# Patient Record
Sex: Male | Born: 1955 | Race: White | Hispanic: No | Marital: Single | Smoking: Current every day smoker
Health system: Southern US, Community
[De-identification: ages and names within clinical notes are randomized; demographics above are authoritative.]

## PROBLEM LIST (undated history)

## (undated) DIAGNOSIS — J449 Chronic obstructive pulmonary disease, unspecified: Secondary | ICD-10-CM

## (undated) DIAGNOSIS — I509 Heart failure, unspecified: Secondary | ICD-10-CM

## (undated) DIAGNOSIS — N189 Chronic kidney disease, unspecified: Secondary | ICD-10-CM

## (undated) DIAGNOSIS — F0390 Unspecified dementia without behavioral disturbance: Secondary | ICD-10-CM

---

## 2015-09-08 ENCOUNTER — Encounter: Payer: Self-pay | Admitting: Emergency Medicine

## 2015-09-08 ENCOUNTER — Emergency Department
Admission: EM | Admit: 2015-09-08 | Discharge: 2015-09-08 | Disposition: A | Discharge status: Home / Self Care | Payer: Self-pay | Attending: Emergency Medicine | Admitting: Emergency Medicine

## 2015-09-08 DIAGNOSIS — R451 Restlessness and agitation: Secondary | ICD-10-CM | POA: Insufficient documentation

## 2015-09-08 DIAGNOSIS — R4183 Borderline intellectual functioning: Secondary | ICD-10-CM

## 2015-09-08 DIAGNOSIS — F4325 Adjustment disorder with mixed disturbance of emotions and conduct: Secondary | ICD-10-CM

## 2015-09-08 DIAGNOSIS — F17210 Nicotine dependence, cigarettes, uncomplicated: Secondary | ICD-10-CM | POA: Insufficient documentation

## 2015-09-08 LAB — CBC
HCT: 37.8 % — ABNORMAL LOW (ref 40.0–52.0)
Hemoglobin: 13.1 g/dL (ref 13.0–18.0)
MCH: 30.9 pg (ref 26.0–34.0)
MCHC: 34.7 g/dL (ref 32.0–36.0)
MCV: 89.1 fL (ref 80.0–100.0)
PLATELETS: 251 10*3/uL (ref 150–440)
RBC: 4.24 MIL/uL — AB (ref 4.40–5.90)
RDW: 14.2 % (ref 11.5–14.5)
WBC: 7.3 10*3/uL (ref 3.8–10.6)

## 2015-09-08 LAB — ETHANOL

## 2015-09-08 LAB — COMPREHENSIVE METABOLIC PANEL
ALT: 11 U/L — AB (ref 17–63)
ANION GAP: 7 (ref 5–15)
AST: 24 U/L (ref 15–41)
Albumin: 4.3 g/dL (ref 3.5–5.0)
Alkaline Phosphatase: 96 U/L (ref 38–126)
BUN: 16 mg/dL (ref 6–20)
CHLORIDE: 99 mmol/L — AB (ref 101–111)
CO2: 29 mmol/L (ref 22–32)
CREATININE: 1.01 mg/dL (ref 0.61–1.24)
Calcium: 9.3 mg/dL (ref 8.9–10.3)
Glucose, Bld: 95 mg/dL (ref 65–99)
Potassium: 3.7 mmol/L (ref 3.5–5.1)
SODIUM: 135 mmol/L (ref 135–145)
Total Bilirubin: 0.7 mg/dL (ref 0.3–1.2)
Total Protein: 7.5 g/dL (ref 6.5–8.1)

## 2015-09-08 LAB — URINE DRUG SCREEN, QUALITATIVE (ARMC ONLY)
Amphetamines, Ur Screen: NOT DETECTED
BARBITURATES, UR SCREEN: NOT DETECTED
Benzodiazepine, Ur Scrn: NOT DETECTED
CANNABINOID 50 NG, UR ~~LOC~~: NOT DETECTED
Cocaine Metabolite,Ur ~~LOC~~: NOT DETECTED
MDMA (ECSTASY) UR SCREEN: NOT DETECTED
METHADONE SCREEN, URINE: NOT DETECTED
Opiate, Ur Screen: NOT DETECTED
Phencyclidine (PCP) Ur S: NOT DETECTED
TRICYCLIC, UR SCREEN: NOT DETECTED

## 2015-09-08 LAB — ACETAMINOPHEN LEVEL

## 2015-09-08 LAB — SALICYLATE LEVEL

## 2015-09-08 NOTE — ED Notes (Signed)
BEHAVIORAL HEALTH ROUNDING Patient sleeping: No. Patient alert and oriented: yes Behavior appropriate: Yes.  ; If no, describe:  Nutrition and fluids offered: yes Toileting and hygiene offered: Yes  Sitter present: q15 minute observations and security  monitoring Law enforcement present: Yes  ODS  
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
## 2015-09-08 NOTE — ED Provider Notes (Signed)
-----------------------------------------   4:03 PM on 09/08/2015 -----------------------------------------  Seen and evaluated by dr. Toni Amendlapacs, of psychiatry, who feels that this time the patient is not a danger to self or others and does not meet criteria for inpatient psychiatric commitment. Patient himself up for to go home. He contracts for safety, has no thoughts of hurting self or others. Given that the psychiatry service feels the patient is safe for discharge and the patient himself commits to not harming himself or others we will discharge him. The family has been contacted and they agree wholeheartedly with this disposition.  Jeanmarie PlantJames A Dail Lerew, MD 09/08/15 [phone]

## 2015-09-08 NOTE — ED Notes (Signed)
Pt given lunch tray.

## 2015-09-08 NOTE — Discharge Instructions (Signed)
If you have thoughts of hurting yourself or anyone else, return to the emergency department.

## 2015-09-08 NOTE — ED Notes (Signed)
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
## 2016-09-05 ENCOUNTER — Emergency Department
Admission: EM | Admit: 2016-09-05 | Discharge: 2016-09-05 | Disposition: A | Discharge status: Home / Self Care | Payer: Self-pay | Attending: Emergency Medicine | Admitting: Emergency Medicine

## 2016-09-05 ENCOUNTER — Encounter: Payer: Self-pay | Admitting: *Deleted

## 2016-09-05 DIAGNOSIS — F4325 Adjustment disorder with mixed disturbance of emotions and conduct: Secondary | ICD-10-CM

## 2016-09-05 DIAGNOSIS — F17210 Nicotine dependence, cigarettes, uncomplicated: Secondary | ICD-10-CM | POA: Insufficient documentation

## 2016-09-05 DIAGNOSIS — R451 Restlessness and agitation: Secondary | ICD-10-CM | POA: Insufficient documentation

## 2016-09-05 DIAGNOSIS — R4183 Borderline intellectual functioning: Secondary | ICD-10-CM

## 2016-09-05 LAB — COMPREHENSIVE METABOLIC PANEL
ALT: 14 U/L — AB (ref 17–63)
AST: 22 U/L (ref 15–41)
Albumin: 4.2 g/dL (ref 3.5–5.0)
Alkaline Phosphatase: 104 U/L (ref 38–126)
Anion gap: 9 (ref 5–15)
BUN: 11 mg/dL (ref 6–20)
CO2: 28 mmol/L (ref 22–32)
Calcium: 9.6 mg/dL (ref 8.9–10.3)
Chloride: 99 mmol/L — ABNORMAL LOW (ref 101–111)
Creatinine, Ser: 0.88 mg/dL (ref 0.61–1.24)
GFR calc Af Amer: >60 mL/min (ref >60)
Glucose, Bld: 114 mg/dL — ABNORMAL HIGH (ref 65–99)
POTASSIUM: 4.5 mmol/L (ref 3.5–5.1)
Sodium: 136 mmol/L (ref 135–145)
TOTAL PROTEIN: 7.7 g/dL (ref 6.5–8.1)
Total Bilirubin: 0.9 mg/dL (ref 0.3–1.2)

## 2016-09-05 LAB — CBC
HCT: 43.4 % (ref 40.0–52.0)
HEMOGLOBIN: 15.1 g/dL (ref 13.0–18.0)
MCH: 31.1 pg (ref 26.0–34.0)
MCHC: 34.8 g/dL (ref 32.0–36.0)
MCV: 89.3 fL (ref 80.0–100.0)
PLATELETS: 311 10*3/uL (ref 150–440)
RBC: 4.87 MIL/uL (ref 4.40–5.90)
RDW: 13.7 % (ref 11.5–14.5)
WBC: 10.6 10*3/uL (ref 3.8–10.6)

## 2016-09-05 LAB — SALICYLATE LEVEL

## 2016-09-05 LAB — ACETAMINOPHEN LEVEL

## 2016-09-05 LAB — ETHANOL: Alcohol, Ethyl (B): <5 mg/dL (ref <5)

## 2016-09-05 NOTE — ED Notes (Signed)

## 2016-09-05 NOTE — ED Notes (Signed)
Dr .Stafford in room with patient 

## 2016-09-05 NOTE — ED Notes (Signed)
Dr.Clapaac in talking with patient

## 2016-09-05 NOTE — ED Notes (Signed)
Lunch was given to patient 
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
## 2016-09-05 NOTE — ED Notes (Signed)
Intake in room talking with patient 
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
## 2016-09-05 NOTE — ED Notes (Signed)
BEHAVIORAL HEALTH ROUNDING Patient sleeping: No. Patient alert and oriented: yes Behavior appropriate: Yes.  ; If no, describe:  Nutrition and fluids offered: yes Toileting and hygiene offered: Yes  Sitter present: q15 minute observations and security  monitoring Law enforcement present: Yes  ODS  

## 2016-10-27 ENCOUNTER — Emergency Department
Admission: EM | Admit: 2016-10-27 | Discharge: 2016-10-28 | Disposition: A | Discharge status: Home / Self Care | Payer: Self-pay | Attending: Emergency Medicine | Admitting: Emergency Medicine

## 2016-10-27 ENCOUNTER — Encounter: Payer: Self-pay | Admitting: Emergency Medicine

## 2016-10-27 DIAGNOSIS — Y999 Unspecified external cause status: Secondary | ICD-10-CM | POA: Insufficient documentation

## 2016-10-27 DIAGNOSIS — F4325 Adjustment disorder with mixed disturbance of emotions and conduct: Secondary | ICD-10-CM | POA: Insufficient documentation

## 2016-10-27 DIAGNOSIS — S2231XA Fracture of one rib, right side, initial encounter for closed fracture: Secondary | ICD-10-CM | POA: Insufficient documentation

## 2016-10-27 DIAGNOSIS — F17210 Nicotine dependence, cigarettes, uncomplicated: Secondary | ICD-10-CM | POA: Insufficient documentation

## 2016-10-27 DIAGNOSIS — R4183 Borderline intellectual functioning: Secondary | ICD-10-CM | POA: Insufficient documentation

## 2016-10-27 DIAGNOSIS — Y939 Activity, unspecified: Secondary | ICD-10-CM | POA: Insufficient documentation

## 2016-10-27 DIAGNOSIS — Y929 Unspecified place or not applicable: Secondary | ICD-10-CM | POA: Insufficient documentation

## 2016-10-27 DIAGNOSIS — Z046 Encounter for general psychiatric examination, requested by authority: Secondary | ICD-10-CM | POA: Insufficient documentation

## 2016-10-27 LAB — URINE DRUG SCREEN, QUALITATIVE (ARMC ONLY)
Amphetamines, Ur Screen: NOT DETECTED
BARBITURATES, UR SCREEN: NOT DETECTED
BENZODIAZEPINE, UR SCRN: NOT DETECTED
CANNABINOID 50 NG, UR ~~LOC~~: NOT DETECTED
Cocaine Metabolite,Ur ~~LOC~~: NOT DETECTED
MDMA (Ecstasy)Ur Screen: NOT DETECTED
Methadone Scn, Ur: NOT DETECTED
Opiate, Ur Screen: NOT DETECTED
PHENCYCLIDINE (PCP) UR S: NOT DETECTED
TRICYCLIC, UR SCREEN: NOT DETECTED

## 2016-10-27 LAB — CBC
HEMATOCRIT: 40.3 % (ref 40.0–52.0)
HEMOGLOBIN: 14 g/dL (ref 13.0–18.0)
MCH: 31.6 pg (ref 26.0–34.0)
MCHC: 34.8 g/dL (ref 32.0–36.0)
MCV: 90.8 fL (ref 80.0–100.0)
Platelets: 251 10*3/uL (ref 150–440)
RBC: 4.44 MIL/uL (ref 4.40–5.90)
RDW: 13.4 % (ref 11.5–14.5)
WBC: 10 10*3/uL (ref 3.8–10.6)

## 2016-10-27 LAB — COMPREHENSIVE METABOLIC PANEL
ALK PHOS: 104 U/L (ref 38–126)
ALT: 36 U/L (ref 17–63)
AST: 55 U/L — AB (ref 15–41)
Albumin: 4.4 g/dL (ref 3.5–5.0)
Anion gap: 8 (ref 5–15)
BILIRUBIN TOTAL: 0.8 mg/dL (ref 0.3–1.2)
BUN: 13 mg/dL (ref 6–20)
CALCIUM: 9.4 mg/dL (ref 8.9–10.3)
CO2: 27 mmol/L (ref 22–32)
CREATININE: 0.98 mg/dL (ref 0.61–1.24)
Chloride: 95 mmol/L — ABNORMAL LOW (ref 101–111)
GFR calc Af Amer: >60 mL/min (ref >60)
Glucose, Bld: 86 mg/dL (ref 65–99)
POTASSIUM: 4 mmol/L (ref 3.5–5.1)
Sodium: 130 mmol/L — ABNORMAL LOW (ref 135–145)
TOTAL PROTEIN: 7.7 g/dL (ref 6.5–8.1)

## 2016-10-27 LAB — URINALYSIS, ROUTINE W REFLEX MICROSCOPIC
BILIRUBIN URINE: NEGATIVE
Glucose, UA: NEGATIVE mg/dL
HGB URINE DIPSTICK: NEGATIVE
KETONES UR: NEGATIVE mg/dL
Leukocytes, UA: NEGATIVE
NITRITE: NEGATIVE
Protein, ur: NEGATIVE mg/dL
Specific Gravity, Urine: 1.008 (ref 1.005–1.030)
pH: 5 (ref 5.0–8.0)

## 2016-10-27 LAB — ETHANOL

## 2016-10-27 NOTE — ED Notes (Addendum)
Dressed out by this RN 1 bag of belongings
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
## 2016-10-28 ENCOUNTER — Emergency Department: Payer: Self-pay

## 2016-10-28 MED ORDER — IBUPROFEN 800 MG PO TABS: 800.0000 mg | ORAL_TABLET | Freq: Once | ORAL | Status: DC

## 2016-10-28 MED ORDER — KETOROLAC TROMETHAMINE 30 MG/ML IJ SOLN
30.0000 mg | Freq: Once | INTRAMUSCULAR | Status: AC
  Administered 2016-10-28: 30 mg via INTRAMUSCULAR
  Filled 2016-10-28: qty 1

## 2016-10-28 MED ORDER — IBUPROFEN 800 MG PO TABS
ORAL_TABLET | ORAL | Status: AC
  Filled 2016-10-28: qty 1

## 2016-10-28 MED ORDER — LORAZEPAM 2 MG/ML IJ SOLN
2.0000 mg | Freq: Once | INTRAMUSCULAR | Status: DC
  Filled 2016-10-28: qty 1

## 2016-10-28 NOTE — ED Notes (Signed)
RN made 2nd attempt to contact pt's brother. Pt's brother will pick him up.
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
## 2016-10-28 NOTE — ED Notes (Signed)
Pt sleeping in bed, ativan IM order held

## 2016-10-28 NOTE — ED Notes (Signed)
Pt given sprite. Pt asked this RN when he could "leave this place". RN informed pt that the telepsych MD would report to our MD.

## 2016-10-28 NOTE — ED Notes (Signed)
Pt agitiated, unable to orient to plan to see Dr Renaldo Fiddlerlapecs and IVC order, declining any sort of comfort measures, sitting upright in bed refuses to have HOB lowered, Dr Dolores FrameSung notified, orders recieved

## 2016-10-28 NOTE — Discharge Instructions (Signed)
Please seek medical attention for any high fevers, chest pain, shortness of breath, change in behavior, persistent vomiting, bloody stool or any other new or concerning symptoms.  

## 2016-10-28 NOTE — ED Notes (Signed)
This RN attempted to contact Pt's bother without success. Pt placed in lobby where he will attempt to contact his brother to pick him up.

## 2016-10-28 NOTE — ED Notes (Signed)
Unable to move pt to BHU only rooms 6,7 and 8 are closed due to smell
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
## 2016-10-28 NOTE — ED Notes (Signed)
Pt verbalized understanding of discharge instructions. NAD at this time. 

## 2016-10-28 NOTE — ED Provider Notes (Signed)
Patient was seen by specialist on call. They do feel he is safe for discharge home. On my exam patient is calm. He denies any SI or HI. Will have the nurse give patient an incentive spirometer.   Phineas SemenGoodman, Doretta Remmert, MD 10/28/16 [phone]

## 2016-10-28 NOTE — Clinical Social Work Note (Signed)
CSW consulted for "possible placement." Patient is independent in all ADL's currently and does not meet criteria for placement in nursing home and he does not have insurance to pay for placement in assisted living or group home. Patient is awaiting an MD psychiatric evaluation and assessment.  York SpanielMonica Lavon Vaughan MSW,LCSW [phone]

## 2016-10-28 NOTE — ED Notes (Addendum)
Pt in hallway staring at staff, this RN asked pt if he needed anything and patient turned his head away from RN.  Pt asked when he was going to see psychiatrist. RN explained that he would be seeing the psych MD shortly but I could not give exact time. Pt stated "I've been here all damn night."

## 2016-12-18 ENCOUNTER — Encounter: Payer: Self-pay | Admitting: Emergency Medicine

## 2016-12-18 ENCOUNTER — Emergency Department
Admission: EM | Admit: 2016-12-18 | Discharge: 2016-12-21 | Disposition: A | Discharge status: Home / Self Care | Payer: Self-pay | Attending: Emergency Medicine | Admitting: Emergency Medicine

## 2016-12-18 DIAGNOSIS — F4325 Adjustment disorder with mixed disturbance of emotions and conduct: Secondary | ICD-10-CM | POA: Diagnosis present

## 2016-12-18 DIAGNOSIS — R4183 Borderline intellectual functioning: Secondary | ICD-10-CM

## 2016-12-18 DIAGNOSIS — R4689 Other symptoms and signs involving appearance and behavior: Secondary | ICD-10-CM

## 2016-12-18 DIAGNOSIS — F17210 Nicotine dependence, cigarettes, uncomplicated: Secondary | ICD-10-CM | POA: Insufficient documentation

## 2016-12-18 DIAGNOSIS — F919 Conduct disorder, unspecified: Secondary | ICD-10-CM | POA: Insufficient documentation

## 2016-12-18 LAB — COMPREHENSIVE METABOLIC PANEL
ALK PHOS: 106 U/L (ref 38–126)
ALT: 21 U/L (ref 17–63)
AST: 31 U/L (ref 15–41)
Albumin: 4.2 g/dL (ref 3.5–5.0)
Anion gap: 8 (ref 5–15)
BUN: 31 mg/dL — ABNORMAL HIGH (ref 6–20)
CALCIUM: 9.2 mg/dL (ref 8.9–10.3)
CO2: 25 mmol/L (ref 22–32)
Chloride: 105 mmol/L (ref 101–111)
Creatinine, Ser: 1.47 mg/dL — ABNORMAL HIGH (ref 0.61–1.24)
GFR, EST AFRICAN AMERICAN: 58 mL/min — AB (ref >60)
GFR, EST NON AFRICAN AMERICAN: 50 mL/min — AB (ref >60)
Glucose, Bld: 77 mg/dL (ref 65–99)
Potassium: 4.1 mmol/L (ref 3.5–5.1)
Sodium: 138 mmol/L (ref 135–145)
TOTAL PROTEIN: 7.2 g/dL (ref 6.5–8.1)
Total Bilirubin: 0.6 mg/dL (ref 0.3–1.2)

## 2016-12-18 LAB — ACETAMINOPHEN LEVEL: Acetaminophen (Tylenol), Serum: <10 ug/mL — ABNORMAL LOW (ref 10–30)

## 2016-12-18 LAB — CBC
HCT: 37.8 % — ABNORMAL LOW (ref 40.0–52.0)
Hemoglobin: 13.2 g/dL (ref 13.0–18.0)
MCH: 31.9 pg (ref 26.0–34.0)
MCHC: 34.9 g/dL (ref 32.0–36.0)
MCV: 91.4 fL (ref 80.0–100.0)
PLATELETS: 245 10*3/uL (ref 150–440)
RBC: 4.13 MIL/uL — ABNORMAL LOW (ref 4.40–5.90)
RDW: 14.2 % (ref 11.5–14.5)
WBC: 9.3 10*3/uL (ref 3.8–10.6)

## 2016-12-18 LAB — SALICYLATE LEVEL

## 2016-12-18 LAB — ETHANOL

## 2016-12-18 MED ORDER — QUETIAPINE FUMARATE 25 MG PO TABS
50.0000 mg | ORAL_TABLET | Freq: Four times a day (QID) | ORAL | Status: DC | PRN
  Administered 2016-12-18 – 2016-12-20 (×2): 50 mg via ORAL
  Filled 2016-12-18 (×2): qty 2
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
## 2016-12-18 NOTE — ED Notes (Signed)

## 2016-12-18 NOTE — ED Notes (Signed)
Received a call from someone stating they are with APS/dss she is verbalizing that the pt does not have anywhere to go and that he is a danger to go back into society  - since she is not his legal guardian I could not give her any information/update  I asked her to call social work - Morrie Sheldonshley to give her a heads up about his placement upon discharge    IVC remains in place per psychiatrist consult note today
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
## 2016-12-18 NOTE — ED Notes (Signed)
Pt observed lying in bed - watching TV   Pt visualized with NAD  No verbalized needs or concerns at this time  Continue to monitor 

## 2016-12-18 NOTE — BH Assessment (Signed)
Tele Assessment Note   Paul Vaughan is an 61 y.o. male presenting involuntarily for assessment. Pt reports he was transported to ED by law enforcement. Pt states he does not know why law enforcement brought him in. Pt states "I had a lighter in my hand and they thought it was a gun". Pt presents as irritable and with profane language. Pt denies SI and h/o suicide attempt. Pt states he is homeless as of today. Pt reports his brother is the reason why he is homeless. Pt states " and he (brother) said I  Can't live with him and his family. He jealous of me and I don't know why. I don't even thing he's my real fucking brother".  Pt denies HI and thoughts of harming others. Pt denies hallucinations and states "No. You think I'm crazy? The doctor said I wasn't crazy".   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 No pertinent past medical history.  History reviewed. No pertinent surgical history.  Family History: No family history on file.  Social History:  reports that he has been smoking Cigarettes.  He has been smoking about 1.00 pack per day. He has never used smokeless tobacco. He reports that he does not drink alcohol or use drugs.  Additional Social History:  Alcohol / Drug Use Pain Medications: Pt denies abuse. Prescriptions: Pt denies abuse. Over the Counter: Pt denies abuse. History of alcohol / drug use?: No history of alcohol / drug abuse  CIWA: CIWA-Ar BP: 116/68 Pulse Rate: 77 COWS:    PATIENT STRENGTHS: (choose at least two) Average or above average intelligence General fund of knowledge  Allergies:  Allergies  Allergen Reactions  . Penicillins Rash    .Has patient had a PCN reaction causing immediate rash, facial/tongue/throat swelling, SOB or lightheadedness with hypotension: Unknown Has patient had a PCN reaction causing severe rash involving mucus membranes or skin necrosis: Unknown Has patient  had a PCN reaction that required hospitalization: Unknown Has patient had a PCN reaction occurring within the last 10 years: Unknown If all of the above answers are "NO", then may proceed with Cephalosporin use.   . Sulfa Antibiotics Rash    Home Medications:  (Not in a hospital admission)  OB/GYN Status:  No LMP for male patient.  General Assessment Data Location of Assessment: [facility] ED TTS Assessment: In system Is this a Tele or Face-to-Face Assessment?: Face-to-Face Is this an Initial Assessment or a Re-assessment for this encounter?: Initial Assessment Marital status: Single Living Arrangements: Other (Comment) (Pt reports he is homeless as of today) Can pt return to current living arrangement?: Yes Admission Status: Involuntary Is patient capable of signing voluntary admission?: No Referral Source: Other Mudlogger(law enforcement) Insurance type: Horticulturist, [employer]     Crisis Care Plan Living Arrangements: Other (Comment) (Pt reports he is homeless as of today) Name of Psychiatrist: None Name of Therapist: None  Education Status Is patient currently in school?: No Name of school: Not Reported  Risk to self with the past 6 months Suicidal Ideation: No Has patient been a risk to self within the past 6 months prior to admission? : No Suicidal Intent: No Has patient had any suicidal intent within the past 6 months prior to admission? : No Is patient at risk for suicide?: No Suicidal Plan?: No Has patient had any suicidal plan within the past 6 months prior to admission? : No Access to Means: No What has been your use of  drugs/alcohol within the last 12 months?: Pt denies drug/alcohol use Previous Attempts/Gestures: No Other Self Harm Risks: None Reported Triggers for Past Attempts: None known Intentional Self Injurious Behavior: None Family Suicide History: No Recent stressful life event(s): Other (Comment), Conflict (Comment) (conflict w/ brother/ homeless) Persecutory  voices/beliefs?: No Depression: No Substance abuse history and/or treatment for substance abuse?: No (Pt denies) Suicide prevention information given to non-admitted patients: Not applicable  Risk to Others within the past 6 months Homicidal Ideation: No Does patient have any lifetime risk of violence toward others beyond the six months prior to admission? : No Thoughts of Harm to Others: No Current Homicidal Intent: No Current Homicidal Plan: No Access to Homicidal Means: No History of harm to others?: No Assessment of Violence: None Noted Does patient have access to weapons?: No Criminal Charges Pending?: No Does patient have a court date: No Is patient on probation?: No  Psychosis Hallucinations: None noted Delusions: None noted  Mental Status Report Appearance/Hygiene: In scrubs Eye Contact: Fair Motor Activity: Unremarkable Speech: Logical/coherent Level of Consciousness: Irritable, Alert Mood: Irritable Affect: Irritable Anxiety Level: Minimal Thought Processes: Relevant Judgement: Partial Orientation: Person, Place, Time, Situation Obsessive Compulsive Thoughts/Behaviors: Minimal  Cognitive Functioning Concentration: Good Memory: Recent Intact, Remote Intact IQ: Average Insight: Poor Impulse Control: Poor Appetite:  (Not Reported) Sleep: No Change Total Hours of Sleep:  (Not Reported) Vegetative Symptoms: None  ADLScreening [facility] Assessment Services) Patient's cognitive ability adequate to safely complete daily activities?: Yes Patient able to express need for assistance with ADLs?: Yes Independently performs ADLs?: Yes (appropriate for developmental age)  Prior Inpatient Therapy Prior Inpatient Therapy: No  Prior Outpatient Therapy Prior Outpatient Therapy: No Does patient have an ACCT team?: No Does patient have Intensive In-House Services?  : No Does patient have [facility] services? : No Does patient have P4CC services?: No  ADL Screening  (condition at time of admission) Patient's cognitive ability adequate to safely complete daily activities?: Yes Is the patient deaf or have difficulty hearing?: No Does the patient have difficulty seeing, even when wearing glasses/contacts?: No Does the patient have difficulty concentrating, remembering, or making decisions?: Yes Patient able to express need for assistance with ADLs?: Yes Does the patient have difficulty dressing or bathing?: No Independently performs ADLs?: Yes (appropriate for developmental age) Does the patient have difficulty walking or climbing stairs?: No Weakness of Legs: None Weakness of Arms/Hands: None  Home Assistive Devices/Equipment Home Assistive Devices/Equipment: None  Therapy Consults (therapy consults require a physician order) PT Evaluation Needed: No OT Evalulation Needed: No SLP Evaluation Needed: No Abuse/Neglect Assessment (Assessment to be complete while patient is alone) Physical Abuse: Denies Verbal Abuse: Denies Sexual Abuse: Denies Exploitation of patient/patient's resources: Denies Self-Neglect: Denies Values / Beliefs Cultural Requests During Hospitalization: None Spiritual Requests During Hospitalization: None Consults Spiritual Care Consult Needed: No Social Work Consult Needed: No Merchant navy officer (For Healthcare) Does Patient Have a Medical Advance Directive?: No Would patient like information on creating a medical advance directive?: No - Patient declined    Additional Information 1:1 In Past 12 Months?: No CIRT Risk: No Elopement Risk: No Does patient have medical clearance?: No     Disposition:  Disposition Initial Assessment Completed for this Encounter: Yes Disposition of Patient: Other dispositions Other disposition(s): Other (Comment) (Pending Psychiatric Recommendation)  Mansi Tokar J [place] 12/18/2016 6:39 PM

## 2016-12-18 NOTE — ED Notes (Signed)
BEHAVIORAL HEALTH ROUNDING Patient sleeping: No. Patient alert and oriented: yes Behavior appropriate: Yes.  ; If no, describe:  Nutrition and fluids offered: yes Toileting and hygiene offered: Yes  Sitter present: q15 minute observations and security  monitoring Law enforcement present: Yes  ODS  

## 2016-12-18 NOTE — ED Notes (Signed)
Pt given warm blanket, supper tray and extra juice.

## 2016-12-19 NOTE — ED Notes (Signed)
BEHAVIORAL HEALTH ROUNDING Patient sleeping: Yes.   Patient alert and oriented: eyes closed  Appears to be asleep Behavior appropriate: Yes.  ; If no, describe:  Nutrition and fluids offered: Yes  Toileting and hygiene offered: sleeping Sitter present: q 15 minute observations and security monitoring Law enforcement present: yes  ODS 

## 2016-12-19 NOTE — ED Notes (Signed)
ED  Is the patient under IVC or is there intent for IVC: Yes.   Is the patient medically cleared: Yes.   Is there vacancy in the ED BHU: Yes.   Is the population mix appropriate for patient: Yes.   Is the patient awaiting placement in inpatient or outpatient setting:  Has the patient had a psychiatric consult:  pending Survey of unit performed for contraband, proper placement and condition of furniture, tampering with fixtures in bathroom, shower, and each patient room: Yes.  ; Findings:  APPEARANCE/BEHAVIOR Calm and cooperative NEURO ASSESSMENT Orientation: oriented x4  Denies pain Hallucinations: No.None noted (Hallucinations) Speech: Normal Gait: normal RESPIRATORY ASSESSMENT Even  Unlabored respirations  CARDIOVASCULAR ASSESSMENT Pulses equal   regular rate  Skin warm and dry   GASTROINTESTINAL ASSESSMENT no GI complaint EXTREMITIES Full ROM  PLAN OF CARE Provide calm/safe environment. Vital signs assessed twice daily. ED BHU Assessment once each 12-hour shift. Collaborate with TTS when available  or as condition indicates. Assure the ED provider has rounded once each shift. Provide and encourage hygiene. Provide redirection as needed. Assess for escalating behavior; address immediately and inform ED provider.  Assess family dynamic and appropriateness for visitation as needed: Yes.  ; If necessary, describe findings:  Educate the patient/family about BHU procedures/visitation: Yes.  ; If necessary, describe findings:   

## 2016-12-19 NOTE — Consult Note (Signed)
[place] [facility] Consult   Reason for Consult:  Consult for 61 year old man with a history of developmental disability brought in by police for agitated behavior Referring Physician:  Cinda Quest Patient Identification: Hulon Ferron MRN:  [id number] Principal Diagnosis: Adjustment disorder with mixed disturbance of emotions and conduct Diagnosis:   Patient Active Problem List   Diagnosis Date Noted  . 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 Adjustment disorder with mixed disturbance of emotions and conduct [F43.25] 09/08/2015    Total Time spent with patient: 1 hour  Subjective:   Cejay Cambre is a 60 y.o. male patient admitted with "I need a damn. Job".  Follow-up note for this 61 year old man with a history of developmental disability. Since yesterday we have been contacted by adult protective services who report that they are involved in trying to seek guardianship. It's still not clear to me why he no longer has his previous residence. I have spoken with social work and TTS and we are still trying to track down information from his brother. Patient has despite his irritability not been any trouble since yesterday. He has been cooperative in the emergency room and not aggressive. On reassessment today he still talks about how he wants me to give him a job but he is less intrusive about it. Affect blunted and dysphoric. Not obviously psychotic. Eating well. Seems to be physically stable.  HPI:  Patient seen chart reviewed. Patient known from previous encounters. 61 year old man brought to the emergency room under IVC by police. Picked up for agitated behavior in public. Reported to of been making threatening gestures at cars and walking out into traffic. On interview the patient tells me that the object he was holding in his hand is a lighter used for lighting charcoal. Denies that he was trying to hurt anyone. Admits that he was walking around in the streets  but denies that he was trying to get hurt. Patient is rather emotionally agitated today even more than I have seen him in the past. Hard to get much of a straight story out of him. He tells me that he no longer has any place to live because the place he has been staying has been condemned. He tells me he has not been sleeping well for days. Denies that he's been on any medicine. Denies drug or alcohol abuse. Patient repeats over and over again and then he needs to be given a job. 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 Aims a little more disorganized than I remember him being in the past. He is covered with green paint which she has used to paint his fingernails. Looks a little bit rougher then I remember him being before.  Social history: Patient does have a legal guardian who is his brother. Last time we interacted with this patient it was confirmed that he lived independently but that his brother took care of his finances for him. Patient is now stating that he no longer has a place to live.  Medical history: He is underweight but he says that he has not been losing or gaining weight. Denies any medical issues. No known medical problems from the past.  Substance abuse history: Denies alcohol or drug abuse does not have a past history of any substance abuse  Past Psychiatric History: Patient has been seen in the past in the emergency room and his primary diagnosis had been developmental disability. He has  a known tendency to walk around in public acting strangely which has gotten him in trouble with the police before. We have not admitted him to the psychiatric hospital in the past for thought that he met commitment criteria. Had not been previously diagnosed with an Axis I psychiatric disorder. Currently seems to be a little more disorganized and bizarre than usual.  Risk to Self: Suicidal Ideation: No Suicidal Intent: No Is patient at risk  for suicide?: No Suicidal Plan?: No Access to Means: No What has been your use of drugs/alcohol within the last 12 months?: Pt denies drug/alcohol use Other Self Harm Risks: None Reported Triggers for Past Attempts: None known Intentional Self Injurious Behavior: None Risk to Others: Homicidal Ideation: No Thoughts of Harm to Others: No Current Homicidal Intent: No Current Homicidal Plan: No Access to Homicidal Means: No History of harm to others?: No Assessment of Violence: None Noted Does patient have access to weapons?: No Criminal Charges Pending?: No Does patient have a court date: No Prior Inpatient Therapy: Prior Inpatient Therapy: No Prior Outpatient Therapy: Prior Outpatient Therapy: No Does patient have an ACCT team?: No Does patient have Intensive In-House Services?  : No Does patient have [facility] services? : No Does patient have P4CC services?: No  Past Medical History: History reviewed. No pertinent past medical history. History reviewed. No pertinent surgical history. Family History: No family history on file. Family Psychiatric  History: Unknown Social History:  History  Alcohol Use No     History  Drug Use No    Social History   Social History  . Marital status: Single    Spouse name: N/A  . Number of children: N/A  . Years of education: N/A   Social History Main Topics  . Smoking status: Current Every Day Smoker    Packs/day: 1.00    Types: Cigarettes  . Smokeless tobacco: Never Used  . Alcohol use No  . Drug use: No  . Sexual activity: Not Asked   Other Topics Concern  . None   Social History Narrative  . None   Additional Social History:    Allergies:   Allergies  Allergen Reactions  . Penicillins Rash    .Has patient had a PCN reaction causing immediate rash, facial/tongue/throat swelling, SOB or lightheadedness with hypotension: Unknown Has patient had a PCN reaction causing severe rash involving mucus membranes or skin necrosis:  Unknown Has patient had a PCN reaction that required hospitalization: Unknown Has patient had a PCN reaction occurring within the last 10 years: Unknown If all of the above answers are "NO", then may proceed with Cephalosporin use.   . Sulfa Antibiotics Rash    Labs:  Results for orders placed or performed during the hospital encounter of 12/18/16 (from the past 48 hour(s))  Comprehensive metabolic panel     Status: Abnormal   Collection Time: 12/18/16  4:40 PM  Result Value Ref Range   Sodium 138 135 - 145 mmol/L   Potassium 4.1 3.5 - 5.1 mmol/L   Chloride 105 101 - 111 mmol/L   CO2 25 22 - 32 mmol/L   Glucose, Bld 77 65 - 99 mg/dL   BUN 31 (H) 6 - 20 mg/dL   Creatinine, Ser 1.47 (H) 0.61 - 1.24 mg/dL   Calcium 9.2 8.9 - 10.3 mg/dL   Total Protein 7.2 6.5 - 8.1 g/dL   Albumin 4.2 3.5 - 5.0 g/dL   AST 31 15 - 41 U/L   ALT 21 17 -  63 U/L   Alkaline Phosphatase 106 38 - 126 U/L   Total Bilirubin 0.6 0.3 - 1.2 mg/dL   GFR calc non Af Amer 50 (L) >60 mL/min   GFR calc Af Amer 58 (L) >60 mL/min    Comment: (NOTE) The eGFR has been calculated using the CKD EPI equation. This calculation has not been validated in all clinical situations. eGFR's persistently <60 mL/min signify possible Chronic Kidney Disease.    Anion gap 8 5 - 15  Ethanol     Status: None   Collection Time: 12/18/16  4:40 PM  Result Value Ref Range   Alcohol, Ethyl (B) <5 <5 mg/dL    Comment:        LOWEST DETECTABLE LIMIT FOR SERUM ALCOHOL IS 5 mg/dL FOR MEDICAL PURPOSES ONLY   Salicylate level     Status: None   Collection Time: 12/18/16  4:40 PM  Result Value Ref Range   Salicylate Lvl <3.5 2.8 - 30.0 mg/dL  Acetaminophen level     Status: Abnormal   Collection Time: 12/18/16  4:40 PM  Result Value Ref Range   Acetaminophen (Tylenol), Serum <10 (L) 10 - 30 ug/mL    Comment:        THERAPEUTIC CONCENTRATIONS VARY SIGNIFICANTLY. A RANGE OF 10-30 ug/mL MAY BE AN EFFECTIVE CONCENTRATION FOR MANY  PATIENTS. HOWEVER, SOME ARE BEST TREATED AT CONCENTRATIONS OUTSIDE THIS RANGE. ACETAMINOPHEN CONCENTRATIONS >150 ug/mL AT 4 HOURS AFTER INGESTION AND >50 ug/mL AT 12 HOURS AFTER INGESTION ARE OFTEN ASSOCIATED WITH TOXIC REACTIONS.   cbc     Status: Abnormal   Collection Time: 12/18/16  4:40 PM  Result Value Ref Range   WBC 9.3 3.8 - 10.6 K/uL   RBC 4.13 (L) 4.40 - 5.90 MIL/uL   Hemoglobin 13.2 13.0 - 18.0 g/dL   HCT 37.8 (L) 40.0 - 52.0 %   MCV 91.4 80.0 - 100.0 fL   MCH 31.9 26.0 - 34.0 pg   MCHC 34.9 32.0 - 36.0 g/dL   RDW 14.2 11.5 - 14.5 %   Platelets 245 150 - 440 K/uL    Current Facility-Administered Medications  Medication Dose Route Frequency Provider Last Rate Last Dose  . QUEtiapine (SEROQUEL) tablet 50 mg  50 mg Oral Q6H PRN Martie Fulgham, Madie Reno, MD   50 mg at 12/18/16 2052   No current outpatient prescriptions on file.    Musculoskeletal: Strength & Muscle Tone: within normal limits Gait & Station: normal Patient leans: N/A  Psychiatric Specialty Exam: Physical Exam  Nursing note and vitals reviewed. Constitutional: He appears well-developed and well-nourished.  HENT:  Head: Normocephalic and atraumatic.  Eyes: Pupils are equal, round, and reactive to light. Conjunctivae are normal.  Neck: Normal range of motion.  Cardiovascular: Regular rhythm and normal heart sounds.   Respiratory: Effort normal. No respiratory distress.  GI: Soft.  Musculoskeletal: Normal range of motion.  Neurological: He is alert.  Skin: Skin is warm and dry.  Psychiatric: His affect is not angry and not labile. His speech is tangential. He is not agitated. Cognition and memory are impaired. He expresses impulsivity and inappropriate judgment. He expresses no homicidal and no suicidal ideation. He exhibits abnormal recent memory.    Review of Systems  Constitutional: Negative.   HENT: Negative.   Eyes: Negative.   Respiratory: Negative.   Cardiovascular: Negative.    Gastrointestinal: Negative.   Musculoskeletal: Negative.   Skin: Negative.   Neurological: Negative.   Psychiatric/Behavioral: Negative for depression, hallucinations, memory loss,  substance abuse and suicidal ideas. The patient has insomnia. The patient is not nervous/anxious.     Blood pressure 109/62, pulse (!) 103, temperature (!) 97.5 F (36.4 C), temperature source Oral, resp. rate 18, height 5' 9"  (1.753 m), weight 59 kg (130 lb), SpO2 97 %.Body mass index is 19.2 kg/m.  General Appearance: Disheveled  Eye Contact:  Fair  Speech:  Slow  Volume:  Normal  Mood:  Euthymic  Affect:  Constricted  Thought Process:  Disorganized  Orientation:  Full (Time, Place, and Person)  Thought Content:  Illogical, Rumination and Tangential  Suicidal Thoughts:  No  Homicidal Thoughts:  No  Memory:  Immediate;   Fair Recent;   Fair Remote;   Fair  Judgement:  Impaired  Insight:  Shallow  Psychomotor Activity:  Normal  Concentration:  Concentration: Poor  Recall:  Poor  Fund of Knowledge:  Poor  Language:  Poor  Akathisia:  No  Handed:  Right  AIMS (if indicated):     Assets:  Financial Resources/Insurance Social Support  ADL's:  Impaired  Cognition:  Impaired,  Mild  Sleep:        Treatment Plan Summary: Medication management and Plan Patient has calm down. He is eating normally sleeping normally. Vitals are stable. Patient mainly needs placement at this point. Unclear what our options will be. Social work is working with Adult Scientist, forensic to try to sort out what the situation is for him. No specific indication for any new medicine. Continue to follow-up as needed. It had been stated to me that Adult Protective Services said that we would not be able to hold him if he did not want to stay here. Right now he is not demanding to go anywhere. Given his inability to care for himself I don't think it would be safer reasonable to let him go out onto the street where he would  inevitably be back in trouble with the law in a short time.. We will work on trying to get appropriate care arranged for him.  Disposition: Supportive therapy provided about ongoing stressors.  Alethia Berthold, MD 12/19/2016 2:34 PM

## 2016-12-19 NOTE — [place] FL2 (Signed)
  [place] MEDICAID FL2 LEVEL OF CARE SCREENING TOOL     IDENTIFICATION  Patient Name: Paul Vaughan Birthdate: 08/24/1955 Sex: male Admission Date (Current Location): 12/18/2016  [place]   Facility and Address:  [facility], [address], [place], [place] [address]      Provider Number: [id number]  Attending Physician Name and Address:  No att. providers found  Relative Name and Phone Number:       Current Level of Care: Hospital Recommended Level of Care: Family Care Home, Other (Comment) (Group Home) Prior Approval Number:    Date Approved/Denied:   PASRR Number:    Discharge Plan: Other (Comment) (Family Care Home; Group Home)    Current Diagnoses: Patient Active Problem List   Diagnosis Date Noted  . 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    Orientation RESPIRATION BLADDER Height & Weight     Self, Place  Normal Continent Weight: 130 lb (59 kg) Height:  5\' 9"  (175.3 cm)  BEHAVIORAL SYMPTOMS/MOOD NEUROLOGICAL BOWEL NUTRITION STATUS      Continent Diet  AMBULATORY STATUS COMMUNICATION OF NEEDS Skin   Independent Verbally Normal                       Personal Care Assistance Level of Assistance  Bathing, Feeding, Dressing Bathing Assistance: Independent Feeding assistance: Independent Dressing Assistance: Independent     Functional Limitations Info  Sight, Speech, Hearing Sight Info: Adequate Hearing Info: Adequate Speech Info: Adequate    SPECIAL CARE FACTORS FREQUENCY                       Contractures Contractures Info: Not present    Additional Factors Info  Allergies, Psychotropic   Allergies Info: Penicillins, Sulfa Antibiotics Psychotropic Info: QUEtiapine (SEROQUEL) tablet 50 mg         Current Medications (12/19/2016):  This is the current hospital active medication  list Current Facility-Administered Medications  Medication Dose Route Frequency Provider Last Rate Last Dose  . QUEtiapine (SEROQUEL) tablet 50 mg  50 mg Oral Q6H PRN Clapacs, Jackquline DenmarkJohn T, MD   50 mg at 12/18/16 2052   No current outpatient prescriptions on file.     Discharge Medications: Please see discharge summary for a list of discharge medications.  Relevant Imaging Results:  Relevant Lab Results:   Additional Information SS #[id number]  Lew DawesAshley N Bralynn Donado, LCSW

## 2016-12-19 NOTE — Progress Notes (Addendum)
FL-2 sent to group homes with Patient's permission and agreement. PASRR submitted and under manual review. CSW continues to follow.   Enos FlingAshley Yotam Rhine, MSW, LCSW [facility] Clinical Social Worker [phone]

## 2016-12-19 NOTE — ED Notes (Signed)
Food tray left in room for the patient, he is currently sleeping.

## 2016-12-19 NOTE — ED Notes (Signed)
BEHAVIORAL HEALTH ROUNDING Patient sleeping: No. Patient alert and oriented: yes Behavior appropriate: Yes.  ; If no, describe:  Nutrition and fluids offered: yes Toileting and hygiene offered: Yes  Sitter present: q15 minute observations and security  monitoring Law enforcement present: Yes  ODS  

## 2016-12-19 NOTE — ED Notes (Signed)

## 2016-12-19 NOTE — ED Notes (Signed)
Patient observed lying in bed with eyes closed  Even, unlabored respirations observed   NAD pt appears to be sleeping  I will continue to monitor along with every 15 minute visual observations and ongoing security monitoring    
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
## 2016-12-19 NOTE — ED Notes (Signed)
PT IVC/ PENDING PLACEMENT  

## 2016-12-19 NOTE — ED Notes (Addendum)
BEHAVIORAL HEALTH ROUNDING Patient sleeping: Yes.   Patient alert and oriented: eyes closed  Appears to be asleep Behavior appropriate: Yes.  ; If no, describe:  Nutrition and fluids offered: Yes  Toileting and hygiene offered: sleeping Sitter present: q 15 minute observations and security monitoring Law enforcement present: yes  ODS 

## 2016-12-19 NOTE — ED Provider Notes (Signed)
-----------------------------------------   3:51 AM on 12/19/2016 -----------------------------------------   Blood pressure 131/70, pulse 65, temperature 98.2 F (36.8 C), temperature source Oral, resp. rate 16, height 5\' 9"  (1.753 m), weight 59 kg (130 lb), SpO2 99 %.  The patient had no acute events since last update.  Calm and cooperative at this time.  Disposition is pending Psychiatry/Behavioral Medicine team recommendations.     Willy Eddyobinson, Fredrica Capano, MD 12/19/16 [phone]

## 2016-12-19 NOTE — ED Notes (Signed)
Pt breakfast tray placed next to bed. Pt sleeping

## 2016-12-20 LAB — URINE DRUG SCREEN, QUALITATIVE (ARMC ONLY)
Amphetamines, Ur Screen: NOT DETECTED
BARBITURATES, UR SCREEN: NOT DETECTED
Benzodiazepine, Ur Scrn: NOT DETECTED
CANNABINOID 50 NG, UR ~~LOC~~: NOT DETECTED
COCAINE METABOLITE, UR ~~LOC~~: NOT DETECTED
MDMA (Ecstasy)Ur Screen: NOT DETECTED
METHADONE SCREEN, URINE: NOT DETECTED
OPIATE, UR SCREEN: NOT DETECTED
Phencyclidine (PCP) Ur S: NOT DETECTED
Tricyclic, Ur Screen: NOT DETECTED

## 2016-12-20 NOTE — ED Notes (Signed)
Patient in room resting, in no distress. Meal tray given to patient

## 2016-12-20 NOTE — Progress Notes (Signed)
CSW has submitted clinicals to [place] [facility] PASRR. Hand MUST to send screening representative to meet with patient. CSW received t/c from [facility] who reports that they would like to meet with Patient to determine appropriateness for their facility. [facility] owner hopes to be by at 5pm this afternoon but will call should this time change. CSW has notified Charity fundraiserN.    Enos FlingAshley Kaynan Klonowski, MSW, LCSW [facility] Clinical Social Worker [phone]

## 2016-12-20 NOTE — ED Notes (Signed)
Patient was given Ginger ale

## 2016-12-20 NOTE — Progress Notes (Signed)
CSW received return phone call from Janee MornBill Coleman w/ [facility]. Per Annette StableBill, he and Yahooene Majors will not be able to come and meet Patient on today at 5pm but will be here to assess Patient on tomorrow. CSW provided update to [place] Melvin, with [facility]. CSW continues to follow.    Enos FlingAshley Elvie Palomo, MSW, LCSW [facility] Clinical Social Worker [phone]

## 2016-12-20 NOTE — ED Notes (Signed)
Patient is resting comfortably in bed, eyes open in no distress.

## 2016-12-20 NOTE — ED Notes (Signed)
Patient given grape juice.

## 2016-12-20 NOTE — ED Provider Notes (Signed)
-----------------------------------------   8:11 AM on 12/20/2016 -----------------------------------------   BP (!) 115/56 (BP Location: Left Arm)   Pulse 63   Temp 97.9 F (36.6 C) (Oral)   Resp 18   Ht 5\' 9"  (1.753 m)   Wt 59 kg (130 lb)   SpO2 96%   BMI 19.20 kg/m   No acute events since last update.    Disposition is pending per Psychiatry/Behavioral Medicine team recommendations.     Phineas SemenGoodman, Kaimana Lurz, MD 12/20/16 [phone]
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
## 2016-12-20 NOTE — ED Notes (Signed)
Pt given lunch tray. In no distress

## 2016-12-20 NOTE — ED Notes (Signed)
Patient is resting comfortably. 

## 2016-12-20 NOTE — ED Notes (Signed)
Patient is resting comfortably in no distress

## 2016-12-20 NOTE — ED Notes (Signed)
Patient is resting comfortably watching television in no distress
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
## 2016-12-21 MED ORDER — QUETIAPINE FUMARATE 50 MG PO TABS: 50.0000 mg | ORAL_TABLET | Freq: Four times a day (QID) | ORAL | 1 refills | Status: DC | PRN

## 2016-12-21 MED ORDER — TUBERCULIN PPD 5 UNIT/0.1ML ID SOLN
5.0000 [IU] | Freq: Once | INTRADERMAL | Status: DC
  Administered 2016-12-21: 5 [IU] via INTRADERMAL
  Filled 2016-12-21: qty 0.1

## 2016-12-21 NOTE — ED Provider Notes (Signed)
-----------------------------------------   4:34 PM on 12/21/2016 -----------------------------------------   Blood pressure (!) 147/66, pulse (!) 58, temperature 97.9 F (36.6 C), temperature source Oral, resp. rate 18, height 5\' 9"  (1.753 m), weight 59 kg (130 lb), SpO2 100 %.  The patient had no acute events since last update.  Calm and cooperative at this time. Patient to be dispositioned to group home.   Myrna BlazerSchaevitz, Mohsen Odenthal Matthew, MD 12/21/16 [phone]

## 2016-12-21 NOTE — Progress Notes (Signed)
LCSW spoke to Paul Vaughan and she has requested  Written prescriptions, FL2 with documentation [facility] and awaiting [facility] representative to interview with patient.  Paul Vaughan will pick up patient by [employer]pm   Paul Lippert LCSW [phone]

## 2016-12-21 NOTE — ED Notes (Signed)
Pt asking to see the doctor. 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 Calm and cooperative. Maintained on 15 minute checks and observation by security camera for safety.

## 2016-12-21 NOTE — Progress Notes (Signed)
LCSW was contacted PASSR representative and will come to assess patient in the afternoon between 1-4pm.    Versie Soave LCSW

## 2016-12-21 NOTE — ED Notes (Signed)
Pt compliant with TB skin test and VS. Cooperative and calm with staff. Maintained on 15 minute checks and observation by security camera for safety.

## 2016-12-21 NOTE — ED Notes (Signed)
Pt discharged to group home. Discharge paperwork, prescription and FL2 given to group home representative.  Pt accepting of disposition. All belongings returned to patient.

## 2016-12-21 NOTE — Progress Notes (Signed)
LCSW met with Passr assessment specialist and provided her with detailed contact information. Called Pilar Grammes [phone] and Passr rep Germain Osgood LCSW  will go directly to patients new group home to complete assessment  LCSW notified DSS Alvino Chapel [phone]- that patient was accepted and supported Pilar Grammes and provided phone contact information and lengthy voice message information.  [employer] LCSW [phone]

## 2016-12-21 NOTE — Progress Notes (Signed)
LCSW called Birder RobsonJean Majors and her nephew and group home staff will come out to see if patient is suitable for their group home. LCSW will meet and support this meeting scheduled for 10am. [facility] notified.  Elanie Hammitt LCSW

## 2016-12-21 NOTE — ED Provider Notes (Signed)
-----------------------------------------   6:42 AM on 12/21/2016 -----------------------------------------   Blood pressure 112/75, pulse 62, temperature 97.9 F (36.6 C), temperature source Oral, resp. rate 18, height 5\' 9"  (1.753 m), weight 59 kg (130 lb), SpO2 100 %.  The patient had no acute events since last update.  Resting at this time.  Disposition is pending Psychiatry/Behavioral Medicine team recommendations.     Irean HongSung, Geraldin Habermehl J, MD 12/21/16 [phone]

## 2016-12-21 NOTE — ED Notes (Signed)
Pt dressing for discharge to group home.  Clothing searched before being given to patient.  Discharge paperwork, prescriptions and FL2 to be given to group home representative. Pt accepting of disposition. Maintained on 15 minute checks and observation by security camera for safety.

## 2017-04-09 ENCOUNTER — Emergency Department
Admission: EM | Admit: 2017-04-09 | Discharge: 2017-04-10 | Disposition: A | Discharge status: Home / Self Care | Payer: Medicare Other | Attending: Emergency Medicine | Admitting: Emergency Medicine

## 2017-04-09 ENCOUNTER — Encounter: Payer: Self-pay | Admitting: Emergency Medicine

## 2017-04-09 DIAGNOSIS — R4183 Borderline intellectual functioning: Secondary | ICD-10-CM | POA: Diagnosis not present

## 2017-04-09 DIAGNOSIS — Z111 Encounter for screening for respiratory tuberculosis: Secondary | ICD-10-CM | POA: Diagnosis not present

## 2017-04-09 DIAGNOSIS — R4689 Other symptoms and signs involving appearance and behavior: Secondary | ICD-10-CM

## 2017-04-09 DIAGNOSIS — F4325 Adjustment disorder with mixed disturbance of emotions and conduct: Secondary | ICD-10-CM | POA: Diagnosis present

## 2017-04-09 DIAGNOSIS — F17210 Nicotine dependence, cigarettes, uncomplicated: Secondary | ICD-10-CM | POA: Insufficient documentation

## 2017-04-09 DIAGNOSIS — Z79899 Other long term (current) drug therapy: Secondary | ICD-10-CM | POA: Diagnosis not present

## 2017-04-09 DIAGNOSIS — Z046 Encounter for general psychiatric examination, requested by authority: Secondary | ICD-10-CM | POA: Diagnosis present

## 2017-04-09 LAB — COMPREHENSIVE METABOLIC PANEL
ALT: 15 U/L — ABNORMAL LOW (ref 17–63)
AST: 23 U/L (ref 15–41)
Albumin: 4.1 g/dL (ref 3.5–5.0)
Alkaline Phosphatase: 104 U/L (ref 38–126)
Anion gap: 8 (ref 5–15)
BILIRUBIN TOTAL: 0.5 mg/dL (ref 0.3–1.2)
BUN: 18 mg/dL (ref 6–20)
CO2: 24 mmol/L (ref 22–32)
Calcium: 9.4 mg/dL (ref 8.9–10.3)
Chloride: 104 mmol/L (ref 101–111)
Creatinine, Ser: 0.84 mg/dL (ref 0.61–1.24)
Glucose, Bld: 122 mg/dL — ABNORMAL HIGH (ref 65–99)
POTASSIUM: 3.7 mmol/L (ref 3.5–5.1)
Sodium: 136 mmol/L (ref 135–145)
TOTAL PROTEIN: 7.4 g/dL (ref 6.5–8.1)

## 2017-04-09 LAB — URINE DRUG SCREEN, QUALITATIVE (ARMC ONLY)
AMPHETAMINES, UR SCREEN: NOT DETECTED
BENZODIAZEPINE, UR SCRN: NOT DETECTED
Barbiturates, Ur Screen: NOT DETECTED
CANNABINOID 50 NG, UR ~~LOC~~: NOT DETECTED
Cocaine Metabolite,Ur ~~LOC~~: NOT DETECTED
MDMA (Ecstasy)Ur Screen: NOT DETECTED
Methadone Scn, Ur: NOT DETECTED
Opiate, Ur Screen: NOT DETECTED
PHENCYCLIDINE (PCP) UR S: NOT DETECTED
Tricyclic, Ur Screen: NOT DETECTED

## 2017-04-09 LAB — SALICYLATE LEVEL: Salicylate Lvl: <7 mg/dL (ref 2.8–30.0)

## 2017-04-09 LAB — CBC
HCT: 40 % (ref 40.0–52.0)
Hemoglobin: 13.6 g/dL (ref 13.0–18.0)
MCH: 31.7 pg (ref 26.0–34.0)
MCHC: 33.9 g/dL (ref 32.0–36.0)
MCV: 93.7 fL (ref 80.0–100.0)
PLATELETS: 299 10*3/uL (ref 150–440)
RBC: 4.27 MIL/uL — ABNORMAL LOW (ref 4.40–5.90)
RDW: 13.4 % (ref 11.5–14.5)
WBC: 10.1 10*3/uL (ref 3.8–10.6)

## 2017-04-09 LAB — ETHANOL

## 2017-04-09 LAB — ACETAMINOPHEN LEVEL: Acetaminophen (Tylenol), Serum: <10 ug/mL — ABNORMAL LOW (ref 10–30)

## 2017-04-09 MED ORDER — QUETIAPINE FUMARATE 25 MG PO TABS
50.0000 mg | ORAL_TABLET | Freq: Three times a day (TID) | ORAL | Status: DC
  Administered 2017-04-09: 50 mg via ORAL
  Filled 2017-04-09 (×3): qty 2
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
## 2017-04-09 NOTE — ED Notes (Signed)
Pt given lunch tray.

## 2017-04-09 NOTE — ED Notes (Signed)
VOL/ Consult completed/ Social work placement
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
## 2017-04-09 NOTE — ED Notes (Signed)
Pt ambulated to bathroom unassisted to obtain urine sample. Pt appears to be very agitated and is trying to get hospital band off of his arm.

## 2017-04-09 NOTE — ED Provider Notes (Signed)
[facility] Emergency Department Provider Note   ____________________________________________    I have reviewed the triage vital signs and the nursing notes.   HISTORY  Chief Complaint Homicidal     HPI Paul Vaughan is a 61 y.o. male who presents for psychiatric evaluation.  Reportedly the patient was opening letters written by his caregiver and drawing satanic images on them and when confronted apparently he made threatening remarks to his caregiver.  He is not offering a tremendous amount of information to me.  He denies SI.  Denies physical complaints.  Apparently this happened today.   History reviewed. No pertinent past medical history.  Patient Active Problem List   Diagnosis Date Noted  . 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 No pertinent surgical history.  Prior to Admission medications   Medication Sig Start Date End Date Taking? Authorizing Provider  QUEtiapine (SEROQUEL) 50 MG tablet Take 1 tablet (50 mg total) by mouth every 6 (six) hours as needed (Agitation). 12/21/16   Clapacs, Jackquline DenmarkJohn T, MD     Allergies Penicillins and Sulfa antibiotics  No family history on file.  Social History Social History   Tobacco Use  . Smoking status: Current Every Day Smoker    Packs/day: 1.00    Types: Cigarettes  . Smokeless tobacco: Never Used  Substance Use Topics  . Alcohol use: No  . Drug use: No    Review of Systems  Constitutional: Denies dizziness Eyes: No visual changes.  ENT: Denies neck pain Cardiovascular: Denies chest pain. Respiratory: Denies shortness of breath. Gastrointestinal: No abdominal pain Genitourinary: Negative for dysuria. Musculoskeletal: Negative for back pain. Skin: No lacerations Neurological: Negative for headaches    ____________________________________________   PHYSICAL  EXAM:  VITAL SIGNS: ED Triage Vitals  Enc Vitals Group     BP 04/09/17 1141 (!) 106/58     Pulse Rate 04/09/17 1141 81     Resp 04/09/17 1141 16     Temp 04/09/17 1141 98.2 F (36.8 C)     Temp Source 04/09/17 1141 Oral     SpO2 04/09/17 1141 98 %     Weight 04/09/17 1142 51.3 kg (113 lb)     Height 04/09/17 1142 1.854 m (6\' 1" )     Head Circumference --      Peak Flow --      Pain Score --      Pain Loc --      Pain Edu? --      Excl. in GC? --     Constitutional: Alert and oriented. No acute distress.  Eyes: Conjunctivae are normal.   Nose: No congestion/rhinnorhea. Mouth/Throat: Mucous membranes are moist.    Cardiovascular: Normal rate, regular rhythm. Grossly normal heart sounds.  Good peripheral circulation. Respiratory: Normal respiratory effort.  No retractions. Lungs CTAB. Gastrointestinal: Soft and nontender. No distention.  No CVA tenderness. Genitourinary: deferred Musculoskeletal: No lower extremity tenderness nor edema.  Warm and well perfused Neurologic:  Normal speech and language. No gross focal neurologic deficits are appreciated.  Skin:  Skin is warm, dry and intact. No rash noted.   ____________________________________________   LABS (all labs ordered are listed, but only abnormal results are displayed)  Labs Reviewed  COMPREHENSIVE METABOLIC PANEL - Abnormal; Notable for the following components:      Result Value   Glucose, Bld 122 (*)    ALT 15 (*)  All other components within normal limits  ACETAMINOPHEN LEVEL - Abnormal; Notable for the following components:   Acetaminophen (Tylenol), Serum <10 (*)    All other components within normal limits  CBC - Abnormal; Notable for the following components:   RBC 4.27 (*)    All other components within normal limits  ETHANOL  SALICYLATE LEVEL  URINE DRUG SCREEN, QUALITATIVE ([facility] ONLY)    ____________________________________________  EKG  None ____________________________________________  RADIOLOGY  None ____________________________________________   PROCEDURES  Procedure(s) performed: No  Procedures   Critical Care performedNo ____________________________________________   INITIAL IMPRESSION / ASSESSMENT AND PLAN / ED COURSE  Pertinent labs & imaging results that were available during my care of the patient were reviewed by me and considered in my medical decision making (see chart for details).  Patient well-appearing in no acute distress.  Vital signs are unremarkable.  Given threatening statements I will complete IVC, consult psychiatry and TTS for further evaluation and disposition    ____________________________________________   FINAL CLINICAL IMPRESSION(S) / ED DIAGNOSES  Final diagnoses:  Aggressive behavior of adult        Note:  This document was prepared using Dragon voice recognition software and may include unintentional dictation errors.    Jene EveryKinner, Arissa Fagin, MD 04/09/17 1550

## 2017-04-09 NOTE — ED Notes (Signed)
Patient is alert and oriented. States he did not do anything that the worker at the group home does not like him and she is not telling the truth. Per report patient opened worker at group home birthday cards and drew a picture of Satan on them. The staff does not feel safe for other residents of group home or staff because of patient doing this. Patient denies all of this. Patient is irritable and defensive on assessment. Patient provided support. Q 15 minute checks in progress and patient remains safe on unit. Monitoring of patient continues.

## 2017-04-09 NOTE — ED Notes (Signed)
Spoke with Paul Vaughan (Child psychotherapistsocial worker) about discharge for patient. Dr. Toni Amendlapacs updated on patient behavior in group home and that [facility] at the group home did not feel safe with his return and was not wanting to receive him back. Dr. Toni Amendlapacs also informed that someone in social work will be working on his discharge as to where he will be going.

## 2017-04-09 NOTE — Discharge Instructions (Signed)
Please return to the emergency department for any thoughts of hurting herself or anyone else, or any other symptoms concerning to you.

## 2017-04-09 NOTE — Consult Note (Signed)
[facility] Consult   Reason for Consult: Consult for 61 year old man developmental disability and irritability brought in under IVC alleging he was threatening group home Referring Physician:  Corky Downs Patient Identification: Paul Vaughan MRN:  [id number] Principal Diagnosis: Adjustment disorder with mixed disturbance of emotions and conduct Diagnosis:   Patient Active Problem List   Diagnosis Date Noted  . 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 Adjustment disorder with mixed disturbance of emotions and conduct [F43.25] 09/08/2015    Total Time spent with patient: 1 hour  Subjective:   Paul Vaughan is a 61 y.o. male patient admitted with "I did not do anything, that woman just does not like me".  HPI: Patient interviewed chart reviewed.  61 year old man with a history of intellectual disability who was sent here from threatening behavior.  It alleges that he had drawn pictures of the devil on some holiday cards that were laying around and that he had written threatening messages on the mirror.  Patient denies having done any of those things.  He denies that he was the one who wrote on the mirror.  Patient denies any suicidal or homicidal thoughts.  He says he has been compliant with all of his medicine.  While he has been grumpy and angry and at times made some threatening statements here he has not carried through on any of them.  He denies any recent new stresses.  He says his health has been okay.  Social history: Patient has a history of intellectual disability.  For many years he lived independently with the help of his brother but in recent years that situation became impossible and he is now living in a group home.  He says he likes it okay although I am sure that he ends up butting heads with people at times.  Medical history: Patient is actually in pretty good health as far as we can tell does not have significant ongoing medical  problems.  Substance abuse history: Denies any alcohol or drug abuse there is no past history of any substance abuse problems.  Past Psychiatric History: Patient has a history of developmental disability.  We have seen him several times previously here in the emergency room under somewhat similar circumstances.  He has a tendency to act out be impulsive being intrusive and to bother other people but we do not have any evidence that he is ever actually hurt anyone.  No history of suicide attempts or violence.  No history that we can identify of psychosis.  Risk to Self: Is patient at risk for suicide?: No Risk to Others:   Prior Inpatient Therapy:   Prior Outpatient Therapy:    Past Medical History: History reviewed. No pertinent past medical history. History reviewed. No pertinent surgical history. Family History: No family history on file. Family Psychiatric  History: No known family history Social History:  Social History   Substance and Sexual Activity  Alcohol Use No     Social History   Substance and Sexual Activity  Drug Use No    Social History   Socioeconomic History  . Marital status: Single    Spouse name: None  . Number of children: None  . Years of education: None  . Highest education level: None  Social Needs  . Financial resource strain: None  . Food insecurity - worry: None  . Food insecurity - inability: None  . Transportation needs - medical: None  . Transportation needs - non-medical: None  Occupational History  .  None  Tobacco Use  . Smoking status: Current Every Day Smoker    Packs/day: 1.00    Types: Cigarettes  . Smokeless tobacco: Never Used  Substance and Sexual Activity  . Alcohol use: No  . Drug use: No  . Sexual activity: None  Other Topics Concern  . None  Social History Narrative  . None   Additional Social History:    Allergies:   Allergies  Allergen Reactions  . Penicillins Rash    .Has patient had a PCN reaction causing  immediate rash, facial/tongue/throat swelling, SOB or lightheadedness with hypotension: Unknown Has patient had a PCN reaction causing severe rash involving mucus membranes or skin necrosis: Unknown Has patient had a PCN reaction that required hospitalization: Unknown Has patient had a PCN reaction occurring within the last 10 years: Unknown If all of the above answers are "NO", then may proceed with Cephalosporin use.   . Sulfa Antibiotics Rash    Labs:  Results for orders placed or performed during the hospital encounter of 04/09/17 (from the past 48 hour(s))  Comprehensive metabolic panel     Status: Abnormal   Collection Time: 04/09/17 11:44 AM  Result Value Ref Range   Sodium 136 135 - 145 mmol/L   Potassium 3.7 3.5 - 5.1 mmol/L   Chloride 104 101 - 111 mmol/L   CO2 24 22 - 32 mmol/L   Glucose, Bld 122 (H) 65 - 99 mg/dL   BUN 18 6 - 20 mg/dL   Creatinine, Ser 0.84 0.61 - 1.24 mg/dL   Calcium 9.4 8.9 - 10.3 mg/dL   Total Protein 7.4 6.5 - 8.1 g/dL   Albumin 4.1 3.5 - 5.0 g/dL   AST 23 15 - 41 U/L   ALT 15 (L) 17 - 63 U/L   Alkaline Phosphatase 104 38 - 126 U/L   Total Bilirubin 0.5 0.3 - 1.2 mg/dL   GFR calc non Af Amer >60 >60 mL/min   GFR calc Af Amer >60 >60 mL/min    Comment: (NOTE) The eGFR has been calculated using the CKD EPI equation. This calculation has not been validated in all clinical situations. eGFR's persistently <60 mL/min signify possible Chronic Kidney Disease.    Anion gap 8 5 - 15  Ethanol     Status: None   Collection Time: 04/09/17 11:44 AM  Result Value Ref Range   Alcohol, Ethyl (B) <10 <10 mg/dL    Comment:        LOWEST DETECTABLE LIMIT FOR SERUM ALCOHOL IS 10 mg/dL FOR MEDICAL PURPOSES ONLY   Salicylate level     Status: None   Collection Time: 04/09/17 11:44 AM  Result Value Ref Range   Salicylate Lvl <3.7 2.8 - 30.0 mg/dL  Acetaminophen level     Status: Abnormal   Collection Time: 04/09/17 11:44 AM  Result Value Ref Range    Acetaminophen (Tylenol), Serum <10 (L) 10 - 30 ug/mL    Comment:        THERAPEUTIC CONCENTRATIONS VARY SIGNIFICANTLY. A RANGE OF 10-30 ug/mL MAY BE AN EFFECTIVE CONCENTRATION FOR MANY PATIENTS. HOWEVER, SOME ARE BEST TREATED AT CONCENTRATIONS OUTSIDE THIS RANGE. ACETAMINOPHEN CONCENTRATIONS >150 ug/mL AT 4 HOURS AFTER INGESTION AND >50 ug/mL AT 12 HOURS AFTER INGESTION ARE OFTEN ASSOCIATED WITH TOXIC REACTIONS.   cbc     Status: Abnormal   Collection Time: 04/09/17 11:44 AM  Result Value Ref Range   WBC 10.1 3.8 - 10.6 K/uL   RBC 4.27 (L) 4.40 - 5.90  MIL/uL   Hemoglobin 13.6 13.0 - 18.0 g/dL   HCT 40.0 40.0 - 52.0 %   MCV 93.7 80.0 - 100.0 fL   MCH 31.7 26.0 - 34.0 pg   MCHC 33.9 32.0 - 36.0 g/dL   RDW 13.4 11.5 - 14.5 %   Platelets 299 150 - 440 K/uL  Urine Drug Screen, Qualitative     Status: None   Collection Time: 04/09/17 11:44 AM  Result Value Ref Range   Tricyclic, Ur Screen NONE DETECTED NONE DETECTED   Amphetamines, Ur Screen NONE DETECTED NONE DETECTED   MDMA (Ecstasy)Ur Screen NONE DETECTED NONE DETECTED   Cocaine Metabolite,Ur Paul Vaughan NONE DETECTED NONE DETECTED   Opiate, Ur Screen NONE DETECTED NONE DETECTED   Phencyclidine (PCP) Ur S NONE DETECTED NONE DETECTED   Cannabinoid 50 Ng, Ur Paul Vaughan NONE DETECTED NONE DETECTED   Barbiturates, Ur Screen NONE DETECTED NONE DETECTED   Benzodiazepine, Ur Scrn NONE DETECTED NONE DETECTED   Methadone Scn, Ur NONE DETECTED NONE DETECTED    Comment: (NOTE) 903  Tricyclics, urine               Cutoff 1000 ng/mL 200  Amphetamines, urine             Cutoff 1000 ng/mL 300  MDMA (Ecstasy), urine           Cutoff 500 ng/mL 400  Cocaine Metabolite, urine       Cutoff 300 ng/mL 500  Opiate, urine                   Cutoff 300 ng/mL 600  Phencyclidine (PCP), urine      Cutoff 25 ng/mL 700  Cannabinoid, urine              Cutoff 50 ng/mL 800  Barbiturates, urine             Cutoff 200 ng/mL 900  Benzodiazepine, urine           Cutoff 200  ng/mL 1000 Methadone, urine                Cutoff 300 ng/mL 1100 1200 The urine drug screen provides only a preliminary, unconfirmed 1300 analytical test result and should not be used for non-medical 1400 purposes. Clinical consideration and professional judgment should 1500 be applied to any positive drug screen result due to possible 1600 interfering substances. A more specific alternate chemical method 1700 must be used in order to obtain a confirmed analytical result.  1800 Gas chromato graphy / mass spectrometry (GC/MS) is the preferred 1900 confirmatory method.     No current facility-administered medications for this encounter.    Current Outpatient Medications  Medication Sig Dispense Refill  . QUEtiapine (SEROQUEL) 50 MG tablet Take 1 tablet (50 mg total) by mouth every 6 (six) hours as needed (Agitation). 30 tablet 1    Musculoskeletal: Strength & Muscle Tone: within normal limits Gait & Station: normal Patient leans: N/A  Psychiatric Specialty Exam: Physical Exam  Nursing note and vitals reviewed. Constitutional: He appears well-developed and well-nourished.  HENT:  Head: Normocephalic and atraumatic.  Eyes: Conjunctivae are normal. Pupils are equal, round, and reactive to light.  Neck: Normal range of motion.  Cardiovascular: Regular rhythm and normal heart sounds.  Respiratory: Effort normal and breath sounds normal. No respiratory distress.  GI: Soft.  Musculoskeletal: Normal range of motion.  Neurological: He is alert.  Skin: Skin is warm and dry.  Psychiatric: His speech is normal and  behavior is normal. His affect is angry and blunt. Thought content is paranoid. He expresses impulsivity. He expresses no homicidal and no suicidal ideation. He exhibits abnormal recent memory.    Review of Systems  Constitutional: Negative.   HENT: Negative.   Eyes: Negative.   Respiratory: Negative.   Cardiovascular: Negative.   Gastrointestinal: Negative.    Musculoskeletal: Negative.   Skin: Negative.   Neurological: Negative.   Psychiatric/Behavioral: Negative for depression, hallucinations, memory loss, substance abuse and suicidal ideas. The patient is not nervous/anxious and does not have insomnia.     Blood pressure (!) 106/58, pulse 81, temperature 98.2 F (36.8 C), temperature source Oral, resp. rate 16, height 6' 1"  (1.854 m), weight 51.3 kg (113 lb), SpO2 98 %.Body mass index is 14.91 kg/m.  General Appearance: Casual  Eye Contact:  Fair  Speech:  Normal Rate  Volume:  Normal  Mood:  Irritable  Affect:  Congruent  Thought Process:  Disorganized  Orientation:  Full (Time, Place, and Person)  Thought Content:  Rumination and Tangential  Suicidal Thoughts:  No  Homicidal Thoughts:  No  Memory:  Immediate;   Good Recent;   Fair Remote;   Fair  Judgement:  Impaired  Insight:  Shallow  Psychomotor Activity:  Normal  Concentration:  Concentration: Fair  Recall:  Paul Vaughan:  Fair  Language:  Fair  Akathisia:  No  Handed:  Right  AIMS (if indicated):     Assets:  Desire for Improvement Housing Physical Health  ADL's:  Intact  Cognition:  Impaired,  Mild  Sleep:        Treatment Plan Summary: Medication management and Plan This is a 61 year old man with chronic irritability and odd behavior but no defined history of psychosis.  Although he frequently talks irritability there is no evidence that he is actually been violent with anyone.  He acknowledges that to me.  Based on my previous interaction with him he seems to be at his baseline mental state.  I do not think there is any benefit from admitting him to the psychiatric hospital.  He does not meet commitment criteria at this point.  I think he can be discharged back to his group home with follow-up in the community.  Disposition: Patient does not meet criteria for psychiatric inpatient admission. Supportive therapy provided about ongoing stressors.  Alethia Berthold, MD 04/09/2017 4:02 PM

## 2017-04-09 NOTE — ED Notes (Signed)
Pt is alert and oriented this evening. Pt mood is irritable but he cooperative with staff. Denies SI/HI and AVH. However, pt did refuse his Seroquel. Writer discussed tx plan and explained the benefits of taking medication as prescribed. 15 minute checks are ongoing for safety.

## 2017-04-10 MED ORDER — HYDROXYZINE PAMOATE 50 MG PO CAPS: 50.0000 mg | ORAL_CAPSULE | Freq: Three times a day (TID) | ORAL | 0 refills | Status: DC | PRN

## 2017-04-10 MED ORDER — TUBERCULIN PPD 5 UNIT/0.1ML ID SOLN
5.0000 [IU] | Freq: Once | INTRADERMAL | Status: DC
  Administered 2017-04-10: 5 [IU] via INTRADERMAL
  Filled 2017-04-10: qty 0.1

## 2017-04-10 MED ORDER — QUETIAPINE FUMARATE 50 MG PO TABS: 50.0000 mg | ORAL_TABLET | Freq: Three times a day (TID) | ORAL | 0 refills | Status: DC

## 2017-04-10 NOTE — ED Notes (Signed)
Pt is agitated this morning, banging on walls and slamming doors. Writer discussed options with patient and offered PO medication per MD, but pt declined. However, he did say he would be quiet. Pt states that he would be [facility] [address] rather than stay here. States that his brother is his payee but would not allow him to live with him. Pt also upset with group home for evicting him for "no reason." Pt returned to bed and shut off light and is resting quietly at this time. Will continue to monitor.

## 2017-04-10 NOTE — Consult Note (Signed)
[facility] Psychiatry Consult   Reason for Consult: Follow-up consult 61 year old man with a history of developmental disability and behavior problems who has been in the emergency room overnight Referring Physician: Owens Shark Patient Identification: Dijon Cosens MRN:  [id number] Principal Diagnosis: Adjustment disorder with mixed disturbance of emotions and conduct Diagnosis:   Patient Active Problem List   Diagnosis Date Noted  . 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 Adjustment disorder with mixed disturbance of emotions and conduct [F43.25] 09/08/2015    Total Time spent with patient: 30 minutes  Subjective:   Melven Stockard is a 61 y.o. male patient admitted with "I just want to get out of here".  HPI: Patient interviewed chart reviewed.  Case reviewed with nursing and emergency room staff.  61 year old man with a history of developmental disability and behavior problems.  Seen yesterday and was judged not to need inpatient treatment but was refused readmission initially by his group home.  Social work has subsequently contacted the group home and I am told that they are willing to readmit him temporarily with the understanding that he will be transferred within the next couple days.  Patient has no new complaints today.  Not reporting suicidal or homicidal thoughts.  Not reporting hallucinations.  No new physical complaints.  Substance abuse history none.  Social history patient has a legal guardian in the community now.  Used to be his brother but apparently the brother has surrendered the guardianship to the community.  Past Psychiatric History: Patient has a history of developmental disability and some odd behaviors but no clear-cut psychosis no history of actual dangerous violence or suicidal behavior.  Risk to Self: Is patient at risk for suicide?: No Risk to Others:   Prior Inpatient Therapy:   Prior Outpatient Therapy:    Past Medical History:  History reviewed. No pertinent past medical history. History reviewed. No pertinent surgical history. Family History: No family history on file. Family Psychiatric  History: None identified Social History:  Social History   Substance and Sexual Activity  Alcohol Use No     Social History   Substance and Sexual Activity  Drug Use No    Social History   Socioeconomic History  . Marital status: Single    Spouse name: None  . Number of children: None  . Years of education: None  . Highest education level: None  Social Needs  . Financial resource strain: None  . Food insecurity - worry: None  . Food insecurity - inability: None  . Transportation needs - medical: None  . Transportation needs - non-medical: None  Occupational History  . None  Tobacco Use  . Smoking status: Current Every Day Smoker    Packs/day: 1.00    Types: Cigarettes  . Smokeless tobacco: Never Used  Substance and Sexual Activity  . Alcohol use: No  . Drug use: No  . Sexual activity: None  Other Topics Concern  . None  Social History Narrative  . None   Additional Social History:    Allergies:   Allergies  Allergen Reactions  . Penicillins Rash    .Has patient had a PCN reaction causing immediate rash, facial/tongue/throat swelling, SOB or lightheadedness with hypotension: Unknown Has patient had a PCN reaction causing severe rash involving mucus membranes or skin necrosis: Unknown Has patient had a PCN reaction that required hospitalization: Unknown Has patient had a PCN reaction occurring within the last 10 years: Unknown If all of the above answers are "NO", then may proceed  with Cephalosporin use.   . Sulfa Antibiotics Rash    Labs:  Results for orders placed or performed during the hospital encounter of 04/09/17 (from the past 48 hour(s))  Comprehensive metabolic panel     Status: Abnormal   Collection Time: 04/09/17 11:44 AM  Result Value Ref Range   Sodium 136 135 - 145 mmol/L    Potassium 3.7 3.5 - 5.1 mmol/L   Chloride 104 101 - 111 mmol/L   CO2 24 22 - 32 mmol/L   Glucose, Bld 122 (H) 65 - 99 mg/dL   BUN 18 6 - 20 mg/dL   Creatinine, Ser 0.84 0.61 - 1.24 mg/dL   Calcium 9.4 8.9 - 10.3 mg/dL   Total Protein 7.4 6.5 - 8.1 g/dL   Albumin 4.1 3.5 - 5.0 g/dL   AST 23 15 - 41 U/L   ALT 15 (L) 17 - 63 U/L   Alkaline Phosphatase 104 38 - 126 U/L   Total Bilirubin 0.5 0.3 - 1.2 mg/dL   GFR calc non Af Amer >60 >60 mL/min   GFR calc Af Amer >60 >60 mL/min    Comment: (NOTE) The eGFR has been calculated using the CKD EPI equation. This calculation has not been validated in all clinical situations. eGFR's persistently <60 mL/min signify possible Chronic Kidney Disease.    Anion gap 8 5 - 15  Ethanol     Status: None   Collection Time: 04/09/17 11:44 AM  Result Value Ref Range   Alcohol, Ethyl (B) <10 <10 mg/dL    Comment:        LOWEST DETECTABLE LIMIT FOR SERUM ALCOHOL IS 10 mg/dL FOR MEDICAL PURPOSES ONLY   Salicylate level     Status: None   Collection Time: 04/09/17 11:44 AM  Result Value Ref Range   Salicylate Lvl <2.5 2.8 - 30.0 mg/dL  Acetaminophen level     Status: Abnormal   Collection Time: 04/09/17 11:44 AM  Result Value Ref Range   Acetaminophen (Tylenol), Serum <10 (L) 10 - 30 ug/mL    Comment:        THERAPEUTIC CONCENTRATIONS VARY SIGNIFICANTLY. A RANGE OF 10-30 ug/mL MAY BE AN EFFECTIVE CONCENTRATION FOR MANY PATIENTS. HOWEVER, SOME ARE BEST TREATED AT CONCENTRATIONS OUTSIDE THIS RANGE. ACETAMINOPHEN CONCENTRATIONS >150 ug/mL AT 4 HOURS AFTER INGESTION AND >50 ug/mL AT 12 HOURS AFTER INGESTION ARE OFTEN ASSOCIATED WITH TOXIC REACTIONS.   cbc     Status: Abnormal   Collection Time: 04/09/17 11:44 AM  Result Value Ref Range   WBC 10.1 3.8 - 10.6 K/uL   RBC 4.27 (L) 4.40 - 5.90 MIL/uL   Hemoglobin 13.6 13.0 - 18.0 g/dL   HCT 40.0 40.0 - 52.0 %   MCV 93.7 80.0 - 100.0 fL   MCH 31.7 26.0 - 34.0 pg   MCHC 33.9 32.0 - 36.0 g/dL    RDW 13.4 11.5 - 14.5 %   Platelets 299 150 - 440 K/uL  Urine Drug Screen, Qualitative     Status: None   Collection Time: 04/09/17 11:44 AM  Result Value Ref Range   Tricyclic, Ur Screen NONE DETECTED NONE DETECTED   Amphetamines, Ur Screen NONE DETECTED NONE DETECTED   MDMA (Ecstasy)Ur Screen NONE DETECTED NONE DETECTED   Cocaine Metabolite,Ur [place] NONE DETECTED NONE DETECTED   Opiate, Ur Screen NONE DETECTED NONE DETECTED   Phencyclidine (PCP) Ur S NONE DETECTED NONE DETECTED   Cannabinoid 50 Ng, Ur [place] NONE DETECTED NONE DETECTED   Barbiturates, Ur Screen  NONE DETECTED NONE DETECTED   Benzodiazepine, Ur Scrn NONE DETECTED NONE DETECTED   Methadone Scn, Ur NONE DETECTED NONE DETECTED    Comment: (NOTE) 638  Tricyclics, urine               Cutoff 1000 ng/mL 200  Amphetamines, urine             Cutoff 1000 ng/mL 300  MDMA (Ecstasy), urine           Cutoff 500 ng/mL 400  Cocaine Metabolite, urine       Cutoff 300 ng/mL 500  Opiate, urine                   Cutoff 300 ng/mL 600  Phencyclidine (PCP), urine      Cutoff 25 ng/mL 700  Cannabinoid, urine              Cutoff 50 ng/mL 800  Barbiturates, urine             Cutoff 200 ng/mL 900  Benzodiazepine, urine           Cutoff 200 ng/mL 1000 Methadone, urine                Cutoff 300 ng/mL 1100 1200 The urine drug screen provides only a preliminary, unconfirmed 1300 analytical test result and should not be used for non-medical 1400 purposes. Clinical consideration and professional judgment should 1500 be applied to any positive drug screen result due to possible 1600 interfering substances. A more specific alternate chemical method 1700 must be used in order to obtain a confirmed analytical result.  1800 Gas chromato graphy / mass spectrometry (GC/MS) is the preferred 1900 confirmatory method.     Current Facility-Administered Medications  Medication Dose Route Frequency Provider Last Rate Last Dose  . QUEtiapine (SEROQUEL) tablet  50 mg  50 mg Oral TID Micajah Dennin, Madie Reno, MD   50 mg at 04/09/17 1653  . tuberculin injection 5 Units  5 Units Intradermal Once Jahon Bart, Madie Reno, MD       Current Outpatient Medications  Medication Sig Dispense Refill  . hydrOXYzine (VISTARIL) 50 MG capsule Take 1 capsule (50 mg total) by mouth 3 (three) times daily as needed for anxiety. 60 capsule 0  . QUEtiapine (SEROQUEL) 50 MG tablet Take 1 tablet (50 mg total) by mouth 3 (three) times daily. 90 tablet 0    Musculoskeletal: Strength & Muscle Tone: within normal limits Gait & Station: normal Patient leans: N/A  Psychiatric Specialty Exam: Physical Exam  Nursing note and vitals reviewed. Constitutional: He appears well-developed and well-nourished.  HENT:  Head: Normocephalic and atraumatic.  Eyes: Conjunctivae are normal. Pupils are equal, round, and reactive to light.  Neck: Normal range of motion.  Cardiovascular: Regular rhythm and normal heart sounds.  Respiratory: Effort normal. No respiratory distress.  GI: Soft.  Musculoskeletal: Normal range of motion.  Neurological: He is alert.  Skin: Skin is warm and dry.  Psychiatric: He has a normal mood and affect. His behavior is normal. Judgment and thought content normal.    Review of Systems  Constitutional: Negative.   HENT: Negative.   Eyes: Negative.   Respiratory: Negative.   Cardiovascular: Negative.   Gastrointestinal: Negative.   Musculoskeletal: Negative.   Skin: Negative.   Neurological: Negative.   Psychiatric/Behavioral: Negative for depression, hallucinations, memory loss, substance abuse and suicidal ideas. The patient is not nervous/anxious and does not have insomnia.     Blood pressure (!) 156/75, pulse  72, temperature (!) 97.4 F (36.3 C), temperature source Oral, resp. rate 18, height _0  (1.854 m), weight 51.3 kg (113 lb), SpO2 100 %.Body mass index is 14.91 kg/m.  General Appearance: Fairly Groomed  Eye Contact:  Fair  Speech:  Slow  Volume:   Decreased  Mood:  Irritable  Affect:  Congruent  Thought Process:  Goal Directed  Orientation:  Full (Time, Place, and Person)  Thought Content:  Rumination and Tangential  Suicidal Thoughts:  No  Homicidal Thoughts:  No  Memory:  Immediate;   Fair Recent;   Fair Remote;   Fair  Judgement:  Impaired  Insight:  Shallow  Psychomotor Activity:  Decreased  Concentration:  Concentration: Poor  Recall:  [employer] of Knowledge:  Fair  Language:  Fair  Akathisia:  No  Handed:  Right  AIMS (if indicated):     Assets:  Communication Skills Desire for Improvement Physical Health Resilience Social Support  ADL's:  Intact  Cognition:  Impaired,  Mild  Sleep:        Treatment Plan Summary: Medication management and Plan 61 year old man with a history of what appears most likely to be developmental disability.  In my working with the patient he shows a pattern that I think is most suggestive of borderline to mild intellectual disability and possibly some autistic spectrum disorder component.  His behavior problems are mostly childlike.  Does not have an actual history of being violent to others.  Loses his temper and raises his voice but is ultimately redirectable.  Patient fortunately has now been accepted I am told at another group home for the end of this week and his current one will take him back for 2 days.  They requested that we give him medicine for as needed usage.  I have put in an order and printed a prescription for hydroxyzine that can be used 50 mg every 6 hours as needed for anxiety or agitation.  Also I have put in an order for a PPD test which I am told can then be read at the new facility on Friday.  Patient will be taken off of IVC and we are hoping that he will be able to leave the hospital today.  Disposition: No evidence of imminent risk to self or others at present.   Patient does not meet criteria for psychiatric inpatient admission. Supportive therapy provided about  ongoing stressors.  Alethia Berthold, MD 04/10/2017 1:53 PM

## 2017-04-10 NOTE — [place] FL2 (Signed)
  [place] MEDICAID FL2 LEVEL OF CARE SCREENING TOOL     IDENTIFICATION  Patient Name: Paul Vaughan Birthdate: [birth year] Sex: male Admission Date (Current Location): 04/09/2017  [place]   Facility and Address:  [facility], [address], [place], [place] [address]      Provider Number: [id number]  Attending Physician Name and Address:  No att. providers found  Relative Name and Phone Number:  Legal Wyatt MageGuardian Kay Flack (W)[phone] [facility] Social [facility]    Current Level of Care: Hospital Recommended Level of Care: [facility] Prior Approval Number:    Date Approved/Denied:   PASRR Number: [id number]  Discharge Plan: Domiciliary (Rest home)    Current Diagnoses: Patient Active Problem List   Diagnosis Date Noted  . 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    Orientation RESPIRATION BLADDER Height & Weight     Self, Time, Situation, Place  Normal Continent Weight: 113 lb (51.3 kg) Height:  6\' 1"  (185.4 cm)  BEHAVIORAL SYMPTOMS/MOOD NEUROLOGICAL BOWEL NUTRITION STATUS      Continent Diet  AMBULATORY STATUS COMMUNICATION OF NEEDS Skin   Independent Verbally Normal                       Personal Care Assistance Level of Assistance  Feeding, Dressing, Bathing Bathing Assistance: Independent Feeding assistance: Independent Dressing Assistance: Independent     Functional Limitations Info  Sight, Hearing, Speech Sight Info: Adequate Hearing Info: Adequate Speech Info: Adequate    SPECIAL CARE FACTORS FREQUENCY                       Contractures Contractures Info: Not present    Additional Factors Info  Code Status, Allergies, Psychotropic Code Status Info: Full Allergies Info: Penicillins, Sulfa Antibiotics Psychotropic Info: See Med List          Current Medications (04/10/2017):  This is the current hospital active medication list Current Facility-Administered Medications  Medication Dose Route Frequency Provider Last Rate Last Dose  . QUEtiapine (SEROQUEL) tablet 50 mg  50 mg Oral TID Clapacs, Jackquline DenmarkJohn T, MD   50 mg at 04/09/17 1653   Current Outpatient Medications  Medication Sig Dispense Refill  . QUEtiapine (SEROQUEL) 50 MG tablet Take 1 tablet (50 mg total) by mouth every 6 (six) hours as needed (Agitation). 30 tablet 1     Discharge Medications: Please see discharge summary for a list of discharge medications.  Relevant Imaging Results:  Relevant Lab Results:   Additional Information SSN: [id number]  Dominic PeaJeneya G Twyla Dais, LCSW

## 2017-04-10 NOTE — ED Notes (Signed)
PPD placed in left forearm at 1446 and was tolerated well. Verbal education done at time of administration and pt verbalized understanding.

## 2017-04-10 NOTE — ED Notes (Signed)
Patient given breakfast meal.

## 2017-04-10 NOTE — ED Notes (Signed)
Patient alert and verbal. Patient AVS/Prescriptions/Follow-up care reviewed with him and written copy of AVS; along with prescriptions given to patient. Patient verbalize understanding. All patient belongings returned to him. Patient vitals at discharge 98.1-138/66-66-18-100% room air. Darlys GalesKailee Jennings with [facility] call for transportation back to group home. Patient escorted to lobby by this Clinical research associatewriter.

## 2017-04-10 NOTE — ED Notes (Signed)
Received called from Lazaro ArmsKay Flack patient guardian with DSS. Informed by her that patient may return to previous group home where he will stay until he is moved to new group home on Friday this week. However present group home is requesting a prn medication for anxiety and agitation and he will also need a PPD to be given prior to discharge. The group home he is going to on Friday will take patient to have PPD read. This Clinical research associatewriter will update Dr. Toni Amendlapacs of request and wait for response. Patient updated on status of discharge as well and does agree with request.

## 2017-04-10 NOTE — ED Notes (Signed)
Patient given lunch meal. 

## 2017-04-10 NOTE — ED Notes (Signed)
Patient alert and verbal. Patient continues to express concerns for being discharged. [place] with social work called this am to come and update patient on discharge status. Patient aware that social work will talk with him and is working on discharge plans for him. Patient irritable because of wanting to leave; however he is cooperative with staff. Q 15 minute checks in progress and patient remains safe on unit. Monitoring continues.

## 2017-04-10 NOTE — Clinical Social Work Note (Addendum)
[facility] stating pt states he is his own guardian and brother is payee and pt wants to be released to a shelter.   CSW received a call from Lazaro ArmsKay Flack ([phone]) at [facility] stating pt is a ward of the state as of 03/19/17 and she is his Art therapistGuardianship Social Worker. CSW asked that Guardianship appointment paperwork be faxed to CSW. CSW received letter of appointment and will give to registration to be added to Epic. CSW added Radiation protection practitionerGuardianship banner and contact information for Ms. Flack. Please contact Ms. Flack with any updates as to pt's care and discharge planning. CSW staffed case with Social Work ChiropodistAssistant Director [employer] Groupackary Brooks. CSW to work with DSS and other community supports to find appropriate placement for pt.   12:52pm - CSW received a call from Ms. Flack stating asking for update on pt. CSW confirmed pt is medically and psychiatrically cleared for discharge. Ms. Janalyn ShyFlack stated Ms. Francena HanlyStella of [facility] agreed to take pt back until Friday, when he is placed at his new Group Home. CSW updated [facility] RN Britta MccreedyBarbara and provided contact number to speak with Ms. Flack directly about PRN med need and PPD need. Ms. Janalyn ShyFlack asked to complete FL-2 and fax to new group home.   2:55pm - CSW confirmed fax number for Doristine MangoClement is [phone] and faxed signed FL-2. [facility] RN Britta MccreedyBarbara awaiting PPD meds and will call Guardianship Supervisor Myriam ForehandKailee Morrow-Jennings [phone]) when pt is ready for discharge, as Ms. Milinda PointerKailee will arrange transport. CSW signing off as no further Social Work needs identified.   Corlis HoveJeneya Liandra Mendia, Theresia MajorsLCSWA, [facility] Clinical Social Worker-ED [phone]

## 2018-04-11 ENCOUNTER — Emergency Department: Payer: Medicare Other

## 2018-04-11 ENCOUNTER — Emergency Department
Admission: EM | Admit: 2018-04-11 | Discharge: 2018-04-11 | Disposition: A | Discharge status: Home / Self Care | Payer: Medicare Other | Attending: Emergency Medicine | Admitting: Emergency Medicine

## 2018-04-11 DIAGNOSIS — G40209 Localization-related (focal) (partial) symptomatic epilepsy and epileptic syndromes with complex partial seizures, not intractable, without status epilepticus: Secondary | ICD-10-CM | POA: Diagnosis not present

## 2018-04-11 DIAGNOSIS — R4182 Altered mental status, unspecified: Secondary | ICD-10-CM | POA: Diagnosis present

## 2018-04-11 LAB — COMPREHENSIVE METABOLIC PANEL
ALT: 23 U/L (ref 0–44)
AST: 29 U/L (ref 15–41)
Albumin: 3.7 g/dL (ref 3.5–5.0)
Alkaline Phosphatase: 70 U/L (ref 38–126)
Anion gap: 12 (ref 5–15)
BUN: 17 mg/dL (ref 8–23)
CO2: 28 mmol/L (ref 22–32)
Calcium: 9 mg/dL (ref 8.9–10.3)
Chloride: 99 mmol/L (ref 98–111)
Creatinine, Ser: 0.92 mg/dL (ref 0.61–1.24)
GFR calc Af Amer: 59 mL/min — ABNORMAL LOW (ref >60)
GFR calc non Af Amer: 51 mL/min — ABNORMAL LOW (ref >60)
Glucose, Bld: 110 mg/dL — ABNORMAL HIGH (ref 70–99)
Potassium: 3.8 mmol/L (ref 3.5–5.1)
Sodium: 139 mmol/L (ref 135–145)
Total Bilirubin: 0.5 mg/dL (ref 0.3–1.2)
Total Protein: 7.6 g/dL (ref 6.5–8.1)

## 2018-04-11 LAB — LACTIC ACID, PLASMA: Lactic Acid, Venous: 1.9 mmol/L (ref 0.5–1.9)

## 2018-04-11 LAB — CBC WITH DIFFERENTIAL/PLATELET
Abs Immature Granulocytes: 0.49 10*3/uL — ABNORMAL HIGH (ref 0.00–0.07)
Basophils Absolute: 0.1 10*3/uL (ref 0.0–0.1)
Basophils Relative: 1 %
EOS PCT: 0 %
Eosinophils Absolute: 0 10*3/uL (ref 0.0–0.5)
HCT: 37.3 % — ABNORMAL LOW (ref 39.0–52.0)
Hemoglobin: 12.4 g/dL — ABNORMAL LOW (ref 13.0–17.0)
Immature Granulocytes: 2 %
Lymphocytes Relative: 5 %
Lymphs Abs: 1.3 10*3/uL (ref 0.7–4.0)
MCH: 31.5 pg (ref 26.0–34.0)
MCHC: 33.2 g/dL (ref 30.0–36.0)
MCV: 94.7 fL (ref 80.0–100.0)
Monocytes Absolute: 0.8 10*3/uL (ref 0.1–1.0)
Monocytes Relative: 3 %
Neutro Abs: 23.2 10*3/uL — ABNORMAL HIGH (ref 1.7–7.7)
Neutrophils Relative %: 89 %
Platelets: 596 10*3/uL — ABNORMAL HIGH (ref 150–400)
RBC: 3.94 MIL/uL — ABNORMAL LOW (ref 4.22–5.81)
RDW: 13.1 % (ref 11.5–15.5)
WBC: 25.9 10*3/uL — ABNORMAL HIGH (ref 4.0–10.5)
nRBC: 0 % (ref 0.0–0.2)

## 2018-04-11 LAB — URINE DRUG SCREEN, QUALITATIVE (ARMC ONLY)
Amphetamines, Ur Screen: NOT DETECTED
Barbiturates, Ur Screen: NOT DETECTED
Benzodiazepine, Ur Scrn: NOT DETECTED
Cannabinoid 50 Ng, Ur ~~LOC~~: NOT DETECTED
Cocaine Metabolite,Ur ~~LOC~~: NOT DETECTED
MDMA (Ecstasy)Ur Screen: NOT DETECTED
Methadone Scn, Ur: NOT DETECTED
Opiate, Ur Screen: NOT DETECTED
Phencyclidine (PCP) Ur S: NOT DETECTED
TRICYCLIC, UR SCREEN: POSITIVE — AB

## 2018-04-11 LAB — BLOOD GAS, ARTERIAL
Acid-Base Excess: 5.2 mmol/L — ABNORMAL HIGH (ref 0.0–2.0)
Bicarbonate: 31.5 mmol/L — ABNORMAL HIGH (ref 20.0–28.0)
FIO2: 0.21
O2 Saturation: 26.1 %
PH ART: 7.39 (ref 7.350–7.450)
Patient temperature: 37
pCO2 arterial: 52 mmHg — ABNORMAL HIGH (ref 32.0–48.0)
pO2, Arterial: <31 mmHg — CL (ref 83.0–108.0)

## 2018-04-11 LAB — SALICYLATE LEVEL

## 2018-04-11 LAB — URINALYSIS, COMPLETE (UACMP) WITH MICROSCOPIC
BACTERIA UA: NONE SEEN
Bilirubin Urine: NEGATIVE
Glucose, UA: NEGATIVE mg/dL
Hgb urine dipstick: NEGATIVE
Ketones, ur: NEGATIVE mg/dL
Leukocytes, UA: NEGATIVE
Nitrite: NEGATIVE
PROTEIN: NEGATIVE mg/dL
Specific Gravity, Urine: 1.013 (ref 1.005–1.030)
Squamous Epithelial / HPF: NONE SEEN (ref 0–5)
pH: 6 (ref 5.0–8.0)

## 2018-04-11 LAB — ETHANOL: Alcohol, Ethyl (B): <10 mg/dL (ref <10)

## 2018-04-11 LAB — ACETAMINOPHEN LEVEL: Acetaminophen (Tylenol), Serum: <10 ug/mL — ABNORMAL LOW (ref 10–30)

## 2018-04-11 LAB — CK: Total CK: 185 U/L (ref 49–397)

## 2018-04-11 LAB — TROPONIN I: Troponin I: <0.03 ng/mL (ref <0.03)

## 2018-04-11 MED ORDER — LORAZEPAM 2 MG/ML IJ SOLN
1.0000 mg | Freq: Once | INTRAMUSCULAR | Status: AC
  Administered 2018-04-11: 1 mg via INTRAVENOUS

## 2018-04-11 MED ORDER — LORAZEPAM 2 MG/ML IJ SOLN
1.0000 mg | Freq: Once | INTRAMUSCULAR | Status: AC
  Administered 2018-04-11: 1 mg via INTRAVENOUS
  Filled 2018-04-11: qty 1

## 2018-04-11 NOTE — ED Notes (Signed)
This nurse talked to [place] DSS Alexa Dorris CarnesShields [phone] and stated that pt is part of Group [facility] [address] [place], [place]. DSS told this nurse to call group home to inform them that patient would be coming back to facility and that pt needs to follow-up with Neurology. DSS stated that if they would not be willing to take pt back then to call her back.

## 2018-04-11 NOTE — ED Triage Notes (Signed)
Pt arrived via [facility] with c/o Altered mental status. EMS states pt was found in middle of road unable to talk and not responding to any questions by EMS or police. Upon arrival, EMS stated that pt not able to answer any questions or respond to anything. Pt respond to painful stimuli. MD at bedside.

## 2018-04-11 NOTE — ED Notes (Signed)
[facility] Francena HanlyStella here to pick up patient in order to take patient back to group home. Esignature not working and not able to be signed. Group home Francena HanlyStella instructed on discharge instructions and follow-up with patient and verbalized understanding.

## 2018-04-11 NOTE — Discharge Instructions (Signed)
Please return for any further problems at all.  Please follow-up with neurology either Dr. Sherryll BurgerShah or Dr. Malvin JohnsPotter.  Give them a call Monday morning let them know you are in the emergency room with a first seizure.

## 2018-04-11 NOTE — ED Provider Notes (Signed)
[facility] Emergency Department Provider Note   ____________________________________________   First MD Initiated Contact with Patient 04/11/18 1911     (approximate)  I have reviewed the triage vital signs and the nursing notes.   HISTORY  Chief Complaint Altered Mental Status  Chief complaint is altered mental status history is limited by altered mental status  HPI [place] Susa SimmondsYy Doe is a [age] y.o. male came to the emergency room by EMS.  Police were called because he walked out in front of a car.  EMS arrived blood pressure CBG pulse rate were within normal limits x-ray temperature here was 97.  Patient follows some commands although she does not he is intermittently moaning and shaking but again does follow some commands he does not have cogwheel rigidity he is not stiff I cannot get any other history   No past medical history on file.  There are no active problems to display for this patient.     Prior to Admission medications   Not on File    Allergies Patient has no allergy information on record.  No family history on file.  Social History Social History   Tobacco Use  . Smoking status: Not on file  Substance Use Topics  . Alcohol use: Not on file  . Drug use: Not on file    Review of Systems Unable to obtain due to altered mental status ____________________________________________   PHYSICAL EXAM:  VITAL SIGNS: ED Triage Vitals [04/11/18 1911]  Enc Vitals Group     BP (!) 159/101     Pulse Rate (!) 110     Resp 18     Temp (!) 97.4 F (36.3 C)     Temp Source Axillary     SpO2 100 %     Weight 150 lb (68 kg)     Height 5\' 9"  (1.753 m)     Head Circumference      Peak Flow      Pain Score      Pain Loc      Pain Edu?      Excl. in GC?     Constitutional: Moaning shaking occasionally will follow commands Eyes: Conjunctivae are normal.  Pupils are generally up into the right pupils are round reactive. Head:  Atraumatic. Nose: No congestion/rhinnorhea.  Anterior part of his head is shaved the area over the vertex is a crewcut Mouth/Throat: Mucous membranes are moist.  Oropharynx non-erythematous. Neck: No stridor.   Cardiovascular: Normal rate, regular rhythm. Grossly normal heart sounds.  Good peripheral circulation. Respiratory: Normal respiratory effort.  No retractions. Lungs CTAB although is difficult to hear between the moans. Gastrointestinal: Soft and nontender. No distention. No abdominal bruits. No CVA tenderness. Musculoskeletal: No lower extremity tenderness nor edema.  No  Neurologic: See HPI Skin:  Skin is warm, dry and intact. No rash noted.   ____________________________________________   LABS (all labs ordered are listed, but only abnormal results are displayed)  Labs Reviewed  ACETAMINOPHEN LEVEL - Abnormal; Notable for the following components:      Result Value   Acetaminophen (Tylenol), Serum <10 (*)    All other components within normal limits  COMPREHENSIVE METABOLIC PANEL - Abnormal; Notable for the following components:   Glucose, Bld 110 (*)    GFR calc non Af Amer 51 (*)    GFR calc Af Amer 59 (*)    All other components within normal limits  CBC WITH DIFFERENTIAL/PLATELET - Abnormal; Notable for the  following components:   WBC 25.9 (*)    RBC 3.94 (*)    Hemoglobin 12.4 (*)    HCT 37.3 (*)    Platelets 596 (*)    Neutro Abs 23.2 (*)    Abs Immature Granulocytes 0.49 (*)    All other components within normal limits  URINALYSIS, COMPLETE (UACMP) WITH MICROSCOPIC - Abnormal; Notable for the following components:   Color, Urine YELLOW (*)    APPearance CLEAR (*)    All other components within normal limits  URINE DRUG SCREEN, QUALITATIVE ([facility] ONLY) - Abnormal; Notable for the following components:   Tricyclic, Ur Screen POSITIVE (*)    All other components within normal limits  BLOOD GAS, ARTERIAL - Abnormal; Notable for the following components:   pCO2  arterial 52 (*)    pO2, Arterial <31.0 (*)    Bicarbonate 31.5 (*)    Acid-Base Excess 5.2 (*)    All other components within normal limits  ETHANOL  SALICYLATE LEVEL  TROPONIN I  LACTIC ACID, PLASMA  CK  LACTIC ACID, PLASMA   ____________________________________________  EKG EKG read interpreted by me shows sinus tachycardia rate of 102 normal axis no acute ST-T changes  ____________________________________________  RADIOLOGY  ED MD interpretation: Chest x-ray read by radiology reviewed by me no acute disease CT of the same  Official radiology report(s): Ct Head Wo Contrast  Result Date: 04/11/2018 CLINICAL DATA:  Altered mental status. EXAM: CT HEAD WITHOUT CONTRAST TECHNIQUE: Contiguous axial images were obtained from the base of the skull through the vertex without intravenous contrast. COMPARISON:  None. FINDINGS: Brain: Ventricles, cisterns and other CSF spaces are within normal. There is no mass, mass effect, shift of midline structures or acute hemorrhage. No evidence of acute infarction. Vascular: No hyperdense vessel or unexpected calcification. Skull: Normal. Negative for fracture or focal lesion. Sinuses/Orbits: No acute finding. Other: None. IMPRESSION: No acute findings. Electronically Signed   By: Elberta Fortis M.D.   On: 04/11/2018 20:17   Dg Chest Portable 1 View  Result Date: 04/11/2018 CLINICAL DATA:  Altered mental status. EXAM: PORTABLE CHEST 1 VIEW COMPARISON:  None. FINDINGS: Lungs are adequately inflated as patient is slightly rotated to the right. There is no focal airspace consolidation or effusion. Cardiomediastinal silhouette is within normal. Remainder of the exam is unremarkable. IMPRESSION: No active disease. Electronically Signed   By: Elberta Fortis M.D.   On: 04/11/2018 19:34    ____________________________________________   PROCEDURES  Procedure(s) performed:   Procedures  Critical Care performed:    ____________________________________________   INITIAL IMPRESSION / ASSESSMENT AND PLAN / ED COURSE  ----------------------------------------- 8:35 PM on 04/11/2018 -----------------------------------------  After 2 of Ativan and some time passes patient is now completely awake alert denies headache neck stiffness is aware that yesterday was Halloween can tell me his name and address I am thinking that possibly he had partial complex seizure except for his white blood count which could be explained by the shaking and seizure-like activity his labs are essentially normal his UDS is positive for tricyclics will talk to neurology and see what they think.    Discussed with Dr. Loretha Brasil neurology.  He feels the patient is back to baseline has no history of doing this before has nothing on CT to serve as a focus of the seizure we should be allowed him go home follow-up with outpatient neurology.  ____________________________________________   FINAL CLINICAL IMPRESSION(S) / ED DIAGNOSES  Final diagnoses:  Partial symptomatic epilepsy with complex partial  seizures, not intractable, without status epilepticus [facility])     ED Discharge Orders    None       Note:  This document was prepared using Dragon voice recognition software and may include unintentional dictation errors.    Arnaldo Natal, MD 04/11/18 2046

## 2018-04-11 NOTE — ED Notes (Signed)
DSS Jon GillsAlexis Stated that she was able to contact group home [facility] and stated that she would contact the group home administrator Francena HanlyStella in order to come and get the patient. DSS Jon GillsAlexis told this nurse that the patient should be picked up and that if they do not come in the next 30 minutes to call back in order to get a timeframe of when the patient should be picked up.

## 2018-04-11 NOTE — ED Notes (Signed)
Attempted to call [facility] x2 with no response. Called Gus PumaAlexis Shields DSS again and she stated that she would try to get the administrators telephone number and call this nurse back.

## 2018-04-11 NOTE — ED Notes (Signed)
 DSS called and stated that they had no information regarding patient. DSS stated that they would call Adult Protective Services to try and figure out where patient group home is and try to see if they can figure out where the patient is supposed to go. DSS stated that they would call back.

## 2018-08-20 ENCOUNTER — Other Ambulatory Visit: Payer: Self-pay

## 2018-08-20 ENCOUNTER — Emergency Department
Admission: EM | Admit: 2018-08-20 | Discharge: 2018-08-20 | Disposition: A | Discharge status: Home / Self Care | Payer: Medicare Other | Attending: Emergency Medicine | Admitting: Emergency Medicine

## 2018-08-20 DIAGNOSIS — F17210 Nicotine dependence, cigarettes, uncomplicated: Secondary | ICD-10-CM | POA: Insufficient documentation

## 2018-08-20 DIAGNOSIS — E860 Dehydration: Secondary | ICD-10-CM

## 2018-08-20 DIAGNOSIS — R531 Weakness: Secondary | ICD-10-CM | POA: Diagnosis not present

## 2018-08-20 LAB — CBC WITH DIFFERENTIAL/PLATELET
Abs Immature Granulocytes: 0.06 10*3/uL (ref 0.00–0.07)
Basophils Absolute: 0 10*3/uL (ref 0.0–0.1)
Basophils Relative: 0 %
Eosinophils Absolute: 0 10*3/uL (ref 0.0–0.5)
Eosinophils Relative: 0 %
HCT: 46.5 % (ref 39.0–52.0)
Hemoglobin: 15.4 g/dL (ref 13.0–17.0)
Immature Granulocytes: 0 %
Lymphocytes Relative: 13 %
Lymphs Abs: 1.8 10*3/uL (ref 0.7–4.0)
MCH: 30.6 pg (ref 26.0–34.0)
MCHC: 33.1 g/dL (ref 30.0–36.0)
MCV: 92.3 fL (ref 80.0–100.0)
Monocytes Absolute: 1 10*3/uL (ref 0.1–1.0)
Monocytes Relative: 7 %
Neutro Abs: 11.1 10*3/uL — ABNORMAL HIGH (ref 1.7–7.7)
Neutrophils Relative %: 80 %
Platelets: 261 10*3/uL (ref 150–400)
RBC: 5.04 MIL/uL (ref 4.22–5.81)
RDW: 12.2 % (ref 11.5–15.5)
WBC: 14 10*3/uL — ABNORMAL HIGH (ref 4.0–10.5)
nRBC: 0 % (ref 0.0–0.2)

## 2018-08-20 LAB — COMPREHENSIVE METABOLIC PANEL
ALT: 19 U/L (ref 0–44)
AST: 47 U/L — ABNORMAL HIGH (ref 15–41)
Albumin: 5.2 g/dL — ABNORMAL HIGH (ref 3.5–5.0)
Alkaline Phosphatase: 105 U/L (ref 38–126)
Anion gap: 14 (ref 5–15)
BUN: 52 mg/dL — ABNORMAL HIGH (ref 8–23)
CO2: 24 mmol/L (ref 22–32)
Calcium: 10 mg/dL (ref 8.9–10.3)
Chloride: 103 mmol/L (ref 98–111)
Creatinine, Ser: 1.62 mg/dL — ABNORMAL HIGH (ref 0.61–1.24)
GFR calc Af Amer: 52 mL/min — ABNORMAL LOW (ref >60)
GFR calc non Af Amer: 45 mL/min — ABNORMAL LOW (ref >60)
Glucose, Bld: 113 mg/dL — ABNORMAL HIGH (ref 70–99)
Potassium: 4.6 mmol/L (ref 3.5–5.1)
Sodium: 141 mmol/L (ref 135–145)
Total Bilirubin: 1 mg/dL (ref 0.3–1.2)
Total Protein: 9.1 g/dL — ABNORMAL HIGH (ref 6.5–8.1)

## 2018-08-20 LAB — BASIC METABOLIC PANEL
Anion gap: 11 (ref 5–15)
BUN: 51 mg/dL — ABNORMAL HIGH (ref 8–23)
CO2: 24 mmol/L (ref 22–32)
Calcium: 9.7 mg/dL (ref 8.9–10.3)
Chloride: 104 mmol/L (ref 98–111)
Creatinine, Ser: 1.46 mg/dL — ABNORMAL HIGH (ref 0.61–1.24)
GFR calc Af Amer: 58 mL/min — ABNORMAL LOW (ref >60)
GFR calc non Af Amer: 50 mL/min — ABNORMAL LOW (ref >60)
Glucose, Bld: 129 mg/dL — ABNORMAL HIGH (ref 70–99)
Potassium: 4.5 mmol/L (ref 3.5–5.1)
Sodium: 139 mmol/L (ref 135–145)

## 2018-08-20 LAB — GLUCOSE, CAPILLARY: Glucose-Capillary: 84 mg/dL (ref 70–99)

## 2018-08-20 LAB — CK
Total CK: 1536 U/L — ABNORMAL HIGH (ref 49–397)
Total CK: 1491 U/L — ABNORMAL HIGH (ref 49–397)

## 2018-08-20 LAB — TROPONIN I: Troponin I: <0.03 ng/mL (ref <0.03)

## 2018-08-20 MED ORDER — SODIUM CHLORIDE 0.9 % IV SOLN
1000.0000 mL | Freq: Once | INTRAVENOUS | Status: AC
  Administered 2018-08-20: 09:00:00 1000 mL via INTRAVENOUS

## 2018-08-20 MED ORDER — SODIUM CHLORIDE 0.9 % IV SOLN
Freq: Once | INTRAVENOUS | Status: AC
  Administered 2018-08-20: 11:00:00 via INTRAVENOUS

## 2018-08-20 MED ORDER — QUETIAPINE FUMARATE 50 MG PO TABS: 50.0000 mg | ORAL_TABLET | Freq: Three times a day (TID) | ORAL | 2 refills | Status: DC

## 2018-08-20 NOTE — ED Notes (Signed)
Pt requesting to leave ED. RN explain the need for the Normal Saline via IV and the need to wait for ride. PT calm and agrees to stay. Pt provided with coke to drink. NAD at this time.

## 2018-08-20 NOTE — ED Notes (Signed)
Report given to Shannon RN

## 2018-08-20 NOTE — ED Notes (Signed)
Pts legal guardian arrived to ED to drive pt to group home. Discharge papers reviewed and understanding of Prescriptions verbalized. Pt in NAD at time of discharge and able to ambulate without difficulty.

## 2018-08-20 NOTE — ED Notes (Signed)
Called Ms Hickman about coming to pick the pt up at 11:30- stated she would call his group home to get him and requested to speak to Dr Mayford Knife regarding the pts seroquil prescription

## 2018-08-20 NOTE — ED Notes (Signed)
Pt agreed to have fluids connected - reassured pt that if he wanted IV removed that I would remove it but to please not pull it out due to risk of bleeding - pt agrees to this at this time

## 2018-08-20 NOTE — ED Provider Notes (Signed)
[facility] Emergency Department Provider Note       Time seen: ----------------------------------------- 8:17 AM on 08/20/2018 -----------------------------------------  Level V caveat: History/ROS limited by confusion I have reviewed the triage vital signs and the nursing notes.  HISTORY   Chief Complaint Weakness    HPI Paul Vaughan is a 63 y.o. male with a history of borderline intellectual disability, adjustment disorder who presents to the ED for altered mental status.  Patient reportedly is just not feeling right, comes from a halfway house after being out all night and return back acting differently.  He lives in a group home.  He states he has not had much to eat or drink in the last day.  He denies any complaints at this time but does seem somewhat confused according to his group home.  No past medical history on file.  Patient Active Problem List   Diagnosis Date Noted  . 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  Allergies Penicillins and Sulfa antibiotics  Social History Social History   Tobacco Use  . Smoking status: Current Every Day Smoker    Packs/day: 1.00    Types: Cigarettes  . Smokeless tobacco: Never Used  Substance Use Topics  . Alcohol use: No  . Drug use: No   Review of Systems Unknown at this time  All systems negative/normal/unremarkable except as stated in the HPI  ____________________________________________   PHYSICAL EXAM:  VITAL SIGNS: ED Triage Vitals  Enc Vitals Group     BP      Pulse      Resp      Temp      Temp src      SpO2      Weight      Height      Head Circumference      Peak Flow      Pain Score      Pain Loc      Pain Edu?      Excl. in GC?    Constitutional: Alert, no distress Eyes: Conjunctivae are normal. Normal extraocular  movements. Cardiovascular: Normal rate, regular rhythm. No murmurs, rubs, or gallops. Respiratory: Normal respiratory effort without tachypnea nor retractions. Breath sounds are clear and equal bilaterally. No wheezes/rales/rhonchi. Gastrointestinal: Soft and nontender. Normal bowel sounds Musculoskeletal: Nontender with normal range of motion in extremities. No lower extremity tenderness nor edema. Neurologic:  Normal speech and language. No gross focal neurologic deficits are appreciated.  Tremor is noted.  Generalized weakness, nothing focal Skin:  Skin is warm, dry and intact. No rash noted. Psychiatric: Mood and affect are normal.  ____________________________________________  EKG: Interpreted by me.  Sinus rhythm rate of 78 bpm, normal PR interval, normal QT, normal axis nonspecific ST segment changes  ____________________________________________  ED COURSE:  As part of my medical decision making, I reviewed the following data within the electronic MEDICAL RECORD NUMBER History obtained from family if available, nursing notes, old chart and ekg, as well as notes from prior ED visits. Patient presented for weakness and altered mental status, we will assess with labs and imaging as indicated at this time.   Procedures  Paul Vaughan was evaluated in Emergency Department on 08/20/2018 for the symptoms described in the history of present illness. He was evaluated in the context of the global COVID-19 pandemic, which necessitated consideration that the patient might be at risk  for infection with the SARS-CoV-2 virus that causes COVID-19. Institutional protocols and algorithms that pertain to the evaluation of patients at risk for COVID-19 are in a state of rapid change based on information released by regulatory bodies including the CDC and federal and state organizations. These policies and algorithms were followed during the patient's care in the ED.   ____________________________________________   LABS (pertinent positives/negatives)  Labs Reviewed  CBC WITH DIFFERENTIAL/PLATELET - Abnormal; Notable for the following components:      Result Value   WBC 14.0 (*)    Neutro Abs 11.1 (*)    All other components within normal limits  COMPREHENSIVE METABOLIC PANEL - Abnormal; Notable for the following components:   Glucose, Bld 113 (*)    BUN 52 (*)    Creatinine, Ser 1.62 (*)    Total Protein 9.1 (*)    Albumin 5.2 (*)    AST 47 (*)    GFR calc non Af Amer 45 (*)    GFR calc Af Amer 52 (*)    All other components within normal limits  CK - Abnormal; Notable for the following components:   Total CK 1,536 (*)    All other components within normal limits  CK - Abnormal; Notable for the following components:   Total CK 1,491 (*)    All other components within normal limits  BASIC METABOLIC PANEL - Abnormal; Notable for the following components:   Glucose, Bld 129 (*)    BUN 51 (*)    Creatinine, Ser 1.46 (*)    GFR calc non Af Amer 50 (*)    GFR calc Af Amer 58 (*)    All other components within normal limits  TROPONIN I  GLUCOSE, CAPILLARY  URINALYSIS, COMPLETE (UACMP) WITH MICROSCOPIC  CBG MONITORING, ED  ____________________________________________   DIFFERENTIAL DIAGNOSIS   Dehydration, electrolyte abnormality, occult infection, medication side effect, occult infection  FINAL ASSESSMENT AND PLAN  Weakness, dehydration   Plan: The patient had presented for weakness. Patient's labs did reveal mild elevations in his CK level and acute kidney injury likely from dehydration.  Patient was given breakfast as well as 2 L of IV fluid.  We have repeated a basic metabolic panel and CK levels which appears to be improving.  He is cleared for outpatient follow-up.   [name], MD    Note: This note was generated in part or whole with voice recognition software. Voice recognition is usually quite accurate but there  are transcription errors that can and very often do occur. I apologize for any typographical errors that were not detected and corrected.     Emily Filbert, MD 08/20/18 1055

## 2018-08-20 NOTE — ED Notes (Signed)
Pt given turkey sandwich, applesauce, and ginger ale.  

## 2018-08-20 NOTE — ED Triage Notes (Signed)
Pt arrivied via ems with weakness and "just not feeling right"- pt came from an assisted living/halfway house after being put all night and returned back acting different- pt states he has not had anything to eat and has only had a glass of water in the last 24 hours

## 2018-08-20 NOTE — ED Notes (Signed)
Called and informed Paul Vaughan of pt being here

## 2018-08-20 NOTE — ED Notes (Signed)
Pt agitated with the flushing of IV and refuses to have the bag of NACL connected - pt did allow blood draw - will notify Dr Mayford Knife

## 2018-08-20 NOTE — ED Notes (Signed)
Pt reports that he is unable to void at this time

## 2018-08-20 NOTE — ED Notes (Signed)
Called the cell number of the legal guardian with no answer- called the work number of the legal guardian provided and was informed that the pt has a new legal guardianArdean Larsen Vaughan at [phone]

## 2018-08-20 NOTE — ED Notes (Signed)
Pt attempting to leave ED - he has removed IV and on 2 occassions has pushed past nurse to open door and walked down hall - Dr Mayford Knife has convinced pt to sit on side of bed for IV and repeat blood work - pt is very anxious and states that he does not have a guardian and can leave anytime he wants to Pt guardian notified of above and she states that she does not want the pt to leave until all treatment has been rendered - had to terminate conversation due to pt walking out of room prior to setting up a ride home for the pt - Dr Mayford Knife notified of guardian request

## 2018-11-29 ENCOUNTER — Emergency Department
Admission: EM | Admit: 2018-11-29 | Discharge: 2018-11-30 | Disposition: A | Discharge status: Home / Self Care | Payer: Medicare Other | Attending: Emergency Medicine | Admitting: Emergency Medicine

## 2018-11-29 ENCOUNTER — Other Ambulatory Visit: Payer: Self-pay

## 2018-11-29 DIAGNOSIS — M79672 Pain in left foot: Secondary | ICD-10-CM | POA: Insufficient documentation

## 2018-11-29 DIAGNOSIS — Y9289 Other specified places as the place of occurrence of the external cause: Secondary | ICD-10-CM | POA: Insufficient documentation

## 2018-11-29 DIAGNOSIS — X58XXXA Exposure to other specified factors, initial encounter: Secondary | ICD-10-CM | POA: Insufficient documentation

## 2018-11-29 DIAGNOSIS — M79671 Pain in right foot: Secondary | ICD-10-CM | POA: Diagnosis not present

## 2018-11-29 DIAGNOSIS — F78 Other intellectual disabilities: Secondary | ICD-10-CM | POA: Diagnosis not present

## 2018-11-29 DIAGNOSIS — Y9301 Activity, walking, marching and hiking: Secondary | ICD-10-CM | POA: Diagnosis not present

## 2018-11-29 DIAGNOSIS — S90821A Blister (nonthermal), right foot, initial encounter: Secondary | ICD-10-CM | POA: Diagnosis not present

## 2018-11-29 DIAGNOSIS — Z008 Encounter for other general examination: Secondary | ICD-10-CM

## 2018-11-29 DIAGNOSIS — S90822A Blister (nonthermal), left foot, initial encounter: Secondary | ICD-10-CM | POA: Insufficient documentation

## 2018-11-29 DIAGNOSIS — F4325 Adjustment disorder with mixed disturbance of emotions and conduct: Secondary | ICD-10-CM | POA: Diagnosis not present

## 2018-11-29 DIAGNOSIS — Y999 Unspecified external cause status: Secondary | ICD-10-CM | POA: Diagnosis not present

## 2018-11-29 DIAGNOSIS — Z0489 Encounter for examination and observation for other specified reasons: Secondary | ICD-10-CM | POA: Diagnosis present

## 2018-11-29 DIAGNOSIS — Z79899 Other long term (current) drug therapy: Secondary | ICD-10-CM | POA: Diagnosis not present

## 2018-11-29 DIAGNOSIS — F17210 Nicotine dependence, cigarettes, uncomplicated: Secondary | ICD-10-CM | POA: Diagnosis not present

## 2018-11-29 LAB — ETHANOL: Alcohol, Ethyl (B): <10 mg/dL (ref <10)

## 2018-11-29 LAB — URINALYSIS, COMPLETE (UACMP) WITH MICROSCOPIC
Bacteria, UA: NONE SEEN
Bilirubin Urine: NEGATIVE
Glucose, UA: NEGATIVE mg/dL
Hgb urine dipstick: NEGATIVE
Ketones, ur: 20 mg/dL — AB
Leukocytes,Ua: NEGATIVE
Nitrite: NEGATIVE
Protein, ur: NEGATIVE mg/dL
Specific Gravity, Urine: 1.021 (ref 1.005–1.030)
pH: 5 (ref 5.0–8.0)

## 2018-11-29 LAB — URINE DRUG SCREEN, QUALITATIVE (ARMC ONLY)
Amphetamines, Ur Screen: NOT DETECTED
Barbiturates, Ur Screen: NOT DETECTED
Benzodiazepine, Ur Scrn: NOT DETECTED
Cannabinoid 50 Ng, Ur ~~LOC~~: NOT DETECTED
Cocaine Metabolite,Ur ~~LOC~~: NOT DETECTED
MDMA (Ecstasy)Ur Screen: NOT DETECTED
Methadone Scn, Ur: NOT DETECTED
Opiate, Ur Screen: NOT DETECTED
Phencyclidine (PCP) Ur S: NOT DETECTED
Tricyclic, Ur Screen: NOT DETECTED

## 2018-11-29 LAB — COMPREHENSIVE METABOLIC PANEL
ALT: 18 U/L (ref 0–44)
AST: 36 U/L (ref 15–41)
Albumin: 4.7 g/dL (ref 3.5–5.0)
Alkaline Phosphatase: 89 U/L (ref 38–126)
Anion gap: 15 (ref 5–15)
BUN: 56 mg/dL — ABNORMAL HIGH (ref 8–23)
CO2: 18 mmol/L — ABNORMAL LOW (ref 22–32)
Calcium: 8.9 mg/dL (ref 8.9–10.3)
Chloride: 101 mmol/L (ref 98–111)
Creatinine, Ser: 1.75 mg/dL — ABNORMAL HIGH (ref 0.61–1.24)
GFR calc Af Amer: 47 mL/min — ABNORMAL LOW (ref >60)
GFR calc non Af Amer: 41 mL/min — ABNORMAL LOW (ref >60)
Glucose, Bld: 94 mg/dL (ref 70–99)
Potassium: 4.7 mmol/L (ref 3.5–5.1)
Sodium: 134 mmol/L — ABNORMAL LOW (ref 135–145)
Total Bilirubin: 1.6 mg/dL — ABNORMAL HIGH (ref 0.3–1.2)
Total Protein: 7.6 g/dL (ref 6.5–8.1)

## 2018-11-29 LAB — CBC WITH DIFFERENTIAL/PLATELET
Abs Immature Granulocytes: 0.09 10*3/uL — ABNORMAL HIGH (ref 0.00–0.07)
Basophils Absolute: 0.1 10*3/uL (ref 0.0–0.1)
Basophils Relative: 0 %
Eosinophils Absolute: 0 10*3/uL (ref 0.0–0.5)
Eosinophils Relative: 0 %
HCT: 39 % (ref 39.0–52.0)
Hemoglobin: 13 g/dL (ref 13.0–17.0)
Immature Granulocytes: 1 %
Lymphocytes Relative: 10 %
Lymphs Abs: 1.6 10*3/uL (ref 0.7–4.0)
MCH: 30.4 pg (ref 26.0–34.0)
MCHC: 33.3 g/dL (ref 30.0–36.0)
MCV: 91.1 fL (ref 80.0–100.0)
Monocytes Absolute: 1 10*3/uL (ref 0.1–1.0)
Monocytes Relative: 6 %
Neutro Abs: 12.9 10*3/uL — ABNORMAL HIGH (ref 1.7–7.7)
Neutrophils Relative %: 83 %
Platelets: 228 10*3/uL (ref 150–400)
RBC: 4.28 MIL/uL (ref 4.22–5.81)
RDW: 13 % (ref 11.5–15.5)
WBC: 15.6 10*3/uL — ABNORMAL HIGH (ref 4.0–10.5)
nRBC: 0 % (ref 0.0–0.2)

## 2018-11-29 LAB — SALICYLATE LEVEL: Salicylate Lvl: <7 mg/dL (ref 2.8–30.0)

## 2018-11-29 LAB — CK
Total CK: 743 U/L — ABNORMAL HIGH (ref 49–397)
Total CK: 739 U/L — ABNORMAL HIGH (ref 49–397)

## 2018-11-29 LAB — ACETAMINOPHEN LEVEL: Acetaminophen (Tylenol), Serum: <10 ug/mL — ABNORMAL LOW (ref 10–30)

## 2018-11-29 MED ORDER — SODIUM CHLORIDE 0.9 % IV BOLUS
1000.0000 mL | Freq: Once | INTRAVENOUS | Status: AC
  Administered 2018-11-29: 1000 mL via INTRAVENOUS

## 2018-11-29 NOTE — ED Notes (Signed)
Return call from [place] on call social worker Leia Alf and informed her patient is here in the ED. Ms. Redmond Pulling also informed Leanord Asal who is the assigned legal guardian with DSS.

## 2018-11-29 NOTE — ED Notes (Signed)
This RN spoke with Dolores Patty, owner of [facility]. She is aware the patient is here and she will come pick him up when he is discharged.

## 2018-11-29 NOTE — ED Notes (Signed)
Pt with redness and irritation noted to bilateral feet from the ankle region down. Pt has been walking in wet shoes with no socks for over 24 hours. CMS intact.

## 2018-11-29 NOTE — ED Notes (Signed)
[place] on call social worker paged for legal guardian notification.

## 2018-11-29 NOTE — ED Provider Notes (Signed)
[facility] Emergency Department Provider Note   ____________________________________________   First MD Initiated Contact with Patient 11/29/18 1953     (approximate)  I have reviewed the triage vital signs and the nursing notes.   HISTORY  Chief Complaint Medical Clearance and Foot Pain History limited by patient not responding to most questions  HPI Paul Vaughan is a 63 y.o. male who left his group home yesterday.  Apparently he was walking around all yesterday and a good part of today.  When he got here he says he felt okay but he has been walking in his shoes which are now wet with sweat and probably rain with no socks and he has some blisters and abrasions developing on his feet.  He has no complaints of chest or belly pain or back pain.  Is difficult to get a history from him though.         No past medical history on file.  Patient Active Problem List   Diagnosis Date Noted  . 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  Prior to Admission medications   Medication Sig Start Date End Date Taking? Authorizing Provider  hydrOXYzine (VISTARIL) 50 MG capsule Take 1 capsule (50 mg total) by mouth 3 (three) times daily as needed for anxiety. 04/10/17   Clapacs, Madie Reno, MD  QUEtiapine (SEROQUEL) 50 MG tablet Take 1 tablet (50 mg total) by mouth 3 (three) times daily. 08/20/18   Earleen Newport, MD    Allergies Penicillins and Sulfa antibiotics  No family history on file.  Social History Social History   Tobacco Use  . Smoking status: Current Every Day Smoker    Packs/day: 1.00    Types: Cigarettes  . Smokeless tobacco: Never Used  Substance Use Topics  . Alcohol use: No  . Drug use: No    Review of Systems Unable to obtain.  Patient says he does not have any virus.   ____________________________________________   PHYSICAL EXAM:  VITAL SIGNS: ED Triage Vitals  Enc Vitals Group     BP 11/29/18 1947 (!) 152/76     Pulse Rate 11/29/18 1947 86     Resp --      Temp 11/29/18 1947 98 F (36.7 C)     Temp Source 11/29/18 1947 Oral     SpO2 11/29/18 1947 98 %     Weight 11/29/18 1949 130 lb (59 kg)     Height 11/29/18 1949 5\' 10"  (1.778 m)     Head Circumference --      Peak Flow --      Pain Score --      Pain Loc --      Pain Edu? --      Excl. in [place]? --     Constitutional: Alert  Well appearing and in no acute distress. Eyes: Conjunctivae are normal.  Head: Atraumatic. Nose: No congestion/rhinnorhea. Mouth/Throat: Mucous membranes are moist.   Neck: No stridor.   Cardiovascular: Normal rate, regular rhythm. Grossly normal heart sounds.  Good peripheral circulation. Respiratory: Normal respiratory effort.  No retractions. Lungs CTAB. Gastrointestinal: Soft and nontender. No distention. No abdominal bruits.  Musculoskeletal: Patient does not complain of pain in his legs.  He has a number of small blisters on the soles of his feet the largest one is about the size of a dime on his left great toe.  Additionally he has a purple area consistent with a count of a deep bruise on the dorsal surface of the left foot near the third fourth and fifth toe.  He has an abrasion over the MCP of the great toe on medial side of the foot.  He also has a bunch of red marks on his feet apparently from the shoes. Neurologic:  Normal speech and language. No gross focal neurologic deficits are appreciated.  Skin:  Skin is warm, dry and intact.  Except for see musculoskeletal for description of feet above   ____________________________________________   LABS (all labs ordered are listed, but only abnormal results are displayed)  Labs Reviewed  COMPREHENSIVE METABOLIC PANEL - Abnormal; Notable for the following components:      Result Value   Sodium 134 (*)     CO2 18 (*)    BUN 56 (*)    Creatinine, Ser 1.75 (*)    Total Bilirubin 1.6 (*)    GFR calc non Af Amer 41 (*)    GFR calc Af Amer 47 (*)    All other components within normal limits  CBC WITH DIFFERENTIAL/PLATELET - Abnormal; Notable for the following components:   WBC 15.6 (*)    Neutro Abs 12.9 (*)    Abs Immature Granulocytes 0.09 (*)    All other components within normal limits  URINALYSIS, COMPLETE (UACMP) WITH MICROSCOPIC - Abnormal; Notable for the following components:   Color, Urine YELLOW (*)    APPearance HAZY (*)    Ketones, ur 20 (*)    All other components within normal limits  CK - Abnormal; Notable for the following components:   Total CK 743 (*)    All other components within normal limits  ACETAMINOPHEN LEVEL - Abnormal; Notable for the following components:   Acetaminophen (Tylenol), Serum <10 (*)    All other components within normal limits  CK - Abnormal; Notable for the following components:   Total CK 739 (*)    All other components within normal limits  ETHANOL  SALICYLATE LEVEL  URINE DRUG SCREEN, QUALITATIVE ([facility] ONLY)   ____________________________________________  EKG   ____________________________________________  RADIOLOGY  ED MD interpretation:    Official radiology report(s): No results found.  ____________________________________________   PROCEDURES  Procedure(s) performed (including Critical Care):  Procedures   ____________________________________________   INITIAL IMPRESSION / ASSESSMENT AND PLAN / ED COURSE  Patient CK is trending down with fluids.  Will give him some more fluids let him go home and drink a lot.  Have him recheck his CK again tomorrow.              ____________________________________________   FINAL CLINICAL IMPRESSION(S) / ED DIAGNOSES  Final diagnoses:  Encounter for medical clearance for patient hold   Also elevated creatinine kinase but this does not seem to come up in the  computer  ED Discharge Orders    None       Note:  This document was prepared using Dragon voice recognition software and may include unintentional dictation errors.    Arnaldo NatalMalinda,  F, MD 11/30/18 [phone]

## 2018-11-29 NOTE — Discharge Instructions (Signed)
Make sure you are drinking plenty of fluids.  Have your doctor see you and check your CK tomorrow.  It was elevated today but trending downwards with fluids.  Please do not spend the whole day walking again.

## 2018-11-29 NOTE — ED Notes (Signed)
Pt given sandwich tray and sprite.  

## 2018-11-29 NOTE — ED Triage Notes (Signed)
EMS pt to [facility]. Pt lives at [facility] but left the facility around 5 pm on 11/28/18. [place] Alert was placed on the patient and pt was found this evening by [employer]. Pt denies SI/HI. States his feet hurt and he is hungry.

## 2018-11-30 NOTE — ED Notes (Signed)
Verified care giver had no questions regarding discharge and had her sign discharge instructions.

## 2019-04-13 ENCOUNTER — Other Ambulatory Visit: Payer: Self-pay | Admitting: Psychiatry

## 2019-11-17 ENCOUNTER — Emergency Department
Admission: EM | Admit: 2019-11-17 | Discharge: 2019-11-17 | Discharge status: Against Medical Advice | Payer: Medicare Other | Attending: Emergency Medicine | Admitting: Emergency Medicine

## 2019-11-17 ENCOUNTER — Other Ambulatory Visit: Payer: Self-pay

## 2019-11-17 ENCOUNTER — Emergency Department: Payer: Medicare Other

## 2019-11-17 ENCOUNTER — Encounter: Payer: Self-pay | Admitting: Emergency Medicine

## 2019-11-17 DIAGNOSIS — N179 Acute kidney failure, unspecified: Secondary | ICD-10-CM

## 2019-11-17 DIAGNOSIS — F17210 Nicotine dependence, cigarettes, uncomplicated: Secondary | ICD-10-CM | POA: Diagnosis not present

## 2019-11-17 DIAGNOSIS — E860 Dehydration: Secondary | ICD-10-CM | POA: Diagnosis not present

## 2019-11-17 DIAGNOSIS — W1830XA Fall on same level, unspecified, initial encounter: Secondary | ICD-10-CM | POA: Insufficient documentation

## 2019-11-17 DIAGNOSIS — S37009A Unspecified injury of unspecified kidney, initial encounter: Secondary | ICD-10-CM | POA: Diagnosis not present

## 2019-11-17 DIAGNOSIS — Y939 Activity, unspecified: Secondary | ICD-10-CM | POA: Diagnosis not present

## 2019-11-17 DIAGNOSIS — Y929 Unspecified place or not applicable: Secondary | ICD-10-CM | POA: Insufficient documentation

## 2019-11-17 DIAGNOSIS — Y999 Unspecified external cause status: Secondary | ICD-10-CM | POA: Insufficient documentation

## 2019-11-17 DIAGNOSIS — R55 Syncope and collapse: Secondary | ICD-10-CM | POA: Diagnosis present

## 2019-11-17 LAB — CBC WITH DIFFERENTIAL/PLATELET
Abs Immature Granulocytes: 0.06 10*3/uL (ref 0.00–0.07)
Basophils Absolute: 0.1 10*3/uL (ref 0.0–0.1)
Basophils Relative: 1 %
Eosinophils Absolute: 0.1 10*3/uL (ref 0.0–0.5)
Eosinophils Relative: 1 %
HCT: 30.3 % — ABNORMAL LOW (ref 39.0–52.0)
Hemoglobin: 10.3 g/dL — ABNORMAL LOW (ref 13.0–17.0)
Immature Granulocytes: 1 %
Lymphocytes Relative: 18 %
Lymphs Abs: 1.7 10*3/uL (ref 0.7–4.0)
MCH: 30.7 pg (ref 26.0–34.0)
MCHC: 34 g/dL (ref 30.0–36.0)
MCV: 90.2 fL (ref 80.0–100.0)
Monocytes Absolute: 0.7 10*3/uL (ref 0.1–1.0)
Monocytes Relative: 7 %
Neutro Abs: 7 10*3/uL (ref 1.7–7.7)
Neutrophils Relative %: 72 %
Platelets: 236 10*3/uL (ref 150–400)
RBC: 3.36 MIL/uL — ABNORMAL LOW (ref 4.22–5.81)
RDW: 13.2 % (ref 11.5–15.5)
WBC: 9.7 10*3/uL (ref 4.0–10.5)
nRBC: 0 % (ref 0.0–0.2)

## 2019-11-17 LAB — BASIC METABOLIC PANEL
Anion gap: 9 (ref 5–15)
BUN: 63 mg/dL — ABNORMAL HIGH (ref 8–23)
CO2: 23 mmol/L (ref 22–32)
Calcium: 8.8 mg/dL — ABNORMAL LOW (ref 8.9–10.3)
Chloride: 104 mmol/L (ref 98–111)
Creatinine, Ser: 4.05 mg/dL — ABNORMAL HIGH (ref 0.61–1.24)
GFR calc Af Amer: 17 mL/min — ABNORMAL LOW (ref >60)
GFR calc non Af Amer: 15 mL/min — ABNORMAL LOW (ref >60)
Glucose, Bld: 115 mg/dL — ABNORMAL HIGH (ref 70–99)
Potassium: 4.5 mmol/L (ref 3.5–5.1)
Sodium: 136 mmol/L (ref 135–145)

## 2019-11-17 LAB — CK: Total CK: 362 U/L (ref 49–397)

## 2019-11-17 LAB — PROTIME-INR
INR: 1 (ref 0.8–1.2)
Prothrombin Time: 13.2 seconds (ref 11.4–15.2)

## 2019-11-17 LAB — TROPONIN I (HIGH SENSITIVITY): Troponin I (High Sensitivity): 7 ng/L (ref <18)

## 2019-11-17 MED ORDER — LACTATED RINGERS IV BOLUS: 1000.0000 mL | Freq: Once | INTRAVENOUS | Status: DC

## 2019-11-17 NOTE — ED Notes (Signed)
Pt refused to stay inside facility or sign out AMA.

## 2019-11-17 NOTE — ED Triage Notes (Signed)
Pt arrived via [facility] from [facility] in [place]. Pt had syncopal episode this evening at facility. Per EMS pt had been outside all day then came inside facility where he had syncopal episode w/ emesis and hit his head. Pt was hypotensive pta at 88/40. EMS gve bolus 500 NS and BP was 103/49.

## 2019-11-17 NOTE — ED Notes (Signed)
Spoke w/ EDP and observed pt walking down hall towards lobby. Security called. Called Pt's group home and spoke w/ Jamesetta So who said she would call Francena Hanly, the facility administrator.

## 2019-11-17 NOTE — ED Notes (Signed)
Attempted to call pt's legal guardian w/o success. Left voicemail.

## 2019-11-17 NOTE — ED Provider Notes (Signed)
[facility] Emergency Department Provider Note  ____________________________________________   First MD Initiated Contact with Patient 11/17/19 1950     (approximate)  I have reviewed the triage vital signs and the nursing notes.   HISTORY  Chief Complaint Loss of Consciousness    HPI Daveyon Kitchings is a 64 y.o. male with history of intellectual disability, conduct disorder, here with syncope.  The patient was reportedly outside all day in the heat.  He got up and walked inside.  He then was witnessed to have a syncopal episode.  Reportedly lost consciousness, falling to the ground.  He then regained consciousness.  He  was reportedly hypotensive with systolic in the 80s with EMS, which improved significantly with 500 cc of fluid.  He states he now feels better.  He is adamant he had no headache, chest pain, shortness of breath, or other complaints.  He does not believe he has had any recent medication changes.  He does admit he was out in the heat all day and did not necessarily drink or eat very much.  Denies any other complaints.       History reviewed. No pertinent past medical history.  Patient Active Problem List   Diagnosis Date Noted  . 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 No pertinent surgical history.  Prior to Admission medications   Medication Sig Start Date End Date Taking? Authorizing Provider  hydrOXYzine (VISTARIL) 50 MG capsule Take 1 capsule (50 mg total) by mouth 3 (three) times daily as needed for anxiety. 04/10/17   Clapacs, Jackquline Denmark, MD  QUEtiapine (SEROQUEL) 50 MG tablet TAKE ONE TABLET BY MOUTH THREE TIMES A DAY 04/24/19   Clapacs, Jackquline Denmark, MD    Allergies Penicillins and Sulfa antibiotics  History reviewed. No pertinent family history.  Social History Social History   Tobacco Use  . Smoking status: Current  Every Day Smoker    Packs/day: 1.00    Types: Cigarettes  . Smokeless tobacco: Never Used  Substance Use Topics  . Alcohol use: No  . Drug use: No    Review of Systems  Review of Systems  Constitutional: Negative for chills, fatigue and fever.  HENT: Negative for sore throat.   Respiratory: Negative for shortness of breath.   Cardiovascular: Negative for chest pain.  Gastrointestinal: Negative for abdominal pain.  Genitourinary: Negative for flank pain.  Musculoskeletal: Negative for neck pain.  Skin: Negative for rash and wound.  Allergic/Immunologic: Negative for immunocompromised state.  Neurological: Positive for syncope. Negative for weakness and numbness.  Hematological: Does not bruise/bleed easily.  All other systems reviewed and are negative.    ____________________________________________  PHYSICAL EXAM:      VITAL SIGNS: ED Triage Vitals  Enc Vitals Group     BP 11/17/19 1943 116/65     Pulse Rate 11/17/19 1943 69     Resp 11/17/19 1943 13     Temp 11/17/19 1943 (!) 97.5 F (36.4 C)     Temp Source 11/17/19 1943 Oral     SpO2 11/17/19 1943 99 %     Weight 11/17/19 1950 150 lb (68 kg)     Height 11/17/19 1950 6' (1.829 m)     Head Circumference --      Peak Flow --      Pain Score 11/17/19 1944 2     Pain Loc --      Pain  Edu? --      Excl. in GC? --      Physical Exam Vitals and nursing note reviewed.  Constitutional:      General: He is not in acute distress.    Appearance: He is well-developed.  HENT:     Head: Normocephalic and atraumatic.     Mouth/Throat:     Mouth: Mucous membranes are dry.  Eyes:     Conjunctiva/sclera: Conjunctivae normal.  Cardiovascular:     Rate and Rhythm: Normal rate and regular rhythm.     Heart sounds: Normal heart sounds. No murmur heard.  No friction rub.  Pulmonary:     Effort: Pulmonary effort is normal. No respiratory distress.     Breath sounds: Normal breath sounds. No wheezing or rales.  Abdominal:       General: There is no distension.     Palpations: Abdomen is soft.     Tenderness: There is no abdominal tenderness.  Musculoskeletal:     Cervical back: Neck supple.  Skin:    General: Skin is warm.     Capillary Refill: Capillary refill takes less than 2 seconds.  Neurological:     Mental Status: He is alert and oriented to person, place, and time.     Motor: No abnormal muscle tone.     Comments: Neurological Exam:  Mental Status: Alert and oriented to person, place, and time. Attention and concentration normal. Speech clear. Recent memory is intact. Cranial Nerves: Visual fields grossly intact. EOMI and PERRLA. No nystagmus noted. Facial sensation intact at forehead, maxillary cheek, and chin/mandible bilaterally. No facial asymmetry or weakness. Hearing grossly normal. Uvula is midline, and palate elevates symmetrically. Normal SCM and trapezius strength. Tongue midline without fasciculations. Motor: Muscle strength 5/5 in proximal and distal UE and LE bilaterally. No pronator drift. Muscle tone normal.  Sensation: Intact to light touch in upper and lower extremities distally bilaterally.  Gait: Normal without ataxia. Coordination: Normal FTN bilaterally.          ____________________________________________   LABS (all labs ordered are listed, but only abnormal results are displayed)  Labs Reviewed  CBC WITH DIFFERENTIAL/PLATELET - Abnormal; Notable for the following components:      Result Value   RBC 3.36 (*)    Hemoglobin 10.3 (*)    HCT 30.3 (*)    All other components within normal limits  BASIC METABOLIC PANEL - Abnormal; Notable for the following components:   Glucose, Bld 115 (*)    BUN 63 (*)    Creatinine, Ser 4.05 (*)    Calcium 8.8 (*)    GFR calc non Af Amer 15 (*)    GFR calc Af Amer 17 (*)    All other components within normal limits  CK  PROTIME-INR  TROPONIN I (HIGH SENSITIVITY)  TROPONIN I (HIGH SENSITIVITY)     ____________________________________________  EKG: Normal sinus rhythm, ventricular rate 69.  QRS 99, QTc 460.  No acute ST elevations or depressions.  No arrhythmia.  No ectopy. ________________________________________  RADIOLOGY All imaging, including plain films, CT scans, and ultrasounds, independently reviewed by me, and interpretations confirmed via formal radiology reads.  ED MD interpretation:   CT head: No acute abnormality CT C-spine: No acute cervical fracture or malalignment  Official radiology report(s): CT Head Wo Contrast  Result Date: 11/17/2019 CLINICAL DATA:  Headache, head trauma.  Syncopal episode. EXAM: CT HEAD WITHOUT CONTRAST TECHNIQUE: Contiguous axial images were obtained from the base of the skull through the  vertex without intravenous contrast. COMPARISON:  None. FINDINGS: Brain: No intracranial hemorrhage, mass effect, or midline shift. Normal brain volume for age. No hydrocephalus. The basilar cisterns are patent. No evidence of territorial infarct or acute ischemia. No extra-axial or intracranial fluid collection. Vascular: Atherosclerosis of skullbase vasculature without hyperdense vessel or abnormal calcification. Skull: No fracture or focal lesion. Sinuses/Orbits: Paranasal sinuses and mastoid air cells are clear. The visualized orbits are unremarkable. There is debris in the left external auditory canal. Other: None. IMPRESSION: Unremarkable head CT for age. No acute intracranial abnormality. No skull fracture. Electronically Signed   By: Narda RutherfordMelanie  Sanford M.D.   On: 11/17/2019 20:41   CT Cervical Spine Wo Contrast  Result Date: 11/17/2019 CLINICAL DATA:  Polytrauma, critical, head/C-spine injury suspected Syncope. EXAM: CT CERVICAL SPINE WITHOUT CONTRAST TECHNIQUE: Multidetector CT imaging of the cervical spine was performed without intravenous contrast. Multiplanar CT image reconstructions were also generated. COMPARISON:  None. FINDINGS: Alignment: Normal.  Skull base and vertebrae: No acute fracture. Vertebral body heights are maintained. The dens and skull base are intact. Soft tissues and spinal canal: No prevertebral fluid or swelling. No visible canal hematoma. Disc levels: Endplate spurring at multiple levels with preservation of disc spaces. Upper chest: Biapical pleuroparenchymal scarring. Other: Carotid calcifications. IMPRESSION: Mild degenerative change in the cervical spine without acute fracture or subluxation. Electronically Signed   By: Narda RutherfordMelanie  Sanford M.D.   On: 11/17/2019 20:44    ____________________________________________  PROCEDURES   Procedure(s) performed (including Critical Care):  Procedures  ____________________________________________  INITIAL IMPRESSION / MDM / ASSESSMENT AND PLAN / ED COURSE  As part of my medical decision making, I reviewed the following data within the electronic MEDICAL RECORD NUMBER Nursing notes reviewed and incorporated, Old chart reviewed, Notes from prior ED visits, and [place] Controlled Substance Database       *Gunnar Bullaheodore Portner was evaluated in Emergency Department on 11/17/2019 for the symptoms described in the history of present illness. He was evaluated in the context of the global COVID-19 pandemic, which necessitated consideration that the patient might be at risk for infection with the SARS-CoV-2 virus that causes COVID-19. Institutional protocols and algorithms that pertain to the evaluation of patients at risk for COVID-19 are in a state of rapid change based on information released by regulatory bodies including the CDC and federal and state organizations. These policies and algorithms were followed during the patient's care in the ED.  Some ED evaluations and interventions may be delayed as a result of limited staffing during the pandemic.*     Medical Decision Making: 64 year old male here with syncopal episode after standing up after being in the heat all day.  Patient appears  dehydrated clinically.  Suspect orthostatic syncope.  EKG nonischemic, no chest pain, no signs of ACS, PE, or other cardiac abnormality.  He has no focal neurological deficits.  There is no seizure-like activity.  He feels markedly improved with fluids.  BMP shows significant AKI.  BUN and creatinine are both significantly elevated.  This is consistent with likely dehydration.  He has no evidence of urinary retention clinically.  CK is normal.  I discussed this with the patient, who expresses understanding but refuses to stay.  Patient has a history of previous AKI and dehydration in similar settings.  Patient does have a legal guardian in a group home, but appears awake, is able to voice why we are concerned including the risk of dialysis and kidney failure, and refuses additional treatment.  I discussed with  the group home and attempted to contact the legal guardian, who is aware and will continue to encourage the patient to drink and return.  At this time, patient will leave AMA, and he does demonstrate appropriate capacity.  I have encouraged him to drink plenty of fluids, stay out of the heat for the next several days, and follow-up with a doctor.  Again, I discussed this with his group home guardian as well.  ____________________________________________  FINAL CLINICAL IMPRESSION(S) / ED DIAGNOSES  Final diagnoses:  AKI (acute kidney injury) (HCC)  Dehydration     MEDICATIONS GIVEN DURING THIS VISIT:  Medications  lactated ringers bolus 1,000 mL (has no administration in time range)     ED Discharge Orders    None       Note:  This document was prepared using Dragon voice recognition software and may include unintentional dictation errors.   Shaune Pollack, MD 11/17/19 2152

## 2019-11-17 NOTE — ED Notes (Signed)
Alerted security staff by phone and spoke w/ guard to advise pt was leaving AMA but was resident of group home and needed to be observed until ride came.

## 2019-11-17 NOTE — ED Notes (Signed)
Called group home again and was advised by Jamesetta So that Francena Hanly the facility administrator was en route to pick up.

## 2019-11-17 NOTE — ED Notes (Signed)
Found pt outside of room. Pt asked for Ginger Ale, told him that was fine we just needed him in room. Pt asked "when can I leave?" I told him we just needed to finish checking him out.

## 2019-11-17 NOTE — ED Notes (Signed)
Found pt in room w/ IV pulled out. Bandaged pt up. Pt agitated asking when he can get out of here.

## 2019-11-19 ENCOUNTER — Encounter: Payer: Self-pay | Admitting: Emergency Medicine

## 2019-11-19 ENCOUNTER — Other Ambulatory Visit: Payer: Self-pay

## 2019-11-19 ENCOUNTER — Emergency Department
Admission: EM | Admit: 2019-11-19 | Discharge: 2019-12-14 | Disposition: A | Discharge status: Other Acute Inpatient Hospital | Payer: Medicare Other | Attending: Emergency Medicine | Admitting: Emergency Medicine

## 2019-11-19 DIAGNOSIS — F259 Schizoaffective disorder, unspecified: Secondary | ICD-10-CM | POA: Diagnosis not present

## 2019-11-19 DIAGNOSIS — Z046 Encounter for general psychiatric examination, requested by authority: Secondary | ICD-10-CM | POA: Diagnosis present

## 2019-11-19 DIAGNOSIS — R451 Restlessness and agitation: Secondary | ICD-10-CM | POA: Insufficient documentation

## 2019-11-19 DIAGNOSIS — F4325 Adjustment disorder with mixed disturbance of emotions and conduct: Secondary | ICD-10-CM | POA: Diagnosis not present

## 2019-11-19 DIAGNOSIS — F919 Conduct disorder, unspecified: Secondary | ICD-10-CM | POA: Insufficient documentation

## 2019-11-19 DIAGNOSIS — F17210 Nicotine dependence, cigarettes, uncomplicated: Secondary | ICD-10-CM | POA: Diagnosis not present

## 2019-11-19 DIAGNOSIS — R4183 Borderline intellectual functioning: Secondary | ICD-10-CM | POA: Diagnosis not present

## 2019-11-19 DIAGNOSIS — Z20822 Contact with and (suspected) exposure to covid-19: Secondary | ICD-10-CM | POA: Insufficient documentation

## 2019-11-19 DIAGNOSIS — R4689 Other symptoms and signs involving appearance and behavior: Secondary | ICD-10-CM

## 2019-11-19 DIAGNOSIS — Z79899 Other long term (current) drug therapy: Secondary | ICD-10-CM | POA: Diagnosis not present

## 2019-11-19 DIAGNOSIS — Y907 Blood alcohol level of 200-239 mg/100 ml: Secondary | ICD-10-CM | POA: Diagnosis not present

## 2019-11-19 NOTE — ED Triage Notes (Signed)
Pt arrived to ED from [facility] in [place]. Pt has not been taking his medications the past few days and has not eaten. Pt left last night and slept in the woods. [place] police [place] and when taken back to facility staff members expressed concerns about pt wellbeing. Pt denies SI or HI and states he has been taking his medications. Pt states he left last night because he was "put out of the home".

## 2019-11-20 LAB — URINALYSIS, COMPLETE (UACMP) WITH MICROSCOPIC
Bacteria, UA: NONE SEEN
Bilirubin Urine: NEGATIVE
Glucose, UA: NEGATIVE mg/dL
Hgb urine dipstick: NEGATIVE
Ketones, ur: 5 mg/dL — AB
Leukocytes,Ua: NEGATIVE
Nitrite: NEGATIVE
Protein, ur: NEGATIVE mg/dL
Specific Gravity, Urine: 1.018 (ref 1.005–1.030)
Squamous Epithelial / HPF: NONE SEEN (ref 0–5)
pH: 5 (ref 5.0–8.0)

## 2019-11-20 LAB — COMPREHENSIVE METABOLIC PANEL
ALT: 18 U/L (ref 0–44)
AST: 37 U/L (ref 15–41)
Albumin: 4.9 g/dL (ref 3.5–5.0)
Alkaline Phosphatase: 92 U/L (ref 38–126)
Anion gap: 10 (ref 5–15)
BUN: 74 mg/dL — ABNORMAL HIGH (ref 8–23)
CO2: 24 mmol/L (ref 22–32)
Calcium: 9.4 mg/dL (ref 8.9–10.3)
Chloride: 100 mmol/L (ref 98–111)
Creatinine, Ser: 2.48 mg/dL — ABNORMAL HIGH (ref 0.61–1.24)
GFR calc Af Amer: 31 mL/min — ABNORMAL LOW (ref >60)
GFR calc non Af Amer: 26 mL/min — ABNORMAL LOW (ref >60)
Glucose, Bld: 109 mg/dL — ABNORMAL HIGH (ref 70–99)
Potassium: 5 mmol/L (ref 3.5–5.1)
Sodium: 134 mmol/L — ABNORMAL LOW (ref 135–145)
Total Bilirubin: 1.1 mg/dL (ref 0.3–1.2)
Total Protein: 8.6 g/dL — ABNORMAL HIGH (ref 6.5–8.1)

## 2019-11-20 LAB — CBC
HCT: 34.8 % — ABNORMAL LOW (ref 39.0–52.0)
Hemoglobin: 11.9 g/dL — ABNORMAL LOW (ref 13.0–17.0)
MCH: 31 pg (ref 26.0–34.0)
MCHC: 34.2 g/dL (ref 30.0–36.0)
MCV: 90.6 fL (ref 80.0–100.0)
Platelets: 307 10*3/uL (ref 150–400)
RBC: 3.84 MIL/uL — ABNORMAL LOW (ref 4.22–5.81)
RDW: 13 % (ref 11.5–15.5)
WBC: 16.7 10*3/uL — ABNORMAL HIGH (ref 4.0–10.5)
nRBC: 0 % (ref 0.0–0.2)

## 2019-11-20 LAB — URINE DRUG SCREEN, QUALITATIVE (ARMC ONLY)
Amphetamines, Ur Screen: NOT DETECTED
Barbiturates, Ur Screen: NOT DETECTED
Benzodiazepine, Ur Scrn: NOT DETECTED
Cannabinoid 50 Ng, Ur ~~LOC~~: NOT DETECTED
Cocaine Metabolite,Ur ~~LOC~~: NOT DETECTED
MDMA (Ecstasy)Ur Screen: NOT DETECTED
Methadone Scn, Ur: NOT DETECTED
Opiate, Ur Screen: NOT DETECTED
Phencyclidine (PCP) Ur S: NOT DETECTED
Tricyclic, Ur Screen: NOT DETECTED

## 2019-11-20 LAB — ETHANOL: Alcohol, Ethyl (B): <10 mg/dL (ref <10)

## 2019-11-20 LAB — ACETAMINOPHEN LEVEL: Acetaminophen (Tylenol), Serum: <10 ug/mL — ABNORMAL LOW (ref 10–30)

## 2019-11-20 LAB — SALICYLATE LEVEL: Salicylate Lvl: <7 mg/dL — ABNORMAL LOW (ref 7.0–30.0)

## 2019-11-20 LAB — SARS CORONAVIRUS 2 BY RT PCR (HOSPITAL ORDER, PERFORMED IN ~~LOC~~ HOSPITAL LAB): SARS Coronavirus 2: NEGATIVE

## 2019-11-20 NOTE — ED Notes (Signed)
Hourly rounding reveals patient is awake and in hallway bed. Patient stated he wants to walk around and appears dissatisfied because he cant pace the halls, stable, in no acute distress. Q15 minute rounds and monitoring via Psychologist, counselling to continue.

## 2019-11-20 NOTE — ED Notes (Signed)
Patient attempted to stand in front of room 24, patient was told by officer Toler he was not able to stand of front of someone elses door, patient said "ok, I just wanted to move my legs"

## 2019-11-20 NOTE — BH Assessment (Signed)
Attempt was made to contact patient's legal guardian Lazaro Arms [phone] to update patient's recommendation for treatment, no answer, voicemail was left to return phone call.

## 2019-11-20 NOTE — BH Assessment (Signed)
Patient did not want to participate in assessment at this time. Attempt was made to obtain collateral information from group home staff that IVC'd patient at [facility] [place] [phone] but no answer, voicemail was left to return phone call. Will attempt to assess patient at a later time.

## 2019-11-20 NOTE — ED Notes (Addendum)
Patient sleeping moans with verbal arousal, does not want to open eyes, answer qiuestions or get up to go to bathroom when offered. Will continue to monitor.

## 2019-11-20 NOTE — BH Assessment (Signed)
Assessment Note  Paul Vaughan is an 64 y.o. male who presents to the ER via law enforcement, after his Care Home voiced concerns about the recent changes in his behaviors and the state of his mental health. Per the patient, hes unsure why he was brought to the ER. Throughout the interview, he denied SI/HI and AV/H. He denies any changes in his behaviors and reports of having no problems at the care home.  Per the Care Home [facility] ([phone]) he started to wander from the Group Home and the times he stayed away was increasing. At times he would stare and be confused about what was going on and who the staff members were. For the last several weeks, he has gone to different neighbors homes, knocking on their windows and staring through the windows at them. Law enforcement has been called several times. Last night, he was at train deport standing still and staring. Law enforcement brought him back to the living facility. While at the bus depot or in route home, he defecated on his self. Due patient presentation and affect, law enforcement brought him to the ER for an evaluation. Care Home states, they have notice him having behavior changes approximately every six months. The patient has been in the same home for two years. They also suspect the patient has been taking his medications because they found pills in his room.  Past Medical History: History reviewed. No pertinent past medical history.  History reviewed. No pertinent surgical history.  Family History: No family history on file.  Social History:  reports that he has been smoking cigarettes. He has been smoking about 1.00 pack per day. He has never used smokeless tobacco. He reports that he does not drink alcohol and does not use drugs.  Additional Social History:  Alcohol / Drug Use Pain Medications: See PTA Prescriptions: See PTA Over the Counter: See PTA History of alcohol / drug use?: No history of alcohol / drug  abuse Longest period of sobriety (when/how long): None reported  CIWA: CIWA-Ar BP: (!) 125/58 Pulse Rate: 69 COWS:    Allergies:  Allergies  Allergen Reactions   Penicillins Rash and Swelling    .Has patient had a PCN reaction causing immediate rash, facial/tongue/throat swelling, SOB or lightheadedness with hypotension: Unknown Has patient had a PCN reaction causing severe rash involving mucus membranes or skin necrosis: Unknown Has patient had a PCN reaction that required hospitalization: Unknown Has patient had a PCN reaction occurring within the last 10 years: Unknown If all of the above answers are "NO", then may proceed with Cephalosporin use.    Sulfa Antibiotics Rash and Swelling    Home Medications: (Not in a hospital admission)   OB/GYN Status:  No LMP for male patient.  General Assessment Data Location of Assessment: [facility] ED TTS Assessment: In system Is this a Tele or Face-to-Face Assessment?: Face-to-Face Is this an Initial Assessment or a Re-assessment for this encounter?: Initial Assessment Language Other than English: No Living Arrangements: In Group Home: (Comment: Name of Group Home) ([facility]) What gender do you identify as?: Male Date Telepsych consult ordered in [facility]: 11/20/19 Time Telepsych consult ordered in [facility]: 0029 Marital status: Single Pregnancy Status: No Living Arrangements: Group Home ([facility]) Can pt return to current living arrangement?: No Admission Status: Involuntary Petitioner: Police Is patient capable of signing voluntary admission?: No (Under IVC) Referral Source: Self/Family/Friend Insurance type: Medicare A&B  Medical Screening Exam [facility] Walk-in ONLY) Medical Exam completed: Yes  Crisis Care Plan Living Arrangements: Group Home ([facility]) Legal Guardian: Other: Ardean Larsen Hick[facility] [phone])) Name of Psychiatrist: [facility] Name of Therapist: [facility]  Education Status Is patient currently in  school?: No Is the patient employed, unemployed or receiving disability?: Unemployed, Receiving disability income  Risk to self with the past 6 months Suicidal Ideation: No Has patient been a risk to self within the past 6 months prior to admission? : No Suicidal Intent: No Has patient had any suicidal intent within the past 6 months prior to admission? : No Is patient at risk for suicide?: No Suicidal Plan?: No Has patient had any suicidal plan within the past 6 months prior to admission? : No Access to Means: No What has been your use of drugs/alcohol within the last 12 months?: Reports of none Previous Attempts/Gestures: No How many times?: 0 Other Self Harm Risks: Reports of none Triggers for Past Attempts: None known Intentional Self Injurious Behavior: None Family Suicide History: Unknown Recent stressful life event(s): Other (Comment) (Stop taking medications, per Group Home, stop taking meds.) Persecutory voices/beliefs?: No Depression: No Substance abuse history and/or treatment for substance abuse?: No Suicide prevention information given to non-admitted patients: Not applicable  Risk to Others within the past 6 months Homicidal Ideation: No Does patient have any lifetime risk of violence toward others beyond the six months prior to admission? : No Thoughts of Harm to Others: No Current Homicidal Intent: No Current Homicidal Plan: No Access to Homicidal Means: No Identified Victim: Reports of none History of harm to others?: No Assessment of Violence: None Noted Violent Behavior Description: Reports of none Does patient have access to weapons?: No Criminal Charges Pending?: No Does patient have a court date: No Is patient on probation?: No  Psychosis Hallucinations: None noted Delusions: None noted  Mental Status Report Appearance/Hygiene: In scrubs, Unremarkable Eye Contact: Good Motor Activity: Freedom of movement, Unremarkable Speech: Logical/coherent,  Unremarkable Level of Consciousness: Alert Mood: Pleasant Affect: Appropriate to circumstance Anxiety Level: Minimal Thought Processes: Coherent, Relevant Judgement: Unimpaired Orientation: Person, Place, Time, Situation, Appropriate for developmental age Obsessive Compulsive Thoughts/Behaviors: Minimal  Cognitive Functioning Concentration: Normal Memory: Remote Intact, Recent Impaired Is patient IDD: No Insight: Fair Impulse Control: Fair Appetite: Fair Have you had any weight changes? : No Change Sleep: No Change Total Hours of Sleep: 8 Vegetative Symptoms: None  ADLScreening [facility] Assessment Services) Patient's cognitive ability adequate to safely complete daily activities?: Yes Patient able to express need for assistance with ADLs?: Yes Independently performs ADLs?: Yes (appropriate for developmental age)  Prior Inpatient Therapy Prior Inpatient Therapy: No  Prior Outpatient Therapy Prior Outpatient Therapy: Yes Prior Therapy Dates: Current Prior Therapy Facilty/Provider(s): [facility] Reason for Treatment: Mood Disorder Does patient have an ACCT team?: No Does patient have Intensive In-House Services?  : No Does patient have [facility] services? : No Does patient have P4CC services?: No  ADL Screening (condition at time of admission) Patient's cognitive ability adequate to safely complete daily activities?: Yes Is the patient deaf or have difficulty hearing?: No Does the patient have difficulty seeing, even when wearing glasses/contacts?: No Does the patient have difficulty concentrating, remembering, or making decisions?: No Patient able to express need for assistance with ADLs?: Yes Does the patient have difficulty dressing or bathing?: No Independently performs ADLs?: Yes (appropriate for developmental age) Does the patient have difficulty walking or climbing stairs?: No Weakness of Legs: None Weakness of Arms/Hands: None  Home Assistive Devices/Equipment Home  Assistive Devices/Equipment: None  Therapy Consults (therapy consults require a physician order) PT Evaluation Needed: No OT Evalulation Needed: No SLP Evaluation Needed: No Abuse/Neglect Assessment (Assessment to be complete while patient is alone) Abuse/Neglect Assessment Can Be Completed: Yes Physical Abuse: Denies Verbal Abuse: Denies Sexual Abuse: Denies Exploitation of patient/patient's resources: Denies Self-Neglect: Denies Values / Beliefs Cultural Requests During Hospitalization: None Spiritual Requests During Hospitalization: None Consults Spiritual Care Consult Needed: No Transition of Care Team Consult Needed: No Advance Directives (For Healthcare) Does Patient Have a Medical Advance Directive?: No  Disposition:  Disposition Initial Assessment Completed for this Encounter: Yes  On Site Evaluation by:   Reviewed with Physician:    Lilyan Gilford MS, LCAS, [facility], [facility] Therapeutic Triage Specialist 11/20/2019 11:03 AM

## 2019-11-20 NOTE — ED Notes (Signed)
Patient out of bed to bathroom, ate 1/2 his lunch went back to bed.

## 2019-11-20 NOTE — ED Provider Notes (Signed)
[facility] Emergency Department Provider Note  ____________________________________________  Time seen: Approximately 12:16 AM  I have reviewed the triage vital signs and the nursing notes.   HISTORY  Chief Complaint Psychiatric Evaluation   HPI Paul Vaughan is a 64 y.o. male with a history of borderline intellectual disability and adjustment disorder who was brought in from his group home for psychiatric evaluation.  Patient arrives under IVC by [facility].  According to the IVC papers, patient has not been taking his medications or eaten over the last several days.  Yesterday he left the group home and [place].  [place] and had defecated on himself.  When brought back to the group home, patient attempted to leave again.  At this time patient is very upset that he is here and wishes to go home.  He denies suicidal homicidal ideations.  Denies any hallucinations or medical complaints.  He denies any drug or alcohol use.   History reviewed. No pertinent past medical history.  Patient Active Problem List   Diagnosis Date Noted  . 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 No pertinent surgical history.  Prior to Admission medications   Medication Sig Start Date End Date Taking? Authorizing Provider  hydrOXYzine (VISTARIL) 50 MG capsule Take 1 capsule (50 mg total) by mouth 3 (three) times daily as needed for anxiety. 04/10/17   Clapacs, Jackquline Denmark, MD  QUEtiapine (SEROQUEL) 50 MG tablet TAKE ONE TABLET BY MOUTH THREE TIMES A DAY 04/24/19   Clapacs, Jackquline Denmark, MD    Allergies Penicillins and Sulfa antibiotics  No family history on file.  Social History Social History   Tobacco Use  . Smoking status: Current Every Day Smoker    Packs/day: 1.00    Types: Cigarettes  . Smokeless tobacco:  Never Used  Substance Use Topics  . Alcohol use: No  . Drug use: No    Review of Systems  Constitutional: Negative for fever. Eyes: Negative for visual changes. ENT: Negative for sore throat. Neck: No neck pain  Cardiovascular: Negative for chest pain. Respiratory: Negative for shortness of breath. Gastrointestinal: Negative for abdominal pain, vomiting or diarrhea. Genitourinary: Negative for dysuria. Musculoskeletal: Negative for back pain. Skin: Negative for rash. Neurological: Negative for headaches, weakness or numbness. Psych: No SI or HI. + abnormal behavior  ____________________________________________   PHYSICAL EXAM:  VITAL SIGNS: ED Triage Vitals  Enc Vitals Group     BP 11/19/19 2348 (!) 125/58     Pulse Rate 11/19/19 2348 69     Resp 11/19/19 2348 18     Temp 11/19/19 2348 (!) 97.5 F (36.4 C)     Temp Source 11/19/19 2348 Oral     SpO2 11/19/19 2348 100 %     Weight 11/19/19 2349 150 lb (68 kg)     Height 11/19/19 2349 6' (1.829 m)     Head Circumference --      Peak Flow --      Pain Score 11/19/19 2349 0     Pain Loc --      Pain Edu? --      Excl. in GC? --     Constitutional: Alert and oriented. Well appearing and in no apparent distress. HEENT:      Head: Normocephalic and atraumatic.         Eyes:  Conjunctivae are normal. Sclera is non-icteric.       Mouth/Throat: Mucous membranes are moist.       Neck: Supple with no signs of meningismus. Cardiovascular: Regular rate and rhythm.  Respiratory: Normal respiratory effort.  Gastrointestinal: Soft, non tender, and non distended. Musculoskeletal: No edema, cyanosis, or erythema of extremities. Neurologic: Normal speech and language. Face is symmetric. Moving all extremities. No gross focal neurologic deficits are appreciated. Skin: Skin is warm, dry and intact. No rash noted. Psychiatric: Mood and affect are normal. Speech and behavior are  normal.  ____________________________________________   LABS (all labs ordered are listed, but only abnormal results are displayed)  Labs Reviewed  COMPREHENSIVE METABOLIC PANEL - Abnormal; Notable for the following components:      Result Value   Sodium 134 (*)    Glucose, Bld 109 (*)    BUN 74 (*)    Creatinine, Ser 2.48 (*)    Total Protein 8.6 (*)    GFR calc non Af Amer 26 (*)    GFR calc Af Amer 31 (*)    All other components within normal limits  SALICYLATE LEVEL - Abnormal; Notable for the following components:   Salicylate Lvl <7.0 (*)    All other components within normal limits  ACETAMINOPHEN LEVEL - Abnormal; Notable for the following components:   Acetaminophen (Tylenol), Serum <10 (*)    All other components within normal limits  CBC - Abnormal; Notable for the following components:   WBC 16.7 (*)    RBC 3.84 (*)    Hemoglobin 11.9 (*)    HCT 34.8 (*)    All other components within normal limits  ETHANOL  URINE DRUG SCREEN, QUALITATIVE ([facility] ONLY)   ____________________________________________  EKG  none  ____________________________________________  RADIOLOGY  none  ____________________________________________   PROCEDURES  Procedure(s) performed: None Procedures Critical Care performed:  None ____________________________________________   INITIAL IMPRESSION / ASSESSMENT AND PLAN / ED COURSE   64 y.o. male with a history of borderline intellectual disability and adjustment disorder who was brought in from his group home for psychiatric evaluation after several days of not taking his medications or eating, leaving the group home, and behaving erratically.  Patient is cooperative but upset to be here today.  He is very hesitant to provide any history.  He denies any medical complaints.  We will keep patient under IVC at this time and consult psychiatry.  Old medical records reviewed.  IVC papers reviewed.  Labs for medical clearance pending.     _________________________ 4:12 AM on 11/20/2019 -----------------------------------------  Labs for medical clearance showing creatinine of 2.48 which is improved since last visit from 4.05.  No significant electrolyte derangements.  BUN is elevated however that seems to be patient's baseline as well.  Negative alcohol level.  Patient has a white count of 16.7.  Denies any signs of infection including fever, dysuria, cough, shortness of breath.  UA and drug screen are pending.  Will consult psychiatry.  _________________________ 6:51 AM on 11/20/2019 -----------------------------------------  Patient continues to refuse to give a urine sample.  We will continue to try.  At this time is clear for psychiatric evaluation.  The patient has been placed in psychiatric observation due to the need to provide a safe environment for the patient while obtaining psychiatric consultation and evaluation, as well as ongoing medical and medication management to treat the patient's condition.  The patient has been placed under full IVC at this time.    Please note:  Patient was evaluated in Emergency Department today for the symptoms described in the history of present illness. Patient was evaluated in the context of the global COVID-19 pandemic, which necessitated consideration that the patient might be at risk for infection with the SARS-CoV-2 virus that causes COVID-19. Institutional protocols and algorithms that pertain to the evaluation of patients at risk for COVID-19 are in a state of rapid change based on information released by regulatory bodies including the CDC and federal and state organizations. These policies and algorithms were followed during the patient's care in the ED.  Some ED evaluations and interventions may be delayed as a result of limited staffing during the pandemic.   ____________________________________________   FINAL CLINICAL IMPRESSION(S) / ED DIAGNOSES   Final diagnoses:   Agitation  Abnormal behavior      NEW MEDICATIONS STARTED DURING THIS VISIT:  ED Discharge Orders    None       Note:  This document was prepared using Dragon voice recognition software and may include unintentional dictation errors.    Don Perking, [place], MD 11/20/19 [phone]

## 2019-11-20 NOTE — ED Notes (Signed)
IVC PENDING  CONSULT ?

## 2019-11-21 NOTE — ED Notes (Signed)
Hourly rounding reveals patient sleeping in room. No complaints, stable, in no acute distress. Q15 minute rounds and monitoring via Security Cameras to continue. 

## 2019-11-21 NOTE — ED Notes (Signed)
Hourly rounding reveals patient awake in room. No complaints, stable, in no acute distress. Q15 minute rounds and monitoring via Security Cameras to continue. 

## 2019-11-21 NOTE — BH Assessment (Addendum)
Referral check for Psychiatric Hospitalization faxed to;   [facility] ([phone]- [phone]),  -8:22 No Answer   [facility] ([phone]), Rep requested refax, task completed 9:36am.   [facility] [phone], [phone], [phone] or [phone]),  -8:32 am Spoke with Inetta Fermo and left a call back number.    [place] [phone]), Denied due to IDD   [facility] [phone] -or- [phone]),Denied due to IDD   [facility] [phone]) 8:49 Facility does not have male beds at this time per Shay.

## 2019-11-21 NOTE — ED Notes (Signed)
Pt asking to see the doctor about "getting out of here."

## 2019-11-21 NOTE — BH Assessment (Addendum)
Patient to be admitted based o current presentation and collateral information even though patient did not want to participate the assessment and remained mute and would not come from under the blankets.

## 2019-11-21 NOTE — ED Notes (Signed)
Report to include Situation, Background, Assessment, and Recommendations received from [name] RN. Patient alert and oriented, warm and dry, in no acute distress. Patient denies SI, HI, AVH and pain. Patient made aware of Q15 minute rounds and security cameras for their safety. Patient instructed to come to me with needs or concerns. 

## 2019-11-21 NOTE — ED Provider Notes (Signed)
Emergency Medicine Observation Re-evaluation Note  Jemell Town is a 64 y.o. male, seen on rounds today.    Physical Exam  BP (!) 82/47 (BP Location: Right Arm)   Pulse 83   Temp 97.9 F (36.6 C) (Oral)   Resp 18   Ht 6' (1.829 m)   Wt 68 kg   SpO2 97%   BMI 20.34 kg/m  Physical Exam Patient sleeping comfortably. Constitutional: Comfortable in no distress Respiratory: Breathing normally no retractions ED Course / MDM  EKG:    I have reviewed the labs performed to date as well as medications administered while in observation.  No reports of any agitation or combativeness Plan  Current plan is for disposition as per psych and social work.  Patient did not want to go back to his group home.  Patient's CBC 2 days ago showed an elevated white count but he remains comfortable with no fever.  Today his blood pressure was low while sleeping.  Wife to make sure that it comes up when he is awake.   Arnaldo Natal, MD 11/21/19 [phone]

## 2019-11-21 NOTE — BH Assessment (Addendum)
Referral information for Psychiatric Hospitalization faxed to;    [facility] ([phone]- [phone]),    [facility] ([phone]), No current beds available   [place] [phone], [phone], [phone] or [phone]),    [facility] [phone]), Denied due to IDD   [facility] [phone] -or- [phone]), Denied due to IDD   [facility] [phone])

## 2019-11-22 NOTE — ED Notes (Signed)
Hourly rounding reveals patient sleeping in room. No complaints, stable, in no acute distress. Q15 minute rounds and monitoring via Security Cameras to continue. 

## 2019-11-22 NOTE — ED Notes (Signed)
Pt up to bathroom.

## 2019-11-22 NOTE — ED Notes (Signed)
Pt given snack at 2030

## 2019-11-22 NOTE — ED Provider Notes (Signed)
Emergency Medicine Observation Re-evaluation Note  Paul Vaughan is a 64 y.o. male, seen on rounds today.   Physical Exam  BP 112/63 (BP Location: Right Arm)   Pulse 61   Temp 98 F (36.7 C) (Oral)   Resp 18   Ht 6' (1.829 m)   Wt 68 kg   SpO2 100%   BMI 20.34 kg/m  Physical Exam  Constitutional patient resting in bed sleeping.  In no distress. Respiratory: Patient breathing easily no retractions no respiratory distress  ED Course / MDM  EKG:    I have reviewed the labs performed to date as well as medications administered while in observation.  Plan  Placement.  Psych continues to investigate several psychiatric hospitals so far they have been unsuccessful.   Arnaldo Natal, MD 11/22/19 [phone]

## 2019-11-22 NOTE — ED Notes (Signed)
Hourly rounding reveals patient in rest room. No complaints, stable, in no acute distress. Q15 minute rounds and monitoring via Security Cameras to continue. 

## 2019-11-22 NOTE — Final Progress Note (Signed)
Physician Final Progress Note  Patient ID: Paul Vaughan MRN: [id number] DOB/AGE: June 15, 1955 64 y.o.  Admit date: 11/19/2019 Admitting provider: No admitting provider for patient encounter. Discharge date: 11/22/2019   Admission Diagnoses:   Adjustment Disorder Past history of Major depression Group home discord   Discharge Diagnoses:  Active Problems:   * No active hospital problems. *   Same   Consults:   SW [facility] PSYCH AND ER MD   Significant Findings/ Diagnostic Studies: { none )   Procedures: --None   Discharge Condition: fair  isposition:    Transfer to new Group home   psychiatrically cleared ask SW to help with this matter   Diet: Regular diet  Discharge Activity: Activity as tolerated   MS   Alert cooperative oriented times four Slightly dysphoric but okay  No active SI HI or plans No other psychosis or mania Somewhat anxious but okay   Not clouded or fluctuant COncentratoin and attention okay      One hour   Signed: Roselind Messier 11/22/2019, 2:21 PM

## 2019-11-22 NOTE — BH Assessment (Addendum)
Referral Check:    [facility] ([phone]- [phone]), Kenard Gower reports unit at capacity check back tomorrow    [facility] [phone]), No answer   [facility] [phone], [phone], [phone] or [phone]), Left on hold    [facility] [phone]), Denied due to IDD   [facility] [phone] -or- [phone]),Denied due to IDD   [facility] [phone]), Received automatic message ~will return call once a decision is made

## 2019-11-23 MED ORDER — QUETIAPINE FUMARATE 25 MG PO TABS
50.0000 mg | ORAL_TABLET | Freq: Every day | ORAL | Status: DC
  Administered 2019-11-23 – 2019-12-13 (×21): 50 mg via ORAL
  Filled 2019-11-23 (×21): qty 2

## 2019-11-23 MED ORDER — SERTRALINE HCL 25 MG PO TABS
25.0000 mg | ORAL_TABLET | Freq: Every day | ORAL | Status: DC
  Administered 2019-11-23 – 2019-11-27 (×5): 25 mg via ORAL
  Filled 2019-11-23 (×7): qty 1

## 2019-11-23 NOTE — ED Notes (Signed)
Psych MD in to talk to pt

## 2019-11-23 NOTE — ED Notes (Signed)
Attempted to talk to pt, he refuses to answer assessment questions. Does not appear to be responding to internal stimuli.

## 2019-11-23 NOTE — ED Notes (Signed)
VOL  PENDING  PLACEMENT 

## 2019-11-23 NOTE — ED Notes (Signed)
Meal tray given 

## 2019-11-23 NOTE — ED Notes (Signed)
Pt received snack tray and vitals obtained.   lw edt  

## 2019-11-23 NOTE — Progress Notes (Addendum)
Patient ID: Paul Vaughan, male   DOB: 10/25/1955, 64 y.o.   MRN: [id number]    Psych brief follow up note  Saw patient today and readjusted his meds  Zoloft 25 qam Seroquel 50 at night  He is getting frustrated at finding no bed yet, which is pending.   IVC rescinded yesterday  Due to Psych stability and clearance  No other symptoms except anxiety, irritability and depression   No other side effects or medical for now   Tried to give assurance   Pending transfer to shelter [address] or another group home    Alert somewhat cooperative  Oriented to person place date and time Not clouded or fluctuant Mood depressed affect down Anxious  No increased SI HI or plans but still remains at risk needing structure med mgt and overall support No increase in psychosis or mania    No other new labs for now  Rama Candise Bowens MD     Psych Cleared for disposition

## 2019-11-23 NOTE — ED Notes (Signed)
Provided pt w snack

## 2019-11-23 NOTE — ED Provider Notes (Signed)
Emergency Medicine Observation Re-evaluation Note  Paul Vaughan is a 64 y.o. male, seen on rounds today.  Pt initially presented to the ED for complaints of Psychiatric Evaluation Currently, the patient is calm, resting.  Physical Exam  BP (!) 109/56 (BP Location: Right Arm)   Pulse 70   Temp 98.2 F (36.8 C) (Oral)   Resp 18   Ht 6' (1.829 m)   Wt 68 kg   SpO2 100%   BMI 20.34 kg/m  Physical Exam   Gen: Calm, in NAD, resting in psych scrubs HEENT: NCAT CV: Well perfused Resp: Normal work of breathing, no apparent resp distress Neuro: Non-focal, no apparent deficits, at mental baseline Psych: Calm, resting  ED Course / MDM  EKG:    I have reviewed the labs performed to date as well as medications administered while in observation.  Recent changes in the last 24 hours include none. Plan  Current plan is for psych disposition. Patient is under full IVC at this time.   Shaune Pollack, MD 11/23/19 [phone]

## 2019-11-24 MED ORDER — SERTRALINE HCL 25 MG PO TABS
25.0000 mg | ORAL_TABLET | Freq: Every day | ORAL | 2 refills | Status: DC
Start: 2019-11-24 — End: 2021-06-30

## 2019-11-24 MED ORDER — QUETIAPINE FUMARATE 25 MG PO TABS
50.0000 mg | ORAL_TABLET | Freq: Every day | ORAL | 0 refills | Status: DC
Start: 2019-11-24 — End: 2021-07-06

## 2019-11-24 NOTE — ED Provider Notes (Signed)
Notified by the nurse staff the patient is stating that he no longer wants to stay is requesting discharge.  Reviewed the notes it does appear that he was cleared by psychiatry but was encouraged to stay overnight pending placement.  Does not meet any criteria for IVC.  Have ordered his prescribed medications per psych recommendation.  Given resources for shelter and placement   Willy Eddy, MD 11/24/19 1155

## 2019-11-24 NOTE — Final Progress Note (Signed)
Physician Final Progress Note  Patient ID: Paul Vaughan MRN: [id number] DOB/AGE: 11-15-1955 64 y.o.  Admit date: 11/19/2019 Admitting provider: No admitting provider for patient encounter. Discharge date: 11/24/2019   Admission Diagnoses:  History of dementia Schizoaffective disorder    Discharge Diagnoses:  Active Problems:   * No active hospital problems. *  same   Consults:  [facility]S PSYCH MD  ER MD   Significant Findings/ Diagnostic Studies: { none new   Procedures:  None  Discharge Condition: fair   He is as stable as possible does not meet criteria either for gero psych bed   Fair  Disposition: he now seeks shelter living   Diet:  As tolerated   Discharge Activity:  As tolerated   Alert cooperative  Snippy and edgy --and frustrated But wants to go --he is not interested in new meds right now   Awaits guardian notification before discharge  Oriented at least to name part of date and place   Ambulatory   No other new psychosis or mania per se  Voluntary status,  Does not meet IVC criteria   To shelters that were given by ITT      Follow-up Information    Schedule an appointment as soon as possible for a visit  with [facility]   Contact information: [address] [place] [address] [phone]               Total time spent taking care of this patient: minutes  20-25  Signed: Roselind Messier 11/24/2019, 3:20 PM

## 2019-11-24 NOTE — ED Notes (Signed)
VOL/  PENDING  DISCHARGE

## 2019-11-24 NOTE — ED Notes (Signed)
Attempted to make contact with guardian about discharge and left VM with call back number

## 2019-11-24 NOTE — ED Notes (Signed)
Pt insisting he wants to be discharged. He is voluntary, was explained to him that Clinical research associate will let the doctor know

## 2019-11-24 NOTE — ED Notes (Signed)
Attempted to contact pt's guardian again with no success, VM left to call back

## 2019-11-24 NOTE — BH Assessment (Addendum)
TTS was contacted by pt current RN reporting pt to be requesting to leave. TTS consulted with ED MD, Roxan Hockey regarding pt's current voluntary status and wishes to be discharged. Roxan Hockey, MD agreed to discharge pt with resources.  Pt expressed to no longer need assistance in locating placement. TTS provided pt shelter resources and expressed the need to make contact with guardian before discharge.   TTS left a HIPPA compliant voicemail for Jenel Lucks [employer], [phone])  Pt discharge is pending contact with guardian.

## 2019-11-25 NOTE — NC FL2 (Signed)
  [place] MEDICAID FL2 LEVEL OF CARE SCREENING TOOL     IDENTIFICATION  Patient Name: Paul Vaughan Birthdate: 1955-12-31 Sex: male Admission Date (Current Location): 11/19/2019  [place] and [place] Number:  [address] [id number] Facility and Address:  [facility], [address] [place], [place] [address]      Provider Number: [id number]  Attending Physician Name and Address:  No att. providers found  Relative Name and Phone Number:  Forde Radon [facility] DSS [phone] - legal guardian    Current Level of Care: Hospital Recommended Level of Care: Family Care Home, Other (Comment) (Group Home) Prior Approval Number:    Date Approved/Denied:   PASRR Number:    Discharge Plan: Other (Comment) (Group Home/Family Care Home)    Current Diagnoses: Patient Active Problem List   Diagnosis Date Noted  . 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    Orientation RESPIRATION BLADDER Height & Weight     Self, Situation, Place  Normal Continent Weight: 150 lb (68 kg) Height:  6' (182.9 cm)  BEHAVIORAL SYMPTOMS/MOOD NEUROLOGICAL BOWEL NUTRITION STATUS  Other (Comment), [place]   Continent Diet  AMBULATORY STATUS COMMUNICATION OF NEEDS Skin   Independent Verbally Normal                       Personal Care Assistance Level of Assistance              Functional Limitations Info             SPECIAL CARE FACTORS FREQUENCY                       Contractures Contractures Info: Not present    Additional Factors Info                  Current Medications (11/25/2019):  This is the current hospital active medication list Current Facility-Administered Medications  Medication Dose Route Frequency Provider Last Rate Last Admin  . QUEtiapine (SEROQUEL) tablet 50 mg  50 mg Oral QHS Roselind Messier, MD   50 mg at 11/24/19 2157  .  sertraline (ZOLOFT) tablet 25 mg  25 mg Oral Daily Roselind Messier, MD   25 mg at 11/25/19 0932   Current Outpatient Medications  Medication Sig Dispense Refill  . hydrOXYzine (VISTARIL) 50 MG capsule Take 1 capsule (50 mg total) by mouth 3 (three) times daily as needed for anxiety. 60 capsule 0  . QUEtiapine (SEROQUEL) 25 MG tablet Take 2 tablets (50 mg total) by mouth at bedtime. 30 tablet 0  . sertraline (ZOLOFT) 25 MG tablet Take 1 tablet (25 mg total) by mouth daily. 30 tablet 2     Discharge Medications: Please see discharge summary for a list of discharge medications.  Relevant Imaging Results:  Relevant Lab Results:   Additional Information SS# [id number]  Joseph Art, LCSWA

## 2019-11-25 NOTE — ED Notes (Signed)
Report received from [facility]. Patient care assumed. Patient/RN introduction complete. Will continue to monitor. Pt walking around room at this time calm and cooperative. Pt denies any pain or discomfort. No SI or HI, pt states "I am ready to go home". Explained to patient discharge pending placement and pt understands disposition plan.

## 2019-11-25 NOTE — ED Notes (Signed)
Patient observed with no unusual behavior or acute distress. Patient with no verbalized needs or c/o at this time.... will continue to monitor and follow up as needed. Security staff monitoring patient on camera system.  

## 2019-11-25 NOTE — ED Notes (Signed)
Patient offered shower afer breakfast this morning. Patient refused and stated maybe later today.  °

## 2019-11-25 NOTE — ED Notes (Addendum)
Patient observed with no unusual behavior or acute distress. Patient with no verbalized needs or c/o at this time.... will continue to monitor and follow up as needed. Security staff monitoring patient on camera system. Q15 min rounds done by EDT.   

## 2019-11-25 NOTE — ED Notes (Signed)
Pt given meal tray and a cup of sprite.  

## 2019-11-25 NOTE — TOC Initial Note (Signed)
Transition of Care [facility]) - Initial/Assessment Note    Patient Details  Name: Lamond Glantz MRN: [id number] Date of Birth: Oct 01, 1955  Transition of Care [facility]) CM/SW Contact:    Joseph Art, LCSWA Phone Number: 11/25/2019, 12:28 PM  Clinical Narrative:                  [facility] consult was received on 7/14, patient was psychiatrically cleared on 11/22/2019.  Patient was living are [facility], he was given 30 day notice for discharge from group home, which was effective on 11/24/2019. This CSW spoke with group home rep, she stated the patient has not been aggressive or threatened anyone but does wander and is non-compliant with medications or doctor's appointments.  This CSW called and left voicemail for patient's legal guardian, Forde Radon [phone].   Expected Discharge Plan: Group Home Barriers to Discharge: ED Patient Insisting on an Alternate Living Situation/Facility, ED Facility/Family Refusing to Allow Patient to Return, ED Unable to Make Contact with Facility/Family (Unable to make contact w/ legal guardian Forde Radon [phone])   Patient Goals and CMS Choice Patient states their goals for this hospitalization and ongoing recovery are:: Patient does not want to go back to group home.      Expected Discharge Plan and Services Expected Discharge Plan: Group Home In-house Referral: Clinical Social Work     Living arrangements for the past 2 months: Group Home                                      Prior Living Arrangements/Services Living arrangements for the past 2 months: Group Home Lives with:: Facility Resident Patient language and need for interpreter reviewed:: Yes Do you feel safe going back to the place where you live?: Yes      Need for Family Participation in Patient Care: Yes (Comment) Care giver support system in place?: Yes (comment)   Criminal Activity/Legal Involvement Pertinent to Current Situation/Hospitalization: No - Comment  as needed  Activities of Daily Living Home Assistive Devices/Equipment: None ADL Screening (condition at time of admission) Patient's cognitive ability adequate to safely complete daily activities?: Yes Is the patient deaf or have difficulty hearing?: No Does the patient have difficulty seeing, even when wearing glasses/contacts?: No Does the patient have difficulty concentrating, remembering, or making decisions?: No Patient able to express need for assistance with ADLs?: Yes Does the patient have difficulty dressing or bathing?: No Independently performs ADLs?: Yes (appropriate for developmental age) Does the patient have difficulty walking or climbing stairs?: No Weakness of Legs: None Weakness of Arms/Hands: None  Permission Sought/Granted Permission sought to share information with : Facility Medical sales representative, Guardian    Share Information with NAME: [name]  Permission granted to share info w AGENCY: [facility]     Permission granted to share info w Contact Information: [phone]  Emotional Assessment Appearance:: Appears stated age Attitude/Demeanor/Rapport: Unable to Assess Affect (typically observed): Unable to Assess Orientation: : Oriented to Self, Oriented to Place, Oriented to Situation Alcohol / Substance Use: Not Applicable Psych Involvement: Yes (comment) ([facility] psychiatrically cleared - 11/22/2019 - Dr. Smith Robert)  Admission diagnosis:  IVC Patient Active Problem List   Diagnosis Date Noted  . 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 [place] [address] Phone: [phone] Fax:  [phone]     Social Determinants of Health (SDOH) Interventions    Readmission Risk Interventions No flowsheet data found.

## 2019-11-25 NOTE — ED Notes (Signed)
Pt given snack tray at this time.  

## 2019-11-25 NOTE — ED Notes (Addendum)
Care Home" [facility] ([phone]) called as per Irving Burton patient will not be returning to [facility], reports his Guardian was made aware that patient has exceeded his 30 notice and will not be returning. Also stated requested tha his medications and belonging be picked up at [facility] which are still there today. Social worker contacted to see where patient has been discharged to. Patient was discharge 7/13/2. Multipl;e attempts made today to contact legal guardian, legal guardian Lazaro Arms [phone]  With no success.

## 2019-11-25 NOTE — ED Notes (Signed)
uable to get in touch with legal guardian after mutiple attempts today. Social worker called and made aware of this situation. Patient was discharge yesterday, nurse was also not able to reach guardian for safe discharge. As per Child psychotherapist  If we have not been able to get in touch with the patient's legal guardian, then there is nothing they can do. The patient wil have to stay here, even if he's voluntary, until the legal guardian is notified and states they will pick up the patient or the patient has somewhere to go. She will  try getting in touch with her and keep [place] posted.

## 2019-11-26 NOTE — ED Notes (Signed)
Hourly rounding reveals patient in room. No complaints, stable, in no acute distress. Q15 minute rounds and monitoring via Security Cameras to continue. 

## 2019-11-26 NOTE — ED Notes (Signed)
Patient resting comfortably in room. No complaints or concerns voiced. No distress or abnormal behavior noted. Will continue to monitor with security cameras. Q 15 minute rounds continue. 

## 2019-11-26 NOTE — ED Notes (Signed)
Pt received snack and is resting in bed at this time with no complaints  

## 2019-11-26 NOTE — TOC Progression Note (Signed)
Transition of Care [facility]) - Progression Note    Patient Details  Name: Paul Vaughan MRN: [id number] Date of Birth: 1955/10/13  [facility] CM/SW Contact  Marina Goodell Phone Number: [phone] 11/26/2019, 9:23 AM  Clinical Narrative:     CSW contacted Duaine Dredge [place] [facility] supervisor for update on patient disposition.  Ms. Marlyne Beards stated they continue to look for placement and she would inform this CSW as soon as she had a placement offer.  Expected Discharge Plan: Group Home Barriers to Discharge: ED Patient Insisting on an Alternate Living Situation/Facility, ED Facility/Family Refusing to Allow Patient to Return, ED Unable to Make Contact with Facility/Family (Unable to make contact w/ legal guardian [name] [phone])  Expected Discharge Plan and Services Expected Discharge Plan: Group Home In-house Referral: Clinical Social Work     Living arrangements for the past 2 months: Group Home                                       Social Determinants of Health (SDOH) Interventions    Readmission Risk Interventions No flowsheet data found.

## 2019-11-26 NOTE — ED Provider Notes (Signed)
Emergency Medicine Observation Re-evaluation Note  Paul Vaughan is a 64 y.o. male, seen on rounds today.  Pt initially presented to the ED for complaints of Psychiatric Evaluation Currently, the patient is awaiting placement.  Physical Exam  BP 135/76 (BP Location: Left Arm)   Pulse 66   Temp 98.2 F (36.8 C) (Oral)   Resp 18   Ht 6' (1.829 m)   Wt 68 kg   SpO2 99%   BMI 20.34 kg/m  Physical Exam  Gen: Calm, in NAD, resting in psych scrubs HEENT: NCAT CV: Well perfused Resp: Normal work of breathing, no apparent resp distress Neuro: Non-focal, no apparent deficits, at mental baseline  ED Course / MDM  EKG:    I have reviewed the labs performed to date as well as medications administered while in observation.  Recent changes in the last 24 hours include social continuing to work on placement. Plan  Current plan is for continued work with SW/CM.  He is cleared from psych and medical standpoint. Patient is not under full IVC at this time.   Willy Eddy, MD 11/26/19 [phone]

## 2019-11-26 NOTE — ED Notes (Signed)
Patient to nursing station multiple times asking when is he leaving, requesting to speak to sw. Patient advised that he is awaiting placement and that sw is working on placing him to a new group home.Patient not very happy with this new news.

## 2019-11-26 NOTE — ED Notes (Signed)
VOL  PENDING  PLACEMENT 

## 2019-11-26 NOTE — ED Notes (Signed)
Pt up to bathroom, no complaints.

## 2019-11-26 NOTE — ED Notes (Signed)
Report to include Situation, Background, Assessment, and Recommendations received from RN. Patient alert and oriented, warm and dry, in no acute distress. Patient denies SI, HI, AVH and pain. Patient made aware of Q15 minute rounds and Rover and Officer presence for their safety. Patient instructed to come to me with needs or concerns.   

## 2019-11-27 NOTE — ED Notes (Signed)
Report to include Situation, Background, Assessment, and Recommendations received from [name] RN. Patient alert and oriented, warm and dry, in no acute distress. Patient denies SI, HI, AH and pain but states he sees"pictures, like pencil sketches". Patient made aware of Q15 minute rounds and security cameras for their safety. Patient instructed to come to me with needs or concerns.

## 2019-11-27 NOTE — ED Notes (Signed)
Hourly rounding reveals patient awake in room. No complaints, stable, in no acute distress. Q15 minute rounds and monitoring via Security Cameras to continue. 

## 2019-11-27 NOTE — ED Notes (Signed)
Hourly rounding reveals patient in room. No complaints, stable, in no acute distress. Q15 minute rounds and monitoring via Security Cameras to continue. 

## 2019-11-27 NOTE — ED Notes (Signed)
Pt to nurse's station asking for new pants.

## 2019-11-27 NOTE — ED Notes (Signed)
Pt. Was given dinner tray and a drink. 

## 2019-11-28 DIAGNOSIS — R4183 Borderline intellectual functioning: Secondary | ICD-10-CM | POA: Diagnosis not present

## 2019-11-28 DIAGNOSIS — F4325 Adjustment disorder with mixed disturbance of emotions and conduct: Secondary | ICD-10-CM

## 2019-11-28 MED ORDER — SERTRALINE HCL 50 MG PO TABS
50.0000 mg | ORAL_TABLET | Freq: Every day | ORAL | Status: DC
  Administered 2019-11-29 – 2019-12-14 (×16): 50 mg via ORAL
  Filled 2019-11-28 (×17): qty 1

## 2019-11-28 NOTE — ED Notes (Signed)
Hourly rounding reveals patient in rest room. No complaints, stable, in no acute distress. Q15 minute rounds and monitoring via Security Cameras to continue. 

## 2019-11-28 NOTE — ED Provider Notes (Signed)
Emergency Medicine Observation Re-evaluation Note  Paul Vaughan is a 64 y.o. male, seen on rounds today.  Pt initially presented to the ED for complaints of Psychiatric Evaluation No acute events overnight.  Physical Exam  BP 127/67 (BP Location: Left Arm)   Pulse 71   Temp 97.6 F (36.4 C) (Oral)   Resp 16   Ht 6' (1.829 m)   Wt 68 kg   SpO2 98%   BMI 20.34 kg/m   ED Course / MDM  I have reviewed the labs performed to date as well as medications administered while in observation.  No new labs over the past 24 hours.  Plan  Current plan is for awaiting placement into a group home. Patient is not under full IVC at this time.   Minna Antis, MD 11/28/19 1441

## 2019-11-28 NOTE — ED Notes (Signed)
Report received, care assumed, pt resting quietly in bed at present.  Will monitor.

## 2019-11-28 NOTE — Progress Notes (Signed)
[facility] MD Progress Note  11/28/2019 1:39 PM Paul Vaughan  MRN:  [id number] Subjective: "I was not smoking in the group home." Principal Problem: Adjustment disorder with mixed disturbance of emotions and conduct Diagnosis: Principal Problem:   Adjustment disorder with mixed disturbance of emotions and conduct Active Problems:   Borderline intellectual disability  Total Time spent with patient: 40 minutes  Past Psychiatric History: Schizoaffective disorder, dementia  Per intake HPI 11/20/2019 from TTS: Paul Vaughan is an 64 y.o. male who presents to the ER via law enforcement, after his Care Home voiced concerns about the recent changes in his behaviors and the state of his mental health. Per the patient, he's unsure why he was brought to the ER. Throughout the interview, he denied SI/HI and AV/H. He denies any changes in his behaviors and reports of having no problems at the care home. Per the "Care Home" [facility] ([phone]) he started to wander from the Group Home and the times he stayed away was increasing. At times he would stare and be confused about what was going on and who the staff members were. For the last several weeks, he has gone to different neighbors' homes, knocking on their windows and staring through the windows at them. Law enforcement has been called several times. Last night, he was at train deport standing still and staring. Law enforcement brought him back to the living facility. While at the bus depot or in route home, he defecated on his self. Due patient presentation and affect, law enforcement brought him to the ER for an evaluation. Care Home states, they have notice him having behavior changes approximately every six months. The patient has been in the same home for two years. They also suspect the patient has been taking his medications because they found pills in his room.  11/28/2019:  On evaluation today, patient expresses frustration that he cannot go  back to his previous group home.  He does admit that he did not get along with people there, however he does feel frustrated that he does not have a place to live.  He understands that is the weekend, and he is oriented to person, date, time, and situation and is hopeful for discharge within the next week.  He is denying any suicidal or homicidal ideation, plan, or intent.  He denies any auditory visual hallucination and does not appear to be responding to internal stimuli.  He denies any cravings for tobacco.   Past Medical History: History reviewed. No pertinent past medical history. History reviewed. No pertinent surgical history. Family History: No family history on file. Family Psychiatric  History: none  Social History:  Social History   Substance and Sexual Activity  Alcohol Use No     Social History   Substance and Sexual Activity  Drug Use No    Social History   Socioeconomic History  . Marital status: Single    Spouse name: Not on file  . Number of children: Not on file  . Years of education: Not on file  . Highest education level: Not on file  Occupational History  . Not on file  Tobacco Use  . Smoking status: Current Every Day Smoker    Packs/day: 1.00    Types: Cigarettes  . Smokeless tobacco: Never Used  Substance and Sexual Activity  . Alcohol use: No  . Drug use: No  . Sexual activity: Not on file  Other Topics Concern  . Not on file  Social History Narrative  .  Not on file   Social Determinants of Health   Financial Resource Strain:   . Difficulty of Paying Living Expenses:   Food Insecurity:   . Worried About Programme researcher, broadcasting/film/video in the Last Year:   . Barista in the Last Year:   Transportation Needs:   . Freight forwarder (Medical):   Marland Kitchen Lack of Transportation (Non-Medical):   Physical Activity:   . Days of Exercise per Week:   . Minutes of Exercise per Session:   Stress:   . Feeling of Stress :   Social Connections:   . Frequency  of Communication with Friends and Family:   . Frequency of Social Gatherings with Friends and Family:   . Attends Religious Services:   . Active Member of Clubs or Organizations:   . Attends Banker Meetings:   Marland Kitchen Marital Status:    Additional Social History:   homeless   Pain Medications: See PTA Prescriptions: See PTA Over the Counter: See PTA History of alcohol / drug use?: No history of alcohol / drug abuse Longest period of sobriety (when/how long): None reported                    Sleep: Fair  Appetite:  Fair  Current Medications: Current Facility-Administered Medications  Medication Dose Route Frequency Provider Last Rate Last Admin  . QUEtiapine (SEROQUEL) tablet 50 mg  50 mg Oral QHS Roselind Messier, MD   50 mg at 11/27/19 2131  . sertraline (ZOLOFT) tablet 25 mg  25 mg Oral Daily Roselind Messier, MD   25 mg at 11/27/19 0932   Current Outpatient Medications  Medication Sig Dispense Refill  . hydrOXYzine (VISTARIL) 50 MG capsule Take 1 capsule (50 mg total) by mouth 3 (three) times daily as needed for anxiety. 60 capsule 0  . QUEtiapine (SEROQUEL) 25 MG tablet Take 2 tablets (50 mg total) by mouth at bedtime. 30 tablet 0  . sertraline (ZOLOFT) 25 MG tablet Take 1 tablet (25 mg total) by mouth daily. 30 tablet 2    Lab Results: No results found for this or any previous visit (from the past 48 hour(s)).  Blood Alcohol level:  Lab Results  Component Value Date   ETH <10 11/19/2019   ETH <10 11/29/2018    Metabolic Disorder Labs: No results found for: HGBA1C, MPG No results found for: PROLACTIN No results found for: CHOL, TRIG, HDL, CHOLHDL, VLDL, LDLCALC  Physical Findings: AIMS:  , ,  ,  ,    CIWA:    COWS:     Musculoskeletal: Strength & Muscle Tone: within normal limits Gait & Station: normal Patient leans: N/A  Psychiatric Specialty Exam: Physical Exam Constitutional:      Appearance: Normal appearance. He is normal weight.   HENT:     Head: Normocephalic and atraumatic.  Eyes:     Extraocular Movements: Extraocular movements intact.  Cardiovascular:     Rate and Rhythm: Normal rate.  Pulmonary:     Effort: Pulmonary effort is normal. No respiratory distress.  Musculoskeletal:        General: Normal range of motion.     Cervical back: Normal range of motion.  Neurological:     General: No focal deficit present.     Mental Status: He is alert and oriented to person, place, and time.     Review of Systems  Constitutional: Negative.   Respiratory: Negative.   Cardiovascular: Negative.   Musculoskeletal: Negative.  Neurological: Negative.     Blood pressure 127/67, pulse 71, temperature 97.6 F (36.4 C), temperature source Oral, resp. rate 16, height 6' (1.829 m), weight 68 kg, SpO2 98 %.Body mass index is 20.34 kg/m.  General Appearance: Casual and Fairly Groomed  Eye Contact:  Good  Speech:  Clear and Coherent  Volume:  Normal  Mood:  Dysphoric and Irritable  Affect:  Congruent  Thought Process:  Goal Directed  Orientation:  Full (Time, Place, and Person)  Thought Content:  Logical and Hallucinations: None  Suicidal Thoughts:  No  Homicidal Thoughts:  No  Memory:  Immediate;   Fair Recent;   Fair Remote;   Fair  Judgement:  Impaired  Insight:  Shallow  Psychomotor Activity:  Restlessness  Concentration:  Concentration: Fair and Attention Span: Fair  Recall:  [employer] of Knowledge:  Fair  Language:  Good  Akathisia:  No  Handed:  Right  AIMS (if indicated):     Assets:  Resilience  ADL's:  Intact  Cognition:  Impaired,  Mild  Sleep:   fair     Treatment Plan Summary: Plan Does not meet criteria for involuntary commitment.  Continue social work placement for residential assignment.  Increase Zoloft to 50 mg daily for depressed mood. Continue Seroquel 50 mg at bedtime for mood stabilization.  Mariel Craft, MD 11/28/2019, 1:39 PM

## 2019-11-28 NOTE — ED Notes (Signed)
Hourly rounding reveals patient sleeping in room. No complaints, stable, in no acute distress. Q15 minute rounds and monitoring via Security Cameras to continue. 

## 2019-11-28 NOTE — ED Notes (Signed)
Hourly rounding reveals patient awake in room. No complaints, stable, in no acute distress. Q15 minute rounds and monitoring via Security Cameras to continue. 

## 2019-11-29 DIAGNOSIS — R4183 Borderline intellectual functioning: Secondary | ICD-10-CM | POA: Diagnosis not present

## 2019-11-29 DIAGNOSIS — F4325 Adjustment disorder with mixed disturbance of emotions and conduct: Secondary | ICD-10-CM | POA: Diagnosis not present

## 2019-11-29 NOTE — ED Notes (Signed)
Hourly rounding reveals patient sleeping in room. No complaints, stable, in no acute distress. Q15 minute rounds and monitoring via Security Cameras to continue. 

## 2019-11-29 NOTE — ED Notes (Signed)
Pt noted to be awake in room. Water brought to pt per request, no other needs expressed at this time. Pt calm and cooperative, "just needing to stretch". NAD noted.

## 2019-11-29 NOTE — ED Notes (Signed)
Writer speaking to another patient in patients room with door open, and this patient walked up and stood in his door way and just stood there, when Clinical research associate asked if he needed anything he just looked, and did not speak. Patient has a bizarre affect

## 2019-11-29 NOTE — ED Notes (Signed)
Lunch tray given. 

## 2019-11-29 NOTE — ED Notes (Signed)
Hourly rounding reveals patient awake in room. No complaints, stable, in no acute distress. Q15 minute rounds and monitoring via Security Cameras to continue. 

## 2019-11-29 NOTE — ED Provider Notes (Signed)
Emergency Medicine Observation Re-evaluation Note  Paul Vaughan is a 64 y.o. male, seen on rounds today.  Pt initially presented to the ED for complaints of Psychiatric Evaluation Currently, the patient is watching TV in room. Denies any concerns.   Physical Exam  BP 128/62 (BP Location: Left Arm)   Pulse 70   Temp 98.2 F (36.8 C) (Oral)   Resp 16   Ht 6' (1.829 m)   Wt 68 kg   SpO2 99%   BMI 20.34 kg/m  Physical Exam  Physical Exam General: No apparent distress HEENT: moist mucous membranes CV: RRR Pulm: Normal WOB GI: soft and non tender MSK: no edema or cyanosis Neuro: face symmetric, moving all extremities     ED Course / MDM  EKG:    I have reviewed the labs performed to date as well as medications administered while in observation.  Recent changes in the last 24 hours include no new labs> Zoloft increased for depressed mood.  Plan  Current plan is for SW for placement.   Patient is not under full IVC at this time.   Concha Se, MD 11/29/19 [id number]

## 2019-11-29 NOTE — ED Notes (Signed)
Report to include Situation, Background, Assessment, and Recommendations received from Jadeka RN. Patient alert and oriented, warm and dry, in no acute distress. Patient denies SI, HI, AVH and pain. Patient made aware of Q15 minute rounds and security cameras for their safety. Patient instructed to come to me with needs or concerns. 

## 2019-11-29 NOTE — Progress Notes (Signed)
[facility] MD Progress Note  11/29/2019 9:29 AM Paul Vaughan  MRN:  [id number] Subjective: "I when can I get out of here?" Principal Problem: Adjustment disorder with mixed disturbance of emotions and conduct Diagnosis: Principal Problem:   Adjustment disorder with mixed disturbance of emotions and conduct Active Problems:   Borderline intellectual disability  Total Time spent with patient: 15 minutes  Past Psychiatric History: Schizoaffective disorder, dementia  Per intake HPI 11/20/2019 from TTS: Paul Vaughan is an 64 y.o. male who presents to the ER via law enforcement, after his Care Home voiced concerns about the recent changes in his behaviors and the state of his mental health. Per the patient, he's unsure why he was brought to the ER. Throughout the interview, he denied SI/HI and AV/H. He denies any changes in his behaviors and reports of having no problems at the care home. Per the "Care Home" L&M ([phone]) he started to wander from the Group Home and the times he stayed away was increasing. At times he would stare and be confused about what was going on and who the staff members were. For the last several weeks, he has gone to different neighbors' homes, knocking on their windows and staring through the windows at them. Law enforcement has been called several times. Last night, he was at train deport standing still and staring. Law enforcement brought him back to the living facility. While at the bus depot or in route home, he defecated on his self. Due patient presentation and affect, law enforcement brought him to the ER for an evaluation. Care Home states, they have notice him having behavior changes approximately every six months. The patient has been in the same home for two years. They also suspect the patient has been taking his medications because they found pills in his room.  11/28/2019:  On evaluation today, patient expresses frustration that he cannot go back to  his previous group home.  He does admit that he did not get along with people there, however he does feel frustrated that he does not have a place to live.  He understands that is the weekend, and he is oriented to person, date, time, and situation and is hopeful for discharge within the next week.  He is denying any suicidal or homicidal ideation, plan, or intent.  He denies any auditory visual hallucination and does not appear to be responding to internal stimuli.  He denies any cravings for tobacco.  11/29/2019: Nurses report patient had inappropriate behavior overnight.  Stripped out of his clothing with abnormal posturing in front of the security desk.  When escorted back to room he was actively masturbating.  Today, patient is minimally conversant.  His room is clean, and he is sitting calmly.  He denies SI, HI, AVH.  No complaints or requests.  Past Medical History: History reviewed. No pertinent past medical history. History reviewed. No pertinent surgical history. Family History: No family history on file. Family Psychiatric  History: none  Social History:  Social History   Substance and Sexual Activity  Alcohol Use No     Social History   Substance and Sexual Activity  Drug Use No    Social History   Socioeconomic History  . Marital status: Single    Spouse name: Not on file  . Number of children: Not on file  . Years of education: Not on file  . Highest education level: Not on file  Occupational History  . Not on file  Tobacco Use  .  Smoking status: Current Every Day Smoker    Packs/day: 1.00    Types: Cigarettes  . Smokeless tobacco: Never Used  Substance and Sexual Activity  . Alcohol use: No  . Drug use: No  . Sexual activity: Not on file  Other Topics Concern  . Not on file  Social History Narrative  . Not on file   Social Determinants of Health   Financial Resource Strain:   . Difficulty of Paying Living Expenses:   Food Insecurity:   . Worried About  Programme researcher, broadcasting/film/video in the Last Year:   . Barista in the Last Year:   Transportation Needs:   . Freight forwarder (Medical):   Marland Kitchen Lack of Transportation (Non-Medical):   Physical Activity:   . Days of Exercise per Week:   . Minutes of Exercise per Session:   Stress:   . Feeling of Stress :   Social Connections:   . Frequency of Communication with Friends and Family:   . Frequency of Social Gatherings with Friends and Family:   . Attends Religious Services:   . Active Member of Clubs or Organizations:   . Attends Banker Meetings:   Marland Kitchen Marital Status:    Additional Social History:   homeless   Pain Medications: See PTA Prescriptions: See PTA Over the Counter: See PTA History of alcohol / drug use?: No history of alcohol / drug abuse Longest period of sobriety (when/how long): None reported                    Sleep: Fair  Appetite:  Fair  Current Medications: Current Facility-Administered Medications  Medication Dose Route Frequency Provider Last Rate Last Admin  . QUEtiapine (SEROQUEL) tablet 50 mg  50 mg Oral QHS Roselind Messier, MD   50 mg at 11/28/19 2116  . sertraline (ZOLOFT) tablet 50 mg  50 mg Oral Daily Mariel Craft, MD       Current Outpatient Medications  Medication Sig Dispense Refill  . hydrOXYzine (VISTARIL) 50 MG capsule Take 1 capsule (50 mg total) by mouth 3 (three) times daily as needed for anxiety. 60 capsule 0  . QUEtiapine (SEROQUEL) 25 MG tablet Take 2 tablets (50 mg total) by mouth at bedtime. 30 tablet 0  . sertraline (ZOLOFT) 25 MG tablet Take 1 tablet (25 mg total) by mouth daily. 30 tablet 2    Lab Results: No results found for this or any previous visit (from the past 48 hour(s)).  Blood Alcohol level:  Lab Results  Component Value Date   ETH <10 11/19/2019   ETH <10 11/29/2018    Metabolic Disorder Labs: No results found for: HGBA1C, MPG No results found for: PROLACTIN No results found for: CHOL,  TRIG, HDL, CHOLHDL, VLDL, LDLCALC  Physical Findings: AIMS:  , ,  ,  ,    CIWA:    COWS:     Musculoskeletal: Strength & Muscle Tone: within normal limits Gait & Station: normal Patient leans: N/A  Psychiatric Specialty Exam: Physical Exam Constitutional:      Appearance: Normal appearance. He is normal weight.  HENT:     Head: Normocephalic and atraumatic.  Eyes:     Extraocular Movements: Extraocular movements intact.  Cardiovascular:     Rate and Rhythm: Normal rate.  Pulmonary:     Effort: Pulmonary effort is normal. No respiratory distress.  Musculoskeletal:        General: Normal range of motion.  Cervical back: Normal range of motion.  Neurological:     General: No focal deficit present.     Mental Status: He is alert and oriented to person, place, and time.     Review of Systems  Constitutional: Negative.   Respiratory: Negative.   Cardiovascular: Negative.   Musculoskeletal: Negative.   Neurological: Negative.     Blood pressure 128/62, pulse 70, temperature 98.2 F (36.8 C), temperature source Oral, resp. rate 16, height 6' (1.829 m), weight 68 kg, SpO2 99 %.Body mass index is 20.34 kg/m.  General Appearance: Casual and Neat  Eye Contact:  Good  Speech:  Clear and Coherent  Volume:  Normal  Mood:  Dysphoric and Irritable  Affect:  Congruent  Thought Process:  Goal Directed  Orientation:  Full (Time, Place, and Person)  Thought Content:  Logical and Hallucinations: None  Suicidal Thoughts:  No  Homicidal Thoughts:  No  Memory:  Immediate;   Fair Recent;   Fair Remote;   Fair  Judgement:  Impaired  Insight:  Shallow  Psychomotor Activity:  Restlessness  Concentration:  Concentration: Fair and Attention Span: Fair  Recall:  [employer] of Knowledge:  Fair  Language:  Good  Akathisia:  No  Handed:  Right  AIMS (if indicated):     Assets:  Resilience  ADL's:  Intact  Cognition:  Impaired,  Mild  Sleep:   fair     Treatment Plan  Summary: Plan Does not meet criteria for involuntary commitment.  Continue social work placement for residential assignment.  Increase Zoloft to 50 mg daily for depressed mood on 11/29/2019. Continue Seroquel 50 mg at bedtime for mood stabilization.  Mariel Craft, MD 11/29/2019, 9:29 AM

## 2019-11-30 NOTE — ED Notes (Signed)
Hourly rounding reveals patient in rest room. No complaints, stable, in no acute distress. Q15 minute rounds and monitoring via Security Cameras to continue. 

## 2019-11-30 NOTE — ED Notes (Signed)
Snack given to patient at this time.

## 2019-11-30 NOTE — ED Notes (Signed)
Hourly rounding reveals patient sleeping in room. No complaints, stable, in no acute distress. Q15 minute rounds and monitoring via Security Cameras to continue. 

## 2019-11-30 NOTE — TOC Progression Note (Addendum)
Transition of Care [facility]) - Progression Note    Patient Details  Name: Paul Vaughan MRN: [id number] Date of Birth: 1956/04/12  [facility]) CM/SW Contact  Marina Goodell Phone Number: [phone] 11/30/2019, 4:18 PM  Clinical Narrative:     CSW spoke with [facility] DSS for patient update.  Patient will be assessed on 12/01/2019 by Achilles Dunk around 12:00PM for possible group home placement. Ms. Marlyne Beards is concerned that patient's behaviors over the weekend are evidence of escalation and stated the patient "has never done something like this before."  CSW stated that I would reach out to psychiatry about patient's behaviors and possible reevaluation.  CSW did explain to Ms. Marlyne Beards the patient denies SI/HI/ AV/H and has not exhibited threatening behaviors while in the ED/BHU, therefore may still not meet criteria for IVC or inpatient care.  Expected Discharge Plan: Group Home Barriers to Discharge: ED Patient Insisting on an Alternate Living Situation/Facility, ED Facility/Family Refusing to Allow Patient to Return, ED Unable to Make Contact with Facility/Family (Unable to make contact w/ legal guardian Paul Vaughan [phone])  Expected Discharge Plan and Services Expected Discharge Plan: Group Home In-house Referral: Clinical Social Work     Living arrangements for the past 2 months: Group Home                                       Social Determinants of Health (SDOH) Interventions    Readmission Risk Interventions No flowsheet data found.

## 2019-12-01 NOTE — ED Notes (Addendum)
Pt went and opened the door to another pt's room. Pt redirected and informed that he is not to go and open the door to any of the other pt's room. Pt told he can only go into his room or stay out in the dayroom. Pt verbalized understanding.

## 2019-12-01 NOTE — ED Provider Notes (Signed)
Emergency Medicine Observation Re-evaluation Note  Paul Vaughan is a 64 y.o. male, seen on rounds today.  Pt initially presented to the ED for complaints of Psychiatric Evaluation Currently, the patient is stable with no complaints this morning.  Physical Exam  BP 119/65   Pulse 81   Temp 97.7 F (36.5 C) (Oral)   Resp 18   Ht 6' (1.829 m)   Wt 68 kg   SpO2 94%   BMI 20.34 kg/m  Physical Exam   General: No apparent distress HEENT: moist mucous membranes CV: RRR Pulm: Normal WOB GI: soft and non tender MSK: no edema or cyanosis Neuro: face symmetric, moving all extremities    ED Course / MDM  EKG:    I have reviewed the labs performed to date as well as medications administered while in observation.  No new labs or changes overnight. Plan  Current plan is for SW placement. Patient is not under full IVC at this time.   Don Perking, [place], MD 12/01/19 [phone]

## 2019-12-01 NOTE — ED Notes (Signed)
Hand hygiene offered to patient and snack tray and soda given.  

## 2019-12-01 NOTE — ED Notes (Addendum)
Pt asking when he will be discharged. Educated that SW is assisting with placement and will let him know once they get him somewhere to go.   Pt was to have [facility] representative come and meet with him today but no one showed up. SW informed that no one came to meet with pt.

## 2019-12-02 NOTE — ED Notes (Signed)
Report to include Situation, Background, Assessment, and Recommendations received from RN. Patient alert and oriented, warm and dry, in no acute distress. Patient denies SI, HI, AVH and pain. Patient made aware of Q15 minute rounds and security cameras for their safety. Patient instructed to come to me with needs or concerns.  

## 2019-12-02 NOTE — ED Notes (Signed)
Hourly rounding reveals patient in room. No complaints, stable, in no acute distress. Q15 minute rounds and monitoring via Security Cameras to continue. 

## 2019-12-02 NOTE — ED Notes (Signed)
Hourly rounding reveals patient in room. No complaints, stable, in no acute distress. Q15 minute rounds and monitoring via Rover and Officer to continue.   

## 2019-12-02 NOTE — ED Notes (Signed)
Trying to encourage patient to take shower patient still stating no

## 2019-12-02 NOTE — ED Notes (Signed)
Pt given meal tray and a cup of sprite.  

## 2019-12-02 NOTE — TOC Progression Note (Addendum)
Transition of Care [facility]) - Progression Note    Patient Details  Name: Paul Vaughan MRN: [id number] Date of Birth: 1955/06/07  Transition of Care [facility]) CM/SW [place], [place] Phone Number: [phone] 12/02/2019, 9:37 AM  Clinical Narrative:     CSW spoke with Paul Vaughan, Paul Vaughan, [phone], for possible placement.  Paul Vaughan stated she will come by this morning to speak with CSW and possibly assess the patient.  10:55AM Paul Vaughan met with patient for assessment.  Paul Vaughan stated the patient is not appropriate for [facility].  This CSW left voicemail for [facility], [phone] and Paul Vaughan [phone], with update.   Expected Discharge Plan: Group Home Barriers to Discharge: ED Patient Insisting on an Alternate Living Situation/Facility, ED Facility/Family Refusing to Allow Patient to Return, ED Unable to Make Contact with Facility/Family (Unable to make contact w/ legal guardian Paul Vaughan [phone])  Expected Discharge Plan and Services Expected Discharge Plan: Group Home In-house Referral: Clinical Social Work     Living arrangements for the past 2 months: Group Home                                       Social Determinants of Health (SDOH) Interventions    Readmission Risk Interventions No flowsheet data found.

## 2019-12-02 NOTE — ED Notes (Signed)
juice was given to patient at this time

## 2019-12-02 NOTE — ED Provider Notes (Signed)
Emergency Medicine Observation Re-evaluation Note  Paul Vaughan is a 64 y.o. male, seen on rounds today.  Pt initially presented to the ED for complaints of Psychiatric Evaluation  Patient without acute events overnight, he is calm and has not displayed any agitation   Physical Exam  BP 107/61 (BP Location: Left Arm)    Pulse 67    Temp 98 F (36.7 C) (Oral)    Resp 18    Ht 1.829 m (6')    Wt 68 kg    SpO2 98%    BMI 20.34 kg/m  Physical Exam Sitting quietly, No aggression, normal affect, normal behavior Neuro intact  ED Course / MDM  . Patient has been seen by social work, attempting to find group home placement for him although thus far not successful   Jene Every, MD 12/02/19 [phone]

## 2019-12-03 NOTE — ED Notes (Signed)
Hourly rounding reveals patient in room. No complaints, stable, in no acute distress. Q15 minute rounds and monitoring via Security Cameras to continue. 

## 2019-12-03 NOTE — ED Provider Notes (Signed)
Emergency Medicine Observation Re-evaluation Note  Paul Vaughan is a 64 y.o. male, seen on rounds today.  Pt initially presented to the ED for complaints of Psychiatric Evaluation Currently, the patient is awake and Calm.  Denies complaints.  Nursing notes reviewed.  No acute events overnight..  Physical Exam  BP 107/61 (BP Location: Left Arm)   Pulse 67   Temp 98 F (36.7 C) (Oral)   Resp 18   Ht 6' (1.829 m)   Wt 68 kg   SpO2 98%   BMI 20.34 kg/m  Physical Exam Awake and alert, no distress.  Moving all extremities with normal balance, neurologically intact.  ED Course / MDM  EKG:    I have reviewed the labs performed to date as well as medications administered while in observation.  Recent changes in the last 24 hours include none. Plan  Current plan is for planned discharge to group home. Patient is not under full IVC at this time.   Sharman Cheek, MD 12/03/19 1031

## 2019-12-04 NOTE — ED Notes (Signed)
Hourly rounding reveals patient in room. No complaints, stable, in no acute distress. Q15 minute rounds and monitoring via Security Cameras to continue. 

## 2019-12-04 NOTE — ED Notes (Signed)
Pt up to nursing station requesting sprite to drink, pt given drink.

## 2019-12-04 NOTE — ED Notes (Addendum)
Pt requested and provided with ginger ale and graham crackers

## 2019-12-04 NOTE — ED Notes (Signed)
Talking with patient trying to get patient to take shower ,patient still refused.

## 2019-12-04 NOTE — ED Notes (Signed)
VOL  PENDING  PLACEMENT 

## 2019-12-05 NOTE — ED Notes (Signed)
Hourly rounding reveals patient sleeping in room. No complaints, stable, in no acute distress. Q15 minute rounds and monitoring via Security Cameras to continue. 

## 2019-12-05 NOTE — ED Notes (Signed)
Pt given meal tray.

## 2019-12-05 NOTE — ED Notes (Signed)
Hourly rounding reveals patient awake in room. No complaints, stable, in no acute distress. Q15 minute rounds and monitoring via Security Cameras to continue. 

## 2019-12-05 NOTE — ED Provider Notes (Signed)
Emergency Medicine Observation Re-evaluation Note  Steffen Hase is a 64 y.o. male initially presents to the emergency department from his group facility after running away.  Patient not taking medications.  Physical Exam  BP (!) 96/50   Pulse 60   Temp 98.3 F (36.8 C) (Oral)   Resp 18   Ht 6' (1.829 m)   Wt 68 kg   SpO2 98%   BMI 20.34 kg/m  No acute events over the past 24 hours  ED Course / MDM  Medical work-up is been largely nonrevealing.  Patient has chronic kidney disease largely unchanged from baseline.. Plan  Current plan is for group home placement. Patient is not under full IVC at this time.   Minna Antis, MD 12/05/19 1425

## 2019-12-05 NOTE — ED Notes (Signed)
Report to include Situation, Background, Assessment, and Recommendations received from [name] RN. Patient alert and oriented, warm and dry, in no acute distress. Patient denies SI, HI, AH and pain although he he states "I see visions". Patient made aware of Q15 minute rounds and security cameras for their safety. Patient instructed to come to me with needs or concerns.

## 2019-12-06 NOTE — ED Notes (Signed)
Hourly rounding reveals patient sleeping in room. No complaints, stable, in no acute distress. Q15 minute rounds and monitoring via Security Cameras to continue. 

## 2019-12-06 NOTE — ED Provider Notes (Signed)
Emergency Medicine Observation Re-evaluation Note  Paul Vaughan is a 64 y.o. male, seen on rounds today.  Pt initially presented to the ED for complaints of Psychiatric Evaluation Currently, the patient is resting comfortably  Physical Exam  BP (!) 105/61 (BP Location: Right Arm)   Pulse 67   Temp 98.6 F (37 C) (Oral)   Resp 16   Ht 6' (1.829 m)   Wt 68 kg   SpO2 97%   BMI 20.34 kg/m  Physical Exam Patient is resting comfortably in bed.  Patient is in no distress. ED Course / MDM  EKG:    I have reviewed the labs performed to date as well as medications administered while in observation. Plan  Patient is not under IVC at this time patient is awaiting placement in a group home.   Arnaldo Natal, MD 12/06/19 [phone]

## 2019-12-06 NOTE — ED Notes (Signed)
Hourly rounding reveals patient awake in room. No complaints, stable, in no acute distress. Q15 minute rounds and monitoring via Security Cameras to continue. 

## 2019-12-06 NOTE — ED Notes (Signed)
Meal tray given 

## 2019-12-07 NOTE — TOC Progression Note (Addendum)
Transition of Care [facility]) - Progression Note    Patient Details  Name: Paul Vaughan MRN: [id number] Date of Birth: 1956/03/18  Transition of Care [facility]) CM/SW Contact  Marina Goodell Phone Number: [phone] 12/07/2019, 1:22 PM  Clinical Narrative:     CSW spoke with Paul Vaughan DSS, legal guardian for status update and to inquire about patient's showering habits.  Patient has refused to shower since he was brought to the ED.  Ms. Paul Vaughan stated the patient does not shower often, and therefore his refusal to shower is baseline behavior.  Expected Discharge Plan: Group Home Barriers to Discharge: ED Patient Insisting on an Alternate Living Situation/Facility, ED Facility/Family Refusing to Allow Patient to Return, ED Unable to Make Contact with Facility/Family (Unable to make contact w/ legal guardian Paul Vaughan [phone])  Expected Discharge Plan and Services Expected Discharge Plan: Group Home In-house Referral: Clinical Social Work     Living arrangements for the past 2 months: Group Home                                       Social Determinants of Health (SDOH) Interventions    Readmission Risk Interventions No flowsheet data found.

## 2019-12-07 NOTE — ED Notes (Signed)
Patient came up to door and wanted to go to bathroom, flat affect, but did ask if He was going to get to leave , nurse let him know that she check and see what treatment plan was and would keep him updated, Patient said " Ok" Patient is without any behavioral issues at this time, will continue to monitor, he denies Si/hii or avh to the nurse.

## 2019-12-07 NOTE — ED Provider Notes (Signed)
Emergency Medicine Observation Re-evaluation Note  Paul Vaughan is a 64 y.o. male, seen on rounds today.  Pt initially presented to the ED for complaints of Psychiatric Evaluation Patient here in the emergency department after running away from his group home, group home is refusing to take the patient back  Physical Exam  BP (!) 116/64 (BP Location: Left Arm)   Pulse 68   Temp 98.1 F (36.7 C) (Oral)   Resp 16   Ht 1.829 m (6')   Wt 68 kg   SpO2 98%   BMI 20.34 kg/m  Physical Exam Constitutional:      Appearance: Normal appearance.  Neurological:     Mental Status: He is alert.  Psychiatric:        Mood and Affect: Mood normal.        Behavior: Behavior normal.     ED Course / MDM  EKG:    I have reviewed the labs performed to date as well as medications administered while in observation. Plan  Patient is going to be evaluated tomorrow by new group home, will remain under observation until that time Patient is not under full IVC at this time.   Jene Every, MD 12/07/19 1434

## 2019-12-07 NOTE — TOC Progression Note (Signed)
Transition of Care [facility]) - Progression Note    Patient Details  Name: Paul Vaughan MRN: [id number] Date of Birth: 06/04/1955  Transition [facility] Care [facility]) CM/SW Contact  Paul Vaughan Phone Number: [phone] 12/07/2019, 1:26 PM  Clinical Narrative:     CSW spoke with Paul Vaughan DSS, legal guardian.  Ms. Paul Vaughan stated Paul Vaughan will come to assess patient for group home placement tomorrow 12/08/2019 between 9:30-11:00AM.    Expected Discharge Plan: Group Home Barriers to Discharge: ED Patient Insisting on an Alternate Living Situation/Facility, ED Facility/Family Refusing to Allow Patient to Return, ED Unable to Make Contact with Facility/Family (Unable to make contact w/ legal guardian Paul Vaughan [phone])  Expected Discharge Plan and Services Expected Discharge Plan: Group Home In-house Referral: Clinical Social Work     Living arrangements for the past 2 months: Group Home                                       Social Determinants of Health (SDOH) Interventions    Readmission Risk Interventions No flowsheet data found.

## 2019-12-08 NOTE — ED Notes (Addendum)
[facility] representative came to speak with pt but pt refused to speak with him. Representative given update of how pt's stay has been here. He said he will call back and inform the SW what he has decided since the pt refused to speak to him. Pt denies SI/HI/AVH on assessment

## 2019-12-08 NOTE — ED Notes (Signed)
Tried to encourage patient to take shower, patient shakes his head and state's NO.

## 2019-12-08 NOTE — ED Notes (Signed)
VOL  PENDING  PLACEMENT 

## 2019-12-09 NOTE — ED Provider Notes (Signed)
Emergency Medicine Observation Re-evaluation Note  Ignatius Kloos is a 64 y.o. male, seen on rounds today.  Pt initially presented to the ED for complaints of Psychiatric Evaluation Currently, the patient is calm, resting comfortably.  Physical Exam  BP (!) 115/56 (BP Location: Left Arm)   Pulse 71   Temp 98.2 F (36.8 C) (Oral)   Resp 18   Ht 6' (1.829 m)   Wt 68 kg   SpO2 97%   BMI 20.34 kg/m  Physical Exam  GEN: No acute distress, resting comfortably HEENT: NCAT Neck: Supple CV: Regular rate and rhythm Lungs: Normal WOB, no resp distress Neuro: AOx3, no focal deficits Psych: Resting  ED Course / MDM  EKG:    I have reviewed the labs performed to date as well as medications administered while in observation.  Recent changes in the last 24 hours include None, labs reviewed and are unremarkable. Plan  Current plan is for psych disposition today. Patient is not under full IVC at this time.   Shaune Pollack, MD 12/09/19 1108

## 2019-12-09 NOTE — ED Notes (Signed)
Snacks given 

## 2019-12-09 NOTE — ED Notes (Signed)

## 2019-12-10 NOTE — ED Notes (Signed)
Vol /pending placement 

## 2019-12-10 NOTE — ED Notes (Signed)
Pt given breakfast tray, ginger ale and orange juice.

## 2019-12-10 NOTE — ED Provider Notes (Signed)
Emergency Medicine Observation Re-evaluation Note  Paul Vaughan is a 64 y.o. male, seen on rounds today.  Pt initially presented to the ED for complaints of Psychiatric Evaluation Currently, the patient is currently resting.  Physical Exam  BP (!) 136/86 (BP Location: Right Arm)   Pulse 68   Temp 98.3 F (36.8 C) (Oral)   Resp 16   Ht 6' (1.829 m)   Wt 68 kg   SpO2 97%   BMI 20.34 kg/m  Physical Exam  Gen:  no acute distress Resp:  no difficulty breathing, no accessory muscle usage Neuro:  moving all four extremities Psych:  calm and cooperative   ED Course / MDM  EKG:    I have reviewed the labs performed to date as well as medications administered while in observation.  Recent changes in the last 24 hours include none. Plan  Current plan is for psych sw Patient is  Not under full IVC at this time.   Willy Eddy, MD 12/10/19 [phone]

## 2019-12-11 NOTE — ED Provider Notes (Signed)
Emergency Medicine Observation Re-evaluation Note  Paul Vaughan is a 64 y.o. male, seen on rounds today.  Pt initially presented to the ED for complaints of Psychiatric Evaluation Currently, the patient is stable with no complaints this morning.  Physical Exam  BP (!) 123/63 (BP Location: Left Arm)   Pulse 67   Temp 98.4 F (36.9 C) (Oral)   Resp 18   Ht 6' (1.829 m)   Wt 68 kg   SpO2 97%   BMI 20.34 kg/m  Physical Exam   General: No apparent distress HEENT: moist mucous membranes CV: RRR Pulm: Normal WOB GI: soft and non tender MSK: no edema or cyanosis Neuro: face symmetric, moving all extremities    ED Course / MDM  EKG:    I have reviewed the labs performed to date as well as medications administered while in observation.  No acute changes overnight or any new labs. Plan  Current plan is for psych disposition. Patient is not under full IVC at this time.   Don Perking, [place], MD 12/11/19 [phone]

## 2019-12-11 NOTE — ED Notes (Signed)
Pt given ice cream and ginger ale at his request for snack.

## 2019-12-11 NOTE — TOC Progression Note (Signed)
Transition of Care [facility]) - Progression Note    Patient Details  Name: Paul Vaughan MRN: [id number] Date of Birth: January 13, 1956  Transition of Care [facility]) CM/SW Contact  Marina Goodell Phone Number: [phone] 12/11/2019, 9:18 AM  Clinical Narrative:     CSW spoke with Forde Radon DSS/APS guardian, [phone].  Ms. Inda Merlin, stated the patient will d/c to [facility] group [facility] on Monday 12/14/2019.  Ms. Inda Merlin will transport the patient in the afternoon. No ETA given. Ms. Inda Merlin stated she would contact this CSW if there are any changes in disposition plan.  Patient will need TB test with confirmation of negative result and a rapid COVID test.  This CSW will remind weekend CSW to request TB test and rapid COVID test.     Expected Discharge Plan: Group Home Barriers to Discharge: ED Patient Insisting on an Alternate Living Situation/Facility, ED Facility/Family Refusing to Allow Patient to Return, ED Unable to Make Contact with Facility/Family (Unable to make contact w/ legal guardian [name] [phone])  Expected Discharge Plan and Services Expected Discharge Plan: Group Home In-house Referral: Clinical Social Work     Living arrangements for the past 2 months: Group Home                                       Social Determinants of Health (SDOH) Interventions    Readmission Risk Interventions No flowsheet data found.

## 2019-12-11 NOTE — ED Notes (Signed)
Pt given clean sheet and blanket per pt request.

## 2019-12-12 NOTE — ED Notes (Signed)
Hourly rounding reveals patient awake in room. No complaints, stable, in no acute distress. Q15 minute rounds and monitoring via Security Cameras to continue. 

## 2019-12-12 NOTE — ED Notes (Signed)
Pt asking to move into room 3. When asked if there was a reason he wanted to move rooms pt states " I just want to move into that room". Pt informed that the other room is currently clean and another pt will need to be placed into the clean room. Pt informed that he would need to remain in room 2 at this time.

## 2019-12-12 NOTE — ED Notes (Signed)
Hourly rounding reveals patient sleeping in room. No complaints, stable, in no acute distress. Q15 minute rounds and monitoring via Security Cameras to continue. 

## 2019-12-12 NOTE — ED Provider Notes (Signed)
Emergency Medicine Observation Re-evaluation Note  Paul Vaughan is a 64 y.o. male, seen on rounds today.  Pt initially presented to the ED for complaints of Psychiatric Evaluation Currently, the patient is resting comfortably in the BHU, stable and with no complaints..  Physical Exam  BP 118/65 (BP Location: Left Arm)   Pulse 64   Temp 98.4 F (36.9 C) (Oral)   Resp 18   Ht 6' (1.829 m)   Wt 68 kg   SpO2 96%   BMI 20.34 kg/m  Physical Exam Constitutional:      General: He is not in acute distress.    Appearance: Normal appearance. He is not ill-appearing.  HENT:     Head: Normocephalic.     Nose: No congestion.  Eyes:     Pupils: Pupils are equal, round, and reactive to light.  Cardiovascular:     Rate and Rhythm: Normal rate.  Pulmonary:     Effort: Pulmonary effort is normal.  Abdominal:     General: There is no distension.  Musculoskeletal:        General: No deformity.  Neurological:     General: No focal deficit present.     ED Course / MDM  EKG:    I have reviewed the labs performed to date as well as medications administered while in observation.  Recent changes in the last 24 hours include no change or acute events overnight.. Plan  Current plan is for voluntary placement per psychiatry. Patient is not under full IVC at this time.   Delton Prairie, MD 12/12/19 [phone]

## 2019-12-12 NOTE — TOC Progression Note (Signed)
Transition of Care [facility]) - Progression Note    Patient Details  Name: Kamir Selover MRN: [id number] Date of Birth: [birth year]  Transition of Care [facility]) CM/SW Contact  Larwance Rote, LCSW Phone Number: 12/12/2019, 3:39 PM  Clinical Narrative: Discharge to  [facility] group home on Monday 12/14/2019.   Collateral: DSS/APS guardian, Forde Radon, [phone].     Needs prior to discharge: Patient will need TB (chest xray test with confirmation of negative result and a rapid COVID test.     Expected Discharge Plan: Group Home Barriers to Discharge: ED Patient Insisting on an Alternate Living Situation/Facility, ED Facility/Family Refusing to Allow Patient to Return, ED Unable to Make Contact with Facility/Family (Unable to make contact w/ legal guardian [name] [phone])  Expected Discharge Plan and Services Expected Discharge Plan: Group Home In-house Referral: Clinical Social Work     Living arrangements for the past 2 months: Group Home                                       Social Determinants of Health (SDOH) Interventions    Readmission Risk Interventions No flowsheet data found.

## 2019-12-12 NOTE — ED Notes (Signed)
Report to include Situation, Background, Assessment, and Recommendations received from [facility]. Patient alert and oriented, warm and dry, in no acute distress. Patient denies SI, HI, AH and pain but does state he sees "images". Patient made aware of Q15 minute rounds and security cameras for their safety. Patient instructed to come to me with needs or concerns.

## 2019-12-13 ENCOUNTER — Emergency Department: Payer: Medicare Other

## 2019-12-13 LAB — SARS CORONAVIRUS 2 BY RT PCR (HOSPITAL ORDER, PERFORMED IN ~~LOC~~ HOSPITAL LAB): SARS Coronavirus 2: NEGATIVE

## 2019-12-13 NOTE — ED Notes (Signed)
Report to include situation, background, assessment and recommendations from [facility]. Patient sleeping, respirations regular and unlabored. Q15 minute rounds and security camera observation to continue.

## 2019-12-13 NOTE — ED Notes (Addendum)
Patient sleeping

## 2019-12-13 NOTE — ED Notes (Signed)
Asked pt when the last time he showered, did not answer. Asked pt if he would like some wipes to clean himself and new scrubs to change into again did not answer. Ask pt to answer pt refused clean scrubs and wipes.

## 2019-12-13 NOTE — ED Notes (Signed)
Hourly rounding reveals patient sleeping in room. No complaints, stable, in no acute distress. Q15 minute rounds and monitoring via Security Cameras to continue. 

## 2019-12-13 NOTE — ED Notes (Signed)
Hourly rounding reveals patient asleep in room. No complaints, stable, in no acute distress. Q15 minute rounds and monitoring via Security Cameras to continue. 

## 2019-12-13 NOTE — ED Notes (Signed)
Pt given meal tray.

## 2019-12-13 NOTE — ED Notes (Signed)
Pt up to bathroom.

## 2019-12-13 NOTE — ED Provider Notes (Signed)
Emergency Medicine Observation Re-evaluation Note  Paul Vaughan is a 64 y.o. male, seen on rounds today.  Pt initially presented to the ED for complaints of Psychiatric Evaluation Currently, the patient is stable no complaints overnight.  Physical Exam  BP (!) 115/60 (BP Location: Left Arm)   Pulse 64   Temp 97.9 F (36.6 C) (Oral)   Resp 16   Ht 6' (1.829 m)   Wt 68 kg   SpO2 98%   BMI 20.34 kg/m  Physical Exam   General: No apparent distress HEENT: moist mucous membranes CV: RRR Pulm: Normal WOB GI: soft and non tender MSK: no edema or cyanosis Neuro: face symmetric, moving all extremities    ED Course / MDM  EKG:    I have reviewed the labs performed to date as well as medications administered while in observation.  No changes overnight or any new labs this morning. Plan  Current plan is for psychiatric disposition. Patient is not under full IVC at this time.   Don Perking, [place], MD 12/13/19 [phone]

## 2019-12-13 NOTE — ED Notes (Signed)
Pt taken to xray by EDT.

## 2019-12-13 NOTE — ED Notes (Signed)
Hourly rounding reveals patient awake in room. No complaints, stable, in no acute distress. Q15 minute rounds and monitoring via Security Cameras to continue. 

## 2019-12-13 NOTE — ED Notes (Signed)
Pt refused sandwich tray and snack.  Just wanted a drink which was provided

## 2019-12-14 NOTE — ED Provider Notes (Signed)
Emergency Medicine Observation Re-evaluation Note  Paul Vaughan is a 64 y.o. male, seen on rounds today.  Pt initially presented to the ED for complaints of Psychiatric Evaluation Currently, the patient is resting comfortably.  Physical Exam  BP 115/71 (BP Location: Left Arm)   Pulse 60   Temp 99.8 F (37.7 C) (Oral)   Resp 18   Ht 6' (1.829 m)   Wt 68 kg   SpO2 95%   BMI 20.34 kg/m  Physical Exam Vitals and nursing note reviewed.  HENT:     Head: Normocephalic and atraumatic.     Right Ear: External ear normal.     Left Ear: External ear normal.     Nose: Nose normal.  Eyes:     Conjunctiva/sclera: Conjunctivae normal.  Cardiovascular:     Pulses: Normal pulses.  Pulmonary:     Effort: Pulmonary effort is normal.  Abdominal:     General: There is no distension.  Neurological:     Mental Status: He is alert.     ED Course / MDM  EKG:    I have reviewed the labs performed to date as well as medications administered while in observation.  Recent changes in the last 24 hours include none. Plan  Discharge to group home. Discharged in stable condition.    Gilles Chiquito, MD 12/14/19 [phone]

## 2019-12-14 NOTE — ED Notes (Signed)
Hourly rounding reveals patient sleeping in room. No complaints, stable, in no acute distress. Q15 minute rounds and monitoring via Security Cameras to continue. 

## 2019-12-14 NOTE — ED Notes (Signed)
Hourly rounding reveals patient asleep in room. No complaints, stable, in no acute distress. Q15 minute rounds and monitoring via Security Cameras to continue. 

## 2019-12-14 NOTE — ED Notes (Signed)
Patient voiced understanding of going back to group home, was hesitant to leave, but then got dressed and headed for the door, Nurse gave him the rest of His belongings and He was transported back to His group home. Patient remained calm and cooperative.

## 2019-12-14 NOTE — TOC Transition Note (Addendum)
Transition of Care [facility]) - CM/SW Discharge Note   Patient Details  Name: Paul Vaughan MRN: [id number] Date of Birth: 11-25-1955  Transition of Care [facility]) CM/SW Contact:  Glenarden Cellar, RN Phone Number: 12/14/2019, 10:06 AM   Clinical Narrative:    LVMM for Forde Radon DSS/APS guardian, [phone] requesting callback for confirmation of discharge plan to group home today with DSS guardian transporting this afternoon.    Final next level of care: Group Home Barriers to Discharge: ED Patient Insisting on an Alternate Living Situation/Facility, ED Facility/Family Refusing to Allow Patient to Return, ED Unable to Make Contact with Facility/Family (Unable to make contact w/ legal guardian Forde Radon ([phone])   Patient Goals and CMS Choice Patient states their goals for this hospitalization and ongoing recovery are:: Patient does not want to go back to group home.      Discharge Placement                       Discharge Plan and Services In-house Referral: Clinical Social Work                                   Social Determinants of Health (SDOH) Interventions     Readmission Risk Interventions No flowsheet data found.

## 2019-12-14 NOTE — ED Notes (Signed)
Patient ate 100% of breakfast, and beverage, he is without any behavioral issues, no Si/hi or avh noted.

## 2019-12-14 NOTE — TOC Transition Note (Addendum)
Transition of Care [facility]) - CM/SW Discharge Note   Patient Details  Name: Paul Vaughan MRN: [id number] Date of Birth: Jul 02, 1955  Transition of Care [facility]) CM/SW Contact:  Effingham Cellar, RN Phone Number: 12/14/2019, 2:03 PM   Clinical Narrative:    Spoke with Forde Radon, guardian who confirmed patient will be discharging to group home around 3pm.Guardian will be providing transportation to group home. Requested RN CM fax medication list to [phone].   Confirmed medication on Fl2 with EDP-Dr. Mont Dutton and faxed FL2 to group home as requested [phone].  Final next level of care: Group Home Barriers to Discharge: ED Patient Insisting on an Alternate Living Situation/Facility, ED Facility/Family Refusing to Allow Patient to Return, ED Unable to Make Contact with Facility/Family (Unable to make contact w/ legal guardian Forde Radon [phone])   Patient Goals and CMS Choice Patient states their goals for this hospitalization and ongoing recovery are:: Patient does not want to go back to group home.      Discharge Placement                       Discharge Plan and Services In-house Referral: Clinical Social Work                                   Social Determinants of Health (SDOH) Interventions     Readmission Risk Interventions No flowsheet data found.

## 2019-12-16 ENCOUNTER — Encounter: Payer: Self-pay | Admitting: Emergency Medicine

## 2019-12-16 ENCOUNTER — Emergency Department
Admission: EM | Admit: 2019-12-16 | Discharge: 2019-12-17 | Disposition: A | Discharge status: Home / Self Care | Payer: Medicare Other | Attending: Emergency Medicine | Admitting: Emergency Medicine

## 2019-12-16 DIAGNOSIS — F4325 Adjustment disorder with mixed disturbance of emotions and conduct: Secondary | ICD-10-CM | POA: Diagnosis not present

## 2019-12-16 DIAGNOSIS — R45851 Suicidal ideations: Secondary | ICD-10-CM | POA: Insufficient documentation

## 2019-12-16 DIAGNOSIS — R4182 Altered mental status, unspecified: Secondary | ICD-10-CM | POA: Diagnosis not present

## 2019-12-16 DIAGNOSIS — F17210 Nicotine dependence, cigarettes, uncomplicated: Secondary | ICD-10-CM | POA: Insufficient documentation

## 2019-12-16 DIAGNOSIS — R079 Chest pain, unspecified: Secondary | ICD-10-CM | POA: Diagnosis not present

## 2019-12-16 DIAGNOSIS — R441 Visual hallucinations: Secondary | ICD-10-CM | POA: Diagnosis not present

## 2019-12-16 DIAGNOSIS — R4183 Borderline intellectual functioning: Secondary | ICD-10-CM

## 2019-12-16 LAB — COMPREHENSIVE METABOLIC PANEL WITH GFR
ALT: 17 U/L (ref 0–44)
AST: 20 U/L (ref 15–41)
Albumin: 4.7 g/dL (ref 3.5–5.0)
Alkaline Phosphatase: 82 U/L (ref 38–126)
Anion gap: 10 (ref 5–15)
BUN: 13 mg/dL (ref 8–23)
CO2: 26 mmol/L (ref 22–32)
Calcium: 9.4 mg/dL (ref 8.9–10.3)
Chloride: 101 mmol/L (ref 98–111)
Creatinine, Ser: 1.02 mg/dL (ref 0.61–1.24)
GFR calc Af Amer: >60 mL/min
GFR calc non Af Amer: >60 mL/min
Glucose, Bld: 117 mg/dL — ABNORMAL HIGH (ref 70–99)
Potassium: 3.6 mmol/L (ref 3.5–5.1)
Sodium: 137 mmol/L (ref 135–145)
Total Bilirubin: 0.9 mg/dL (ref 0.3–1.2)
Total Protein: 7.8 g/dL (ref 6.5–8.1)

## 2019-12-16 LAB — ACETAMINOPHEN LEVEL: Acetaminophen (Tylenol), Serum: <10 ug/mL — ABNORMAL LOW (ref 10–30)

## 2019-12-16 LAB — CBC
HCT: 35.7 % — ABNORMAL LOW (ref 39.0–52.0)
Hemoglobin: 12.3 g/dL — ABNORMAL LOW (ref 13.0–17.0)
MCH: 31.8 pg (ref 26.0–34.0)
MCHC: 34.5 g/dL (ref 30.0–36.0)
MCV: 92.2 fL (ref 80.0–100.0)
Platelets: 316 10*3/uL (ref 150–400)
RBC: 3.87 MIL/uL — ABNORMAL LOW (ref 4.22–5.81)
RDW: 13.4 % (ref 11.5–15.5)
WBC: 9.4 10*3/uL (ref 4.0–10.5)
nRBC: 0 % (ref 0.0–0.2)

## 2019-12-16 LAB — SALICYLATE LEVEL: Salicylate Lvl: <7 mg/dL — ABNORMAL LOW (ref 7.0–30.0)

## 2019-12-16 LAB — TROPONIN I (HIGH SENSITIVITY): Troponin I (High Sensitivity): 43 ng/L — ABNORMAL HIGH

## 2019-12-16 LAB — ETHANOL: Alcohol, Ethyl (B): <10 mg/dL

## 2019-12-16 NOTE — ED Triage Notes (Addendum)
Pt arrived via EMS from group home. Pt c/o left sided chest pain x5 hours. Per EMS facility reported pt acting abnomal. Pt reports  hallucinations, denies SI and HI. Pt request to speak with psychiatrist. Pt providing minimal information. Pt sts, "I don't try to hurt anybody. I don't want to hurt feelings. I just see things." Pt is calm and cooperative in triage.    Personal Belongings include:   1 red shirt 1 jean 2 black shoes

## 2019-12-17 ENCOUNTER — Encounter: Payer: Self-pay | Admitting: Emergency Medicine

## 2019-12-17 ENCOUNTER — Emergency Department
Admission: EM | Admit: 2019-12-17 | Discharge: 2019-12-21 | Disposition: A | Discharge status: Other Acute Inpatient Hospital | Payer: Medicare Other | Source: Home / Self Care | Attending: Emergency Medicine | Admitting: Emergency Medicine

## 2019-12-17 DIAGNOSIS — F4325 Adjustment disorder with mixed disturbance of emotions and conduct: Secondary | ICD-10-CM

## 2019-12-17 DIAGNOSIS — F17210 Nicotine dependence, cigarettes, uncomplicated: Secondary | ICD-10-CM | POA: Insufficient documentation

## 2019-12-17 DIAGNOSIS — F4320 Adjustment disorder, unspecified: Secondary | ICD-10-CM | POA: Insufficient documentation

## 2019-12-17 DIAGNOSIS — F0391 Unspecified dementia with behavioral disturbance: Secondary | ICD-10-CM | POA: Insufficient documentation

## 2019-12-17 DIAGNOSIS — F919 Conduct disorder, unspecified: Secondary | ICD-10-CM | POA: Insufficient documentation

## 2019-12-17 DIAGNOSIS — R4689 Other symptoms and signs involving appearance and behavior: Secondary | ICD-10-CM

## 2019-12-17 DIAGNOSIS — R258 Other abnormal involuntary movements: Secondary | ICD-10-CM | POA: Insufficient documentation

## 2019-12-17 DIAGNOSIS — Z046 Encounter for general psychiatric examination, requested by authority: Secondary | ICD-10-CM

## 2019-12-17 LAB — CBC
HCT: 35.1 % — ABNORMAL LOW (ref 39.0–52.0)
Hemoglobin: 12.2 g/dL — ABNORMAL LOW (ref 13.0–17.0)
MCH: 31.7 pg (ref 26.0–34.0)
MCHC: 34.8 g/dL (ref 30.0–36.0)
MCV: 91.2 fL (ref 80.0–100.0)
Platelets: 307 10*3/uL (ref 150–400)
RBC: 3.85 MIL/uL — ABNORMAL LOW (ref 4.22–5.81)
RDW: 13.5 % (ref 11.5–15.5)
WBC: 11.2 10*3/uL — ABNORMAL HIGH (ref 4.0–10.5)
nRBC: 0 % (ref 0.0–0.2)

## 2019-12-17 LAB — COMPREHENSIVE METABOLIC PANEL
ALT: 18 U/L (ref 0–44)
AST: 25 U/L (ref 15–41)
Albumin: 4.9 g/dL (ref 3.5–5.0)
Alkaline Phosphatase: 80 U/L (ref 38–126)
Anion gap: 14 (ref 5–15)
BUN: 21 mg/dL (ref 8–23)
CO2: 22 mmol/L (ref 22–32)
Calcium: 9.7 mg/dL (ref 8.9–10.3)
Chloride: 102 mmol/L (ref 98–111)
Creatinine, Ser: 1.38 mg/dL — ABNORMAL HIGH (ref 0.61–1.24)
GFR calc Af Amer: >60 mL/min (ref >60)
GFR calc non Af Amer: 54 mL/min — ABNORMAL LOW (ref >60)
Glucose, Bld: 106 mg/dL — ABNORMAL HIGH (ref 70–99)
Potassium: 4.2 mmol/L (ref 3.5–5.1)
Sodium: 138 mmol/L (ref 135–145)
Total Bilirubin: 1.5 mg/dL — ABNORMAL HIGH (ref 0.3–1.2)
Total Protein: 8 g/dL (ref 6.5–8.1)

## 2019-12-17 LAB — ACETAMINOPHEN LEVEL: Acetaminophen (Tylenol), Serum: <10 ug/mL — ABNORMAL LOW (ref 10–30)

## 2019-12-17 LAB — SALICYLATE LEVEL: Salicylate Lvl: <7 mg/dL — ABNORMAL LOW (ref 7.0–30.0)

## 2019-12-17 LAB — TROPONIN I (HIGH SENSITIVITY): Troponin I (High Sensitivity): 23 ng/L — ABNORMAL HIGH (ref <18)

## 2019-12-17 LAB — ETHANOL: Alcohol, Ethyl (B): <10 mg/dL (ref <10)

## 2019-12-17 MED ORDER — HALOPERIDOL LACTATE 5 MG/ML IJ SOLN
INTRAMUSCULAR | Status: AC
  Filled 2019-12-17: qty 1

## 2019-12-17 MED ORDER — SERTRALINE HCL 50 MG PO TABS: 25.0000 mg | ORAL_TABLET | Freq: Every day | ORAL | Status: DC

## 2019-12-17 MED ORDER — DIPHENHYDRAMINE HCL 50 MG/ML IJ SOLN
INTRAMUSCULAR | Status: AC
  Filled 2019-12-17: qty 1

## 2019-12-17 MED ORDER — LORAZEPAM 2 MG/ML IJ SOLN
INTRAMUSCULAR | Status: AC
  Filled 2019-12-17: qty 1

## 2019-12-17 MED ORDER — HYDROXYZINE PAMOATE 50 MG PO CAPS
50.0000 mg | ORAL_CAPSULE | Freq: Three times a day (TID) | ORAL | Status: DC | PRN
  Filled 2019-12-17: qty 1

## 2019-12-17 MED ORDER — QUETIAPINE FUMARATE 25 MG PO TABS: 50.0000 mg | ORAL_TABLET | Freq: Every day | ORAL | Status: DC

## 2019-12-17 MED ORDER — HYDROXYZINE HCL 25 MG PO TABS: 50.0000 mg | ORAL_TABLET | Freq: Three times a day (TID) | ORAL | Status: DC | PRN

## 2019-12-17 NOTE — ED Notes (Signed)
See note 12/17/19 1106 Paul Vaughan concerning guardian notification.

## 2019-12-17 NOTE — ED Triage Notes (Signed)
Pt arrived via [employer], under IVC. Per affidavit, pt was released from ED today, went back to group home where he stated that he doesn't belong. [employer] found pt standing in traffic. Pt refusing to talk. Pt is calm at this time. Pt unwilling to reply to SI and HI questions. Hx/o manic episodes.

## 2019-12-17 NOTE — BH Assessment (Addendum)
This Clinical research associate received a call back from [place] [facility] [phone] pt's legal guardian's supervisor. This Clinical research associate updated Kylie on pt's plan of care. Kylie also provided this Clinical research associate with contact information for [facility] group home [phone].

## 2019-12-17 NOTE — Consult Note (Signed)
[facility] Psych ED Discharge  12/17/2019 11:50 AM Paul Vaughan  MRN:  [id number] Principal Problem: Adjustment disorder with mixed disturbance of emotions and conduct Discharge Diagnoses: Principal Problem:   Adjustment disorder with mixed disturbance of emotions and conduct Active Problems:   Borderline intellectual disability  Subjective: "I just got anxious."  Patient seen and evaluated in person by this provider.  He reports being anxious, hydroxyzine ordered once and orders in place PRN.  No suicidal/homicidal ideations, hallucinations, mania, substance use, or other psychiatric concerns.  Patient has limited cognitive abilities.  Group home agreeable to have him return.  Dr Lucianne Muss reviewed this client and agreeable with plan to discharge.  Psychiatrically stable.  HPI per [facility] Specialist, Paul Vaughan: Paul Vaughan is an 64 y.o. male presenting to [facility] ED initially voluntary but has since been IVC'd. Per triage note Pt arrived via EMS from group home. Pt c/o left sided chest pain x5 hours. Per EMS facility reported pt acting abnomal. Pt reports hallucinations, denies SI and HI. Pt request to speak with psychiatrist. Pt providing minimal information. Pt sts, "I don't try to hurt anybody. I don't want to hurt feelings. I just see things." Pt is calm and cooperative in triage. During assessment patient presents with a flat affect and thought blocking, patient is alert and oriented x1 he is only oriented to where he is, but is calm and cooperative. When asked if patient knows why he is in the ED he reports "I don't remember." When asked if patient knows his name patient had to look at his wrist band to report his name. Patient had difficulty answering most questions and was unable to provide most information. Patient was able to deny SI/HI/AH, patient was unable to report if he is experiencing VH. Patient does seem to be responding to some internal stimuli as he would have to be redirected when  questions were being asked, patient would get quiet and would stare at times during the assessment. Patient was here at this ED recently from 11/17/19 up until 12/14/19 and presented with similar presentation, patient was then psyc cleared for SW to assist with finding him another group home.   Total Time spent with patient: 45 minutes  Past Psychiatric History: intellectual disabled, depression, anxiety  Past Medical History: History reviewed. No pertinent past medical history. History reviewed. No pertinent surgical history. Family History: History reviewed. No pertinent family history. Family Psychiatric  History: unknown Social History:  Social History   Substance and Sexual Activity  Alcohol Use No     Social History   Substance and Sexual Activity  Drug Use No    Social History   Socioeconomic History  . Marital status: Single    Spouse name: Not on file  . Number of children: Not on file  . Years of education: Not on file  . Highest education level: Not on file  Occupational History  . Not on file  Tobacco Use  . Smoking status: Current Every Day Smoker    Packs/day: 1.00    Types: Cigarettes  . Smokeless tobacco: Never Used  Substance and Sexual Activity  . Alcohol use: No  . Drug use: No  . Sexual activity: Not on file  Other Topics Concern  . Not on file  Social History Narrative  . Not on file   Social Determinants of Health   Financial Resource Strain:   . Difficulty of Paying Living Expenses:   Food Insecurity:   . Worried About [employer] of  Food in the Last Year:   . Ran Out of Food in the Last Year:   Transportation Needs:   . Freight forwarder (Medical):   Marland Kitchen Lack of Transportation (Non-Medical):   Physical Activity:   . Days of Exercise per Week:   . Minutes of Exercise per Session:   Stress:   . Feeling of Stress :   Social Connections:   . Frequency of Communication with Friends and Family:   . Frequency of Social Gatherings with  Friends and Family:   . Attends Religious Services:   . Active Member of Clubs or Organizations:   . Attends Banker Meetings:   Marland Kitchen Marital Status:     Has this patient used any form of tobacco in the last 30 days? (Cigarettes, Smokeless Tobacco, Cigars, and/or Pipes) A prescription for an FDA-approved tobacco cessation medication was offered at discharge and the patient refused  Current Medications: Current Facility-Administered Medications  Medication Dose Route Frequency Provider Last Rate Last Admin  . hydrOXYzine (ATARAX/VISTARIL) tablet 50 mg  50 mg Oral TID PRN Charm Rings, NP      . hydrOXYzine (VISTARIL) capsule 50 mg  50 mg Oral TID PRN Charm Rings, NP      . QUEtiapine (SEROQUEL) tablet 50 mg  50 mg Oral QHS Charm Rings, NP      . sertraline (ZOLOFT) tablet 25 mg  25 mg Oral Daily Charm Rings, NP       Current Outpatient Medications  Medication Sig Dispense Refill  . hydrOXYzine (VISTARIL) 50 MG capsule Take 1 capsule (50 mg total) by mouth 3 (three) times daily as needed for anxiety. 60 capsule 0  . QUEtiapine (SEROQUEL) 25 MG tablet Take 2 tablets (50 mg total) by mouth at bedtime. 30 tablet 0  . sertraline (ZOLOFT) 25 MG tablet Take 1 tablet (25 mg total) by mouth daily. 30 tablet 2   PTA Medications: (Not in a hospital admission)   Musculoskeletal: Strength & Muscle Tone: within normal limits Gait & Station: normal Patient leans: N/A  Psychiatric Specialty Exam: Physical Exam Vitals and nursing note reviewed.  Constitutional:      Appearance: He is well-developed.  HENT:     Head: Normocephalic.  Musculoskeletal:        General: Normal range of motion.     Cervical back: Normal range of motion.  Neurological:     General: No focal deficit present.     Mental Status: He is alert and oriented to person, place, and time.  Psychiatric:        Attention and Perception: Attention and perception normal.        Mood and Affect: Mood is  anxious.        Speech: Speech normal.        Behavior: Behavior normal. Behavior is cooperative.        Thought Content: Thought content normal.        Cognition and Memory: Cognition is impaired.        Judgment: Judgment normal.     Review of Systems  Psychiatric/Behavioral: The patient is nervous/anxious.   All other systems reviewed and are negative.   Blood pressure 140/85, pulse 73, temperature 98.1 F (36.7 C), temperature source Oral, resp. rate 17, SpO2 98 %.There is no height or weight on file to calculate BMI.  General Appearance: Casual  Eye Contact:  Good  Speech:  Normal Rate  Volume:  Normal  Mood:  Anxious  Affect:  Congruent  Thought Process:  Coherent and Descriptions of Associations: Intact  Orientation:  Full (Time, Place, and Person)  Thought Content:  WDL and Logical  Suicidal Thoughts:  No  Homicidal Thoughts:  No  Memory:  Immediate;   Fair Recent;   Fair Remote;   Fair  Judgement:  Fair  Insight:  Fair  Psychomotor Activity:  Normal  Concentration:  Concentration: Fair and Attention Span: Fair  Recall:  [employer] of Knowledge:  Fair  Language:  Good  Akathisia:  No  Handed:  Right  AIMS (if indicated):     Assets:  Housing Leisure Time Physical Health Resilience Social Support  ADL's:  Intact  Cognition:  Impaired,  Mild  Sleep:        Demographic Factors:  Male and Caucasian  Loss Factors: NA  Historical Factors: NA  Risk Reduction Factors:   Sense of responsibility to family, Living with another person, especially a relative, Positive social support and Positive therapeutic relationship  Continued Clinical Symptoms:  Anxiety, mild  Cognitive Features That Contribute To Risk:  None    Suicide Risk:  Minimal: No identifiable suicidal ideation.  Patients presenting with no risk factors but with morbid ruminations; may be classified as minimal risk based on the severity of the depressive symptoms   Follow-up Information     Schedule an appointment as soon as possible for a visit  with [facility]   Contact information: [address] [place] [address] [phone]               Plan Of Care/Follow-up recommendations:  Adjustment disorder with mixed disturbance of emotions and conduct: -Continue Zoloft 25 mg daily  Anxiety: -Continue hydroxyzine 50 mg TID PRN and one once now for anxiety  Insomnia: -Continue Seroquel 25 mg daily at bedtime  Activity:  as tolerated Diet:  heart healthy diet  Disposition: discharge to group home Nanine Means, NP 12/17/2019, 11:50 AM

## 2019-12-17 NOTE — ED Provider Notes (Addendum)
The patient has been evaluated at bedside by NP Shaune Pollack, psychiatry.  Patient is clinically stable.  Not felt to be a danger to self or others.  No SI or Hi.  No indication for inpatient psychiatric admission at this time.  Appropriate for continued outpatient therapy.      Willy Eddy, MD 12/17/19 1120

## 2019-12-17 NOTE — ED Notes (Signed)
Patient unwilling to go into patient room. Patient brought into room by PD.

## 2019-12-17 NOTE — Discharge Instructions (Signed)
Please follow up with PCP and [facility]. 

## 2019-12-17 NOTE — ED Notes (Signed)
Patient sitting calmly in bed. Patient unwilling to speak to this RN.

## 2019-12-17 NOTE — ED Provider Notes (Signed)
[facility] Emergency Department Provider Note  ____________________________________________   First MD Initiated Contact with Patient 12/16/19 2353     (approximate)  I have reviewed the triage vital signs and the nursing notes.   HISTORY  Chief Complaint Mental Health Problem and Chest Pain    HPI Luc Shammas is a 64 y.o. male with below list of previous medical conditions including recent ED psychiatric evaluation return to the emergency department secondary to suicidal ideation and stated visual hallucinations.  Patient denies any homicidal ideation.  Patient denies any auditory hallucinations.       History reviewed. No pertinent past medical history.  Patient Active Problem List   Diagnosis Date Noted  . 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 No pertinent surgical history.  Prior to Admission medications   Medication Sig Start Date End Date Taking? Authorizing Provider  hydrOXYzine (VISTARIL) 50 MG capsule Take 1 capsule (50 mg total) by mouth 3 (three) times daily as needed for anxiety. 04/10/17  Yes Clapacs, Jackquline Denmark, MD  QUEtiapine (SEROQUEL) 25 MG tablet Take 2 tablets (50 mg total) by mouth at bedtime. 11/24/19  Yes Willy Eddy, MD  sertraline (ZOLOFT) 25 MG tablet Take 1 tablet (25 mg total) by mouth daily. 11/24/19 11/23/20 Yes Willy Eddy, MD    Allergies Penicillins and Sulfa antibiotics  History reviewed. No pertinent family history.  Social History Social History   Tobacco Use  . Smoking status: Current Every Day Smoker    Packs/day: 1.00    Types: Cigarettes  . Smokeless tobacco: Never Used  Substance Use Topics  . Alcohol use: No  . Drug use: No    Review of Systems Constitutional: No fever/chills Eyes: No visual changes. ENT: No sore throat. Cardiovascular: Denies chest pain. Respiratory:  Denies shortness of breath. Gastrointestinal: No abdominal pain.  No nausea, no vomiting.  No diarrhea.  No constipation. Genitourinary: Negative for dysuria. Musculoskeletal: Negative for neck pain.  Negative for back pain. Integumentary: Negative for rash. Neurological: Negative for headaches, focal weakness or numbness. Psychiatric: Positive for suicidal ideation and visual hallucinations. ____________________________________________   PHYSICAL EXAM:  VITAL SIGNS: ED Triage Vitals [12/16/19 2251]  Enc Vitals Group     BP 140/85     Pulse Rate 73     Resp 17     Temp 98.1 F (36.7 C)     Temp Source Oral     SpO2 98 %     Weight      Height      Head Circumference      Peak Flow      Pain Score      Pain Loc      Pain Edu?      Excl. in GC?     Constitutional: Alert and oriented.  Eyes: Conjunctivae are normal.  Head: Atraumatic. Mouth/Throat: Patient is wearing a mask. Neck: No stridor.  No meningeal signs.   Cardiovascular: Normal rate, regular rhythm. Good peripheral circulation. Grossly normal heart sounds. Respiratory: Normal respiratory effort.  No retractions. Gastrointestinal: Soft and nontender. No distention.  Musculoskeletal: No lower extremity tenderness nor edema. No gross deformities of extremities. Neurologic:  Normal speech and language. No gross focal neurologic deficits are appreciated.  Skin:  Skin is warm, dry and intact. Psychiatric: Bizarre affect, appears to be responding to internal stimuli.  ____________________________________________   LABS (all labs ordered are listed, but only  abnormal results are displayed)  Labs Reviewed  COMPREHENSIVE METABOLIC PANEL - Abnormal; Notable for the following components:      Result Value   Glucose, Bld 117 (*)    All other components within normal limits  SALICYLATE LEVEL - Abnormal; Notable for the following components:   Salicylate Lvl <7.0 (*)    All other components within normal limits    ACETAMINOPHEN LEVEL - Abnormal; Notable for the following components:   Acetaminophen (Tylenol), Serum <10 (*)    All other components within normal limits  CBC - Abnormal; Notable for the following components:   RBC 3.87 (*)    Hemoglobin 12.3 (*)    HCT 35.7 (*)    All other components within normal limits  TROPONIN I (HIGH SENSITIVITY) - Abnormal; Notable for the following components:   Troponin I (High Sensitivity) 43 (*)    All other components within normal limits  ETHANOL  URINE DRUG SCREEN, QUALITATIVE ([facility] ONLY)  TROPONIN I (HIGH SENSITIVITY)     Procedures   ____________________________________________   INITIAL IMPRESSION / MDM / ASSESSMENT AND PLAN / ED COURSE  As part of my medical decision making, I reviewed the following data within the electronic MEDICAL RECORD NUMBER   64 year old male with above-stated history and physical exam secondary to suicidal ideation and visual hallucinations.  Given stated suicidal ideation patient involuntarily committed.  Awaiting psychiatry consultation and disposition.  ____________________________________________  FINAL CLINICAL IMPRESSION(S) / ED DIAGNOSES  Final diagnoses:  Suicidal ideation  Visual hallucinations     MEDICATIONS GIVEN DURING THIS VISIT:  Medications - No data to display   ED Discharge Orders    None      *Please note:  Nevaeh Casillas was evaluated in Emergency Department on 12/17/2019 for the symptoms described in the history of present illness. He was evaluated in the context of the global COVID-19 pandemic, which necessitated consideration that the patient might be at risk for infection with the SARS-CoV-2 virus that causes COVID-19. Institutional protocols and algorithms that pertain to the evaluation of patients at risk for COVID-19 are in a state of rapid change based on information released by regulatory bodies including the CDC and federal and state organizations. These policies and algorithms  were followed during the patient's care in the ED.  Some ED evaluations and interventions may be delayed as a result of limited staffing during and after the pandemic.*  Note:  This document was prepared using Dragon voice recognition software and may include unintentional dictation errors.   Darci Current, MD 12/17/19 0300

## 2019-12-17 NOTE — ED Notes (Addendum)
Pt refused repeat troponin and IV. Pt demanding his clothes and shoes. Dr Manson Passey notified and gave verbal orders to begin IVC paperwork.

## 2019-12-17 NOTE — ED Provider Notes (Signed)
[facility] Emergency Department Provider Note  ____________________________________________   I have reviewed the triage vital signs and the nursing notes.   HISTORY  Chief Complaint Mental Health Problem   History limited by and level 5 caveat due to: Psychiatric illness, unwilling to answer questions, agitation.   HPI Paul Vaughan is a 64 y.o. male who presents to the emergency department today under IVC because of concerns for abnormal behavior.  Patient was found wandering out in the street. At the time of my exam the patient was awake and alert, however would not answer any of my questions.    Records reviewed. Per medical record review patient has a history of intellectual disability, adjustment disorder, seen at the ED overnight last night.   History reviewed. No pertinent past medical history.  Patient Active Problem List   Diagnosis Date Noted  . 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 No pertinent surgical history.  Prior to Admission medications   Medication Sig Start Date End Date Taking? Authorizing Provider  hydrOXYzine (VISTARIL) 50 MG capsule Take 1 capsule (50 mg total) by mouth 3 (three) times daily as needed for anxiety. 04/10/17   Clapacs, Jackquline Denmark, MD  QUEtiapine (SEROQUEL) 25 MG tablet Take 2 tablets (50 mg total) by mouth at bedtime. 11/24/19   Willy Eddy, MD  sertraline (ZOLOFT) 25 MG tablet Take 1 tablet (25 mg total) by mouth daily. 11/24/19 11/23/20  Willy Eddy, MD    Allergies Penicillins and Sulfa antibiotics  History reviewed. No pertinent family history.  Social History Social History   Tobacco Use  . Smoking status: Current Every Day Smoker    Packs/day: 1.00    Types: Cigarettes  . Smokeless tobacco: Never Used  Substance Use Topics  . Alcohol use: No  . Drug use: No    Review of  Systems Unable to obtain  ____________________________________________   PHYSICAL EXAM: Physical exam limited secondary to patient agitation.  Constitutional: Awake and alert. Eyes: Conjunctivae are normal.  ENT      Head: Normocephalic and atraumatic.      Nose: No congestion/rhinnorhea.      Mouth/Throat: Mucous membranes are moist.      Neck: No stridor. Respiratory: Normal respiratory effort without tachypnea nor retractions.  Musculoskeletal: Normal range of motion in all extremities.  Neurologic: Awake and alert. Moving all extremities Skin:  Skin is warm, dry and intact. No rash noted. Psychiatric: Agitated. Not answering questions. ____________________________________________    LABS (pertinent positives/negatives)  Salicylate, ethanol, acetaminophen below threshold CBC wbc 11.2, hgb 12.2, plt 307 CMP wnl excep glu 106, cr 1.38  ____________________________________________   EKG  None  ____________________________________________    RADIOLOGY  None  ____________________________________________   PROCEDURES  Procedures  ____________________________________________   INITIAL IMPRESSION / ASSESSMENT AND PLAN / ED COURSE  Pertinent labs & imaging results that were available during my care of the patient were reviewed by me and considered in my medical decision making (see chart for details).   Patient presented to the emergency department today because of concern for abnormal behavior. Comes in under IVC. The patient was quite agitated when he was brought back to the room. Did get physical with staff. However once in the room and on the stretcher he did appear to calm down. He would not answer my questions. At this time will continue IVC and have psychiatry evaluate.   ___________________________________________  FINAL CLINICAL IMPRESSION(S) / ED DIAGNOSES  Final diagnoses:  Abnormal behavior  Involuntary commitment     Note: This dictation  was prepared with Dragon dictation. Any transcriptional errors that result from this process are unintentional     Phineas Semen, MD 12/17/19 2252

## 2019-12-17 NOTE — BH Assessment (Signed)
Attempt was made to contact Supervisor of pt's legal guardian [facility] [place] [phone] but no answer. This Clinical research associate left a HIPPA compliant message.

## 2019-12-17 NOTE — BH Assessment (Signed)
Spoke with Doristine Mango [phone] about pt's plan to discharge. [facility] agreed to pick up patient at around 12:30 or 1pm today.

## 2019-12-17 NOTE — BH Assessment (Signed)
Assessment Note  Paul Vaughan is an 64 y.o. male presenting to [facility] ED initially voluntary but has since been IVC'd. Per triage note Pt arrived via EMS from group home. Pt c/o left sided chest pain x5 hours. Per EMS facility reported pt acting abnomal. Pt reports  hallucinations, denies SI and HI. Pt request to speak with psychiatrist. Pt providing minimal information. Pt sts, "I don't try to hurt anybody. I don't want to hurt feelings. I just see things." Pt is calm and cooperative in triage. During assessment patient presents with a flat affect and thought blocking, patient is alert and oriented x1 he is only oriented to where he is, but is calm and cooperative. When asked if patient knows why he is in the ED he reports "I don't remember." When asked if patient knows his name patient had to look at his wrist band to report his name. Patient had difficulty answering most questions and was unable to provide most information. Patient was able to deny SI/HI/AH, patient was unable to report if he is experiencing VH. Patient does seem to be responding to some internal stimuli as he would have to be redirected when questions were being asked, patient would get quiet and would stare at times during the assessment. Patient was here at this ED recently from 11/17/19 up until 12/14/19 and presented with similar presentation, patient was then psyc cleared for SW to assist with finding him another group home.   Collateral information was attempted to be obtained from patient's legal guardian Forde Radon [phone] but voicemail said that she is out of the office until 12/23/19, attempt was made to contact Supervisor [place] Dragon [phone] but no answer.   Per Psyc NP Elenore Paddy patient to be observed overnight and reassessed in the morning.  Diagnosis: Adjustment disorder  Past Medical History: History reviewed. No pertinent past medical history.  History reviewed. No pertinent surgical  history.  Family History: History reviewed. No pertinent family history.  Social History:  reports that he has been smoking cigarettes. He has been smoking about 1.00 pack per day. He has never used smokeless tobacco. He reports that he does not drink alcohol and does not use drugs.  Additional Social History:  Alcohol / Drug Use Pain Medications: See MAR Prescriptions: See MAR Over the Counter: See MAR History of alcohol / drug use?: No history of alcohol / drug abuse  CIWA: CIWA-Ar BP: 140/85 Pulse Rate: 73 COWS:    Allergies:  Allergies  Allergen Reactions  . Penicillins Rash and Swelling    .Has patient had a PCN reaction causing immediate rash, facial/tongue/throat swelling, SOB or lightheadedness with hypotension: Unknown Has patient had a PCN reaction causing severe rash involving mucus membranes or skin necrosis: Unknown Has patient had a PCN reaction that required hospitalization: Unknown Has patient had a PCN reaction occurring within the last 10 years: Unknown If all of the above answers are "NO", then may proceed with Cephalosporin use.   . Sulfa Antibiotics Rash and Swelling    Home Medications: (Not in a hospital admission)   OB/GYN Status:  No LMP for male patient.  General Assessment Data Location of Assessment: [facility] ED TTS Assessment: In system Is this a Tele or Face-to-Face Assessment?: Face-to-Face Is this an Initial Assessment or a Re-assessment for this encounter?: Initial Assessment Patient Accompanied by:: N/A Language Other than English: No Living Arrangements: In Group Home: (Comment: Name of Group Home) [facility]) What gender do you identify as?: Male Marital status:  Single Pregnancy Status: No Living Arrangements: Group Home ([facility]) Can pt return to current living arrangement?: Yes Admission Status: Involuntary Petitioner: ED Attending Is patient capable of signing voluntary admission?: No Referral Source:  Other Insurance type: Medicare A&B  Medical Screening Exam [facility] Walk-in ONLY) Medical Exam completed: Yes  Crisis Care Plan Living Arrangements: Group Home ([facility]) Legal Guardian: Other: Forde Radon [phone]) Name of Psychiatrist: [facility] Name of Therapist: [facility]  Education Status Is patient currently in school?: No Is the patient employed, unemployed or receiving disability?: Unemployed, Receiving disability income  Risk to self with the past 6 months Suicidal Ideation: No Has patient been a risk to self within the past 6 months prior to admission? : No Suicidal Intent: No Has patient had any suicidal intent within the past 6 months prior to admission? : No Is patient at risk for suicide?: No Suicidal Plan?: No Has patient had any suicidal plan within the past 6 months prior to admission? : No Access to Means: No What has been your use of drugs/alcohol within the last 12 months?: None Previous Attempts/Gestures: No How many times?: 0 Other Self Harm Risks: None Triggers for Past Attempts: None known Intentional Self Injurious Behavior: None Family Suicide History: Unknown Recent stressful life event(s): Other (Comment) (None reported) Persecutory voices/beliefs?: No Depression: Yes Depression Symptoms: Isolating, Loss of interest in usual pleasures Substance abuse history and/or treatment for substance abuse?: No Suicide prevention information given to non-admitted patients: Not applicable  Risk to Others within the past 6 months Homicidal Ideation: No Does patient have any lifetime risk of violence toward others beyond the six months prior to admission? : No Thoughts of Harm to Others: No Current Homicidal Intent: No Current Homicidal Plan: No Access to Homicidal Means: No Identified Victim: None History of harm to others?: No Assessment of Violence: None Noted Violent Behavior Description: None Does patient have access to weapons?: No Criminal  Charges Pending?: No Does patient have a court date: No Is patient on probation?: No  Psychosis Hallucinations:  (Unable to report) Delusions: None noted  Mental Status Report Appearance/Hygiene: In scrubs, Unremarkable Eye Contact: Poor Motor Activity: Freedom of movement Speech: Soft, Pressured Level of Consciousness: Alert Mood: Helpless, Depressed Affect: Flat Anxiety Level: None Thought Processes: Thought Blocking Judgement: Impaired Orientation: Place Obsessive Compulsive Thoughts/Behaviors: None  Cognitive Functioning Concentration: Fair Memory: Recent Impaired, Remote Impaired Is patient IDD: Yes Level of Function:  (Unknown) Is IQ score available?: No Insight: Poor Impulse Control: Fair Appetite: Poor Have you had any weight changes? : No Change Sleep: No Change Total Hours of Sleep: 6 Vegetative Symptoms: None  ADLScreening [facility] Assessment Services) Patient's cognitive ability adequate to safely complete daily activities?: Yes Patient able to express need for assistance with ADLs?: Yes Independently performs ADLs?: Yes (appropriate for developmental age)  Prior Inpatient Therapy Prior Inpatient Therapy: No  Prior Outpatient Therapy Prior Outpatient Therapy: Yes Prior Therapy Dates: Current Prior Therapy Facilty/Provider(s): [facility] Reason for Treatment: Mood Disorder Does patient have an ACCT team?: No Does patient have Intensive In-House Services?  : No Does patient have [facility] services? : No Does patient have P4CC services?: No  ADL Screening (condition at time of admission) Patient's cognitive ability adequate to safely complete daily activities?: Yes Is the patient deaf or have difficulty hearing?: No Does the patient have difficulty seeing, even when wearing glasses/contacts?: No Does the patient have difficulty concentrating, remembering, or making decisions?: No Patient able to express need for  assistance with ADLs?: Yes Does the patient have  difficulty dressing or bathing?: No Independently performs ADLs?: Yes (appropriate for developmental age) Does the patient have difficulty walking or climbing stairs?: No Weakness of Legs: None Weakness of Arms/Hands: None  Home Assistive Devices/Equipment Home Assistive Devices/Equipment: None  Therapy Consults (therapy consults require a physician order) PT Evaluation Needed: No OT Evalulation Needed: No SLP Evaluation Needed: No Abuse/Neglect Assessment (Assessment to be complete while patient is alone) Abuse/Neglect Assessment Can Be Completed: Unable to assess, patient is non-responsive or altered mental status Values / Beliefs Cultural Requests During Hospitalization: None Spiritual Requests During Hospitalization: None Consults Spiritual Care Consult Needed: No Transition of Care Team Consult Needed: No            Disposition: Per Psyc NP Elenore Paddy patient to be observed overnight and reassessed in the morning. Disposition Initial Assessment Completed for this Encounter: Yes  On Site Evaluation by:   Reviewed with Physician:    Benay Pike MS LCASA 12/17/2019 3:37 AM

## 2019-12-17 NOTE — ED Notes (Signed)
Attempted to interview this patient. Pt would not make eye contact and would not answer any questions or speak to this nurse other than to say "they said there was nothing wrong with my heart" when asked about chest pain. Pt appeared calm.

## 2019-12-17 NOTE — ED Notes (Signed)
Patient standing at recliner and refuses to have a sit. Patient offered warm blanket and patient refuses.

## 2019-12-18 ENCOUNTER — Emergency Department: Payer: Medicare Other

## 2019-12-18 MED ORDER — TRIHEXYPHENIDYL HCL 2 MG PO TABS
2.0000 mg | ORAL_TABLET | Freq: Every day | ORAL | Status: DC
  Administered 2019-12-19 – 2019-12-20 (×2): 2 mg via ORAL
  Filled 2019-12-18 (×3): qty 1

## 2019-12-18 MED ORDER — NORTRIPTYLINE HCL 10 MG PO CAPS
10.0000 mg | ORAL_CAPSULE | Freq: Every day | ORAL | Status: DC
  Administered 2019-12-19 – 2019-12-20 (×2): 10 mg via ORAL
  Filled 2019-12-18 (×3): qty 1

## 2019-12-18 MED ORDER — THIOTHIXENE 2 MG PO CAPS
2.0000 mg | ORAL_CAPSULE | Freq: Every day | ORAL | Status: DC
  Administered 2019-12-19 – 2019-12-20 (×2): 2 mg via ORAL
  Filled 2019-12-18 (×3): qty 1

## 2019-12-18 NOTE — ED Notes (Signed)
Patient given remote 

## 2019-12-18 NOTE — ED Provider Notes (Addendum)
Emergency Medicine Observation Re-evaluation Note  Paul Vaughan is a 64 y.o. male, seen on rounds today.  Pt initially presented to the ED for complaints of Mental Health Problem Currently, the patient is still under IVC psych may clear him tomorrow  Physical Exam  BP 122/78   Pulse 94   Temp 98.6 F (37 C) (Oral)   Resp 18   SpO2 98%  Physical Exam  Constitutional: Patient resting comfortably in no distress Respiratory: Patient breathing easily in no respiratory distress Psych: Patient not agitated  ED Course / MDM  EKG:    I have reviewed the labs performed to date as well as medications administered while in observation.   Plan  Patient awaiting nursing home placement psych may be on to clear him tomorrow.  We will today get a head CT on him as he is much more nonverbal and he was previously when he had a CT last month.  May be that he had some acute change.   Arnaldo Natal, MD 12/18/19 1626    Arnaldo Natal, MD 12/18/19 [phone]

## 2019-12-18 NOTE — ED Notes (Signed)
Hourly rounding reveals patient sleeping in room. No complaints, stable, in no acute distress. Q15 minute rounds and monitoring via Rover and Officer to continue.  

## 2019-12-18 NOTE — ED Notes (Signed)
Belongings  1 pr black shoes in bag (patient has 2 belongings bags)

## 2019-12-18 NOTE — ED Notes (Signed)
Report to include Situation, Background, Assessment, and Recommendations received from [facility]. Patient alert and oriented, warm and dry, in no acute distress. Patient denies and pain but states he has SI without a plan and hears voices without command and VH of "drawings".. Patient made aware of Q15 minute rounds and Rover and Officer presence for their safety. Patient instructed to come to me with needs or concerns.

## 2019-12-18 NOTE — ED Notes (Signed)
Pt given lunch meal tray. 

## 2019-12-18 NOTE — ED Provider Notes (Signed)
CT repeated for psychiatry shows no acute changes.   Paul Natal, MD 12/18/19 [name]
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
## 2019-12-18 NOTE — ED Notes (Signed)
Pt breakfast tray set at bedside. 
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
## 2019-12-18 NOTE — BH Assessment (Signed)
Assessment Note  Paul Vaughan is an 64 y.o. male presenting to [facility] ED under IVC. Patient was recently in this ED with similar presentation and was psychiatrically cleared yesterday 12/17/19 and discharged back to his group home. Per triage note Pt arrived via [employer], under IVC. Per affidavit, pt was released from ED today, went back to group home where he stated that he doesn't belong. [employer] found pt standing in traffic. Pt refusing to talk. Pt is calm at this time. Pt unwilling to reply to SI and HI questions. Hx/o manic episodes. During assessment patient presents with a flat affect and thought blocking, patient is alert and oriented x2 he is only oriented to where he is and who he is, but is calm and cooperative. When asked if patient knows why he is in the ED he reports "they said I have been having panic attacks." When asked who he is referring to patient is unable to report. Patient denies HI/AH but unable to report SI he reported "I don't know, I wasn't trying to hurt myself today." Patient reports VH "I see images on the floor." Patient was able to report "I don't want to leave because I don't have anywhere to go, I left the group home."   Per Psyc NP Barbara Cower, patient to be observed overnight and reassessed in the morning  Diagnosis: Adjustment Disorder  Past Medical History: History reviewed. No pertinent past medical history.  History reviewed. No pertinent surgical history.  Family History: History reviewed. No pertinent family history.  Social History:  reports that he has been smoking cigarettes. He has been smoking about 1.00 pack per day. He has never used smokeless tobacco. He reports that he does not drink alcohol and does not use drugs.  Additional Social History:  Alcohol / Drug Use Pain Medications: See MAR Prescriptions: See MAR Over the Counter: See MAR History of alcohol / drug use?: No history of alcohol / drug abuse  CIWA: CIWA-Ar BP: 122/78 Pulse Rate: 94 COWS:     Allergies:  Allergies  Allergen Reactions  . Penicillins Rash and Swelling    .Has patient had a PCN reaction causing immediate rash, facial/tongue/throat swelling, SOB or lightheadedness with hypotension: Unknown Has patient had a PCN reaction causing severe rash involving mucus membranes or skin necrosis: Unknown Has patient had a PCN reaction that required hospitalization: Unknown Has patient had a PCN reaction occurring within the last 10 years: Unknown If all of the above answers are "NO", then may proceed with Cephalosporin use.   . Sulfa Antibiotics Rash and Swelling    Home Medications: (Not in a hospital admission)   OB/GYN Status:  No LMP for male patient.  General Assessment Data Location of Assessment: [facility] ED TTS Assessment: In system Is this a Tele or Face-to-Face Assessment?: Face-to-Face Is this an Initial Assessment or a Re-assessment for this encounter?: Initial Assessment Patient Accompanied by:: N/A Language Other than English: No Living Arrangements: In Group Home: (Comment: Name of Group Home) What gender do you identify as?: Male Marital status: Married Pregnancy Status: No Living Arrangements: Group Home Can pt return to current living arrangement?: Yes Admission Status: Involuntary Petitioner: Other Is patient capable of signing voluntary admission?: No Referral Source: Other Insurance type: Medicare A&B  Medical Screening Exam [facility] Walk-in ONLY) Medical Exam completed: Yes  Crisis Care Plan Living Arrangements: Group Home Legal Guardian: Other: Forde Radon [phone]) Name of Psychiatrist: [facility] Name of Therapist: [facility]  Education Status Is patient currently in school?: No Is  the patient employed, unemployed or receiving disability?: Unemployed, Receiving disability income  Risk to self with the past 6 months Suicidal Ideation:  (UTA) Has patient been a risk to self within the past 6 months prior to admission? :  (UTA) Suicidal  Intent:  (UTA) Has patient had any suicidal intent within the past 6 months prior to admission? : Other (comment) (UTA) Is patient at risk for suicide?:  (UTA) Suicidal Plan?:  (UTA) Has patient had any suicidal plan within the past 6 months prior to admission? :  (UTA) Access to Means: No What has been your use of drugs/alcohol within the last 12 months?: None Previous Attempts/Gestures: No How many times?: 0 Other Self Harm Risks: None Triggers for Past Attempts: None known Intentional Self Injurious Behavior: None Family Suicide History: Unknown Recent stressful life event(s): Other (Comment) (Recent group home change) Persecutory voices/beliefs?: No Depression: Yes Depression Symptoms: Isolating, Loss of interest in usual pleasures Substance abuse history and/or treatment for substance abuse?: No Suicide prevention information given to non-admitted patients: Not applicable  Risk to Others within the past 6 months Homicidal Ideation: No Does patient have any lifetime risk of violence toward others beyond the six months prior to admission? : No Thoughts of Harm to Others: No Current Homicidal Intent: No Current Homicidal Plan: No Access to Homicidal Means: No Identified Victim: None History of harm to others?: No Assessment of Violence: None Noted Violent Behavior Description: None Does patient have access to weapons?: No Criminal Charges Pending?: No Does patient have a court date: No Is patient on probation?: No  Psychosis Hallucinations: Visual (Reports "images on the floor") Delusions: None noted  Mental Status Report Appearance/Hygiene: In scrubs, Unremarkable Eye Contact: Poor Motor Activity: Freedom of movement, Agitation, Restlessness Speech: Soft, Pressured Level of Consciousness: Alert Mood: Depressed, Helpless Affect: Flat Anxiety Level: None Thought Processes: Thought Blocking Judgement: Partial Orientation: Person, Place Obsessive Compulsive  Thoughts/Behaviors: None  Cognitive Functioning Concentration: Fair Memory: Recent Intact, Recent Impaired Is patient IDD: Yes Level of Function:  (Unknown) Is IQ score available?: No Insight: Poor Impulse Control: Poor Appetite: Fair Have you had any weight changes? : No Change Sleep: No Change Total Hours of Sleep: 6 Vegetative Symptoms: None  ADLScreening [facility] Assessment Services) Patient's cognitive ability adequate to safely complete daily activities?: Yes Patient able to express need for assistance with ADLs?: Yes Independently performs ADLs?: Yes (appropriate for developmental age)  Prior Inpatient Therapy Prior Inpatient Therapy: No  Prior Outpatient Therapy Prior Outpatient Therapy: Yes Prior Therapy Dates: Current Prior Therapy Facilty/Provider(s): [facility] Reason for Treatment: Mood Disorder Does patient have an ACCT team?: No Does patient have Intensive In-House Services?  : No Does patient have [facility] services? : No Does patient have P4CC services?: No  ADL Screening (condition at time of admission) Patient's cognitive ability adequate to safely complete daily activities?: Yes Is the patient deaf or have difficulty hearing?: No Does the patient have difficulty seeing, even when wearing glasses/contacts?: No Does the patient have difficulty concentrating, remembering, or making decisions?: No Patient able to express need for assistance with ADLs?: Yes Does the patient have difficulty dressing or bathing?: No Independently performs ADLs?: Yes (appropriate for developmental age) Does the patient have difficulty walking or climbing stairs?: No Weakness of Legs: None Weakness of Arms/Hands: None  Home Assistive Devices/Equipment Home Assistive Devices/Equipment: None  Therapy Consults (therapy consults require a physician order) PT Evaluation Needed: No OT Evalulation Needed: No SLP Evaluation Needed: No Abuse/Neglect Assessment (Assessment to be  complete  while patient is alone) Abuse/Neglect Assessment Can Be Completed: Unable to assess, patient is non-responsive or altered mental status Values / Beliefs Cultural Requests During Hospitalization: None Spiritual Requests During Hospitalization: None Consults Spiritual Care Consult Needed: No Transition of Care Team Consult Needed: No            Disposition: Per Psyc NP Barbara Cower, patient to be observed overnight and reassessed in the morning Disposition Initial Assessment Completed for this Encounter: Yes  On Site Evaluation by:   Reviewed with Physician:    Benay Pike MS LCASA 12/18/2019 2:49 AM

## 2019-12-18 NOTE — ED Notes (Signed)
Patient went to CT

## 2019-12-18 NOTE — ED Provider Notes (Signed)
-----------------------------------------   3:43 AM on 12/18/2019 -----------------------------------------  Patient evaluated by psychiatry team who will reassess in the morning.  Remains in the ED under IVC pending psychiatric disposition.   Irean Hong, MD 12/18/19 [phone]

## 2019-12-18 NOTE — BH Assessment (Signed)
Late Entry- Writer attempted to speak with the patient to complete an updated/reassessment. Patient didn't answer questions and only stared at Clinical research associate. Writer made several attempts to engage with the patient was unsuccessful.

## 2019-12-18 NOTE — ED Notes (Signed)
Hourly rounding reveals patient asleep in room. No complaints, stable, in no acute distress. Q15 minute rounds and monitoring via Rover and Officer to continue.  

## 2019-12-19 MED ORDER — LIDOCAINE 5 % EX PTCH
1.0000 | MEDICATED_PATCH | CUTANEOUS | Status: DC
  Administered 2019-12-20: 1 via TRANSDERMAL
  Filled 2019-12-19 (×4): qty 1

## 2019-12-19 MED ORDER — ACETAMINOPHEN 500 MG PO TABS
1000.0000 mg | ORAL_TABLET | Freq: Once | ORAL | Status: AC
  Administered 2019-12-19: 1000 mg via ORAL
  Filled 2019-12-19: qty 2

## 2019-12-19 NOTE — ED Notes (Signed)
Breakfast tray given. °

## 2019-12-19 NOTE — ED Notes (Signed)
VS checked and shower has been offered. Pt has refused shower today. No other needs found a this moment.

## 2019-12-19 NOTE — ED Notes (Signed)
Hourly rounding reveals patient asleep in room. No complaints, stable, in no acute distress. Q15 minute rounds and monitoring via Security Cameras to continue. 

## 2019-12-19 NOTE — ED Notes (Signed)
Hourly rounding reveals patient sleeping in room. No complaints, stable, in no acute distress. Q15 minute rounds and monitoring via Security Cameras to continue. 

## 2019-12-19 NOTE — ED Notes (Signed)
Pt brought into ED BHU via sally port and wand with metal detector for safety by [place] Security officer. Patient oriented to unit/care area: Pt informed of unit policies and procedures.  Informed that, for their safety, care areas are designed for safety and monitored by security cameras at all times. Patient verbalizes understanding, and verbal contract for safety obtained.Pt shown to their room.  

## 2019-12-19 NOTE — ED Notes (Signed)
lunch tray given. 

## 2019-12-19 NOTE — BH Assessment (Signed)
Spoke with Christen Bame the on call APS worker to update her about patient's acceptance at [facility] and the current transportation barriers. Ronnie requested that staff keep them updated on patient's plan of care and left [phone] as a good call back number.

## 2019-12-19 NOTE — BH Assessment (Addendum)
Attempt was made to contact Supervisor of pt's legal guardian Forde Radon, [facility] Linard Millers [phone]) however there was no answer. This Clinical research associate left a HIPPA compliant message.  6:15 pm Writer left contact information with [facility] on call services [phone])

## 2019-12-19 NOTE — ED Notes (Signed)
Hourly rounding reveals patient awake in room. No complaints, stable, in no acute distress. Q15 minute rounds and monitoring via Security Cameras to continue. 

## 2019-12-19 NOTE — BH Assessment (Signed)
Referral information for Psychiatric Hospitalization faxed to;    [facility] [phone] -or- [phone]) [phone]   [facility] [phone] -or- [phone]),    [facility] [phone] or [phone])   [facility] [phone] or [phone]),    [facility] [phone]).

## 2019-12-19 NOTE — ED Notes (Signed)
IVC, accepted to [facility], C COM called for transport

## 2019-12-19 NOTE — BH Assessment (Signed)
Writer spoke with [facility] [phone]), informed them patient is unable transport today but will be able to tomorrow (12/20/2019) due to a lack of transportation from [facility].

## 2019-12-19 NOTE — BH Assessment (Signed)
Patient has been accepted to [facility].  Patient assigned to room 703 Accepting physician is Dr. Fabio Neighbors.  Call report to [phone].  Representative was Jonny Ruiz.   ER Staff is aware of it:  Lynden Ang, ER Secretary  Dr. Dolores Frame, ER MD  Thurston Hole, Patient's Nurse

## 2019-12-19 NOTE — ED Provider Notes (Signed)
Emergency Medicine Observation Re-evaluation Note  Paul Vaughan is a 64 y.o. male, seen on rounds today.  Pt initially presented to the ED for complaints of Mental Health Problem Currently, the patient is resting comfortably. Denies any acute needs at this time.  Physical Exam  BP 122/78   Pulse 94   Temp 98.6 F (37 C) (Oral)   Resp 18   SpO2 98%  Physical Exam Vitals and nursing note reviewed.  HENT:     Head: Normocephalic and atraumatic.     Right Ear: External ear normal.     Left Ear: External ear normal.  Cardiovascular:     Pulses: Normal pulses.  Pulmonary:     Effort: No respiratory distress.  Skin:    Capillary Refill: Capillary refill takes less than 2 seconds.     ED Course / MDM  EKG:    I have reviewed the labs performed to date as well as medications administered while in observation.  Recent changes in the last 24 hours include none. Plan  Current plan is for awaiting transport. Accepted at OSH. Patient is under full IVC at this time.   Gilles Chiquito, MD 12/19/19 1006

## 2019-12-19 NOTE — BH Assessment (Signed)
Writer spoke with [facility] [phone]), informed the patient is unable transport today bu tomorrow (12/20/2019) due to no transportation from [place].
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
## 2019-12-19 NOTE — ED Notes (Signed)
Report to include Situation, Background, Assessment, and Recommendations received from [name] RN. Patient alert and oriented, warm and dry, in no acute distress. Patient denies SI, HI, AVH and pain. Patient made aware of Q15 minute rounds and security cameras for their safety. Patient instructed to come to me with needs or concerns. 

## 2019-12-19 NOTE — ED Notes (Signed)
Hourly rounding reveals patient sleeping in room. No complaints, stable, in no acute distress. Q15 minute rounds and monitoring via Rover and Officer to continue.  

## 2019-12-20 MED ORDER — ACETAMINOPHEN 325 MG PO TABS
650.0000 mg | ORAL_TABLET | Freq: Four times a day (QID) | ORAL | Status: DC | PRN
  Administered 2019-12-20: 650 mg via ORAL
  Filled 2019-12-20: qty 2

## 2019-12-20 NOTE — ED Notes (Signed)
Hourly rounding reveals patient in rest room. No complaints, stable, in no acute distress. Q15 minute rounds and monitoring via Security Cameras to continue. 

## 2019-12-20 NOTE — ED Notes (Signed)
Pt asked x2 if he would like to shower - he has declined

## 2019-12-20 NOTE — ED Notes (Signed)
Hourly rounding reveals patient in room. No complaints, stable, in no acute distress. Q15 minute rounds and monitoring via Security Cameras to continue.  Dinner tray provided. 

## 2019-12-20 NOTE — ED Notes (Signed)
Hourly rounding reveals patient in room. Remote provided; pt requested Tylenol and a Lidocaine patch at dinner time, for his chronic low back pain.  No other complaints, stable, in no acute distress. Q15 minute rounds and monitoring via [employer] to continue.

## 2019-12-20 NOTE — ED Notes (Signed)
Hourly rounding reveals patient sleeping in room. No complaints, stable, in no acute distress. Q15 minute rounds and monitoring via Security Cameras to continue. 

## 2019-12-20 NOTE — ED Notes (Signed)
Hourly rounding reveals patient in room. No complaints, stable, in no acute distress. Q15 minute rounds and monitoring via [employer] to continue.  Graham crackers provided per pt request.

## 2019-12-20 NOTE — ED Provider Notes (Signed)
Emergency Medicine Observation Re-evaluation Note  Paul Vaughan is a 64 y.o. male, seen on rounds today.  Pt initially presented to the ED for complaints of Mental Health Problem Currently, the patient is resting in bed, no acute concerns.  Advised the patient of his acceptance to [facility] facility awaiting transport tomorrow..  Physical Exam  BP 119/67   Pulse 77   Temp 98.2 F (36.8 C) (Oral)   Resp 17   SpO2 98%  Physical Exam  General: Awake alert, no acute distress. Cardiovascular regular rate and rhythm around 70 bpm.  No obvious murmur. Respiratory: Clear lung sounds bilaterally, no wheeze rales or rhonchi. Abdominal: Nontender abdomen.  ED Course / MDM  No new lab work.  I reviewed the patient's old her lab work which is largely nonrevealing.  CT scan head was negative. Plan  Current plan is for patient has been accepted to [facility] facility.  We did not have transport available today unfortunately and patient will be transported tomorrow.. Patient is under full IVC at this time.   Minna Antis, MD 12/20/19 1105

## 2019-12-20 NOTE — BH Assessment (Signed)
Placement update: [facility]  Cat contacted TTS to confirm pt's transportation for today. Cat was made aware of the lack if transportation for today and agreed to hold pt's bed assignment for TOMORROW ONLY.   PATIENT IS SCHEDULED FOR ADMISSION ANYTIME AFTER 9AM TOMORROW

## 2019-12-20 NOTE — ED Notes (Addendum)
Clean pants provided  - he continues to decline a shower today

## 2019-12-21 NOTE — ED Notes (Signed)
Pt up to nursing station and is noted to have a scratch to the left side of his face. Pt unsure of how this happened. Area cleansed with soap and water and gauze applied to area.

## 2019-12-21 NOTE — ED Notes (Signed)
Patient voices understanding of discharge/transfer to another facility, He is without behavioral issues, or signs of distress, Kathryne Sharper is here to transport.

## 2019-12-21 NOTE — ED Provider Notes (Signed)
Emergency Medicine Observation Re-evaluation Note  Paul Vaughan is a 64 y.o. male, seen on rounds today.  Pt initially presented to the ED for complaints of Mental Health Problem   Physical Exam  BP 135/67   Pulse 71   Temp 97.8 F (36.6 C)   Resp 15   SpO2 98%  Physical Exam Constitutional patient sleeping comfortably in bed Respiratory: No respiratory distress Psych: Patient is not agitated ED Course / MDM  EKG:    I have reviewed the labs performed to date as well as medications administered while in observation.  Plan  Patient is awaiting placement   Arnaldo Natal, MD 12/21/19 [phone]

## 2020-01-26 ENCOUNTER — Ambulatory Visit (LOCAL_COMMUNITY_HEALTH_CENTER): Payer: Self-pay

## 2020-01-26 ENCOUNTER — Other Ambulatory Visit: Payer: Self-pay

## 2020-01-26 DIAGNOSIS — Z111 Encounter for screening for respiratory tuberculosis: Secondary | ICD-10-CM

## 2020-01-29 ENCOUNTER — Ambulatory Visit (LOCAL_COMMUNITY_HEALTH_CENTER): Payer: Medicare Other

## 2020-01-29 ENCOUNTER — Other Ambulatory Visit: Payer: Self-pay

## 2020-01-29 DIAGNOSIS — Z111 Encounter for screening for respiratory tuberculosis: Secondary | ICD-10-CM

## 2020-01-29 LAB — TB SKIN TEST
Induration: 0 mm
TB Skin Test: NEGATIVE

## 2020-01-29 NOTE — Progress Notes (Signed)
Client accompanied by care provider during visit and she was given paper copy of client's TB skin test result. Jossie Ng, RN

## 2021-06-08 DIAGNOSIS — R809 Proteinuria, unspecified: Secondary | ICD-10-CM | POA: Insufficient documentation

## 2021-06-08 DIAGNOSIS — E785 Hyperlipidemia, unspecified: Secondary | ICD-10-CM | POA: Insufficient documentation

## 2021-06-08 DIAGNOSIS — J449 Chronic obstructive pulmonary disease, unspecified: Secondary | ICD-10-CM | POA: Diagnosis present

## 2021-06-08 DIAGNOSIS — F32A Depression, unspecified: Secondary | ICD-10-CM | POA: Insufficient documentation

## 2021-06-08 DIAGNOSIS — I10 Essential (primary) hypertension: Secondary | ICD-10-CM | POA: Diagnosis present

## 2021-06-08 DIAGNOSIS — F03918 Unspecified dementia, unspecified severity, with other behavioral disturbance: Secondary | ICD-10-CM | POA: Diagnosis present

## 2021-06-08 DIAGNOSIS — I509 Heart failure, unspecified: Secondary | ICD-10-CM

## 2021-06-09 ENCOUNTER — Other Ambulatory Visit: Payer: Self-pay | Admitting: Nephrology

## 2021-06-09 DIAGNOSIS — N281 Cyst of kidney, acquired: Secondary | ICD-10-CM

## 2021-06-09 DIAGNOSIS — N1831 Chronic kidney disease, stage 3a: Secondary | ICD-10-CM

## 2021-06-09 DIAGNOSIS — R809 Proteinuria, unspecified: Secondary | ICD-10-CM

## 2021-06-09 DIAGNOSIS — E785 Hyperlipidemia, unspecified: Secondary | ICD-10-CM

## 2021-06-09 DIAGNOSIS — R82998 Other abnormal findings in urine: Secondary | ICD-10-CM

## 2021-06-19 ENCOUNTER — Other Ambulatory Visit: Payer: Self-pay

## 2021-06-19 ENCOUNTER — Ambulatory Visit
Admission: RE | Admit: 2021-06-19 | Discharge: 2021-06-19 | Disposition: A | Discharge status: Home / Self Care | Payer: Medicare Other | Source: Ambulatory Visit | Attending: Nephrology | Admitting: Nephrology

## 2021-06-19 DIAGNOSIS — N281 Cyst of kidney, acquired: Secondary | ICD-10-CM | POA: Diagnosis present

## 2021-06-19 DIAGNOSIS — N1831 Chronic kidney disease, stage 3a: Secondary | ICD-10-CM

## 2021-06-19 DIAGNOSIS — R809 Proteinuria, unspecified: Secondary | ICD-10-CM | POA: Diagnosis present

## 2021-06-19 DIAGNOSIS — E785 Hyperlipidemia, unspecified: Secondary | ICD-10-CM | POA: Diagnosis present

## 2021-06-19 DIAGNOSIS — R82998 Other abnormal findings in urine: Secondary | ICD-10-CM | POA: Diagnosis present

## 2021-06-30 ENCOUNTER — Emergency Department: Payer: Medicare Other

## 2021-06-30 ENCOUNTER — Encounter: Payer: Self-pay | Admitting: Emergency Medicine

## 2021-06-30 ENCOUNTER — Inpatient Hospital Stay
Admission: EM | Admit: 2021-06-30 | Discharge: 2021-07-06 | DRG: 871 | Disposition: A | Discharge status: Home / Self Care | Payer: Medicare Other | Attending: Internal Medicine | Admitting: Internal Medicine

## 2021-06-30 ENCOUNTER — Other Ambulatory Visit: Payer: Self-pay

## 2021-06-30 DIAGNOSIS — G9341 Metabolic encephalopathy: Secondary | ICD-10-CM | POA: Diagnosis present

## 2021-06-30 DIAGNOSIS — F4325 Adjustment disorder with mixed disturbance of emotions and conduct: Secondary | ICD-10-CM | POA: Diagnosis present

## 2021-06-30 DIAGNOSIS — D696 Thrombocytopenia, unspecified: Secondary | ICD-10-CM | POA: Diagnosis present

## 2021-06-30 DIAGNOSIS — Z28310 Unvaccinated for covid-19: Secondary | ICD-10-CM | POA: Diagnosis not present

## 2021-06-30 DIAGNOSIS — N189 Chronic kidney disease, unspecified: Secondary | ICD-10-CM | POA: Diagnosis not present

## 2021-06-30 DIAGNOSIS — Z882 Allergy status to sulfonamides status: Secondary | ICD-10-CM

## 2021-06-30 DIAGNOSIS — N1831 Chronic kidney disease, stage 3a: Secondary | ICD-10-CM | POA: Diagnosis present

## 2021-06-30 DIAGNOSIS — D649 Anemia, unspecified: Secondary | ICD-10-CM | POA: Diagnosis not present

## 2021-06-30 DIAGNOSIS — F0390 Unspecified dementia without behavioral disturbance: Secondary | ICD-10-CM | POA: Diagnosis present

## 2021-06-30 DIAGNOSIS — A4189 Other specified sepsis: Principal | ICD-10-CM | POA: Diagnosis present

## 2021-06-30 DIAGNOSIS — N179 Acute kidney failure, unspecified: Secondary | ICD-10-CM | POA: Diagnosis present

## 2021-06-30 DIAGNOSIS — Z880 Allergy status to penicillin: Secondary | ICD-10-CM | POA: Diagnosis not present

## 2021-06-30 DIAGNOSIS — A419 Sepsis, unspecified organism: Secondary | ICD-10-CM | POA: Diagnosis not present

## 2021-06-30 DIAGNOSIS — Z79899 Other long term (current) drug therapy: Secondary | ICD-10-CM | POA: Diagnosis not present

## 2021-06-30 DIAGNOSIS — J9601 Acute respiratory failure with hypoxia: Secondary | ICD-10-CM | POA: Diagnosis present

## 2021-06-30 DIAGNOSIS — R4183 Borderline intellectual functioning: Secondary | ICD-10-CM

## 2021-06-30 DIAGNOSIS — N183 Chronic kidney disease, stage 3 unspecified: Secondary | ICD-10-CM

## 2021-06-30 DIAGNOSIS — D638 Anemia in other chronic diseases classified elsewhere: Secondary | ICD-10-CM | POA: Diagnosis not present

## 2021-06-30 DIAGNOSIS — F17210 Nicotine dependence, cigarettes, uncomplicated: Secondary | ICD-10-CM | POA: Diagnosis present

## 2021-06-30 DIAGNOSIS — I129 Hypertensive chronic kidney disease with stage 1 through stage 4 chronic kidney disease, or unspecified chronic kidney disease: Secondary | ICD-10-CM | POA: Diagnosis present

## 2021-06-30 DIAGNOSIS — U071 COVID-19: Secondary | ICD-10-CM | POA: Diagnosis present

## 2021-06-30 DIAGNOSIS — G9349 Other encephalopathy: Secondary | ICD-10-CM

## 2021-06-30 DIAGNOSIS — J1282 Pneumonia due to coronavirus disease 2019: Secondary | ICD-10-CM | POA: Diagnosis present

## 2021-06-30 DIAGNOSIS — D631 Anemia in chronic kidney disease: Secondary | ICD-10-CM | POA: Diagnosis present

## 2021-06-30 DIAGNOSIS — R531 Weakness: Secondary | ICD-10-CM

## 2021-06-30 DIAGNOSIS — R410 Disorientation, unspecified: Secondary | ICD-10-CM

## 2021-06-30 DIAGNOSIS — R6520 Severe sepsis without septic shock: Secondary | ICD-10-CM | POA: Diagnosis present

## 2021-06-30 DIAGNOSIS — J189 Pneumonia, unspecified organism: Secondary | ICD-10-CM

## 2021-06-30 LAB — COMPREHENSIVE METABOLIC PANEL
ALT: 35 U/L (ref 0–44)
AST: 74 U/L — ABNORMAL HIGH (ref 15–41)
Albumin: 3.8 g/dL (ref 3.5–5.0)
Alkaline Phosphatase: 42 U/L (ref 38–126)
Anion gap: 11 (ref 5–15)
BUN: 36 mg/dL — ABNORMAL HIGH (ref 8–23)
CO2: 21 mmol/L — ABNORMAL LOW (ref 22–32)
Calcium: 9.4 mg/dL (ref 8.9–10.3)
Chloride: 101 mmol/L (ref 98–111)
Creatinine, Ser: 1.95 mg/dL — ABNORMAL HIGH (ref 0.61–1.24)
GFR, Estimated: 37 mL/min — ABNORMAL LOW (ref >60)
Glucose, Bld: 96 mg/dL (ref 70–99)
Potassium: 4.6 mmol/L (ref 3.5–5.1)
Sodium: 133 mmol/L — ABNORMAL LOW (ref 135–145)
Total Bilirubin: 0.8 mg/dL (ref 0.3–1.2)
Total Protein: 7.8 g/dL (ref 6.5–8.1)

## 2021-06-30 LAB — PROTIME-INR
INR: 1.1 (ref 0.8–1.2)
Prothrombin Time: 13.8 seconds (ref 11.4–15.2)

## 2021-06-30 LAB — CBC WITH DIFFERENTIAL/PLATELET
Abs Immature Granulocytes: 0.09 10*3/uL — ABNORMAL HIGH (ref 0.00–0.07)
Basophils Absolute: 0.1 10*3/uL (ref 0.0–0.1)
Basophils Relative: 1 %
Eosinophils Absolute: 0 10*3/uL (ref 0.0–0.5)
Eosinophils Relative: 0 %
HCT: 36.6 % — ABNORMAL LOW (ref 39.0–52.0)
Hemoglobin: 12.1 g/dL — ABNORMAL LOW (ref 13.0–17.0)
Immature Granulocytes: 1 %
Lymphocytes Relative: 17 %
Lymphs Abs: 1.7 10*3/uL (ref 0.7–4.0)
MCH: 32.2 pg (ref 26.0–34.0)
MCHC: 33.1 g/dL (ref 30.0–36.0)
MCV: 97.3 fL (ref 80.0–100.0)
Monocytes Absolute: 1.3 10*3/uL — ABNORMAL HIGH (ref 0.1–1.0)
Monocytes Relative: 13 %
Neutro Abs: 6.8 10*3/uL (ref 1.7–7.7)
Neutrophils Relative %: 68 %
Platelets: 162 10*3/uL (ref 150–400)
RBC: 3.76 MIL/uL — ABNORMAL LOW (ref 4.22–5.81)
RDW: 13.7 % (ref 11.5–15.5)
WBC: 10 10*3/uL (ref 4.0–10.5)
nRBC: 0 % (ref 0.0–0.2)

## 2021-06-30 LAB — RESP PANEL BY RT-PCR (FLU A&B, COVID) ARPGX2
Influenza A by PCR: NEGATIVE
Influenza B by PCR: NEGATIVE
SARS Coronavirus 2 by RT PCR: POSITIVE — AB

## 2021-06-30 LAB — D-DIMER, QUANTITATIVE: D-Dimer, Quant: 1.78 ug/mL-FEU — ABNORMAL HIGH (ref 0.00–0.50)

## 2021-06-30 LAB — URINALYSIS, COMPLETE (UACMP) WITH MICROSCOPIC
Bacteria, UA: NONE SEEN
Bilirubin Urine: NEGATIVE
Glucose, UA: NEGATIVE mg/dL
Ketones, ur: 5 mg/dL — AB
Leukocytes,Ua: NEGATIVE
Nitrite: NEGATIVE
Protein, ur: 30 mg/dL — AB
Specific Gravity, Urine: 1.028 (ref 1.005–1.030)
Squamous Epithelial / HPF: NONE SEEN (ref 0–5)
pH: 6 (ref 5.0–8.0)

## 2021-06-30 LAB — LACTIC ACID, PLASMA
Lactic Acid, Venous: 2.4 mmol/L (ref 0.5–1.9)
Lactic Acid, Venous: 1.6 mmol/L (ref 0.5–1.9)

## 2021-06-30 LAB — PROCALCITONIN: Procalcitonin: 0.46 ng/mL

## 2021-06-30 LAB — APTT: aPTT: 37 seconds — ABNORMAL HIGH (ref 24–36)

## 2021-06-30 MED ORDER — ASPIRIN EC 81 MG PO TBEC
81.0000 mg | DELAYED_RELEASE_TABLET | Freq: Every day | ORAL | Status: DC
  Administered 2021-06-30 – 2021-07-02 (×3): 81 mg via ORAL
  Filled 2021-06-30 (×3): qty 1

## 2021-06-30 MED ORDER — MEMANTINE HCL 5 MG PO TABS
5.0000 mg | ORAL_TABLET | Freq: Every day | ORAL | Status: DC
  Administered 2021-06-30 – 2021-07-05 (×6): 5 mg via ORAL
  Filled 2021-06-30 (×6): qty 1

## 2021-06-30 MED ORDER — LEVOFLOXACIN IN D5W 750 MG/150ML IV SOLN: 750.0000 mg | INTRAVENOUS | Status: DC

## 2021-06-30 MED ORDER — ACETAMINOPHEN 650 MG RE SUPP: 650.0000 mg | Freq: Four times a day (QID) | RECTAL | Status: DC | PRN

## 2021-06-30 MED ORDER — METRONIDAZOLE 500 MG/100ML IV SOLN
500.0000 mg | Freq: Once | INTRAVENOUS | Status: AC
  Administered 2021-06-30: 500 mg via INTRAVENOUS
  Filled 2021-06-30: qty 100

## 2021-06-30 MED ORDER — VANCOMYCIN HCL 1500 MG/300ML IV SOLN
1500.0000 mg | Freq: Once | INTRAVENOUS | Status: AC
  Administered 2021-06-30: 1500 mg via INTRAVENOUS
  Filled 2021-06-30: qty 300

## 2021-06-30 MED ORDER — GABAPENTIN 300 MG PO CAPS
300.0000 mg | ORAL_CAPSULE | Freq: Every day | ORAL | Status: DC
  Administered 2021-06-30 – 2021-07-05 (×6): 300 mg via ORAL
  Filled 2021-06-30 (×6): qty 1

## 2021-06-30 MED ORDER — NIRMATRELVIR/RITONAVIR (PAXLOVID) TABLET (RENAL DOSING)
2.0000 | ORAL_TABLET | Freq: Two times a day (BID) | ORAL | Status: DC
  Administered 2021-06-30 – 2021-07-02 (×5): 2 via ORAL
  Filled 2021-06-30 (×4): qty 20

## 2021-06-30 MED ORDER — GABAPENTIN 100 MG PO CAPS
100.0000 mg | ORAL_CAPSULE | Freq: Two times a day (BID) | ORAL | Status: DC
  Administered 2021-06-30 – 2021-07-06 (×12): 100 mg via ORAL
  Filled 2021-06-30 (×12): qty 1

## 2021-06-30 MED ORDER — SODIUM CHLORIDE 0.9 % IV SOLN
2.0000 g | Freq: Once | INTRAVENOUS | Status: AC
  Administered 2021-06-30: 2 g via INTRAVENOUS
  Filled 2021-06-30: qty 2

## 2021-06-30 MED ORDER — GUAIFENESIN-DM 100-10 MG/5ML PO SYRP: 10.0000 mL | ORAL_SOLUTION | ORAL | Status: DC | PRN

## 2021-06-30 MED ORDER — ESCITALOPRAM OXALATE 10 MG PO TABS
5.0000 mg | ORAL_TABLET | Freq: Every day | ORAL | Status: DC
  Administered 2021-06-30 – 2021-07-05 (×6): 5 mg via ORAL
  Filled 2021-06-30 (×7): qty 0.5

## 2021-06-30 MED ORDER — SODIUM CHLORIDE 0.9 % IV BOLUS (SEPSIS)
1000.0000 mL | Freq: Once | INTRAVENOUS | Status: AC
  Administered 2021-06-30: 1000 mL via INTRAVENOUS

## 2021-06-30 MED ORDER — ACETAMINOPHEN 325 MG PO TABS: 650.0000 mg | ORAL_TABLET | Freq: Four times a day (QID) | ORAL | Status: DC | PRN

## 2021-06-30 MED ORDER — VITAMIN B-6 50 MG PO TABS
100.0000 mg | ORAL_TABLET | Freq: Every day | ORAL | Status: DC
  Administered 2021-07-01 – 2021-07-05 (×5): 100 mg via ORAL
  Filled 2021-06-30 (×9): qty 2

## 2021-06-30 MED ORDER — QUETIAPINE FUMARATE 25 MG PO TABS
50.0000 mg | ORAL_TABLET | Freq: Every day | ORAL | Status: DC
  Administered 2021-06-30: 50 mg via ORAL
  Filled 2021-06-30: qty 2

## 2021-06-30 MED ORDER — QUETIAPINE FUMARATE 100 MG PO TABS
150.0000 mg | ORAL_TABLET | Freq: Every day | ORAL | Status: DC
  Administered 2021-06-30 – 2021-07-05 (×6): 150 mg via ORAL
  Filled 2021-06-30 (×2): qty 1.5
  Filled 2021-06-30: qty 6
  Filled 2021-06-30: qty 1.5
  Filled 2021-06-30: qty 6
  Filled 2021-06-30 (×2): qty 1.5

## 2021-06-30 MED ORDER — DIVALPROEX SODIUM ER 500 MG PO TB24
500.0000 mg | ORAL_TABLET | Freq: Every day | ORAL | Status: DC
  Administered 2021-07-01 – 2021-07-06 (×6): 500 mg via ORAL
  Filled 2021-06-30 (×6): qty 1

## 2021-06-30 MED ORDER — DIVALPROEX SODIUM ER 500 MG PO TB24
1000.0000 mg | ORAL_TABLET | Freq: Every day | ORAL | Status: DC
  Administered 2021-06-30 – 2021-07-05 (×6): 1000 mg via ORAL
  Filled 2021-06-30 (×8): qty 2

## 2021-06-30 MED ORDER — ONDANSETRON HCL 4 MG/2ML IJ SOLN
4.0000 mg | Freq: Four times a day (QID) | INTRAMUSCULAR | Status: DC | PRN
  Administered 2021-07-02: 4 mg via INTRAVENOUS
  Filled 2021-06-30: qty 2

## 2021-06-30 MED ORDER — VANCOMYCIN HCL IN DEXTROSE 1-5 GM/200ML-% IV SOLN: 1000.0000 mg | Freq: Once | INTRAVENOUS | Status: DC

## 2021-06-30 MED ORDER — LISINOPRIL 5 MG PO TABS
5.0000 mg | ORAL_TABLET | Freq: Once | ORAL | Status: AC
  Administered 2021-06-30: 5 mg via ORAL

## 2021-06-30 MED ORDER — ENOXAPARIN SODIUM 40 MG/0.4ML IJ SOSY
40.0000 mg | PREFILLED_SYRINGE | INTRAMUSCULAR | Status: DC
  Administered 2021-06-30 – 2021-07-05 (×6): 40 mg via SUBCUTANEOUS
  Filled 2021-06-30 (×6): qty 0.4

## 2021-06-30 MED ORDER — GABAPENTIN 100 MG PO CAPS: 100.0000 mg | ORAL_CAPSULE | Freq: Three times a day (TID) | ORAL | Status: DC

## 2021-06-30 MED ORDER — LEVOFLOXACIN IN D5W 750 MG/150ML IV SOLN
750.0000 mg | INTRAVENOUS | Status: DC
  Administered 2021-06-30: 750 mg via INTRAVENOUS
  Filled 2021-06-30 (×2): qty 150

## 2021-06-30 MED ORDER — ALBUTEROL SULFATE HFA 108 (90 BASE) MCG/ACT IN AERS
2.0000 | INHALATION_SPRAY | Freq: Four times a day (QID) | RESPIRATORY_TRACT | Status: DC | PRN
  Filled 2021-06-30: qty 6.7

## 2021-06-30 MED ORDER — QUETIAPINE FUMARATE 25 MG PO TABS
50.0000 mg | ORAL_TABLET | Freq: Every day | ORAL | Status: DC
  Administered 2021-07-01 – 2021-07-06 (×6): 50 mg via ORAL
  Filled 2021-06-30 (×6): qty 2

## 2021-06-30 MED ORDER — LACTATED RINGERS IV SOLN: INTRAVENOUS | Status: DC

## 2021-06-30 MED ORDER — SODIUM CHLORIDE 0.9 % IV SOLN
200.0000 mg | Freq: Once | INTRAVENOUS | Status: DC
  Filled 2021-06-30: qty 40

## 2021-06-30 MED ORDER — SODIUM CHLORIDE 0.9 % IV SOLN: 100.0000 mg | Freq: Every day | INTRAVENOUS | Status: DC

## 2021-06-30 MED ORDER — LEVOFLOXACIN IN D5W 750 MG/150ML IV SOLN: 750.0000 mg | Freq: Once | INTRAVENOUS | Status: DC

## 2021-06-30 MED ORDER — THIAMINE HCL 100 MG PO TABS
100.0000 mg | ORAL_TABLET | Freq: Every day | ORAL | Status: DC
  Administered 2021-06-30 – 2021-07-05 (×6): 100 mg via ORAL
  Filled 2021-06-30 (×6): qty 1

## 2021-06-30 MED ORDER — ROSUVASTATIN CALCIUM 20 MG PO TABS: 20.0000 mg | ORAL_TABLET | Freq: Every day | ORAL | Status: DC

## 2021-06-30 MED ORDER — ZINC SULFATE 220 (50 ZN) MG PO CAPS
220.0000 mg | ORAL_CAPSULE | Freq: Every day | ORAL | Status: DC
  Administered 2021-06-30 – 2021-07-06 (×7): 220 mg via ORAL
  Filled 2021-06-30 (×7): qty 1

## 2021-06-30 MED ORDER — FOLIC ACID 1 MG PO TABS
1.0000 mg | ORAL_TABLET | Freq: Every day | ORAL | Status: DC
  Administered 2021-07-01 – 2021-07-06 (×6): 1 mg via ORAL
  Filled 2021-06-30 (×6): qty 1

## 2021-06-30 MED ORDER — NIRMATRELVIR/RITONAVIR (PAXLOVID) TABLET (RENAL DOSING)
2.0000 | ORAL_TABLET | Freq: Two times a day (BID) | ORAL | Status: DC
Start: 2021-06-30 — End: 2021-06-30
  Filled 2021-06-30: qty 20

## 2021-06-30 MED ORDER — ASCORBIC ACID 500 MG PO TABS
500.0000 mg | ORAL_TABLET | Freq: Every day | ORAL | Status: DC
  Administered 2021-06-30 – 2021-07-06 (×7): 500 mg via ORAL
  Filled 2021-06-30 (×7): qty 1

## 2021-06-30 MED ORDER — ONDANSETRON HCL 4 MG PO TABS: 4.0000 mg | ORAL_TABLET | Freq: Four times a day (QID) | ORAL | Status: DC | PRN

## 2021-06-30 MED ORDER — DIVALPROEX SODIUM ER 250 MG PO TB24
500.0000 mg | ORAL_TABLET | Freq: Two times a day (BID) | ORAL | Status: DC
  Administered 2021-06-30 (×2): 500 mg via ORAL
  Filled 2021-06-30: qty 4
  Filled 2021-06-30: qty 2
  Filled 2021-06-30 (×2): qty 4

## 2021-06-30 MED ORDER — DEXAMETHASONE SODIUM PHOSPHATE 10 MG/ML IJ SOLN: 10.0000 mg | Freq: Once | INTRAMUSCULAR | Status: DC

## 2021-06-30 MED ORDER — QUETIAPINE FUMARATE 100 MG PO TABS
100.0000 mg | ORAL_TABLET | Freq: Every day | ORAL | Status: DC
Start: 2021-06-30 — End: 2021-07-06
  Administered 2021-06-30 – 2021-07-05 (×6): 100 mg via ORAL
  Filled 2021-06-30 (×2): qty 1
  Filled 2021-06-30: qty 4
  Filled 2021-06-30: qty 1
  Filled 2021-06-30: qty 4
  Filled 2021-06-30 (×2): qty 1
  Filled 2021-06-30: qty 4
  Filled 2021-06-30: qty 1

## 2021-06-30 NOTE — ED Notes (Signed)
Patient is resting comfortably. Pt is following commands, calm and cooperative. No other needs found a this moment.

## 2021-06-30 NOTE — Sepsis Progress Note (Signed)
ELink monitoring sepsis protocol 

## 2021-06-30 NOTE — H&P (Addendum)
History and Physical    Patient: Paul Vaughan [id number] DOB: [birth year] DOA: 06/30/2021 DOS: the patient was seen and examined on 06/30/2021 PCP: System, Provider Not In  Patient coming from: Home  Chief Complaint:  Chief Complaint  Patient presents with   Weakness    HPI: Paul Vaughan is a 66 y.o. male with medical history significant for stage IIIa chronic kidney disease, hypertension, dementia who was sent from the group home where he resides to the ER for evaluation of increased weakness and unsteady gait.  He was found to have a fever by EMS with a temp of 100 and was tachycardic with heart rate of 112. Upon arrival to the ER he was noted to be tachycardic and had a Tmax of 102.9. Per group home staff they note that he is more confused compared to his baseline and appeared to be less responsive than his normal. I am unable to do a review of systems on this patient due to his underlying dementia.  Review of Systems: As mentioned in the history of present illness. All other systems reviewed and are negative. History reviewed. No pertinent past medical history. History reviewed. No pertinent surgical history. Social History:  reports that he has been smoking cigarettes. He has been smoking an average of 1 pack per day. He has never used smokeless tobacco. He reports that he does not drink alcohol and does not use drugs.  Allergies  Allergen Reactions   Penicillins Rash and Swelling    .Has patient had a PCN reaction causing immediate rash, facial/tongue/throat swelling, SOB or lightheadedness with hypotension: Unknown Has patient had a PCN reaction causing severe rash involving mucus membranes or skin necrosis: Unknown Has patient had a PCN reaction that required hospitalization: Unknown Has patient had a PCN reaction occurring within the last 10 years: Unknown If all of the above answers are "NO", then may proceed with Cephalosporin use.    Sulfa Antibiotics Rash and  Swelling    History reviewed. No pertinent family history.  Prior to Admission medications   Medication Sig Start Date End Date Taking? Authorizing Provider  QUEtiapine (SEROQUEL) 25 MG tablet Take 2 tablets (50 mg total) by mouth at bedtime. 11/24/19   Merlyn Lot, MD  sertraline (ZOLOFT) 25 MG tablet Take 1 tablet (25 mg total) by mouth daily. 11/24/19 11/23/20  Merlyn Lot, MD    Physical Exam: Vitals:   06/30/21 0930 06/30/21 1000 06/30/21 1030 06/30/21 1130  BP: 126/80 131/76 130/67 (!) 148/77  Pulse: (!) 106 (!) 110 (!) 108 (!) 115  Resp: 15 19 (!) 24 18  Temp:      TempSrc:      SpO2: 99% 97% 97% 94%  Weight:      Height:       Physical Exam Constitutional:      Appearance: Normal appearance. He is ill-appearing.  HENT:     Head: Normocephalic and atraumatic.     Nose: Nose normal.     Mouth/Throat:     Mouth: Mucous membranes are moist.  Eyes:     Pupils: Pupils are equal, round, and reactive to light.  Cardiovascular:     Rate and Rhythm: Tachycardia present.  Pulmonary:     Breath sounds: Rhonchi present.     Comments: Scattered Abdominal:     General: Abdomen is flat. Bowel sounds are normal.     Palpations: Abdomen is soft.  Musculoskeletal:        General: Normal range of motion.  Cervical back: Normal range of motion and neck supple.  Skin:    General: Skin is warm and dry.  Neurological:     General: No focal deficit present.     Mental Status: He is alert.     Comments: Oriented only to person  Psychiatric:        Mood and Affect: Mood normal.        Behavior: Behavior normal.     Data Reviewed: Notes from primary care and specialist visits, past discharge summaries. Prior diagnostic testing as applicable to current admission diagnoses Updated medications and problem lists for reconciliation ED course, including vitals, labs, imaging, treatment and response to treatment Triage notes and ED providers notes Labs reviewed Lactic  acid 2.4 >> 1.6 BUN 36, creatinine 1.95, white count 10.0 Respiratory viral panel is positive for SARS coronavirus 2 Chest x-ray reviewed by me shows ill-defined opacity within the medial right lung base which may reflect atelectasis or pneumonia. There are no new results to review at this time.  Assessment and Plan: Principal Problem:   Sepsis ([place]) Active Problems:   Borderline intellectual disability   COVID-19 virus infection   CAP (community acquired pneumonia)  Sepsis from presumed community-acquired pneumonia R/O Aspiration pneumonia As evidenced by fever with a Tmax of 102.9, tachycardia, imaging with a right middle lobe infiltrate and lactic acidosis. Patient received 1 L IV fluid bolus in the ER as well as antibiotic therapy with Azactam, vancomycin and Flagyl Continue IV fluid resuscitation We will treat patient empirically with Levaquin adjusted to renal function due to penicillin allergy Follow-up results of blood cultures Speech therapy for swallow function evaluation Add Procalcitonin levels     COVID-19 viral infection with encephalopathy Patient is unvaccinated His COVID-19 PCR test is positive We will place patient on Paxlovid No indication for systemic steroids at this time since he is not hypoxic Supportive care with antitussives and vitamin Patient is noted to be weak and less responsive than his baseline and this is most likely secondary to COVID-19 viral infection    Dementia Continue Seroquel    Stage 3 CKD Stable  Advance Care Planning:   Code Status: Full Code   Consults: None  Family Communication: Greater than 50% of time was spent discussing patient's condition and plan of care with his caregiver over the phone.  All questions and concerns have been addressed.  She verbalizes understanding and agrees with the plan.  Severity of Illness: The appropriate patient status for this patient is INPATIENT. Inpatient status is judged to be  reasonable and necessary in order to provide the required intensity of service to ensure the patient's safety. The patient's presenting symptoms, physical exam findings, and initial radiographic and laboratory data in the context of their chronic comorbidities is felt to place them at high risk for further clinical deterioration. Furthermore, it is not anticipated that the patient will be medically stable for discharge from the hospital within 2 midnights of admission.   * I certify that at the point of admission it is my clinical judgment that the patient will require inpatient hospital care spanning beyond 2 midnights from the point of admission due to high intensity of service, high risk for further deterioration and high frequency of surveillance required.*  Author: Collier Bullock, MD 06/30/2021 1:03 PM  For on call review [email or web address].

## 2021-06-30 NOTE — Consult Note (Signed)
PHARMACY -  BRIEF ANTIBIOTIC NOTE   Pharmacy has received consult(s) for vancomycin and azethreonam from an ED provider.  The patient's profile has been reviewed for ht/wt/allergies/indication/available labs.    One time order(s) placed for azethreonam 2 g and vancomycin 1500 mg IV   Further antibiotics/pharmacy consults should be ordered by admitting physician if indicated.                       Thank you, Derrek Gu, PharmD 06/30/2021  9:08 AM

## 2021-06-30 NOTE — ED Triage Notes (Signed)
Pt comes into the ED via [facility] from his group home [facility] and [facility] group home) for increasing weakness with decreasing gait with ambulation.  H/o dementia.  Per facility he is normally ambulatory at baseline.  EMS states the patient was able to ambulate with assistance.  Pt denies any complaints at this time.   100 oral 122 CBG 157/69 112 HR

## 2021-06-30 NOTE — Consult Note (Signed)
CODE SEPSIS - PHARMACY COMMUNICATION  **Broad Spectrum Antibiotics should be administered within 1 hour of Sepsis diagnosis**  Time Code Sepsis Called/Page Received: 3710  Antibiotics Ordered: 0851  Time of 1st antibiotic administration: 0935  Additional action taken by pharmacy: N/A  If necessary, Name of Provider/Nurse Contacted: N/A    Derrek Gu ,PharmD Clinical Pharmacist  06/30/2021  9:00 AM

## 2021-06-30 NOTE — ED Notes (Signed)
This tech is currently sitting w pt. 1:1 Recruitment consultant

## 2021-06-30 NOTE — ED Provider Notes (Signed)
[facility] Provider Note    Event Date/Time   First MD Initiated Contact with Patient 06/30/21 [phone]     (approximate)  History   Chief Complaint: Weakness  HPI  Paul Vaughan is a 66 y.o. male who presents to the emergency department from his group facility for weakness found to be febrile.  According to EMS report patient is coming from his group facility today where they noted the patient to be more weak and tired acting.  They found the patient to be febrile to 101 and transported the patient to the emergency department.  Here the patient is awake alert.  He denies any complaints.  Denies any pain.  States he has had a slight cough at times.  Patient found to be febrile to 102.5 in the emergency department.  Physical Exam   Triage Vital Signs: ED Triage Vitals [06/30/21 0843]  Enc Vitals Group     BP      Pulse      Resp      Temp      Temp src      SpO2      Weight 149 lb 14.6 oz (68 kg)     Height 6' (1.829 m)     Head Circumference      Peak Flow      Pain Score 0     Pain Loc      Pain Edu?      Excl. in GC?     Most recent vital signs: There were no vitals filed for this visit.  General: Awake, no distress.  Calm and cooperative. CV:  Good peripheral perfusion.  Regular rhythm rate around 110 bpm. Resp:  Normal effort.  Equal breath sounds bilaterally.  No wheeze rales or rhonchi. Abd:  No distention.  Soft, nontender.  No rebound or guarding.  Benign abdomen Other:  No lower extremity edema   ED Results / Procedures / Treatments   EKG  EKG viewed and interpreted by myself shows sinus tachycardia 113 bpm with a narrow QRS, left axis deviation, largely normal intervals with no concerning ST changes.  RADIOLOGY  I personally reviewed the chest x-ray finding patient has shallow inspiration difficult to evaluate the x-ray for pneumonia. Radiology read the chest x-ray is ill-defined opacity in the right lung base could represent  pneumonia versus atelectasis.   MEDICATIONS ORDERED IN ED: Medications  aztreonam (AZACTAM) 2 g in sodium chloride 0.9 % 100 mL IVPB (has no administration in time range)  metroNIDAZOLE (FLAGYL) IVPB 500 mg (has no administration in time range)  vancomycin (VANCOCIN) IVPB 1000 mg/200 mL premix (has no administration in time range)  sodium chloride 0.9 % bolus 1,000 mL (has no administration in time range)     IMPRESSION / MDM / ASSESSMENT AND PLAN / ED COURSE  I reviewed the triage vital signs and the nursing notes.  Patient presents to the emergency department for weakness found to be febrile to 102.5.  In the emergency department patient is also tachycardic meeting sepsis criteria for presumed infectious etiology.  We will check labs, send blood cultures obtain a chest x-ray, urine, COVID/flu.  We will IV hydrate and continue to closely monitor.  Per EMS report patient received Tylenol just prior to transport to the hospital.  We will start broad-spectrum IV antibiotics until the infectious etiology is more clear.  Patient's labs have resulted showing an elevated lactic acid of 2.4.  Patient's COVID test has resulted positive.  Reassuringly the urine and CBC including WBC are normal.  Chemistry is reassuring but there is mild renal insufficiency noted, last creatinine was 1.38 prior to today.  Given the patient's increased confusion and weakness today along with COVID-positive status we will admit to the hospitalist service.  Patient is quite febrile 102.9 tachycardic.  We will order remdesivir.  CRITICAL CARE Performed by: Minna Antis   Total critical care time: 30 minutes  Critical care time was exclusive of separately billable procedures and treating other patients.  Critical care was necessary to treat or prevent imminent or life-threatening deterioration.  Critical care was time spent personally by me on the following activities: development of treatment plan with patient  and/or surrogate as well as nursing, discussions with consultants, evaluation of patient's response to treatment, examination of patient, obtaining history from patient or surrogate, ordering and performing treatments and interventions, ordering and review of laboratory studies, ordering and review of radiographic studies, pulse oximetry and re-evaluation of patient's condition.   FINAL CLINICAL IMPRESSION(S) / ED DIAGNOSES   Sepsis Weakness Fever COVID-19  Note:  This document was prepared using Dragon voice recognition software and may include unintentional dictation errors.   Minna Antis, MD 06/30/21 1233

## 2021-06-30 NOTE — ED Notes (Signed)
Provider Agbata notified via secure chat that pt's BP remains elevated at 161/93 currently as no PRN meds noted to give. Awaiting reply.

## 2021-06-30 NOTE — ED Notes (Signed)
Patient's room is clean on inpatient side. Nurse is ready for patient. Sitter/tech is going to take patient to floor.

## 2021-06-30 NOTE — ED Notes (Signed)
Patient is resting comfortably. 

## 2021-06-30 NOTE — ED Notes (Signed)
Patient resting comfortably on stretcher with blanket over his head. His respirations are noted to even and unlabored once the blanket was pulled back.. Patient had his eyes closed. He did not verbalize any needs or complaints at this time. Patient has a sitter at bedside in direct visualization of him due to his tendency to pull at IV lines and remove his monitoring equipment.

## 2021-06-30 NOTE — ED Notes (Signed)
INC CARE PROVIDED. NEW MALE PUREWICK APPLIED

## 2021-06-30 NOTE — ED Notes (Signed)
RN went to patient room due to patient no longer being on cardiac monitor.  Pt found to have climbed over bed rails and is sitting in chair in his room.  Pt pulled both IV's out and had a bowel movement.  Antibiotics stopped on the pump, old IV sites dressed and cleaned, and patient cleaned with new pull-up, external catheter, and escorted back to the bed.  Charge RN called and informed that sitter will be needed for patient due to confusion of the patient.

## 2021-06-30 NOTE — ED Notes (Signed)
Informed RN assigned 

## 2021-06-30 NOTE — ED Notes (Signed)
Pt given meal tray. HOB adjusted to 75-80 degrees. NT sitter remains with pt.

## 2021-06-30 NOTE — Consult Note (Signed)
Pharmacy Antibiotic Note  Paul Vaughan is a 66 y.o. male admitted on 06/30/2021 with pneumonia.  Pharmacy has been consulted for levofloxacin dosing.  Plan: Levofloxacin 750 mg q48H x 5 days.   Height: 6' (182.9 cm) Weight: 68 kg (149 lb 14.6 oz) IBW/kg (Calculated) : 77.6  Temp (24hrs), Avg:102.9 F (39.4 C), Min:102.9 F (39.4 C), Max:102.9 F (39.4 C)  Recent Labs  Lab 06/30/21 0900 06/30/21 1046  WBC 10.0  --   CREATININE 1.95*  --   LATICACIDVEN 2.4* 1.6    Estimated Creatinine Clearance: 35.8 mL/min (A) (by C-G formula based on SCr of 1.95 mg/dL (H)).    Allergies  Allergen Reactions   Penicillins Rash and Swelling    .Has patient had a PCN reaction causing immediate rash, facial/tongue/throat swelling, SOB or lightheadedness with hypotension: Unknown Has patient had a PCN reaction causing severe rash involving mucus membranes or skin necrosis: Unknown Has patient had a PCN reaction that required hospitalization: Unknown Has patient had a PCN reaction occurring within the last 10 years: Unknown If all of the above answers are "NO", then may proceed with Cephalosporin use.    Sulfa Antibiotics Rash and Swelling     Thank you for allowing pharmacy to be a part of this patients care.  Ronnald Ramp 06/30/2021 1:00 PM

## 2021-06-30 NOTE — ED Notes (Signed)
Floor RN TR confirmed that room 123 still dirty and in process of being cleaned.

## 2021-07-01 ENCOUNTER — Encounter: Payer: Self-pay | Admitting: Internal Medicine

## 2021-07-01 DIAGNOSIS — D638 Anemia in other chronic diseases classified elsewhere: Secondary | ICD-10-CM

## 2021-07-01 DIAGNOSIS — F4325 Adjustment disorder with mixed disturbance of emotions and conduct: Secondary | ICD-10-CM

## 2021-07-01 DIAGNOSIS — D696 Thrombocytopenia, unspecified: Secondary | ICD-10-CM

## 2021-07-01 DIAGNOSIS — D649 Anemia, unspecified: Secondary | ICD-10-CM

## 2021-07-01 DIAGNOSIS — N183 Chronic kidney disease, stage 3 unspecified: Secondary | ICD-10-CM

## 2021-07-01 DIAGNOSIS — N179 Acute kidney failure, unspecified: Secondary | ICD-10-CM

## 2021-07-01 LAB — CBC
HCT: 27.5 % — ABNORMAL LOW (ref 39.0–52.0)
Hemoglobin: 9.4 g/dL — ABNORMAL LOW (ref 13.0–17.0)
MCH: 32.3 pg (ref 26.0–34.0)
MCHC: 34.2 g/dL (ref 30.0–36.0)
MCV: 94.5 fL (ref 80.0–100.0)
Platelets: 120 10*3/uL — ABNORMAL LOW (ref 150–400)
RBC: 2.91 MIL/uL — ABNORMAL LOW (ref 4.22–5.81)
RDW: 13.4 % (ref 11.5–15.5)
WBC: 6.7 10*3/uL (ref 4.0–10.5)
nRBC: 0 % (ref 0.0–0.2)

## 2021-07-01 LAB — BASIC METABOLIC PANEL
Anion gap: 9 (ref 5–15)
BUN: 29 mg/dL — ABNORMAL HIGH (ref 8–23)
CO2: 22 mmol/L (ref 22–32)
Calcium: 8.5 mg/dL — ABNORMAL LOW (ref 8.9–10.3)
Chloride: 103 mmol/L (ref 98–111)
Creatinine, Ser: 1.65 mg/dL — ABNORMAL HIGH (ref 0.61–1.24)
GFR, Estimated: 46 mL/min — ABNORMAL LOW (ref >60)
Glucose, Bld: 95 mg/dL (ref 70–99)
Potassium: 3.8 mmol/L (ref 3.5–5.1)
Sodium: 134 mmol/L — ABNORMAL LOW (ref 135–145)

## 2021-07-01 LAB — C-REACTIVE PROTEIN: CRP: 13.4 mg/dL — ABNORMAL HIGH (ref <1.0)

## 2021-07-01 LAB — PROTIME-INR
INR: 1.2 (ref 0.8–1.2)
Prothrombin Time: 14.9 seconds (ref 11.4–15.2)

## 2021-07-01 LAB — PROCALCITONIN: Procalcitonin: 0.52 ng/mL

## 2021-07-01 LAB — HIV ANTIBODY (ROUTINE TESTING W REFLEX): HIV Screen 4th Generation wRfx: NONREACTIVE

## 2021-07-01 LAB — CORTISOL-AM, BLOOD: Cortisol - AM: 11.7 ug/dL (ref 6.7–22.6)

## 2021-07-01 NOTE — Assessment & Plan Note (Addendum)
Likely secondary to sepsis.  Platelet count up to 141.

## 2021-07-01 NOTE — Assessment & Plan Note (Signed)
Patient on Lexapro, Depakote, gabapentin, Seroquel.

## 2021-07-01 NOTE — Evaluation (Signed)
Physical Therapy Evaluation Patient Details Name: Paul Vaughan MRN: [id number] DOB: 02/17/1956 Today's Date: 07/01/2021  History of Present Illness  Patient is a 66 year old male who presented to ED with weakness and limited ability to ambulate. Patient lives at [facility] and [place]. PMH includes dementia, CKD stage IIIa, HTN. He was admitted with COVID and sepsis.   Clinical Impression  Patient is a pleasantly confused 66 year old male who presents with generalized weakness and instability secondary to COVID. Prior to hospital admission, pt was living in [place] and [facility] and was independent per previous documentation. Patient unable to provide history. Patient follows intermittent commands however unsure if command follow is limited by cognition and/or refusal to perform tasks. Patient rolled L and R for pericare without assistance as well as transitioned to EOB. He refused to stand or perform seated strengthening tasks with PT limiting session evaluation at this time. Patient returned to bed with needs met and sitter present.  Pt would benefit from skilled PT to address noted impairments and functional limitations (see below for any additional details).  Upon hospital discharge, pt would benefit from HHPT and constant assistance.        Recommendations for follow up therapy are one component of a multi-disciplinary discharge planning process, led by the attending physician.  Recommendations may be updated based on patient status, additional functional criteria and insurance authorization.  Follow Up Recommendations Home health PT    Assistance Recommended at Discharge Frequent or constant Supervision/Assistance  Patient can return home with the following  A little help with walking and/or transfers;A little help with bathing/dressing/bathroom;Assistance with cooking/housework;Direct supervision/assist for medications management;Assist for transportation;Help with stairs or  ramp for entrance;Direct supervision/assist for financial management    Equipment Recommendations Rolling walker (2 wheels)  Recommendations for Other Services  OT consult    Functional Status Assessment Patient has had a recent decline in their functional status and demonstrates the ability to make significant improvements in function in a reasonable and predictable amount of time.     Precautions / Restrictions Precautions Precautions: Fall Restrictions Weight Bearing Restrictions: No      Mobility  Bed Mobility Overal bed mobility: Modified Independent             General bed mobility comments: Patient rolled L and R without assistance, supine to sit EOB with additional time.    Transfers                   General transfer comment: Patient refused to stand    Ambulation/Gait               General Gait Details: Patient refused to walk  Stairs            Wheelchair Mobility    Modified Rankin (Stroke Patients Only)       Balance Overall balance assessment: Needs assistance Sitting-balance support: Feet supported Sitting balance-Leahy Scale: Good Sitting balance - Comments: able to static sit without LOB       Standing balance comment: refused to stand                             Pertinent Vitals/Pain Pain Assessment Pain Assessment: No/denies pain    Home Living Family/patient expects to be discharged to:: Group home Living Arrangements: Group Home                 Additional Comments: Patient  lives at [facility] and [place]    Prior Function Prior Level of Function : Independent/Modified Independent             Mobility Comments: Per previous documentation patient is independent at baseline.       Hand Dominance        Extremity/Trunk Assessment   Upper Extremity Assessment Upper Extremity Assessment: Defer to OT evaluation    Lower Extremity Assessment Lower Extremity Assessment:  Generalized weakness;Difficult to assess due to impaired cognition (Patient has intermittent understanding of simple commands and/or refusal of requests.)    Cervical / Trunk Assessment Cervical / Trunk Assessment: Normal  Communication   Communication: Other (comment) (limited cognition)  Cognition Arousal/Alertness: Lethargic Behavior During Therapy: Flat affect Overall Cognitive Status: No family/caregiver present to determine baseline cognitive functioning                                 General Comments: Patient has history of cognition deficits at baseline, unsure of his current level compared to previous level.        General Comments General comments (skin integrity, edema, etc.): Patient appears well nourished and groomed    Exercises Other Exercises Other Exercises: Patient educated on role of PT in acute care setting, safe mobility, clean up s/p bowel movement.   Assessment/Plan    PT Assessment Patient needs continued PT services  PT Problem List Decreased strength;Decreased activity tolerance;Decreased balance;Decreased cognition;Decreased mobility;Decreased safety awareness       PT Treatment Interventions DME instruction;Gait training;Neuromuscular re-education;Stair training;Functional mobility training;Therapeutic activities;Patient/family education;Cognitive remediation;Balance training;Therapeutic exercise;Manual techniques    PT Goals (Current goals can be found in the Care Plan section)  Acute Rehab PT Goals PT Goal Formulation: Patient unable to participate in goal setting    Frequency Min 2X/week     Co-evaluation               AM-PAC PT "6 Clicks" Mobility  Outcome Measure Help needed turning from your back to your side while in a flat bed without using bedrails?: None Help needed moving from lying on your back to sitting on the side of a flat bed without using bedrails?: A Little Help needed moving to and from a bed to a chair  (including a wheelchair)?: A Little Help needed standing up from a chair using your arms (e.g., wheelchair or bedside chair)?: A Lot Help needed to walk in hospital room?: A Lot Help needed climbing 3-5 steps with a railing? : A Lot 6 Click Score: 16    End of Session   Activity Tolerance: Patient limited by fatigue;Other (comment) (limited by cognition/patient refusal) Patient left: in bed;with call bell/phone within reach;with nursing/sitter in room Nurse Communication: Mobility status PT Visit Diagnosis: Unsteadiness on feet (R26.81);Other abnormalities of gait and mobility (R26.89);Muscle weakness (generalized) (M62.81);Difficulty in walking, not elsewhere classified (R26.2)    Time: 1410-1430 PT Time Calculation (min) (ACUTE ONLY): 20 min   Charges:   PT Evaluation $PT Eval Moderate Complexity: 1 Mod         Thank you for this referral.  Janna Arch, PT, DPT  07/01/2021, 2:53 PM

## 2021-07-01 NOTE — Assessment & Plan Note (Addendum)
Sitter discontinued.

## 2021-07-01 NOTE — Assessment & Plan Note (Addendum)
Pulse ox of 82% on room air on 07/03/2021.  Patient placed on 3 L of oxygen.  Incentive spirometer ordered.  On 07/03/2021 Paxlovid discontinued and started remdesivir.  Continue Solu-Medrol.  Patient off oxygen today.  CRP trended better from 14.0 down to 8.2.

## 2021-07-01 NOTE — Progress Notes (Signed)
°  Progress Note   Patient: Paul Vaughan [id number] DOB: 24-Jun-1955 DOA: 06/30/2021     1 DOS: the patient was seen and examined on 07/01/2021    Assessment and Plan: * Sepsis ([place])- (present on admission) Clinical sepsis present on admission with COVID-19 virus infection and pneumonia.  Patient started on Paxlovid and Levaquin.  Fever of 102.9 on presentation with tachycardia.  COVID-19 virus infection- (present on admission) Patient started on Paxlovid.  CAP (community acquired pneumonia) Patient started on Levaquin.  Acute kidney injury superimposed on CKD ([place]) Acute kidney injury on chronic disease stage IIIa.  Creatinine 1.95 on presentation down to 1.65.  Will discontinue fluids.  Anemia, unspecified Last hemoglobin 9.4.  Discontinue IV fluids.  Send off iron studies in the morning  Thrombocytopenia ([place]) Likely secondary to sepsis.  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  Borderline intellectual disability Currently has a sitter        Subjective: Patient answers a few simple questions.  Does not feel well.  Little bit of a cough.  Physical Exam: Vitals:   06/30/21 1930 06/30/21 2033 06/30/21 2351 07/01/21 0612  BP: (!) 114/59 127/72 (!) 107/58 (!) 98/59  Pulse: 93 (!) 103 94 93  Resp: 18 16 16 16   Temp:  98.6 F (37 C) 99.1 F (37.3 C) 99.5 F (37.5 C)  TempSrc:  Oral Oral Oral  SpO2: 96% 96% 95% 94%  Weight:  83.8 kg    Height:  6' (1.829 m)     Physical Exam HENT:     Head: Normocephalic.     Mouth/Throat:     Pharynx: No oropharyngeal exudate.  Eyes:     General: Lids are normal.     Conjunctiva/sclera: Conjunctivae normal.  Cardiovascular:     Rate and Rhythm: Normal rate and regular rhythm.     Heart sounds: Normal heart sounds, S1 normal and S2 normal.  Pulmonary:     Breath sounds: Examination of the  right-lower field reveals decreased breath sounds and rhonchi. Examination of the left-lower field reveals decreased breath sounds and rhonchi. Decreased breath sounds and rhonchi present. No wheezing or rales.  Abdominal:     Palpations: Abdomen is soft.     Tenderness: There is no abdominal tenderness.  Musculoskeletal:     Right lower leg: No swelling.     Left lower leg: No swelling.  Skin:    General: Skin is warm.     Findings: No rash.  Neurological:     Mental Status: He is alert.     Comments: Able to follow some simple commands.     Data Reviewed: Laboratory data, radiological data reviewed by me  Family Communication: Spoke with group home owner to watch for symptoms with the residents of the next 3 to 5 days.  Disposition: Status is: Inpatient Remains inpatient appropriate because: Has COVID-19 infection lives at a group home will need to isolate here for 5 days and then masking for 5 days at his residence.  Planned Discharge Destination: Group home  Author: Loletha Grayer, MD 07/01/2021 3:13 PM  For on call review [email or web address].

## 2021-07-01 NOTE — TOC Initial Note (Addendum)
Transition of Care [facility]) - Initial/Assessment Note    Patient Details  Name: Paul Vaughan MRN: [id number] Date of Birth: 26-May-1955  Transition of Care [facility] CM/SW Contact:    Magnus Ivan, LCSW Phone Number: 07/01/2021, 1:03 PM  Clinical Narrative:           Called to main contact [name]. [name] stated she is the caregiver/primary contact at patient's group home.  Per MD request, inquired if patient will need to stay at the hospital due to being positive for COVID and having a roommate at the group home.  [name] stated patient would need to stay at the hospital until we feel he is no longer symptomatic/contagious. [name] stated when patient is medically cleared for DC, she will provide transportation.  Checked with MD who says the recommendation is 5 days quarantine then 5 days masking at group home. Notified [name] who is in agreement.  [name] requested TOC call her on 2/21 to plan for transport back to group home on 2/22 (5 days from positive test). Updated handoff.   Expected Discharge Plan: Group Home Barriers to Discharge: Continued Medical Work up   Patient Goals and CMS Choice Patient states their goals for this hospitalization and ongoing recovery are:: return to group home CMS Medicare.gov Compare Post Acute Care list provided to:: Patient Represenative (must comment) Choice offered to / list presented to : NA  Expected Discharge Plan and Services Expected Discharge Plan: [place] arrangements for the past 2 months: Group Home                                      Prior Living Arrangements/Services Living arrangements for the past 2 months: Group Home Lives with:: Facility Resident            Care giver support system in place?: Yes (comment)   Criminal Activity/Legal Involvement Pertinent to Current Situation/Hospitalization: No - Comment as needed  Activities of Daily Living Home Assistive Devices/Equipment: None ADL  Screening (condition at time of admission) Patient's cognitive ability adequate to safely complete daily activities?: Yes Is the patient deaf or have difficulty hearing?: No Does the patient have difficulty seeing, even when wearing glasses/contacts?: No Does the patient have difficulty concentrating, remembering, or making decisions?: No Patient able to express need for assistance with ADLs?: Yes Does the patient have difficulty dressing or bathing?: No Independently performs ADLs?: Yes (appropriate for developmental age) Does the patient have difficulty walking or climbing stairs?: No Weakness of Legs: Both Weakness of Arms/Hands: None  Permission Sought/Granted                  Emotional Assessment       Orientation: : Fluctuating Orientation (Suspected and/or reported Sundowners) Alcohol / Substance Use: Not Applicable Psych Involvement: No (comment)  Admission diagnosis:  Confusion [R41.0] Weakness [R53.1] Sepsis ([place]) [A41.9] COVID-19 [U07.1] Patient Active Problem List   Diagnosis Date Noted   Sepsis ([place]) 06/30/2021   COVID-19 virus infection 06/30/2021   CAP (community acquired pneumonia) 06/30/2021   Borderline intellectual disability 09/08/2015   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   PCP:  Gae Bon, NP Pharmacy:   [facility] [address] [place] [address] [place] [address] Phone: [phone] Fax: [place], [place]  [address] [place] [address] Phone: [phone] Fax: [phone]     Social Determinants of Health (SDOH) Interventions    Readmission Risk Interventions Readmission Risk Prevention Plan 07/01/2021  Transportation Screening Complete  PCP or Specialist Appt within 5-7 Days Complete  Home Care Screening Complete  Medication Review (RN CM) Complete  Some recent data  might be hidden

## 2021-07-01 NOTE — Evaluation (Signed)
Clinical/Bedside Swallow Evaluation Patient Details  Name: Paul Vaughan MRN: [id number] Date of Birth: Jul 03, 1955  Today's Date: 07/01/2021 Time: SLP Start Time (ACUTE ONLY): 1125 SLP Stop Time (ACUTE ONLY): 1140 SLP Time Calculation (min) (ACUTE ONLY): 15 min  Past Medical History: History reviewed. No pertinent past medical history. Past Surgical History: History reviewed. No pertinent surgical history. HPI:  Paul Vaughan is a 66 y.o. male with medical history significant for stage IIIa chronic kidney disease, hypertension, dementia who was sent from the group home where he resides to the ER for evaluation of increased weakness and unsteady gait.  He was found to have a fever by EMS with a temp of 100 and was tachycardic with heart rate of 112. Upon arrival to the ER he was noted to be tachycardic and had a Tmax of 102.9. Per group home staff they note that he is more confused compared to his baseline and appeared to be less responsive than his normal. Chest x-ray revealed Shallow inspiration radiograph. Ill-defined opacity within the medial right lung base, which may  reflect atelectasis or pneumonia. Mild elevation of the right hemidiaphragm, new from the prior chest  radiograph of 12/13/2019. Pt COVID +.    Assessment / Plan / Recommendation  Clinical Impression  Pt presents with adequate oropharyngeal abilities when consuming regular texture snack with thin liquids via straw. Pt doesn't have a history of dysphagia and is symptoms free when consuming POs. At this time, current diet appears appriopriate. ST intervention is not indicated at this time.  SLP Visit Diagnosis: Dysphagia, unspecified (R13.10)    Aspiration Risk  Mild aspiration risk    Diet Recommendation Regular;Thin liquid   Liquid Administration via: Straw;Cup Medication Administration: Whole meds with liquid Supervision: Patient able to self feed;Full supervision/cueing for compensatory strategies;Staff to assist  with self feeding Compensations: Minimize environmental distractions;Slow rate;Small sips/bites Postural Changes: Seated upright at 90 degrees;Remain upright for at least 30 minutes after po intake    Other  Recommendations Oral Care Recommendations: Oral care BID    Recommendations for follow up therapy are one component of a multi-disciplinary discharge planning process, led by the attending physician.  Recommendations may be updated based on patient status, additional functional criteria and insurance authorization.  Follow up Recommendations No SLP follow up      Assistance Recommended at Discharge None  Functional Status Assessment Patient has not had a recent decline in their functional status  Frequency and Duration   N/A         Prognosis    N/A    Swallow Study   General Date of Onset: 06/30/21 HPI: Paul Vaughan is a 66 y.o. male with medical history significant for stage IIIa chronic kidney disease, hypertension, dementia who was sent from the group home where he resides to the ER for evaluation of increased weakness and unsteady gait.  He was found to have a fever by EMS with a temp of 100 and was tachycardic with heart rate of 112. Upon arrival to the ER he was noted to be tachycardic and had a Tmax of 102.9. Per group home staff they note that he is more confused compared to his baseline and appeared to be less responsive than his normal. Chest x-ray revealed Shallow inspiration radiograph. Ill-defined opacity within the medial right lung base, which may  reflect atelectasis or pneumonia. Mild elevation of the right hemidiaphragm, new from the prior chest  radiograph of 12/13/2019. Pt COVID +. Type of Study: Bedside Swallow Evaluation Previous  Swallow Assessment: none in chart, no history of dysphagia Diet Prior to this Study: Regular;Thin liquids Temperature Spikes Noted: Yes Respiratory Status: Room air History of Recent Intubation: No Behavior/Cognition:  Alert Oral Cavity Assessment: Within Functional Limits Oral Care Completed by SLP: No Oral Cavity - Dentition: Adequate natural dentition Vision: Functional for self-feeding Self-Feeding Abilities: Able to feed self Patient Positioning: Upright in bed Baseline Vocal Quality: Normal Volitional Cough: Cognitively unable to elicit Volitional Swallow: Unable to elicit    Oral/Motor/Sensory Function Overall Oral Motor/Sensory Function: Within functional limits   Ice Chips Ice chips: Within functional limits Presentation: Spoon   Thin Liquid Thin Liquid: Within functional limits Presentation: Straw;Self Fed    Nectar Thick Nectar Thick Liquid: Not tested   Honey Thick Honey Thick Liquid: Not tested   Puree Puree: Within functional limits Presentation: Spoon   Solid     Solid: Within functional limits Presentation: Self Fed     Zamarian Scarano B. Rutherford Nail M.S., CCC-SLP, [place] Pathologist Rehabilitation Services Office [phone]  Alphonsus Doyel Rutherford Nail 07/01/2021,11:49 AM

## 2021-07-01 NOTE — Assessment & Plan Note (Addendum)
Clinical sepsis present on admission with COVID-19 virus infection and pneumonia.  Continue remdesivir (day 2), Solu-Medrol and doxycycline.

## 2021-07-01 NOTE — Assessment & Plan Note (Deleted)
Last hemoglobin 9.4.  Discontinue IV fluids.  Send off iron studies in the morning

## 2021-07-01 NOTE — Assessment & Plan Note (Addendum)
Continue doxycycline

## 2021-07-01 NOTE — Assessment & Plan Note (Addendum)
Acute kidney injury on chronic disease stage IIIa.  Creatinine 1.95 on presentation down to 1.34.

## 2021-07-02 DIAGNOSIS — D638 Anemia in other chronic diseases classified elsewhere: Secondary | ICD-10-CM

## 2021-07-02 DIAGNOSIS — N189 Chronic kidney disease, unspecified: Secondary | ICD-10-CM

## 2021-07-02 DIAGNOSIS — R6520 Severe sepsis without septic shock: Secondary | ICD-10-CM

## 2021-07-02 DIAGNOSIS — N179 Acute kidney failure, unspecified: Secondary | ICD-10-CM

## 2021-07-02 LAB — CBC
HCT: 27.7 % — ABNORMAL LOW (ref 39.0–52.0)
Hemoglobin: 9.6 g/dL — ABNORMAL LOW (ref 13.0–17.0)
MCH: 32.2 pg (ref 26.0–34.0)
MCHC: 34.7 g/dL (ref 30.0–36.0)
MCV: 93 fL (ref 80.0–100.0)
Platelets: 116 10*3/uL — ABNORMAL LOW (ref 150–400)
RBC: 2.98 MIL/uL — ABNORMAL LOW (ref 4.22–5.81)
RDW: 13.4 % (ref 11.5–15.5)
WBC: 6 10*3/uL (ref 4.0–10.5)
nRBC: 0 % (ref 0.0–0.2)

## 2021-07-02 LAB — C-REACTIVE PROTEIN: CRP: 14 mg/dL — ABNORMAL HIGH (ref <1.0)

## 2021-07-02 LAB — BASIC METABOLIC PANEL
Anion gap: 10 (ref 5–15)
BUN: 24 mg/dL — ABNORMAL HIGH (ref 8–23)
CO2: 24 mmol/L (ref 22–32)
Calcium: 8.5 mg/dL — ABNORMAL LOW (ref 8.9–10.3)
Chloride: 103 mmol/L (ref 98–111)
Creatinine, Ser: 1.49 mg/dL — ABNORMAL HIGH (ref 0.61–1.24)
GFR, Estimated: 51 mL/min — ABNORMAL LOW (ref >60)
Glucose, Bld: 89 mg/dL (ref 70–99)
Potassium: 3.7 mmol/L (ref 3.5–5.1)
Sodium: 137 mmol/L (ref 135–145)

## 2021-07-02 LAB — IRON AND TIBC
Iron: 48 ug/dL (ref 45–182)
Saturation Ratios: 28 % (ref 17.9–39.5)
TIBC: 169 ug/dL — ABNORMAL LOW (ref 250–450)
UIBC: 121 ug/dL

## 2021-07-02 LAB — D-DIMER, QUANTITATIVE: D-Dimer, Quant: 1.14 ug/mL-FEU — ABNORMAL HIGH (ref 0.00–0.50)

## 2021-07-02 LAB — FERRITIN: Ferritin: 711 ng/mL — ABNORMAL HIGH (ref 24–336)

## 2021-07-02 LAB — URINE CULTURE

## 2021-07-02 LAB — PROCALCITONIN: Procalcitonin: 0.35 ng/mL

## 2021-07-02 MED ORDER — LEVOFLOXACIN 750 MG PO TABS: 750.0000 mg | ORAL_TABLET | Freq: Every day | ORAL | Status: DC

## 2021-07-02 MED ORDER — METHYLPREDNISOLONE SODIUM SUCC 40 MG IJ SOLR
40.0000 mg | Freq: Every day | INTRAMUSCULAR | Status: DC
  Administered 2021-07-02: 40 mg via INTRAVENOUS
  Filled 2021-07-02: qty 1

## 2021-07-02 MED ORDER — LEVOFLOXACIN IN D5W 750 MG/150ML IV SOLN
750.0000 mg | INTRAVENOUS | Status: DC
  Filled 2021-07-02: qty 150

## 2021-07-02 MED ORDER — DOXYCYCLINE HYCLATE 100 MG PO TABS
100.0000 mg | ORAL_TABLET | Freq: Two times a day (BID) | ORAL | Status: DC
  Administered 2021-07-02 – 2021-07-06 (×9): 100 mg via ORAL
  Filled 2021-07-02 (×9): qty 1

## 2021-07-02 NOTE — Assessment & Plan Note (Addendum)
Hemoglobin up at 11.3.  Ferritin elevated and TIBC low.

## 2021-07-02 NOTE — Progress Notes (Signed)
°  Progress Note   Patient: Paul Vaughan [id number] DOB: 1955/05/26 DOA: 06/30/2021     2 DOS: the patient was seen and examined on 07/02/2021   Assessment and Plan: * Sepsis ([place])- (present on admission) Clinical sepsis present on admission with COVID-19 virus infection and pneumonia.  Patient started on Paxlovid and Levaquin.  Fever of 102.9 on presentation with tachycardia.  May change Levaquin over to doxycycline today.  COVID-19 virus infection- (present on admission) Continue Paxlovid.  With CRP being elevated we will give a dose of Solu-Medrol.  CAP (community acquired pneumonia) Antibiotic switched to doxycycline.  Acute kidney injury superimposed on CKD ([place]) Acute kidney injury on chronic disease stage IIIa.  Creatinine 1.95 on presentation down to 1.49.   Anemia of chronic disease Hemoglobin stable at 9.6.  Ferritin elevated and TIBC low.  Thrombocytopenia ([place]) Likely secondary to sepsis.  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  Borderline intellectual disability Currently has a sitter        Subjective: Patient has some cough.  No shortness of breath.  Occasional diarrhea.  Patient lives in a group home and has a roommate.  Physical Exam: Vitals:   07/01/21 1600 07/01/21 2153 07/02/21 0542 07/02/21 1132  BP: (!) 106/54 (!) 108/47 104/72 (!) 148/59  Pulse: 76 89 83 85  Resp: 18 16 16 17   Temp: 98.5 F (36.9 C) 99.2 F (37.3 C) 98.7 F (37.1 C) 98.8 F (37.1 C)  TempSrc:   Oral   SpO2: 95% 94% 95% 97%  Weight:      Height:       Physical Exam HENT:     Head: Normocephalic.     Mouth/Throat:     Pharynx: No oropharyngeal exudate.  Eyes:     General: Lids are normal.     Conjunctiva/sclera: Conjunctivae normal.  Cardiovascular:     Rate and Rhythm: Normal rate and regular rhythm.     Heart sounds: Normal heart sounds,  S1 normal and S2 normal.  Pulmonary:     Breath sounds: No decreased breath sounds, wheezing, rhonchi or rales.  Abdominal:     Palpations: Abdomen is soft.     Tenderness: There is no abdominal tenderness.  Musculoskeletal:     Right lower leg: No swelling.     Left lower leg: No swelling.  Skin:    General: Skin is warm.     Findings: No rash.  Neurological:     Mental Status: He is alert.     Comments: Answer some simple yes/no questions appropriately     Data Reviewed: Hemoglobin 9.6, platelet count 116, ferritin 711, CRP elevated 14, creatinine down to 1.49 today  Family Communication: Spoke with group home administrator yesterday and given update  Disposition: Status is: Inpatient Remains inpatient appropriate because: Will need to be here in the hospital for 5 days with COVID isolation because he lives at a group home with a roommate.  Will need to mask for the other 5 days after that.  Planned Discharge Destination: Group home   Author: Loletha Grayer, MD 07/02/2021 12:02 PM  For on call review [email or web address].

## 2021-07-02 NOTE — Progress Notes (Signed)
PHARMACY NOTE:  ANTIMICROBIAL RENAL DOSAGE ADJUSTMENT  Current antimicrobial regimen includes a mismatch between antimicrobial dosage and estimated renal function.  As per policy approved by the Pharmacy & Therapeutics and Medical Executive Committees, the antimicrobial dosage will be adjusted accordingly.  Current antimicrobial dosage:  Levofloxacin 750 mg q48h   Indication: Pneumonia  Renal Function:  Estimated Creatinine Clearance: 53.5 mL/min (A) (by C-G formula based on SCr of 1.49 mg/dL (H)).  Antimicrobial dosage has been changed to:  Levofloxacin 750 mg q24h  Thank you for allowing pharmacy to be a part of this patient's care.  Tressie Ellis, [facility] 07/02/2021 8:02 AM

## 2021-07-03 DIAGNOSIS — J9601 Acute respiratory failure with hypoxia: Secondary | ICD-10-CM

## 2021-07-03 MED ORDER — IPRATROPIUM-ALBUTEROL 20-100 MCG/ACT IN AERS
2.0000 | INHALATION_SPRAY | Freq: Three times a day (TID) | RESPIRATORY_TRACT | Status: DC
  Administered 2021-07-03 – 2021-07-06 (×9): 2 via RESPIRATORY_TRACT
  Filled 2021-07-03: qty 4

## 2021-07-03 MED ORDER — SODIUM CHLORIDE 0.9 % IV SOLN
200.0000 mg | Freq: Once | INTRAVENOUS | Status: AC
  Administered 2021-07-03: 11:00:00 200 mg via INTRAVENOUS
  Filled 2021-07-03: qty 200

## 2021-07-03 MED ORDER — METHYLPREDNISOLONE SODIUM SUCC 40 MG IJ SOLR
40.0000 mg | Freq: Two times a day (BID) | INTRAMUSCULAR | Status: DC
  Administered 2021-07-03 – 2021-07-05 (×5): 40 mg via INTRAVENOUS
  Filled 2021-07-03 (×5): qty 1

## 2021-07-03 MED ORDER — SODIUM CHLORIDE 0.9 % IV SOLN
100.0000 mg | Freq: Every day | INTRAVENOUS | Status: DC
  Administered 2021-07-04 – 2021-07-06 (×3): 100 mg via INTRAVENOUS
  Filled 2021-07-03 (×3): qty 100

## 2021-07-03 MED ORDER — ASPIRIN 81 MG PO CHEW
81.0000 mg | CHEWABLE_TABLET | Freq: Every day | ORAL | Status: DC
  Administered 2021-07-03 – 2021-07-06 (×4): 81 mg via ORAL
  Filled 2021-07-03 (×4): qty 1

## 2021-07-03 NOTE — Progress Notes (Signed)
Patient placed on O2 via nasal cannula. Breath sounds audible in all lobes. Dr. Renae Gloss notified.

## 2021-07-03 NOTE — Consult Note (Signed)
Remdesivir - Pharmacy Brief Note   O:  ALT: 35 CXR: Ill-defined opacity within the medial right lung base, which may reflect atelectasis or pneumonia. SpO2: 90% on 3L [place]    A/P:  Remdesivir 200 mg IVPB once followed by 100 mg IVPB daily x 4 days.   Derrek Gu, PharmD  07/03/2021 7:46 AM

## 2021-07-03 NOTE — Progress Notes (Signed)
°  Progress Note   Patient: Paul Vaughan [id number] DOB: Oct 28, 1955 DOA: 06/30/2021     3 DOS: the patient was seen and examined on 07/03/2021    Assessment and Plan: * Acute hypoxemic respiratory failure due to COVID-19 (HCC) Pulse ox of 82% on room air this morning.  Patient placed on 3 L of oxygen.  Incentive spirometer ordered.  Discontinue Paxlovid and started remdesivir.  On Solu-Medrol since yesterday.  Check inflammatory labs tomorrow.  Would like to get off oxygen prior to disposition.  Sepsis ([place])- (present on admission) Clinical sepsis present on admission with COVID-19 virus infection and pneumonia.  Changed over to remdesivir, Solu-Medrol and doxycycline.  CAP (community acquired pneumonia) Continue doxycycline.  Acute kidney injury superimposed on CKD ([place]) Acute kidney injury on chronic disease stage IIIa.  Creatinine 1.95 on presentation down to 1.49.   Anemia of chronic disease Hemoglobin stable at 9.6.  Ferritin elevated and TIBC low.  Thrombocytopenia ([place]) Likely secondary to sepsis.  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  Borderline intellectual disability Sitter discontinued.        Subjective: Patient seen sitting up in the chair today.  Some cough.  Found to have a low pulse ox early morning.  Has some shortness of breath.  Admitted with COVID-19 pneumonia and sepsis.  Physical Exam: Vitals:   07/03/21 0530 07/03/21 0726 07/03/21 0731 07/03/21 0817  BP: 100/66   112/62  Pulse: 66   95  Resp: 17   15  Temp: 98 F (36.7 C)   98 F (36.7 C)  TempSrc: Oral   Oral  SpO2: (!) 83% (!) 83% 90% 96%  Weight:      Height:       Physical Exam HENT:     Head: Normocephalic.     Mouth/Throat:     Pharynx: No oropharyngeal exudate.  Eyes:     General: Lids are normal.     Conjunctiva/sclera: Conjunctivae normal.   Cardiovascular:     Rate and Rhythm: Normal rate and regular rhythm.     Heart sounds: Normal heart sounds, S1 normal and S2 normal.  Pulmonary:     Breath sounds: Examination of the right-lower field reveals decreased breath sounds and rhonchi. Examination of the left-lower field reveals decreased breath sounds and rhonchi. Decreased breath sounds and rhonchi present. No wheezing or rales.  Abdominal:     Palpations: Abdomen is soft.     Tenderness: There is no abdominal tenderness.  Musculoskeletal:     Right lower leg: No swelling.     Left lower leg: No swelling.  Skin:    General: Skin is warm.     Findings: No rash.  Neurological:     Mental Status: He is alert.     Comments: Answer some simple yes/no questions appropriately     Data Reviewed: Low pulse ox of 83%.  Family Communication: Spoke with group home owner  Disposition: Status is: Inpatient Remains inpatient appropriate because: Now required oxygen for pulse ox of 83% on room air  Planned Discharge Destination: Group home  Author: Loletha Grayer, MD 07/03/2021 12:13 PM  For on call review [email or web address].

## 2021-07-03 NOTE — Progress Notes (Signed)
Physical Therapy Treatment Patient Details Name: Paul Vaughan MRN: [id number] DOB: [birth year] Today's Date: 07/03/2021   History of Present Illness Patient is a 66 year old male who presented to ED with weakness and limited ability to ambulate. Patient lives at [facility]. PMH includes dementia, CKD stage IIIa, HTN. He was admitted with COVID and sepsis.    PT Comments    Patient tolerated session well and was agreeable to treatment. Upon arrival patient was seated in recliner finishing his lunch. Patient reported no pain throughout session. At beginning of session patient was on 2L O2 via [place]. During ambulation bouts patient completed on room air with O2 remaining >90%. Patient completed 2 bouts of ambulation at [facility] with no AD from recliner to doorway with 2 mild LOB occurring that required Min A to correct. Patient is able to complete sit to stands from recliner at supervision with no AD. X10 more completed for strengthening. Therapeutic exercises in recliner focused on BLE strengthening. Patient required intermittent redirection back to task as he was captivated by the [place] outside his window. Patient left in recliner with all needs met. Patient is progressing nicely towards his goals and would continue to benefit from skilled physical therapy in order to optimize patient's return to PLOF.     Recommendations for follow up therapy are one component of a multi-disciplinary discharge planning process, led by the attending physician.  Recommendations may be updated based on patient status, additional functional criteria and insurance authorization.  Follow Up Recommendations  Home health PT     Assistance Recommended at Discharge Frequent or constant Supervision/Assistance  Patient can return home with the following A little help with walking and/or transfers;A little help with bathing/dressing/bathroom;Assistance with cooking/housework;Direct supervision/assist for medications  management;Assist for transportation;Help with stairs or ramp for entrance;Direct supervision/assist for financial management   Equipment Recommendations  Rolling walker (2 wheels)    Recommendations for Other Services       Precautions / Restrictions Precautions Precautions: Fall Restrictions Weight Bearing Restrictions: No     Mobility  Bed Mobility Overal bed mobility:  (not assessed as patient started and ended session in recliner)                  Transfers Overall transfer level: Needs assistance Equipment used: None Transfers: Sit to/from Stand Sit to Stand: Supervision                Ambulation/Gait Ambulation/Gait assistance: Min guard Gait Distance (Feet): 80 Feet (2x39f from recliner to doorway) Assistive device: None Gait Pattern/deviations: Step-through pattern, Staggering left, Narrow base of support Gait velocity: decreased     General Gait Details: Patient demonstrates mild unsteadiness and 2 mild LOB that required Min A to correct (Staggered L)   Stairs             Wheelchair Mobility    Modified Rankin (Stroke Patients Only)       Balance Overall balance assessment: Needs assistance Sitting-balance support: Feet supported Sitting balance-Leahy Scale: Good Sitting balance - Comments: able to static sit without LOB   Standing balance support: No upper extremity supported, During functional activity Standing balance-Leahy Scale: Fair Standing balance comment: mild unsteadiness when ambulating without AD                            Cognition Arousal/Alertness: Awake/alert Behavior During Therapy: Flat affect Overall Cognitive Status: No family/caregiver present to determine baseline cognitive functioning  General Comments: Patient was alert and orientedx3 self, situation, location        Exercises General Exercises - Lower Extremity Ankle Circles/Pumps:  Seated, 20 reps, AROM, Both Long Arc Quad: Seated, 20 reps, AROM, Both Hip Flexion/Marching: Seated, 20 reps, AROM, Both Other Exercises Other Exercises: x10 sit to stands with no AD from recliner, completed supervision    General Comments General comments (skin integrity, edema, etc.): Patient was able to ambulate to doorway and back from recliner twice while on room air, SpO2 remined >90% during bouts of ambulation      Pertinent Vitals/Pain Pain Assessment Pain Assessment: No/denies pain    Home Living                          Prior Function            PT Goals (current goals can now be found in the care plan section) Acute Rehab PT Goals PT Goal Formulation: Patient unable to participate in goal setting    Frequency    Min 2X/week      PT Plan Current plan remains appropriate    Co-evaluation              AM-PAC PT "6 Clicks" Mobility   Outcome Measure  Help needed turning from your back to your side while in a flat bed without using bedrails?: None Help needed moving from lying on your back to sitting on the side of a flat bed without using bedrails?: A Little Help needed moving to and from a bed to a chair (including a wheelchair)?: A Little Help needed standing up from a chair using your arms (e.g., wheelchair or bedside chair)?: None Help needed to walk in hospital room?: A Little Help needed climbing 3-5 steps with a railing? : A Lot 6 Click Score: 19    End of Session Equipment Utilized During Treatment: Gait belt;Oxygen Activity Tolerance: Patient tolerated treatment well Patient left: in chair;with call bell/phone within reach;with chair alarm set Nurse Communication: Mobility status PT Visit Diagnosis: Unsteadiness on feet (R26.81);Other abnormalities of gait and mobility (R26.89);Muscle weakness (generalized) (M62.81);Difficulty in walking, not elsewhere classified (R26.2)     Time: 5498-2641 PT Time Calculation (min) (ACUTE  ONLY): 19 min  Charges:  $Therapeutic Exercise: 8-22 mins                     Iva Boop, PT  07/03/21. 2:02 PM

## 2021-07-04 LAB — COMPREHENSIVE METABOLIC PANEL
ALT: 37 U/L (ref 0–44)
AST: 51 U/L — ABNORMAL HIGH (ref 15–41)
Albumin: 3.1 g/dL — ABNORMAL LOW (ref 3.5–5.0)
Alkaline Phosphatase: 49 U/L (ref 38–126)
Anion gap: 12 (ref 5–15)
BUN: 41 mg/dL — ABNORMAL HIGH (ref 8–23)
CO2: 23 mmol/L (ref 22–32)
Calcium: 9.1 mg/dL (ref 8.9–10.3)
Chloride: 102 mmol/L (ref 98–111)
Creatinine, Ser: 1.34 mg/dL — ABNORMAL HIGH (ref 0.61–1.24)
GFR, Estimated: 58 mL/min — ABNORMAL LOW (ref >60)
Glucose, Bld: 139 mg/dL — ABNORMAL HIGH (ref 70–99)
Potassium: 4.2 mmol/L (ref 3.5–5.1)
Sodium: 137 mmol/L (ref 135–145)
Total Bilirubin: 0.5 mg/dL (ref 0.3–1.2)
Total Protein: 7.2 g/dL (ref 6.5–8.1)

## 2021-07-04 LAB — CBC
HCT: 32.6 % — ABNORMAL LOW (ref 39.0–52.0)
Hemoglobin: 11.3 g/dL — ABNORMAL LOW (ref 13.0–17.0)
MCH: 32.6 pg (ref 26.0–34.0)
MCHC: 34.7 g/dL (ref 30.0–36.0)
MCV: 93.9 fL (ref 80.0–100.0)
Platelets: 141 10*3/uL — ABNORMAL LOW (ref 150–400)
RBC: 3.47 MIL/uL — ABNORMAL LOW (ref 4.22–5.81)
RDW: 14 % (ref 11.5–15.5)
WBC: 12.3 10*3/uL — ABNORMAL HIGH (ref 4.0–10.5)
nRBC: 0 % (ref 0.0–0.2)

## 2021-07-04 LAB — FERRITIN: Ferritin: 720 ng/mL — ABNORMAL HIGH (ref 24–336)

## 2021-07-04 LAB — C-REACTIVE PROTEIN: CRP: 8.2 mg/dL — ABNORMAL HIGH (ref <1.0)

## 2021-07-04 LAB — D-DIMER, QUANTITATIVE: D-Dimer, Quant: 0.92 ug/mL-FEU — ABNORMAL HIGH (ref 0.00–0.50)

## 2021-07-04 NOTE — Progress Notes (Signed)
Mobility Specialist - Progress Note   07/04/21 1636  Mobility  Activity Ambulated with assistance in room  Level of Assistance Contact guard assist, steadying assist  Assistive Device None  Distance Ambulated (ft) 100 ft  Activity Response Tolerated well  $Mobility charge 1 Mobility    Pre-mobility: 71 HR, 94% SpO2   Pt lying in bed upon arrival, utilizing RA. NT at bedside obtaining VS. Pt ambulated in room, staggers L especially during turns---requiring CGA. Pt voiced dizziness during ambulation, but then states post-session "I wasn't really dizzy, I was just having a hard time keeping my balance." Further ambulation deferred d/t pt's request to sit. Noted B knee buckle x1. O2 maintained 90s. Pt left in bed with alarm set and needs in reach. RN notified.    Filiberto Pinks Mobility Specialist 07/04/21, 4:40 PM

## 2021-07-04 NOTE — Plan of Care (Signed)
RN obtained vitals this am to ensure oxygen saturation WNL. Sats 95% on room air. Pt acheieved 1500 on IS. Attempted at HS pt was too sleepy to follow directions.  Problem: Education: Goal: Knowledge of General Education information will improve Description: Including pain rating scale, medication(s)/side effects and non-pharmacologic comfort measures Outcome: Progressing   Problem: Health Behavior/Discharge Planning: Goal: Ability to manage health-related needs will improve Outcome: Progressing   Problem: Clinical Measurements: Goal: Ability to maintain clinical measurements within normal limits will improve Outcome: Progressing Goal: Will remain free from infection Outcome: Progressing Goal: Diagnostic test results will improve Outcome: Progressing Goal: Respiratory complications will improve Outcome: Progressing Goal: Cardiovascular complication will be avoided Outcome: Progressing   Problem: Activity: Goal: Risk for activity intolerance will decrease Outcome: Progressing   Problem: Nutrition: Goal: Adequate nutrition will be maintained Outcome: Progressing   Problem: Coping: Goal: Level of anxiety will decrease Outcome: Progressing   Problem: Elimination: Goal: Will not experience complications related to bowel motility Outcome: Progressing Goal: Will not experience complications related to urinary retention Outcome: Progressing   Problem: Pain Managment: Goal: General experience of comfort will improve Outcome: Progressing   Problem: Safety: Goal: Ability to remain free from injury will improve Outcome: Progressing   Problem: Skin Integrity: Goal: Risk for impaired skin integrity will decrease Outcome: Progressing

## 2021-07-04 NOTE — Hospital Course (Addendum)
66 year old man came to the hospital with weakness and fever and found to have COVID-19 sepsis and pneumonia.  Patient currently lives in a group home with a roommate.  He has adjustment disorder and intellectual disability.  Initially the patient was started on Paxlovid but then became hypoxic and had a high CRP.  Steroids were added and Paxlovid was switched over to remdesivir.  Patient also on doxycycline  On 07/04/2021 the patient was able to come off oxygen.

## 2021-07-04 NOTE — Progress Notes (Signed)
Progress Note   Patient: Paul Vaughan [id number] DOB: January 26, 1956 DOA: 06/30/2021     4 DOS: the patient was seen and examined on 07/04/2021   Brief hospital course: 66 year old man came to the hospital with weakness and fever and found to have COVID-19 sepsis and pneumonia.  Patient currently lives in a group home with a roommate.  He has adjustment disorder and intellectual disability.  Initially the patient was started on Paxlovid but then became hypoxic and had a high CRP.  Steroids were added and Paxlovid was switched over to remdesivir.  Patient also on doxycycline  On 07/04/2021 the patient was able to come off oxygen.  Assessment and Plan: * Acute hypoxemic respiratory failure due to COVID-19 (HCC) Pulse ox of 82% on room air on 07/03/2021.  Patient placed on 3 L of oxygen.  Incentive spirometer ordered.  On 07/03/2021 Paxlovid discontinued and started remdesivir.  Continue Solu-Medrol.  Patient off oxygen today.  CRP trended better from 14.0 down to 8.2.  Sepsis (HCC)- (present on admission) Clinical sepsis present on admission with COVID-19 virus infection and pneumonia.  Continue remdesivir (day 2), Solu-Medrol and doxycycline.  CAP (community acquired pneumonia) Continue doxycycline.  Acute kidney injury superimposed on CKD (HCC) Acute kidney injury on chronic disease stage IIIa.  Creatinine 1.95 on presentation down to 1.34.   Anemia of chronic disease Hemoglobin up at 11.3.  Ferritin elevated and TIBC low.  Thrombocytopenia (HCC) Likely secondary to sepsis.  Platelet count up to 141.  Adjustment disorder with mixed disturbance of emotions and conduct- (present on admission) Patient on Lexapro, Depakote, gabapentin, Seroquel.  Borderline intellectual disability Sitter discontinued.        Subjective: Patient feeling better today.  Able to come off oxygen.  Using the incentive spirometer.  Still has a little cough and chest congestion.  Admitted with sepsis  and COVID-19 pneumonia  Physical Exam: Vitals:   07/04/21 0427 07/04/21 0533 07/04/21 0825 07/04/21 1223  BP: (!) 143/81 (!) 133/95 118/80 (!) 159/97  Pulse: 91 84 88 61  Resp: 19 18 18    Temp: 97.8 F (36.6 C) 97.7 F (36.5 C) 97.6 F (36.4 C) 97.8 F (36.6 C)  TempSrc: Oral  Oral Oral  SpO2: 94% 95% 95% 95%  Weight:      Height:       Physical Exam HENT:     Head: Normocephalic.     Mouth/Throat:     Pharynx: No oropharyngeal exudate.  Eyes:     General: Lids are normal.     Conjunctiva/sclera: Conjunctivae normal.  Cardiovascular:     Rate and Rhythm: Normal rate and regular rhythm.     Heart sounds: Normal heart sounds, S1 normal and S2 normal.  Pulmonary:     Breath sounds: Examination of the right-lower field reveals decreased breath sounds. Examination of the left-lower field reveals decreased breath sounds. Decreased breath sounds present. No wheezing, rhonchi or rales.  Abdominal:     Palpations: Abdomen is soft.     Tenderness: There is no abdominal tenderness.  Musculoskeletal:     Right lower leg: No swelling.     Left lower leg: No swelling.  Skin:    General: Skin is warm.     Findings: No rash.  Neurological:     Mental Status: He is alert.     Comments: Answer some simple yes/no questions appropriately     Data Reviewed: Hemoglobin up to 11.3, CRP down to 8.2, ferritin elevated at 720, platelet count  up at 141, creatinine down to 1.34.  Family Communication: Spoke with group home owner  Disposition: Status is: Inpatient Remains inpatient appropriate because: Switched over to remdesivir yesterday and still requiring steroids.  Planned Discharge Destination: Group home  Case discussed at rounds today with [facility] and nursing staff.  Author: Loletha Grayer, MD 07/04/2021 2:30 PM  For on call review [email or web address].

## 2021-07-04 NOTE — TOC Progression Note (Signed)
Transition of Care [facility]) - Progression Note    Patient Details  Name: Paul Vaughan MRN: [id number] Date of Birth: 1955-06-30  [facility]) CM/SW Contact  Eileen Stanford, LCSW Phone Number: 07/04/2021, 4:08 PM  Clinical Narrative:   CSW called [name] at [facility] and states pt could potentially dc back tomorrow depending on the attending. CSW to follow up with [facility] tomorrow.  Expected Discharge Plan: Group Home Barriers to Discharge: Continued Medical Work up  Expected Discharge Plan and Services Expected Discharge Plan: Group Home       Living arrangements for the past 2 months: Group Home                                       Social Determinants of Health (SDOH) Interventions    Readmission Risk Interventions Readmission Risk Prevention Plan 07/01/2021  Transportation Screening Complete  PCP or Specialist Appt within 5-7 Days Complete  Home Care Screening Complete  Medication Review (RN CM) Complete  Some recent data might be hidden

## 2021-07-05 LAB — CBC
HCT: 34.4 % — ABNORMAL LOW (ref 39.0–52.0)
Hemoglobin: 11.8 g/dL — ABNORMAL LOW (ref 13.0–17.0)
MCH: 32.5 pg (ref 26.0–34.0)
MCHC: 34.3 g/dL (ref 30.0–36.0)
MCV: 94.8 fL (ref 80.0–100.0)
Platelets: 181 10*3/uL (ref 150–400)
RBC: 3.63 MIL/uL — ABNORMAL LOW (ref 4.22–5.81)
RDW: 14.2 % (ref 11.5–15.5)
WBC: 14.2 10*3/uL — ABNORMAL HIGH (ref 4.0–10.5)
nRBC: 0 % (ref 0.0–0.2)

## 2021-07-05 LAB — BASIC METABOLIC PANEL
Anion gap: 11 (ref 5–15)
BUN: 47 mg/dL — ABNORMAL HIGH (ref 8–23)
CO2: 25 mmol/L (ref 22–32)
Calcium: 9 mg/dL (ref 8.9–10.3)
Chloride: 102 mmol/L (ref 98–111)
Creatinine, Ser: 1.23 mg/dL (ref 0.61–1.24)
GFR, Estimated: >60 mL/min (ref >60)
Glucose, Bld: 138 mg/dL — ABNORMAL HIGH (ref 70–99)
Potassium: 4.5 mmol/L (ref 3.5–5.1)
Sodium: 138 mmol/L (ref 135–145)

## 2021-07-05 LAB — CULTURE, BLOOD (ROUTINE X 2)
Culture: NO GROWTH
Culture: NO GROWTH
Special Requests: ADEQUATE
Special Requests: ADEQUATE

## 2021-07-05 LAB — C-REACTIVE PROTEIN: CRP: 4.3 mg/dL — ABNORMAL HIGH (ref <1.0)

## 2021-07-05 MED ORDER — METHYLPREDNISOLONE SODIUM SUCC 40 MG IJ SOLR
40.0000 mg | INTRAMUSCULAR | Status: DC
Start: 2021-07-06 — End: 2021-07-06
  Administered 2021-07-06: 08:00:00 40 mg via INTRAVENOUS
  Filled 2021-07-05: qty 1

## 2021-07-05 NOTE — Progress Notes (Signed)
PROGRESS NOTE    Paul Vaughan  PVX:[id number] DOB: 10/03/1955 DOA: 06/30/2021 PCP: Gae Bon, NP   Assessment & Plan:   Principal Problem:   Acute hypoxemic respiratory failure due to COVID-19 [facility]) Active Problems:   Borderline intellectual disability   Adjustment disorder with mixed disturbance of emotions and conduct   Sepsis ([place])   CAP (community acquired pneumonia)   Thrombocytopenia ([place])   Anemia of chronic disease   Acute kidney injury superimposed on CKD ([place])   Acute hypoxemic respiratory failure: secondary to COVID-19. SaO2 of 82% on room air on 07/03/2021. Continue on supplemental oxygen and wean as tolerated. Continue on remdesivir, steroids. Encourage incentive spirometry   Sepsis: present on admission. See Dr. Marshia Ly note on how pt met sepsis criteria. Continue on remdesivir, steroids    CAP: possibly superimposed bacterial infection. Continue on doxycycline    AKI on CKDIIIa: Cr is trending down from day prior. Avoid nephrotoxic meds   Anemia of chronic disease: H&H are stable. No need for a transfusion currently     Thrombocytopenia: resolved .   Adjustment disorder:  with mixed disturbance of emotions and conduct. Continue on home dose of lexapro, depakote, gabapentin & seroquel    Borderline intellectual disability: continue w/ supportive care      DVT prophylaxis: lovenox  Code Status: full  Family Communication: Disposition Plan: d/c back to group home tomorrow   Level of care: Telemetry Medical  Status is: Inpatient Remains inpatient appropriate because: to finish treatment of remdesivir, will d/c back to group home tomorrow     Consultants:    Procedures:   Antimicrobials:   Subjective: Pt c/o weakness  Objective: Vitals:   07/04/21 2032 07/05/21 0545 07/05/21 0748 07/05/21 0749  BP: (!) 157/90 135/62 135/64 135/64  Pulse: 82 85 76 76  Resp: _0 Temp: 97.6 F (36.4 C) 97.7 F (36.5 C) (!) 97.5 F  (36.4 C) (!) 97.5 F (36.4 C)  TempSrc: Oral Oral Oral Oral  SpO2: 92% 97% 95% 96%  Weight:      Height:        Intake/Output Summary (Last 24 hours) at 07/05/2021 0836 Last data filed at 07/05/2021 4827 Gross per 24 hour  Intake 100 ml  Output 800 ml  Net -700 ml   Filed Weights   06/30/21 0843 06/30/21 2033  Weight: 68 kg 83.8 kg    Examination:  General exam: Appears calm and comfortable  Respiratory system: decreased breath sounds b/l. Respiratory effort normal. Cardiovascular system: S1 & S2 +. No rubs, gallops or clicks. Gastrointestinal system: Abdomen is nondistended, soft and nontender. Normal bowel sounds heard. Central nervous system: Alert and oriented. Moves all extremities  Psychiatry: Judgement and insight appear at baseline. Flat mood and affect     Data Reviewed: I have personally reviewed following labs and imaging studies  CBC: Recent Labs  Lab 06/30/21 0900 07/01/21 0601 07/02/21 0541 07/04/21 0546 07/05/21 0613  WBC 10.0 6.7 6.0 12.3* 14.2*  NEUTROABS 6.8  --   --   --   --   HGB 12.1* 9.4* 9.6* 11.3* 11.8*  HCT 36.6* 27.5* 27.7* 32.6* 34.4*  MCV 97.3 94.5 93.0 93.9 94.8  PLT 162 120* 116* 141* 078   Basic Metabolic Panel: Recent Labs  Lab 06/30/21 0900 07/01/21 0601 07/02/21 0541 07/04/21 0546 07/05/21 0613  NA 133* 134* 137 137 138  K 4.6 3.8 3.7 4.2 4.5  CL 101 103 103 102 102  CO2 21*  _0 GLUCOSE 96 95 89 139* 138*  BUN 36* 29* 24* 41* 47*  CREATININE 1.95* 1.65* 1.49* 1.34* 1.23  CALCIUM 9.4 8.5* 8.5* 9.1 9.0   GFR: Estimated Creatinine Clearance: 64.8 mL/min (by C-G formula based on SCr of 1.23 mg/dL). Liver Function Tests: Recent Labs  Lab 06/30/21 0900 07/04/21 0546  AST 74* 51*  ALT 35 37  ALKPHOS 42 49  BILITOT 0.8 0.5  PROT 7.8 7.2  ALBUMIN 3.8 3.1*   No results for input(s): LIPASE, AMYLASE in the last 168 hours. No results for input(s): AMMONIA in the last 168 hours. Coagulation Profile: Recent  Labs  Lab 06/30/21 0900 07/01/21 0601  INR 1.1 1.2   Cardiac Enzymes: No results for input(s): CKTOTAL, CKMB, CKMBINDEX, TROPONINI in the last 168 hours. BNP (last 3 results) No results for input(s): PROBNP in the last 8760 hours. HbA1C: No results for input(s): HGBA1C in the last 72 hours. CBG: No results for input(s): GLUCAP in the last 168 hours. Lipid Profile: No results for input(s): CHOL, HDL, LDLCALC, TRIG, CHOLHDL, LDLDIRECT in the last 72 hours. Thyroid Function Tests: No results for input(s): TSH, T4TOTAL, FREET4, T3FREE, THYROIDAB in the last 72 hours. Anemia Panel: Recent Labs    07/04/21 0546  FERRITIN 720*   Sepsis Labs: Recent Labs  Lab 06/30/21 0900 06/30/21 1046 06/30/21 1728 07/01/21 0601 07/02/21 0541  PROCALCITON  --   --  0.46 0.52 0.35  LATICACIDVEN 2.4* 1.6  --   --   --     Recent Results (from the past 240 hour(s))  Resp Panel by RT-PCR (Flu A&B, Covid) Nasopharyngeal Swab     Status: Abnormal   Collection Time: 06/30/21  9:00 AM   Specimen: Nasopharyngeal Swab; Nasopharyngeal(NP) swabs in vial transport medium  Result Value Ref Range Status   SARS Coronavirus 2 by RT PCR POSITIVE (A) NEGATIVE Final    Comment: (NOTE) SARS-CoV-2 target nucleic acids are DETECTED.  The SARS-CoV-2 RNA is generally detectable in upper respiratory specimens during the acute phase of infection. Positive results are indicative of the presence of the identified virus, but do not rule out bacterial infection or co-infection with other pathogens not detected by the test. Clinical correlation with patient history and other diagnostic information is necessary to determine patient infection status. The expected result is Negative.  Fact Sheet for Patients: EntrepreneurPulse.com.au  Fact Sheet for Healthcare Providers: IncredibleEmployment.be  This test is not yet approved or cleared by the [place] FDA and  has been  authorized for detection and/or diagnosis of SARS-CoV-2 by FDA under an Emergency Use Authorization (EUA).  This EUA will remain in effect (meaning this test can be used) for the duration of  the COVID-19 declaration under Section 564(b)(1) of the A ct, 21 U.S.C. section 360bbb-3(b)(1), unless the authorization is terminated or revoked sooner.     Influenza A by PCR NEGATIVE NEGATIVE Final   Influenza B by PCR NEGATIVE NEGATIVE Final    Comment: (NOTE) The Xpert Xpress SARS-CoV-2/FLU/RSV plus assay is intended as an aid in the diagnosis of influenza from Nasopharyngeal swab specimens and should not be used as a sole basis for treatment. Nasal washings and aspirates are unacceptable for Xpert Xpress SARS-CoV-2/FLU/RSV testing.  Fact Sheet for Patients: EntrepreneurPulse.com.au  Fact Sheet for Healthcare Providers: IncredibleEmployment.be  This test is not yet approved or cleared by the [place] FDA and has been authorized for detection and/or diagnosis of SARS-CoV-2 by FDA under an Emergency Use  Authorization (EUA). This EUA will remain in effect (meaning this test can be used) for the duration of the COVID-19 declaration under Section 564(b)(1) of the Act, 21 U.S.C. section 360bbb-3(b)(1), unless the authorization is terminated or revoked.  Performed at [facility], [address] [place], [place] [address]   Blood Culture (routine x 2)     Status: None   Collection Time: 06/30/21  9:00 AM   Specimen: BLOOD RIGHT HAND  Result Value Ref Range Status   Specimen Description BLOOD RIGHT HAND  Final   Special Requests   Final    BOTTLES DRAWN AEROBIC AND ANAEROBIC Blood Culture adequate volume   Culture   Final    NO GROWTH 5 DAYS Performed at [facility], [address] [place], [place] [address]    Report Status 07/05/2021 FINAL  Final  Blood Culture (routine x 2)     Status: None   Collection Time: 06/30/21   9:00 AM   Specimen: BLOOD  Result Value Ref Range Status   Specimen Description BLOOD BLOOD LEFT HAND  Final   Special Requests   Final    BOTTLES DRAWN AEROBIC AND ANAEROBIC Blood Culture adequate volume   Culture   Final    NO GROWTH 5 DAYS Performed at [facility], [address], [place], [place] [address]    Report Status 07/05/2021 FINAL  Final  Urine Culture     Status: Abnormal   Collection Time: 06/30/21 10:46 AM   Specimen: In/Out Cath Urine  Result Value Ref Range Status   Specimen Description   Final    IN/OUT CATH URINE Performed at [facility], [address] [place], [place] [address]    Special Requests   Final    NONE Performed at [facility], [address] [place], [place] [address]    Culture MULTIPLE SPECIES PRESENT, SUGGEST RECOLLECTION (A)  Final   Report Status 07/02/2021 FINAL  Final         Radiology Studies: No results found.      Scheduled Meds:  vitamin C  500 mg Oral Daily   aspirin  81 mg Oral Daily   divalproex  1,000 mg Oral QHS   divalproex  500 mg Oral Daily   doxycycline  100 mg Oral Q12H   enoxaparin (LOVENOX) injection  40 mg Subcutaneous Q24H   escitalopram  5 mg Oral QHS   folic acid  1 mg Oral [id number]   gabapentin  100 mg Oral BID   gabapentin  300 mg Oral QHS   Ipratropium-Albuterol  2 puff Inhalation TID   memantine  5 mg Oral QHS   methylPREDNISolone (SOLU-MEDROL) injection  40 mg Intravenous Q12H   pyridOXINE  100 mg Oral QHS   QUEtiapine  100 mg Oral Q1400   QUEtiapine  150 mg Oral QHS   QUEtiapine  50 mg Oral Q breakfast   thiamine  100 mg Oral Q1200   zinc sulfate  220 mg Oral Daily   Continuous Infusions:  remdesivir 100 mg in NS 100 mL Stopped (07/04/21 1022)     LOS: 5 days    Time spent: 30 mins    Wyvonnia Dusky, MD [facility] Pager [phone]  If 7PM-7AM, please contact night-coverage 07/05/2021, 8:36 AM

## 2021-07-05 NOTE — NC FL2 (Signed)
[place] LEVEL OF CARE SCREENING TOOL     IDENTIFICATION  Patient Name: Paul Vaughan Birthdate: 08/11/1955 Sex: male Admission Date (Current Location): 06/30/2021  [facility] and [place] Number:  Engineering geologist and Address:  [facility], [address]  [place], [place] [address]      Provider Number: [id number]  Attending Physician Name and Address:  Wyvonnia Dusky, MD  Relative Name and Phone Number:       Current Level of Care: Hospital Recommended Level of Care: [facility] Prior Approval Number:    Date Approved/Denied:   PASRR Number:    Discharge Plan: Other (Comment) (group home)    Current Diagnoses: Patient Active Problem List   Diagnosis Date Noted   Thrombocytopenia ([place]) 07/01/2021   Anemia of chronic disease 07/01/2021   Acute kidney injury superimposed on CKD ([place]) 07/01/2021   Sepsis ([place]) 06/30/2021   Acute hypoxemic respiratory failure due to COVID-19 ([place]) 06/30/2021   CAP (community acquired pneumonia) 06/30/2021   Borderline intellectual disability 09/08/2015   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    Orientation RESPIRATION BLADDER Height & Weight     Self  Normal Incontinent Weight: 184 lb 11.9 oz (83.8 kg) Height:  6' (182.9 cm)  BEHAVIORAL SYMPTOMS/MOOD NEUROLOGICAL BOWEL NUTRITION STATUS      Continent Diet (regular diet)  AMBULATORY STATUS COMMUNICATION OF NEEDS Skin   Limited Assist Verbally Normal                       Personal Care Assistance Level of Assistance  Bathing, Feeding, Dressing Bathing Assistance: Limited assistance Feeding assistance: Independent Dressing Assistance: Limited assistance     Functional Limitations Info  Sight, Hearing, Speech Sight Info: Adequate Hearing Info: Adequate Speech Info: Adequate    SPECIAL CARE FACTORS FREQUENCY                       Contractures Contractures Info: Not  present    Additional Factors Info  Code Status, Allergies Code Status Info: full code Allergies Info: Penicillins, Sulfa Antibiotics           Current Medications (07/05/2021):  This is the current hospital active medication list Current Facility-Administered Medications  Medication Dose Route Frequency Provider Last Rate Last Admin   acetaminophen (TYLENOL) tablet 650 mg  650 mg Oral Q6H PRN Agbata, Tochukwu, MD       Or   acetaminophen (TYLENOL) suppository 650 mg  650 mg Rectal Q6H PRN Agbata, Tochukwu, MD       ascorbic acid (VITAMIN C) tablet 500 mg  500 mg Oral Daily Agbata, Tochukwu, MD   500 mg at 07/05/21 1011   aspirin chewable tablet 81 mg  81 mg Oral Daily Darnelle Bos, RPH   81 mg at 07/05/21 1011   divalproex (DEPAKOTE ER) 24 hr tablet 1,000 mg  1,000 mg Oral QHS Agbata, Tochukwu, MD   1,000 mg at 07/04/21 2208   divalproex (DEPAKOTE ER) 24 hr tablet 500 mg  500 mg Oral Daily Agbata, Tochukwu, MD   500 mg at 07/05/21 1107   doxycycline (VIBRA-TABS) tablet 100 mg  100 mg Oral Q12H Wieting, Richard, MD   100 mg at 07/05/21 1011   enoxaparin (LOVENOX) injection 40 mg  40 mg Subcutaneous Q24H Agbata, Tochukwu, MD   40 mg at 07/04/21 2206   escitalopram (LEXAPRO) tablet 5 mg  5 mg Oral QHS  Collier Bullock, MD   5 mg at [id number]   folic acid (FOLVITE) tablet 1 mg  1 mg Oral Q1200 Agbata, Tochukwu, MD   1 mg at 07/05/21 1011   gabapentin (NEURONTIN) capsule 100 mg  100 mg Oral BID Agbata, Tochukwu, MD   100 mg at 07/05/21 1508   gabapentin (NEURONTIN) capsule 300 mg  300 mg Oral QHS Agbata, Tochukwu, MD   300 mg at 07/04/21 2205   guaiFENesin-dextromethorphan (ROBITUSSIN DM) 100-10 MG/5ML syrup 10 mL  10 mL Oral Q4H PRN Agbata, Tochukwu, MD       Ipratropium-Albuterol (COMBIVENT) respimat 2 puff  2 puff Inhalation TID Loletha Grayer, MD   2 puff at 07/05/21 1508   memantine (NAMENDA) tablet 5 mg  5 mg Oral QHS Agbata, Tochukwu, MD   5 mg at 07/04/21 2205   [START ON  07/06/2021] methylPREDNISolone sodium succinate (SOLU-MEDROL) 40 mg/mL injection 40 mg  40 mg Intravenous Q24H Wyvonnia Dusky, MD       ondansetron [facility]) tablet 4 mg  4 mg Oral Q6H PRN Agbata, Tochukwu, MD       Or   ondansetron (ZOFRAN) injection 4 mg  4 mg Intravenous Q6H PRN Agbata, Tochukwu, MD   4 mg at 07/02/21 1722   pyridOXINE (VITAMIN B-6) tablet 100 mg  100 mg Oral QHS Agbata, Tochukwu, MD   100 mg at 07/04/21 2208   QUEtiapine (SEROQUEL) tablet 100 mg  100 mg Oral Q1400 Agbata, Tochukwu, MD   100 mg at 07/05/21 1508   QUEtiapine (SEROQUEL) tablet 150 mg  150 mg Oral QHS Agbata, Tochukwu, MD   150 mg at 07/04/21 2208   QUEtiapine (SEROQUEL) tablet 50 mg  50 mg Oral Q breakfast Agbata, Tochukwu, MD   50 mg at 07/05/21 1011   remdesivir 100 mg in sodium chloride 0.9 % 100 mL IVPB  100 mg Intravenous Daily Darnelle Bos, RPH 200 mL/hr at 07/05/21 1108 100 mg at 07/05/21 1108   thiamine tablet 100 mg  100 mg Oral Q1200 Agbata, Tochukwu, MD   100 mg at 07/05/21 1116   zinc sulfate capsule 220 mg  220 mg Oral Daily Agbata, Tochukwu, MD   220 mg at 07/05/21 1011     Discharge Medications: Please see discharge summary for a list of discharge medications.  Relevant Imaging Results:  Relevant Lab Results:   Additional Information SSN:[id number]  Eileen Stanford, LCSW

## 2021-07-05 NOTE — Progress Notes (Signed)
Mobility Specialist - Progress Note   07/05/21 1600  Mobility  Activity Ambulated with assistance in hallway;Transferred from bed to chair;Transferred from chair to bed  Level of Assistance Standby assist, set-up cues, supervision of patient - no hands on  Assistive Device None  Distance Ambulated (ft) 200 ft  Activity Response Tolerated well  $Mobility charge 1 Mobility    During mobility: 115 HR, 91% SpO2   Pt ambulated in hallway with supervision and RA. Balance improved this date, less staggering L with no LOB. Pt voiced pain in R calf area during ambulation. 1 rest break taken. O2 maintained > 90% in session. Upon return to room, noted soiled sheets d/t BM. Pt transferred to chair as new bed linen provided. Assist with peri-care and gown change. Pt returned to bed with alarm set, needs in reach. RN notified.   Filiberto Pinks Mobility Specialist 07/05/21, 4:05 PM

## 2021-07-05 NOTE — Care Management Important Message (Signed)
Important Message  Patient Details  Name: Paul Vaughan MRN: [id number] Date of Birth: 1955-11-25   Medicare Important Message Given:  Yes     Olegario Messier A [name] 07/05/2021, 3:11 PM

## 2021-07-05 NOTE — TOC Progression Note (Addendum)
Transition of Care [facility]) - Progression Note    Patient Details  Name: Paul Vaughan MRN: [id number] Date of Birth: 1955/08/20  Transition of Care [facility]) CM/SW Contact  Maree Krabbe, LCSW Phone Number: 07/05/2021, 3:37 PM  Clinical Narrative:   Clincials sent to [facility] at group home. Eldridge Dace is requesting that CSW contact here asap tomorrow so she can arranged for transport.  [facility] will service for [facility].  Expected Discharge Plan: Group Home Barriers to Discharge: Continued Medical Work up  Expected Discharge Plan and Services Expected Discharge Plan: Group Home       Living arrangements for the past 2 months: Group Home                                       Social Determinants of Health (SDOH) Interventions    Readmission Risk Interventions Readmission Risk Prevention Plan 07/01/2021  Transportation Screening Complete  PCP or Specialist Appt within 5-7 Days Complete  Home Care Screening Complete  Medication Review (RN CM) Complete  Some recent data might be hidden

## 2021-07-06 LAB — CBC
HCT: 32.9 % — ABNORMAL LOW (ref 39.0–52.0)
Hemoglobin: 11.2 g/dL — ABNORMAL LOW (ref 13.0–17.0)
MCH: 31.7 pg (ref 26.0–34.0)
MCHC: 34 g/dL (ref 30.0–36.0)
MCV: 93.2 fL (ref 80.0–100.0)
Platelets: 214 10*3/uL (ref 150–400)
RBC: 3.53 MIL/uL — ABNORMAL LOW (ref 4.22–5.81)
RDW: 14 % (ref 11.5–15.5)
WBC: 17.1 10*3/uL — ABNORMAL HIGH (ref 4.0–10.5)
nRBC: 0.5 % — ABNORMAL HIGH (ref 0.0–0.2)

## 2021-07-06 LAB — BASIC METABOLIC PANEL
Anion gap: 9 (ref 5–15)
BUN: 52 mg/dL — ABNORMAL HIGH (ref 8–23)
CO2: 24 mmol/L (ref 22–32)
Calcium: 8.7 mg/dL — ABNORMAL LOW (ref 8.9–10.3)
Chloride: 102 mmol/L (ref 98–111)
Creatinine, Ser: 1.35 mg/dL — ABNORMAL HIGH (ref 0.61–1.24)
GFR, Estimated: 58 mL/min — ABNORMAL LOW (ref >60)
Glucose, Bld: 127 mg/dL — ABNORMAL HIGH (ref 70–99)
Potassium: 4.2 mmol/L (ref 3.5–5.1)
Sodium: 135 mmol/L (ref 135–145)

## 2021-07-06 MED ORDER — PREDNISONE 20 MG PO TABS: 40.0000 mg | ORAL_TABLET | Freq: Every day | ORAL | 0 refills | Status: AC

## 2021-07-06 MED ORDER — ROSUVASTATIN CALCIUM 20 MG PO TABS: 20.0000 mg | ORAL_TABLET | Freq: Every day | ORAL | Status: DC

## 2021-07-06 NOTE — Discharge Summary (Signed)
Physician Discharge Summary  Paul Vaughan EAV:[id number] DOB: [birth year] DOA: 06/30/2021  PCP: Gae Bon, NP  Admit date: 06/30/2021 Discharge date: 07/06/2021  Admitted From: group home  Disposition: group home  Recommendations for Outpatient Follow-up:  Follow up with PCP in 1-2 weeks   Home Health: yes Equipment/Devices:  Discharge Condition: stable  CODE STATUS: full  Diet recommendation: Regular  Brief/Interim Summary: HPI was taken from Dr. Francine Graven: Paul Vaughan is a 66 y.o. male with medical history significant for stage IIIa chronic kidney disease, hypertension, dementia who was sent from the group home where he resides to the ER for evaluation of increased weakness and unsteady gait.  He was found to have a fever by EMS with a temp of 100 and was tachycardic with heart rate of 112. Upon arrival to the ER he was noted to be tachycardic and had a Tmax of 102.9. Per group home staff they note that he is more confused compared to his baseline and appeared to be less responsive than his normal. I am unable to do a review of systems on this patient due to his underlying dementia.  As per Dr. Leslye Peer: 22 year old man came to the hospital with weakness and fever and found to have COVID-19 sepsis and pneumonia.  Patient currently lives in a group home with a roommate.  He has adjustment disorder and intellectual disability.   Initially the patient was started on Paxlovid but then became hypoxic and had a high CRP.  Steroids were added and Paxlovid was switched over to remdesivir.  Patient also on doxycycline   On 07/04/2021 the patient was able to come off oxygen.   As per Dr. Jimmye Norman 2/22-2/23/23: Pt has remained off of supplemental oxygen. Pt completed the remdesivir and abx course while inpatient   Discharge Diagnoses:  Principal Problem:   Acute hypoxemic respiratory failure due to COVID-19 [facility]) Active Problems:   Borderline intellectual disability    Adjustment disorder with mixed disturbance of emotions and conduct   Sepsis ([place])   CAP (community acquired pneumonia)   Thrombocytopenia ([place])   Anemia of chronic disease   Acute kidney injury superimposed on CKD ([place])  Acute hypoxemic respiratory failure: secondary to COVID-19. SaO2 of 82% on room air on 07/03/2021. Weaned off of supplemental oxygen. Completed remdesivir course and continue on steroids x 4 days more. Encourage incentive spirometry   Sepsis: present on admission. See Dr. Marshia Ly note on how pt met sepsis criteria.   CAP: possibly superimposed bacterial infection. Completed abx course  AKI on CKDIIIa: Cr is labile. Avoid nephrotoxic meds    Anemia of chronic disease: H&H are stable. No need for a transfusion currently     Thrombocytopenia: resolved .   Adjustment disorder:  with mixed disturbance of emotions and conduct. Continue on home dose of lexapro, depakote, gabapentin & seroquel    Borderline intellectual disability: continue w/ supportive care   Discharge Instructions  Discharge Instructions     Diet general   Complete by: As directed    Discharge instructions   Complete by: As directed    F/u w/ PCP in 1-2 weeks.   Increase activity slowly   Complete by: As directed       Allergies as of 07/06/2021       Reactions   Penicillins Rash, Swelling   .Has patient had a PCN reaction causing immediate rash, facial/tongue/throat swelling, SOB or lightheadedness with hypotension: Unknown Has patient had a PCN reaction causing severe rash involving mucus membranes or  skin necrosis: Unknown Has patient had a PCN reaction that required hospitalization: Unknown Has patient had a PCN reaction occurring within the last 10 years: Unknown If all of the above answers are "NO", then may proceed with Cephalosporin use.   Sulfa Antibiotics Rash, Swelling        Medication List     TAKE these medications    albuterol 108 (90 Base) MCG/ACT inhaler Commonly  known as: VENTOLIN HFA Inhale 2 puffs into the lungs every 6 (six) hours as needed for wheezing or shortness of breath.   aspirin EC 81 MG tablet Take 81 mg by mouth daily. Swallow whole.   divalproex 500 MG 24 hr tablet Commonly known as: DEPAKOTE ER Take 500-1,000 mg by mouth 2 (two) times daily. 500 mg every morning and 1000 mg at bedtime   escitalopram 5 MG tablet Commonly known as: LEXAPRO Take 5 mg by mouth at bedtime.   folic acid 1 MG tablet Commonly known as: FOLVITE Take 1 mg by mouth daily at 12 noon.   gabapentin 100 MG capsule Commonly known as: NEURONTIN Take 100 mg by mouth 3 (three) times daily. 100 mg twice daily 300 mg nightly   lisinopril 5 MG tablet Commonly known as: ZESTRIL Take 5 mg by mouth daily.   memantine 5 MG tablet Commonly known as: NAMENDA Take 5 mg by mouth at bedtime.   predniSONE 20 MG tablet Commonly known as: DELTASONE Take 2 tablets (40 mg total) by mouth daily for 4 days.   pyridOXINE 100 MG tablet Commonly known as: VITAMIN B-6 Take 100 mg by mouth at bedtime.   QUEtiapine 100 MG tablet Commonly known as: SEROQUEL Take 150 mg by mouth at bedtime. What changed: Another medication with the same name was removed. Continue taking this medication, and follow the directions you see here.   QUEtiapine 50 MG tablet Commonly known as: SEROQUEL Take 50-100 mg by mouth 2 (two) times daily. 50 mg every morning and 100 mg at 2 pm What changed: Another medication with the same name was removed. Continue taking this medication, and follow the directions you see here.   rosuvastatin 20 MG tablet Commonly known as: CRESTOR Take 20 mg by mouth at bedtime.   thiamine 100 MG tablet Take 100 mg by mouth daily at 12 noon.        Allergies  Allergen Reactions   Penicillins Rash and Swelling    .Has patient had a PCN reaction causing immediate rash, facial/tongue/throat swelling, SOB or lightheadedness with hypotension: Unknown Has  patient had a PCN reaction causing severe rash involving mucus membranes or skin necrosis: Unknown Has patient had a PCN reaction that required hospitalization: Unknown Has patient had a PCN reaction occurring within the last 10 years: Unknown If all of the above answers are "NO", then may proceed with Cephalosporin use.    Sulfa Antibiotics Rash and Swelling    Consultations:    Procedures/Studies: US RENAL  Result Date: 06/20/2021 CLINICAL DATA:  Other abnormal findings in urine. Chronic kidney disease stage 3a. Hyperlipidemia. Simple cyst of kidney. Proteinuria. EXAM: RENAL / URINARY TRACT ULTRASOUND COMPLETE COMPARISON:  None. FINDINGS: Right Kidney: Renal measurements: 9.0 x 4.4 x 4.7 cm = volume: 98 mL. Thinning of renal parenchyma with increased parenchymal echogenicity. No hydronephrosis. 1 cm cyst in the upper kidney may have low level internal echoes, too small to characterize. No visualized renal calculi. Left Kidney: Renal measurements: 9.9 x 5.7 x 4.7 cm = volume: 140 mL. Increased  renal parenchymal echogenicity with mild thinning of renal parenchyma. No hydronephrosis. There is a 3.4 cm cyst arising from the upper kidney. No internal complexity. No visualized renal calculi. Bladder: Appears normal for degree of bladder distention. Only partially distended. Other: None. IMPRESSION: 1. Thinning of renal parenchyma and increased renal parenchymal echogenicity typical of chronic medical renal disease. 2. Bilateral renal cysts, larger on the left. Electronically Signed   By: Keith Rake M.D.   On: 06/20/2021 10:58   DG Chest Port 1 View  Result Date: 06/30/2021 CLINICAL DATA:  Provided history: Questionable sepsis-evaluate for abnormality. Additional history provided: Increasing weakness and gait difficulty. History of dementia. EXAM: PORTABLE CHEST 1 VIEW COMPARISON:  Prior chest radiographs 12/13/2019 and earlier. FINDINGS: Shallow inspiration radiograph. Heart size within normal  limits. Aortic atherosclerosis. Mild elevation of the right hemidiaphragm. Ill-defined opacity within the medial right lung base. No appreciable airspace consolidation within the left lung. No evidence of pleural effusion or pneumothorax. No acute bony abnormality identified. IMPRESSION: Shallow inspiration radiograph. Ill-defined opacity within the medial right lung base, which may reflect atelectasis or pneumonia. Mild elevation of the right hemidiaphragm, new from the prior chest radiograph of 12/13/2019. Aortic Atherosclerosis (ICD10-I70.0). Electronically Signed   By: Kellie Simmering D.O.   On: 06/30/2021 09:10   (Echo, Carotid, EGD, Colonoscopy, ERCP)    Subjective: Pt denies any complaints    Discharge Exam: Vitals:   07/06/21 0531 07/06/21 0802  BP: (!) 149/72 124/73  Pulse: 84 82  Resp: 19 18  Temp: 98 F (36.7 C) 97.8 F (36.6 C)  SpO2: 95% 92%   Vitals:   07/05/21 1612 07/05/21 1950 07/06/21 0531 07/06/21 0802  BP: 124/71 124/66 (!) 149/72 124/73  Pulse: 82 87 84 82  Resp: _0 Temp: 98.2 F (36.8 C) 98 F (36.7 C) 98 F (36.7 C) 97.8 F (36.6 C)  TempSrc: Oral     SpO2: 93% 95% 95% 92%  Weight:      Height:        General: Pt is alert, awake, not in acute distress Cardiovascular:  S1/S2 +, no rubs, no gallops Respiratory: CTA bilaterally, no wheezing, no rhonchi Abdominal: Soft, NT, ND, bowel sounds + Extremities: no edema, no cyanosis    The results of significant diagnostics from this hospitalization (including imaging, microbiology, ancillary and laboratory) are listed below for reference.     Microbiology: Recent Results (from the past 240 hour(s))  Resp Panel by RT-PCR (Flu A&B, Covid) Nasopharyngeal Swab     Status: Abnormal   Collection Time: 06/30/21  9:00 AM   Specimen: Nasopharyngeal Swab; Nasopharyngeal(NP) swabs in vial transport medium  Result Value Ref Range Status   SARS Coronavirus 2 by RT PCR POSITIVE (A) NEGATIVE Final    Comment:  (NOTE) SARS-CoV-2 target nucleic acids are DETECTED.  The SARS-CoV-2 RNA is generally detectable in upper respiratory specimens during the acute phase of infection. Positive results are indicative of the presence of the identified virus, but do not rule out bacterial infection or co-infection with other pathogens not detected by the test. Clinical correlation with patient history and other diagnostic information is necessary to determine patient infection status. The expected result is Negative.  Fact Sheet for Patients: EntrepreneurPulse.com.au  Fact Sheet for Healthcare Providers: IncredibleEmployment.be  This test is not yet approved or cleared by the [place] FDA and  has been authorized for detection and/or diagnosis of SARS-CoV-2 by FDA under an Emergency Use Authorization (EUA).  This  EUA will remain in effect (meaning this test can be used) for the duration of  the COVID-19 declaration under Section 564(b)(1) of the A ct, 21 U.S.C. section 360bbb-3(b)(1), unless the authorization is terminated or revoked sooner.     Influenza A by PCR NEGATIVE NEGATIVE Final   Influenza B by PCR NEGATIVE NEGATIVE Final    Comment: (NOTE) The Xpert Xpress SARS-CoV-2/FLU/RSV plus assay is intended as an aid in the diagnosis of influenza from Nasopharyngeal swab specimens and should not be used as a sole basis for treatment. Nasal washings and aspirates are unacceptable for Xpert Xpress SARS-CoV-2/FLU/RSV testing.  Fact Sheet for Patients: EntrepreneurPulse.com.au  Fact Sheet for Healthcare Providers: IncredibleEmployment.be  This test is not yet approved or cleared by the [place] FDA and has been authorized for detection and/or diagnosis of SARS-CoV-2 by FDA under an Emergency Use Authorization (EUA). This EUA will remain in effect (meaning this test can be used) for the duration of the COVID-19  declaration under Section 564(b)(1) of the Act, 21 U.S.C. section 360bbb-3(b)(1), unless the authorization is terminated or revoked.  Performed at [facility], [address] [place], [place] [address]   Blood Culture (routine x 2)     Status: None   Collection Time: 06/30/21  9:00 AM   Specimen: BLOOD RIGHT HAND  Result Value Ref Range Status   Specimen Description BLOOD RIGHT HAND  Final   Special Requests   Final    BOTTLES DRAWN AEROBIC AND ANAEROBIC Blood Culture adequate volume   Culture   Final    NO GROWTH 5 DAYS Performed at [facility], [address], [place], [place] [address]    Report Status 07/05/2021 FINAL  Final  Blood Culture (routine x 2)     Status: None   Collection Time: 06/30/21  9:00 AM   Specimen: BLOOD  Result Value Ref Range Status   Specimen Description BLOOD BLOOD LEFT HAND  Final   Special Requests   Final    BOTTLES DRAWN AEROBIC AND ANAEROBIC Blood Culture adequate volume   Culture   Final    NO GROWTH 5 DAYS Performed at [facility], [address], [place], [place] [address]    Report Status 07/05/2021 FINAL  Final  Urine Culture     Status: Abnormal   Collection Time: 06/30/21 10:46 AM   Specimen: In/Out Cath Urine  Result Value Ref Range Status   Specimen Description   Final    IN/OUT CATH URINE Performed at [facility], [address] [place], [place] [address]    Special Requests   Final    NONE Performed at [facility], [address] [place], [place] [address]    Culture MULTIPLE SPECIES PRESENT, SUGGEST RECOLLECTION (A)  Final   Report Status 07/02/2021 FINAL  Final     Labs: BNP (last 3 results) No results for input(s): BNP in the last 8760 hours. Basic Metabolic Panel: Recent Labs  Lab 07/01/21 0601 07/02/21 0541 07/04/21 0546 07/05/21 0613 07/06/21 0557  NA 134* 137 137 138 135  K 3.8 3.7 4.2 4.5 4.2  CL 103 103 102 102 102  CO2 _0 GLUCOSE 95  89 139* 138* 127*  BUN 29* 24* 41* 47* 52*  CREATININE 1.65* 1.49* 1.34* 1.23 1.35*  CALCIUM 8.5* 8.5* 9.1 9.0 8.7*   Liver Function Tests: Recent Labs  Lab 06/30/21 0900 07/04/21 0546  AST 74* 51*  ALT 35 37  ALKPHOS 42 49  BILITOT 0.8 0.5  PROT 7.8 7.2  ALBUMIN 3.8 3.1*   No results for input(s): LIPASE, AMYLASE in the last 168 hours. No results for input(s): AMMONIA in the last 168 hours. CBC: Recent Labs  Lab 06/30/21 0900 07/01/21 0601 07/02/21 0541 07/04/21 0546 07/05/21 0613 07/06/21 0557  WBC 10.0 6.7 6.0 12.3* 14.2* 17.1*  NEUTROABS 6.8  --   --   --   --   --   HGB 12.1* 9.4* 9.6* 11.3* 11.8* 11.2*  HCT 36.6* 27.5* 27.7* 32.6* 34.4* 32.9*  MCV 97.3 94.5 93.0 93.9 94.8 93.2  PLT 162 120* 116* 141* 181 214   Cardiac Enzymes: No results for input(s): CKTOTAL, CKMB, CKMBINDEX, TROPONINI in the last 168 hours. BNP: Invalid input(s): POCBNP CBG: No results for input(s): GLUCAP in the last 168 hours. D-Dimer Recent Labs    07/04/21 0546  DDIMER 0.92*   Hgb A1c No results for input(s): HGBA1C in the last 72 hours. Lipid Profile No results for input(s): CHOL, HDL, LDLCALC, TRIG, CHOLHDL, LDLDIRECT in the last 72 hours. Thyroid function studies No results for input(s): TSH, T4TOTAL, T3FREE, THYROIDAB in the last 72 hours.  Invalid input(s): FREET3 Anemia work up Recent Labs    07/04/21 0546  FERRITIN 720*   Urinalysis    Component Value Date/Time   COLORURINE YELLOW (A) 06/30/2021 1046   APPEARANCEUR HAZY (A) 06/30/2021 1046   LABSPEC 1.028 06/30/2021 1046   PHURINE 6.0 06/30/2021 1046   GLUCOSEU NEGATIVE 06/30/2021 1046   HGBUR MODERATE (A) 06/30/2021 1046   BILIRUBINUR NEGATIVE 06/30/2021 1046   KETONESUR 5 (A) 06/30/2021 1046   PROTEINUR 30 (A) 06/30/2021 1046   NITRITE NEGATIVE 06/30/2021 1046   LEUKOCYTESUR NEGATIVE 06/30/2021 1046   Sepsis Labs Invalid input(s): PROCALCITONIN,  WBC,  LACTICIDVEN Microbiology Recent Results (from the  past 240 hour(s))  Resp Panel by RT-PCR (Flu A&B, Covid) Nasopharyngeal Swab     Status: Abnormal   Collection Time: 06/30/21  9:00 AM   Specimen: Nasopharyngeal Swab; Nasopharyngeal(NP) swabs in vial transport medium  Result Value Ref Range Status   SARS Coronavirus 2 by RT PCR POSITIVE (A) NEGATIVE Final    Comment: (NOTE) SARS-CoV-2 target nucleic acids are DETECTED.  The SARS-CoV-2 RNA is generally detectable in upper respiratory specimens during the acute phase of infection. Positive results are indicative of the presence of the identified virus, but do not rule out bacterial infection or co-infection with other pathogens not detected by the test. Clinical correlation with patient history and other diagnostic information is necessary to determine patient infection status. The expected result is Negative.  Fact Sheet for Patients: EntrepreneurPulse.com.au  Fact Sheet for Healthcare Providers: IncredibleEmployment.be  This test is not yet approved or cleared by the [place] FDA and  has been authorized for detection and/or diagnosis of SARS-CoV-2 by FDA under an Emergency Use Authorization (EUA).  This EUA will remain in effect (meaning this test can be used) for the duration of  the COVID-19 declaration under Section 564(b)(1) of the A ct, 21 U.S.C. section 360bbb-3(b)(1), unless the authorization is terminated or revoked sooner.     Influenza A by PCR NEGATIVE NEGATIVE Final   Influenza B by PCR NEGATIVE NEGATIVE Final    Comment: (NOTE) The Xpert Xpress SARS-CoV-2/FLU/RSV plus assay is intended as an aid in the diagnosis of influenza from Nasopharyngeal swab specimens and should not be used as a sole basis for treatment. Nasal washings and aspirates are unacceptable for Xpert  Xpress SARS-CoV-2/FLU/RSV testing.  Fact Sheet for Patients: EntrepreneurPulse.com.au  Fact Sheet for Healthcare  Providers: IncredibleEmployment.be  This test is not yet approved or cleared by the [place] FDA and has been authorized for detection and/or diagnosis of SARS-CoV-2 by FDA under an Emergency Use Authorization (EUA). This EUA will remain in effect (meaning this test can be used) for the duration of the COVID-19 declaration under Section 564(b)(1) of the Act, 21 U.S.C. section 360bbb-3(b)(1), unless the authorization is terminated or revoked.  Performed at [facility], [address] [place], [place] [address]   Blood Culture (routine x 2)     Status: None   Collection Time: 06/30/21  9:00 AM   Specimen: BLOOD RIGHT HAND  Result Value Ref Range Status   Specimen Description BLOOD RIGHT HAND  Final   Special Requests   Final    BOTTLES DRAWN AEROBIC AND ANAEROBIC Blood Culture adequate volume   Culture   Final    NO GROWTH 5 DAYS Performed at [facility], [address], [place], [place] [address]    Report Status 07/05/2021 FINAL  Final  Blood Culture (routine x 2)     Status: None   Collection Time: 06/30/21  9:00 AM   Specimen: BLOOD  Result Value Ref Range Status   Specimen Description BLOOD BLOOD LEFT HAND  Final   Special Requests   Final    BOTTLES DRAWN AEROBIC AND ANAEROBIC Blood Culture adequate volume   Culture   Final    NO GROWTH 5 DAYS Performed at [facility], [address], [place], [place] [address]    Report Status 07/05/2021 FINAL  Final  Urine Culture     Status: Abnormal   Collection Time: 06/30/21 10:46 AM   Specimen: In/Out Cath Urine  Result Value Ref Range Status   Specimen Description   Final    IN/OUT CATH URINE Performed at [facility], [address], [place], [place] [address]    Special Requests   Final    NONE Performed at [facility], [address] [place],  [address]    Culture MULTIPLE SPECIES PRESENT, SUGGEST RECOLLECTION (A)  Final   Report  Status 07/02/2021 FINAL  Final     Time coordinating discharge: Over 30 minutes  SIGNED:   Wyvonnia Dusky, MD  [facility] 07/06/2021, 12:41 PM Pager   If 7PM-7AM, please contact night-coverage

## 2021-07-06 NOTE — TOC Progression Note (Signed)
Transition of Care [facility]) - Progression Note    Patient Details  Name: Paul Vaughan MRN: [id number] Date of Birth: Dec 02, 1955  Transition of Care [facility]) CM/SW Contact  Caryn Section, RN Phone Number: 07/06/2021, 1:54 PM  Clinical Narrative:   Patient discharging to Group home today.  E[facility] Dace will pick up.  [facility].    Expected Discharge Plan: Group Home Barriers to Discharge: Continued Medical Work up  Expected Discharge Plan and Services Expected Discharge Plan: Group Home       Living arrangements for the past 2 months: Group Home Expected Discharge Date: 07/06/21                                     Social Determinants of Health (SDOH) Interventions    Readmission Risk Interventions Readmission Risk Prevention Plan 07/01/2021  Transportation Screening Complete  PCP or Specialist Appt within 5-7 Days Complete  Home Care Screening Complete  Medication Review (RN CM) Complete  Some recent data might be hidden

## 2022-05-30 IMAGING — CT CT HEAD W/O CM
3 series · 15 of 47 positions shown, 18 images · non-contrast
Comparison: None.

CLINICAL DATA: Headache, head trauma.  Syncopal episode.

EXAM:
CT HEAD WITHOUT CONTRAST
TECHNIQUE: Contiguous axial images were obtained from the base of the skull
through the vertex without intravenous contrast.

[Series 2: head wo · axial · 0.42mm/px · z∈[-148,-23]mm · 9 of 31 slices shown, 12 images]
[im 3/31  brain]
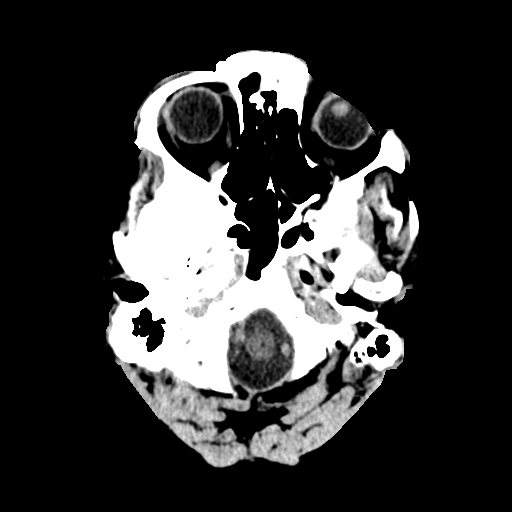
[im 3/31  bone]
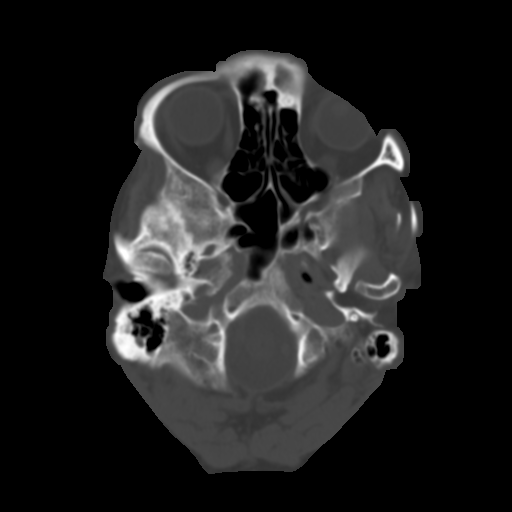
[im 6/31  brain]
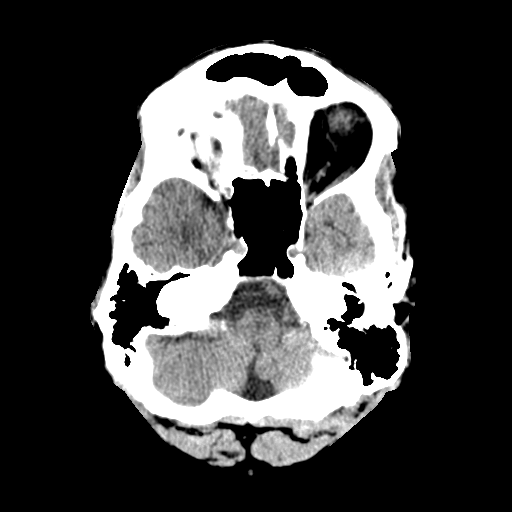
[im 9/31  brain]
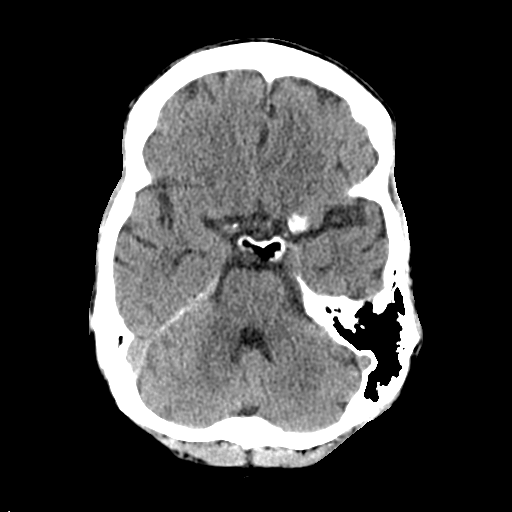
[im 12/31  brain]
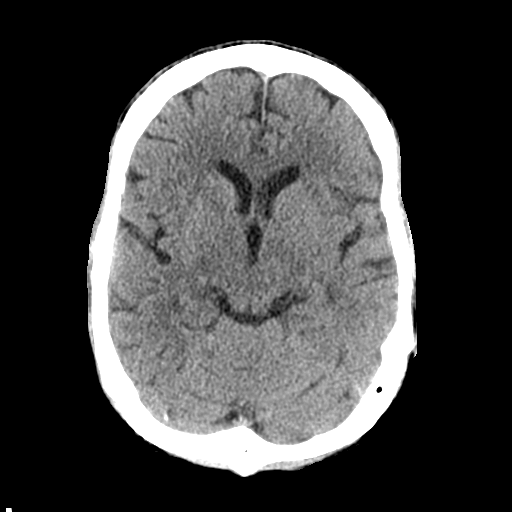
[im 16/31  brain]
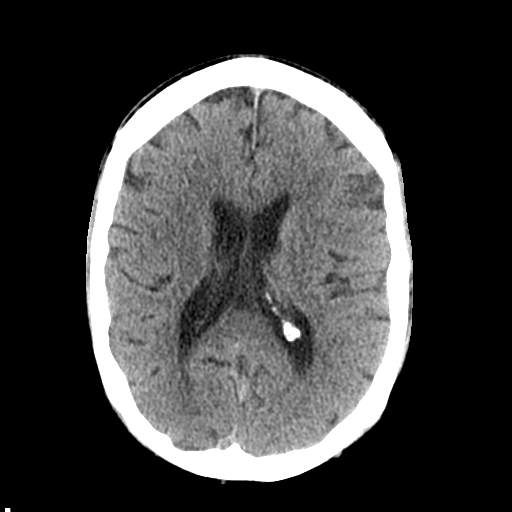
[im 16/31  bone]
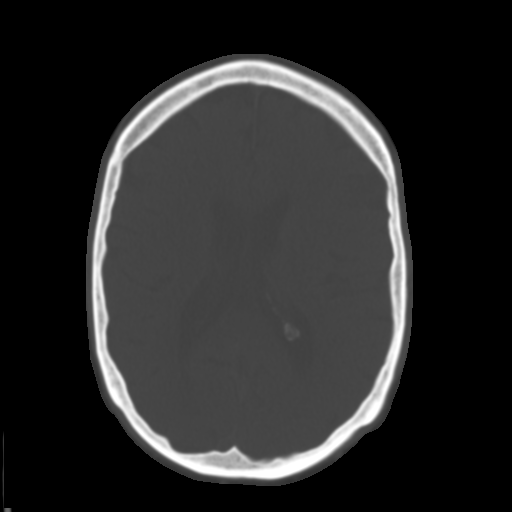
[im 19/31  brain]
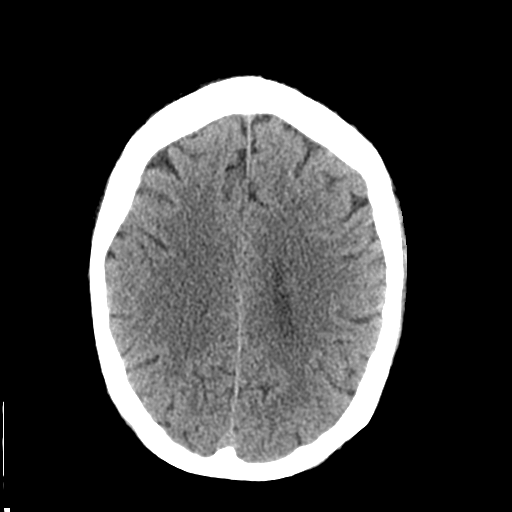
[im 22/31  brain]
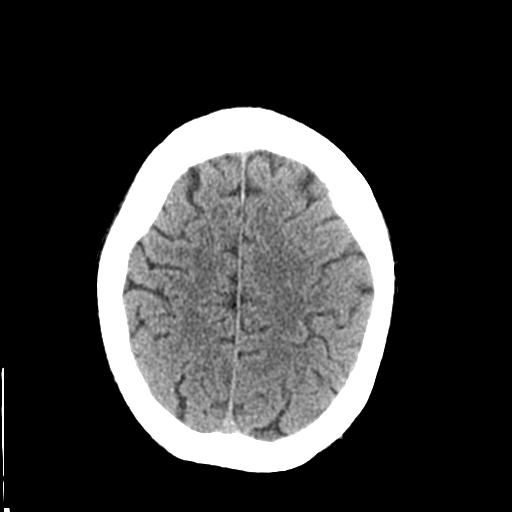
[im 25/31  brain]
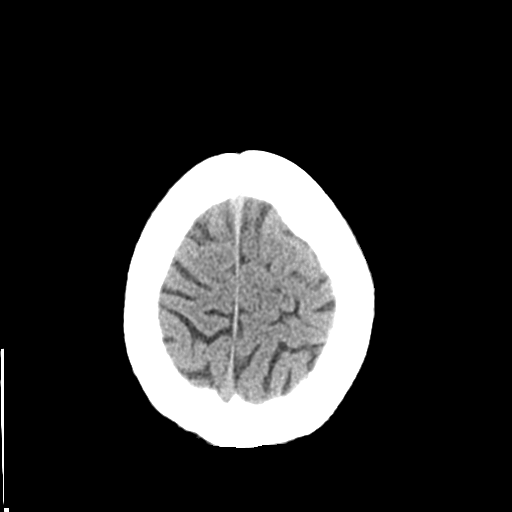
[im 28/31  brain]
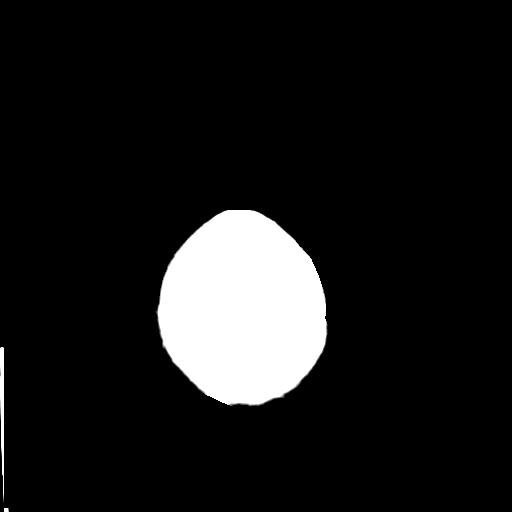
[im 28/31  bone]
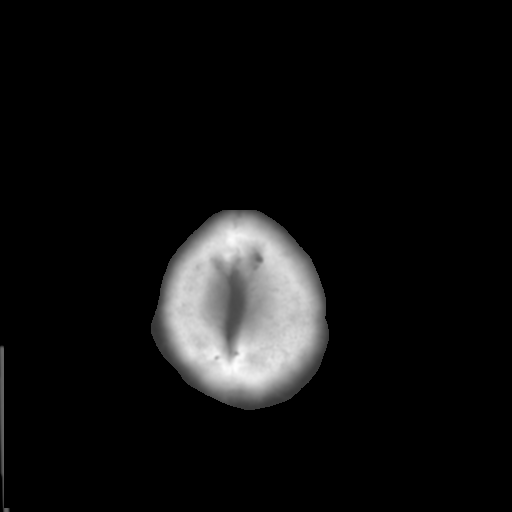

[Series 4: coronal soft tissue · coronal · 0.31mm/px · 3 of 61 slices shown]
[im 21/61  brain]
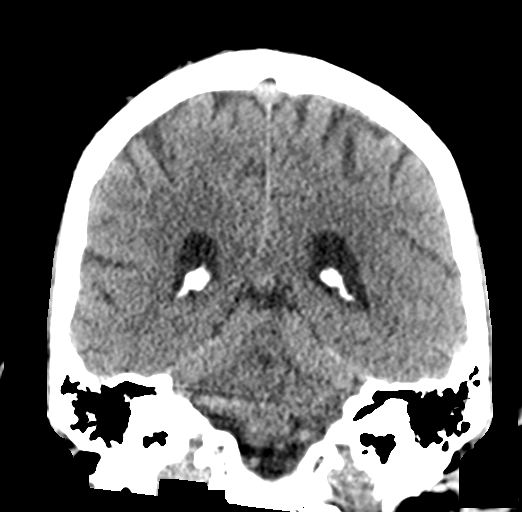
[im 27/61  brain]
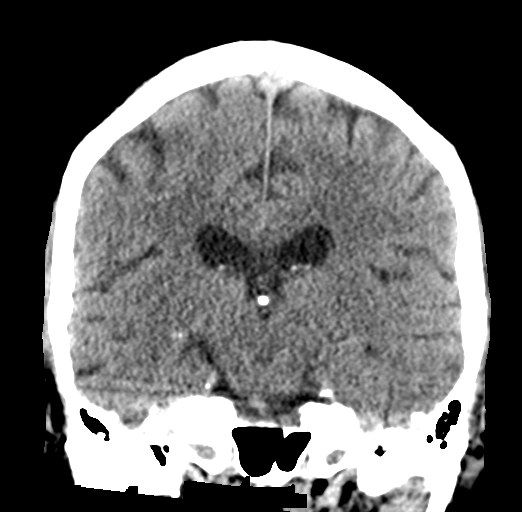
[im 34/61  brain]
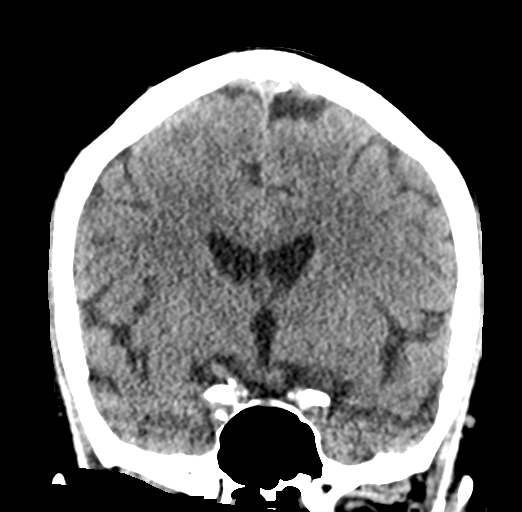

[Series 5: sagittal soft tissue · sagittal · 0.29mm/px · 3 of 47 slices shown]
[im 16/47  brain]
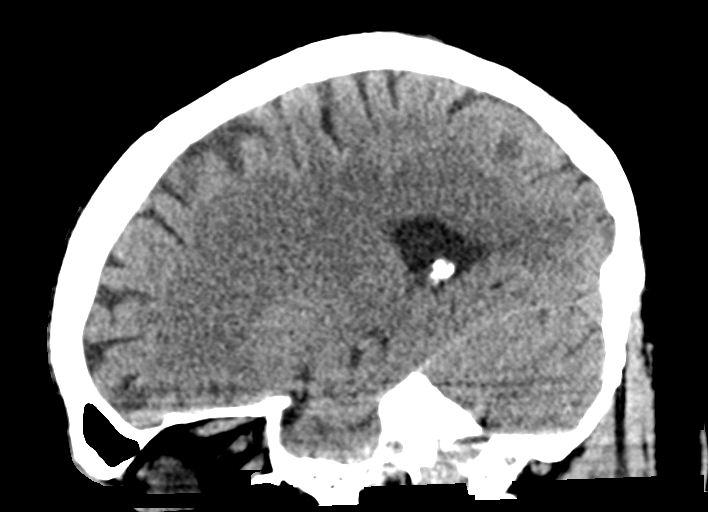
[im 24/47  brain]
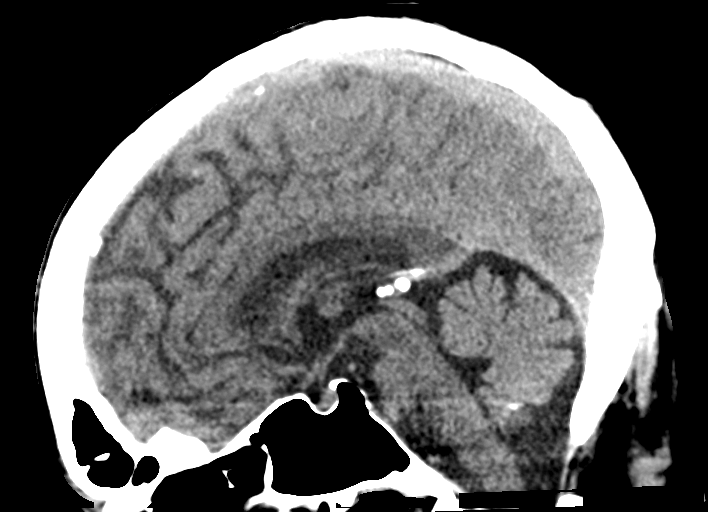
[im 31/47  brain]
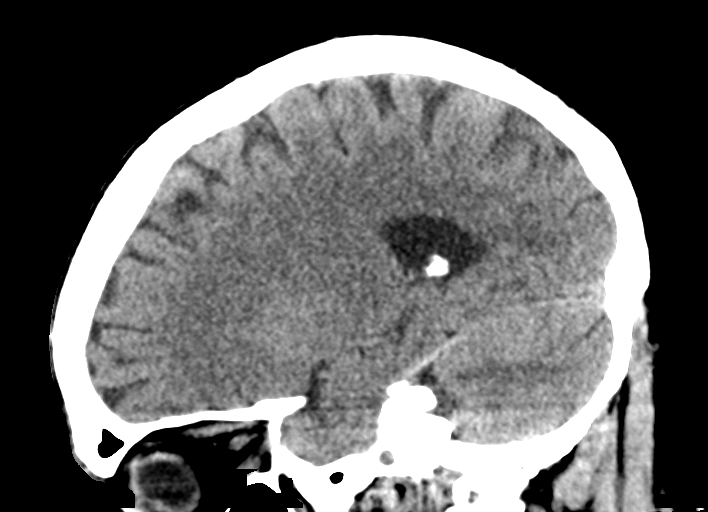

[15 of 47 positions shown; findings below may reference images not displayed]

FINDINGS: Brain: No intracranial hemorrhage, mass effect, or midline shift.
Normal brain volume for age. No hydrocephalus. The basilar cisterns
are patent. No evidence of territorial infarct or acute ischemia. No
extra-axial or intracranial fluid collection.

Vascular: Atherosclerosis of skullbase vasculature without
hyperdense vessel or abnormal calcification.

Skull: No fracture or focal lesion.

Sinuses/Orbits: Paranasal sinuses and mastoid air cells are clear.
The visualized orbits are unremarkable. There is debris in the left
external auditory canal.

Other: None.
IMPRESSION: Unremarkable head CT for age. No acute intracranial abnormality. No
skull fracture.

## 2022-08-17 ENCOUNTER — Other Ambulatory Visit: Payer: Self-pay | Admitting: Nephrology

## 2022-08-17 DIAGNOSIS — E785 Hyperlipidemia, unspecified: Secondary | ICD-10-CM

## 2022-08-17 DIAGNOSIS — N281 Cyst of kidney, acquired: Secondary | ICD-10-CM

## 2022-08-17 DIAGNOSIS — N1831 Chronic kidney disease, stage 3a: Secondary | ICD-10-CM

## 2022-08-27 ENCOUNTER — Ambulatory Visit
Admission: RE | Admit: 2022-08-27 | Discharge: 2022-08-27 | Disposition: A | Discharge status: Home / Self Care | Payer: Medicare Other | Source: Ambulatory Visit | Attending: Nephrology | Admitting: Nephrology

## 2022-08-27 DIAGNOSIS — E785 Hyperlipidemia, unspecified: Secondary | ICD-10-CM | POA: Insufficient documentation

## 2022-08-27 DIAGNOSIS — N281 Cyst of kidney, acquired: Secondary | ICD-10-CM

## 2022-08-27 DIAGNOSIS — N1831 Chronic kidney disease, stage 3a: Secondary | ICD-10-CM

## 2023-07-28 ENCOUNTER — Emergency Department

## 2023-07-28 ENCOUNTER — Inpatient Hospital Stay
Admission: EM | Admit: 2023-07-28 | Discharge: 2023-08-19 | DRG: 871 | Disposition: A | Discharge status: Skilled Nursing Facility | Attending: Osteopathic Medicine | Admitting: Osteopathic Medicine

## 2023-07-28 ENCOUNTER — Other Ambulatory Visit: Payer: Self-pay

## 2023-07-28 DIAGNOSIS — E872 Acidosis, unspecified: Secondary | ICD-10-CM | POA: Diagnosis present

## 2023-07-28 DIAGNOSIS — R6520 Severe sepsis without septic shock: Secondary | ICD-10-CM | POA: Diagnosis present

## 2023-07-28 DIAGNOSIS — N179 Acute kidney failure, unspecified: Secondary | ICD-10-CM | POA: Diagnosis present

## 2023-07-28 DIAGNOSIS — J9811 Atelectasis: Secondary | ICD-10-CM | POA: Diagnosis not present

## 2023-07-28 DIAGNOSIS — J189 Pneumonia, unspecified organism: Secondary | ICD-10-CM | POA: Diagnosis present

## 2023-07-28 DIAGNOSIS — J9601 Acute respiratory failure with hypoxia: Secondary | ICD-10-CM

## 2023-07-28 DIAGNOSIS — J449 Chronic obstructive pulmonary disease, unspecified: Secondary | ICD-10-CM | POA: Diagnosis present

## 2023-07-28 DIAGNOSIS — Z7982 Long term (current) use of aspirin: Secondary | ICD-10-CM

## 2023-07-28 DIAGNOSIS — J439 Emphysema, unspecified: Secondary | ICD-10-CM | POA: Diagnosis present

## 2023-07-28 DIAGNOSIS — M6282 Rhabdomyolysis: Secondary | ICD-10-CM | POA: Diagnosis present

## 2023-07-28 DIAGNOSIS — F17210 Nicotine dependence, cigarettes, uncomplicated: Secondary | ICD-10-CM | POA: Diagnosis present

## 2023-07-28 DIAGNOSIS — E860 Dehydration: Secondary | ICD-10-CM | POA: Diagnosis present

## 2023-07-28 DIAGNOSIS — Z882 Allergy status to sulfonamides status: Secondary | ICD-10-CM

## 2023-07-28 DIAGNOSIS — E871 Hypo-osmolality and hyponatremia: Secondary | ICD-10-CM | POA: Diagnosis not present

## 2023-07-28 DIAGNOSIS — Z6841 Body Mass Index (BMI) 40.0 and over, adult: Secondary | ICD-10-CM

## 2023-07-28 DIAGNOSIS — I5032 Chronic diastolic (congestive) heart failure: Secondary | ICD-10-CM | POA: Diagnosis present

## 2023-07-28 DIAGNOSIS — F03918 Unspecified dementia, unspecified severity, with other behavioral disturbance: Secondary | ICD-10-CM | POA: Diagnosis present

## 2023-07-28 DIAGNOSIS — I130 Hypertensive heart and chronic kidney disease with heart failure and stage 1 through stage 4 chronic kidney disease, or unspecified chronic kidney disease: Secondary | ICD-10-CM | POA: Diagnosis present

## 2023-07-28 DIAGNOSIS — E43 Unspecified severe protein-calorie malnutrition: Secondary | ICD-10-CM | POA: Diagnosis present

## 2023-07-28 DIAGNOSIS — E8809 Other disorders of plasma-protein metabolism, not elsewhere classified: Secondary | ICD-10-CM | POA: Diagnosis not present

## 2023-07-28 DIAGNOSIS — R7989 Other specified abnormal findings of blood chemistry: Secondary | ICD-10-CM

## 2023-07-28 DIAGNOSIS — A419 Sepsis, unspecified organism: Secondary | ICD-10-CM | POA: Diagnosis not present

## 2023-07-28 DIAGNOSIS — Z7984 Long term (current) use of oral hypoglycemic drugs: Secondary | ICD-10-CM

## 2023-07-28 DIAGNOSIS — J440 Chronic obstructive pulmonary disease with acute lower respiratory infection: Secondary | ICD-10-CM | POA: Diagnosis present

## 2023-07-28 DIAGNOSIS — I10 Essential (primary) hypertension: Secondary | ICD-10-CM | POA: Diagnosis present

## 2023-07-28 DIAGNOSIS — R4183 Borderline intellectual functioning: Secondary | ICD-10-CM | POA: Diagnosis present

## 2023-07-28 DIAGNOSIS — E861 Hypovolemia: Secondary | ICD-10-CM | POA: Diagnosis present

## 2023-07-28 DIAGNOSIS — E876 Hypokalemia: Secondary | ICD-10-CM | POA: Diagnosis not present

## 2023-07-28 DIAGNOSIS — I509 Heart failure, unspecified: Secondary | ICD-10-CM

## 2023-07-28 DIAGNOSIS — T796XXS Traumatic ischemia of muscle, sequela: Secondary | ICD-10-CM | POA: Diagnosis not present

## 2023-07-28 DIAGNOSIS — N1831 Chronic kidney disease, stage 3a: Secondary | ICD-10-CM | POA: Diagnosis present

## 2023-07-28 DIAGNOSIS — R531 Weakness: Secondary | ICD-10-CM | POA: Diagnosis present

## 2023-07-28 DIAGNOSIS — N189 Chronic kidney disease, unspecified: Secondary | ICD-10-CM | POA: Diagnosis present

## 2023-07-28 DIAGNOSIS — Z880 Allergy status to penicillin: Secondary | ICD-10-CM

## 2023-07-28 DIAGNOSIS — R1310 Dysphagia, unspecified: Secondary | ICD-10-CM | POA: Diagnosis present

## 2023-07-28 DIAGNOSIS — Z79899 Other long term (current) drug therapy: Secondary | ICD-10-CM

## 2023-07-28 DIAGNOSIS — Z6826 Body mass index (BMI) 26.0-26.9, adult: Secondary | ICD-10-CM

## 2023-07-28 HISTORY — DX: Unspecified dementia, unspecified severity, without behavioral disturbance, psychotic disturbance, mood disturbance, and anxiety: F03.90

## 2023-07-28 HISTORY — DX: Chronic kidney disease, unspecified: N18.9

## 2023-07-28 HISTORY — DX: Chronic obstructive pulmonary disease, unspecified: J44.9

## 2023-07-28 HISTORY — DX: Heart failure, unspecified: I50.9

## 2023-07-28 LAB — COMPREHENSIVE METABOLIC PANEL
ALT: 76 U/L — ABNORMAL HIGH (ref 0–44)
AST: 189 U/L — ABNORMAL HIGH (ref 15–41)
Albumin: 3.3 g/dL — ABNORMAL LOW (ref 3.5–5.0)
Alkaline Phosphatase: 47 U/L (ref 38–126)
Anion gap: 12 (ref 5–15)
BUN: 64 mg/dL — ABNORMAL HIGH (ref 8–23)
CO2: 26 mmol/L (ref 22–32)
Calcium: 8.4 mg/dL — ABNORMAL LOW (ref 8.9–10.3)
Chloride: 96 mmol/L — ABNORMAL LOW (ref 98–111)
Creatinine, Ser: 2.27 mg/dL — ABNORMAL HIGH (ref 0.61–1.24)
GFR, Estimated: 31 mL/min — ABNORMAL LOW (ref >60)
Glucose, Bld: 114 mg/dL — ABNORMAL HIGH (ref 70–99)
Potassium: 3.2 mmol/L — ABNORMAL LOW (ref 3.5–5.1)
Sodium: 134 mmol/L — ABNORMAL LOW (ref 135–145)
Total Bilirubin: 0.7 mg/dL (ref 0.0–1.2)
Total Protein: 7.5 g/dL (ref 6.5–8.1)

## 2023-07-28 LAB — CBC WITH DIFFERENTIAL/PLATELET
Abs Immature Granulocytes: 0.18 10*3/uL — ABNORMAL HIGH (ref 0.00–0.07)
Basophils Absolute: 0.1 10*3/uL (ref 0.0–0.1)
Basophils Relative: 1 %
Eosinophils Absolute: 0.1 10*3/uL (ref 0.0–0.5)
Eosinophils Relative: 0 %
HCT: 37.9 % — ABNORMAL LOW (ref 39.0–52.0)
Hemoglobin: 13 g/dL (ref 13.0–17.0)
Immature Granulocytes: 1 %
Lymphocytes Relative: 16 %
Lymphs Abs: 2 10*3/uL (ref 0.7–4.0)
MCH: 32.5 pg (ref 26.0–34.0)
MCHC: 34.3 g/dL (ref 30.0–36.0)
MCV: 94.8 fL (ref 80.0–100.0)
Monocytes Absolute: 1.4 10*3/uL — ABNORMAL HIGH (ref 0.1–1.0)
Monocytes Relative: 11 %
Neutro Abs: 8.8 10*3/uL — ABNORMAL HIGH (ref 1.7–7.7)
Neutrophils Relative %: 71 %
Platelets: 195 10*3/uL (ref 150–400)
RBC: 4 MIL/uL — ABNORMAL LOW (ref 4.22–5.81)
RDW: 14.6 % (ref 11.5–15.5)
WBC: 12.5 10*3/uL — ABNORMAL HIGH (ref 4.0–10.5)
nRBC: 0 % (ref 0.0–0.2)

## 2023-07-28 LAB — LACTIC ACID, PLASMA
Lactic Acid, Venous: 2.2 mmol/L (ref 0.5–1.9)
Lactic Acid, Venous: 1.3 mmol/L (ref 0.5–1.9)

## 2023-07-28 LAB — URINALYSIS, W/ REFLEX TO CULTURE (INFECTION SUSPECTED)
Bacteria, UA: NONE SEEN
Bilirubin Urine: NEGATIVE
Glucose, UA: >=500 mg/dL — AB
Ketones, ur: NEGATIVE mg/dL
Leukocytes,Ua: NEGATIVE
Nitrite: NEGATIVE
Protein, ur: NEGATIVE mg/dL
Specific Gravity, Urine: 1.031 — ABNORMAL HIGH (ref 1.005–1.030)
pH: 5 (ref 5.0–8.0)

## 2023-07-28 LAB — PROTIME-INR
INR: 1.1 (ref 0.8–1.2)
Prothrombin Time: 14 s (ref 11.4–15.2)

## 2023-07-28 LAB — RESP PANEL BY RT-PCR (RSV, FLU A&B, COVID)  RVPGX2
Influenza A by PCR: NEGATIVE
Influenza B by PCR: NEGATIVE
Resp Syncytial Virus by PCR: NEGATIVE
SARS Coronavirus 2 by RT PCR: NEGATIVE

## 2023-07-28 LAB — BLOOD GAS, VENOUS

## 2023-07-28 LAB — CK: Total CK: 3296 U/L — ABNORMAL HIGH (ref 49–397)

## 2023-07-28 LAB — PROCALCITONIN: Procalcitonin: 7.48 ng/mL

## 2023-07-28 MED ORDER — DIVALPROEX SODIUM ER 500 MG PO TB24
500.0000 mg | ORAL_TABLET | Freq: Every day | ORAL | Status: DC
Start: 2023-07-29 — End: 2023-07-30
  Administered 2023-07-29 – 2023-07-30 (×2): 500 mg via ORAL
  Filled 2023-07-28: qty 2
  Filled 2023-07-28: qty 1

## 2023-07-28 MED ORDER — METHYLPREDNISOLONE SODIUM SUCC 125 MG IJ SOLR
125.0000 mg | Freq: Once | INTRAMUSCULAR | Status: AC
  Administered 2023-07-28: 125 mg via INTRAVENOUS
  Filled 2023-07-28: qty 2

## 2023-07-28 MED ORDER — POLYETHYLENE GLYCOL 3350 17 G PO PACK: 17.0000 g | PACK | Freq: Every day | ORAL | Status: DC | PRN

## 2023-07-28 MED ORDER — ASPIRIN 81 MG PO TBEC
81.0000 mg | DELAYED_RELEASE_TABLET | Freq: Every day | ORAL | Status: DC
  Administered 2023-07-29 – 2023-08-19 (×21): 81 mg via ORAL
  Filled 2023-07-28 (×21): qty 1

## 2023-07-28 MED ORDER — ENOXAPARIN SODIUM 30 MG/0.3ML IJ SOSY
30.0000 mg | PREFILLED_SYRINGE | INTRAMUSCULAR | Status: DC
  Administered 2023-07-28: 30 mg via SUBCUTANEOUS
  Filled 2023-07-28 (×3): qty 0.3

## 2023-07-28 MED ORDER — FOLIC ACID 1 MG PO TABS
1.0000 mg | ORAL_TABLET | Freq: Every day | ORAL | Status: DC
  Administered 2023-07-29 – 2023-08-18 (×20): 1 mg via ORAL
  Filled 2023-07-28 (×20): qty 1

## 2023-07-28 MED ORDER — IOHEXOL 300 MG/ML  SOLN
75.0000 mL | Freq: Once | INTRAMUSCULAR | Status: AC | PRN
  Administered 2023-07-28: 75 mL via INTRAVENOUS

## 2023-07-28 MED ORDER — LACTATED RINGERS IV SOLN
150.0000 mL/h | INTRAVENOUS | Status: DC
  Administered 2023-07-28 – 2023-07-29 (×3): 150 mL/h via INTRAVENOUS

## 2023-07-28 MED ORDER — LACTATED RINGERS IV BOLUS
1000.0000 mL | Freq: Once | INTRAVENOUS | Status: AC
  Administered 2023-07-28: 1000 mL via INTRAVENOUS

## 2023-07-28 MED ORDER — IPRATROPIUM-ALBUTEROL 0.5-2.5 (3) MG/3ML IN SOLN
3.0000 mL | Freq: Four times a day (QID) | RESPIRATORY_TRACT | Status: DC
  Administered 2023-07-28 – 2023-08-01 (×14): 3 mL via RESPIRATORY_TRACT
  Filled 2023-07-28 (×5): qty 3
  Filled 2023-07-28: qty 9
  Filled 2023-07-28 (×8): qty 3

## 2023-07-28 MED ORDER — SODIUM CHLORIDE 0.9% FLUSH
3.0000 mL | Freq: Two times a day (BID) | INTRAVENOUS | Status: DC
  Administered 2023-07-28 – 2023-08-19 (×45): 3 mL via INTRAVENOUS

## 2023-07-28 MED ORDER — QUETIAPINE FUMARATE 25 MG PO TABS
50.0000 mg | ORAL_TABLET | Freq: Two times a day (BID) | ORAL | Status: DC
Start: 2023-07-29 — End: 2023-07-28

## 2023-07-28 MED ORDER — ONDANSETRON HCL 4 MG PO TABS
4.0000 mg | ORAL_TABLET | Freq: Four times a day (QID) | ORAL | Status: DC | PRN
Start: 2023-07-28 — End: 2023-08-19

## 2023-07-28 MED ORDER — PREDNISONE 20 MG PO TABS
40.0000 mg | ORAL_TABLET | Freq: Every day | ORAL | Status: DC
  Administered 2023-07-29 – 2023-07-30 (×2): 40 mg via ORAL
  Filled 2023-07-28 (×2): qty 2

## 2023-07-28 MED ORDER — ESCITALOPRAM OXALATE 10 MG PO TABS
5.0000 mg | ORAL_TABLET | Freq: Every day | ORAL | Status: DC
  Administered 2023-07-28 – 2023-08-18 (×22): 5 mg via ORAL
  Filled 2023-07-28 (×9): qty 1
  Filled 2023-07-28: qty 0.5
  Filled 2023-07-28 (×7): qty 1
  Filled 2023-07-28: qty 0.5
  Filled 2023-07-28 (×6): qty 1

## 2023-07-28 MED ORDER — IPRATROPIUM-ALBUTEROL 0.5-2.5 (3) MG/3ML IN SOLN
3.0000 mL | Freq: Once | RESPIRATORY_TRACT | Status: AC
  Administered 2023-07-28: 3 mL via RESPIRATORY_TRACT
  Filled 2023-07-28: qty 3

## 2023-07-28 MED ORDER — MEMANTINE HCL 5 MG PO TABS
5.0000 mg | ORAL_TABLET | Freq: Every day | ORAL | Status: DC
  Administered 2023-07-28 – 2023-07-30 (×3): 5 mg via ORAL
  Filled 2023-07-28 (×3): qty 1

## 2023-07-28 MED ORDER — SODIUM CHLORIDE 0.9 % IV SOLN
2.0000 g | Freq: Two times a day (BID) | INTRAVENOUS | Status: DC
  Administered 2023-07-28 – 2023-07-30 (×4): 2 g via INTRAVENOUS
  Filled 2023-07-28 (×5): qty 12.5

## 2023-07-28 MED ORDER — THIAMINE HCL 100 MG PO TABS
100.0000 mg | ORAL_TABLET | Freq: Every day | ORAL | Status: DC
  Administered 2023-07-29 – 2023-08-18 (×20): 100 mg via ORAL
  Filled 2023-07-28 (×37): qty 1

## 2023-07-28 MED ORDER — SODIUM CHLORIDE 0.9 % IV SOLN
500.0000 mg | Freq: Once | INTRAVENOUS | Status: AC
  Administered 2023-07-28: 500 mg via INTRAVENOUS
  Filled 2023-07-28: qty 5

## 2023-07-28 MED ORDER — QUETIAPINE FUMARATE 25 MG PO TABS
50.0000 mg | ORAL_TABLET | Freq: Every day | ORAL | Status: DC
  Administered 2023-07-29 – 2023-08-18 (×21): 50 mg via ORAL
  Filled 2023-07-28 (×21): qty 2

## 2023-07-28 MED ORDER — GABAPENTIN 100 MG PO CAPS
100.0000 mg | ORAL_CAPSULE | Freq: Three times a day (TID) | ORAL | Status: DC
  Administered 2023-07-28 – 2023-08-19 (×64): 100 mg via ORAL
  Filled 2023-07-28 (×64): qty 1

## 2023-07-28 MED ORDER — ONDANSETRON HCL 4 MG/2ML IJ SOLN: 4.0000 mg | Freq: Four times a day (QID) | INTRAMUSCULAR | Status: DC | PRN

## 2023-07-28 MED ORDER — ACETAMINOPHEN 650 MG RE SUPP: 650.0000 mg | Freq: Four times a day (QID) | RECTAL | Status: DC | PRN

## 2023-07-28 MED ORDER — QUETIAPINE FUMARATE 100 MG PO TABS
150.0000 mg | ORAL_TABLET | Freq: Every day | ORAL | Status: DC
  Administered 2023-07-28 – 2023-08-18 (×22): 150 mg via ORAL
  Filled 2023-07-28 (×3): qty 6
  Filled 2023-07-28: qty 1.5
  Filled 2023-07-28: qty 6
  Filled 2023-07-28 (×2): qty 1.5
  Filled 2023-07-28 (×6): qty 6
  Filled 2023-07-28: qty 1.5
  Filled 2023-07-28: qty 6
  Filled 2023-07-28 (×3): qty 1.5
  Filled 2023-07-28 (×3): qty 6
  Filled 2023-07-28 (×2): qty 1.5
  Filled 2023-07-28: qty 6
  Filled 2023-07-28 (×2): qty 1.5
  Filled 2023-07-28: qty 6
  Filled 2023-07-28 (×2): qty 1.5
  Filled 2023-07-28 (×2): qty 6
  Filled 2023-07-28 (×2): qty 1.5

## 2023-07-28 MED ORDER — VITAMIN B-6 100 MG PO TABS
100.0000 mg | ORAL_TABLET | Freq: Every day | ORAL | Status: DC
  Administered 2023-07-29 – 2023-08-18 (×20): 100 mg via ORAL
  Filled 2023-07-28 (×24): qty 1

## 2023-07-28 MED ORDER — DIVALPROEX SODIUM ER 250 MG PO TB24: 500.0000 mg | ORAL_TABLET | Freq: Two times a day (BID) | ORAL | Status: DC

## 2023-07-28 MED ORDER — SODIUM CHLORIDE 0.9 % IV SOLN
2.0000 g | Freq: Once | INTRAVENOUS | Status: AC
  Administered 2023-07-28: 2 g via INTRAVENOUS
  Filled 2023-07-28: qty 20

## 2023-07-28 MED ORDER — SODIUM CHLORIDE 0.9 % IV SOLN
500.0000 mg | INTRAVENOUS | Status: AC
  Administered 2023-07-29 – 2023-08-01 (×4): 500 mg via INTRAVENOUS
  Filled 2023-07-28 (×4): qty 5

## 2023-07-28 MED ORDER — DIVALPROEX SODIUM ER 500 MG PO TB24
1000.0000 mg | ORAL_TABLET | Freq: Every day | ORAL | Status: DC
  Administered 2023-07-28 – 2023-07-29 (×2): 1000 mg via ORAL
  Filled 2023-07-28: qty 4
  Filled 2023-07-28 (×2): qty 2

## 2023-07-28 MED ORDER — QUETIAPINE FUMARATE 25 MG PO TABS
50.0000 mg | ORAL_TABLET | Freq: Every day | ORAL | Status: DC
  Administered 2023-07-29 – 2023-08-19 (×21): 50 mg via ORAL
  Filled 2023-07-28 (×21): qty 2

## 2023-07-28 MED ORDER — SODIUM CHLORIDE 0.9 % IV BOLUS
1000.0000 mL | Freq: Once | INTRAVENOUS | Status: AC
  Administered 2023-07-28: 1000 mL via INTRAVENOUS

## 2023-07-28 MED ORDER — ACETAMINOPHEN 325 MG PO TABS
650.0000 mg | ORAL_TABLET | Freq: Four times a day (QID) | ORAL | Status: DC | PRN
  Administered 2023-08-11 – 2023-08-15 (×2): 650 mg via ORAL
  Filled 2023-07-28 (×2): qty 2

## 2023-07-28 NOTE — ED Notes (Addendum)
 Notified MD Basaraba of low Bps. Last BP 85/63 (70). Patient currently receiving 159mL/hr of LR. Patient AOX2. No new orders at this time.

## 2023-07-28 NOTE — Consult Note (Signed)
  Pharmacy Antibiotic Note  Paul Vaughan is a 68 y.o. male admitted on 07/28/2023 with pneumonia.  Pharmacy has been consulted for cefepime dosing.  WBC 12.5, afebrile O2 88% on RA>>started on 3L   Plan: Start cefepime 2 grams IV every 12 hours Azithromycin 500 mg IV every 24 hours per provider Follow renal function and cultures for adjustments  Height: 5\' 8"  (172.7 cm) Weight: 81.2 kg (179 lb) IBW/kg (Calculated) : 68.4  Temp (24hrs), Avg:98.6 F (37 C), Min:98.6 F (37 C), Max:98.6 F (37 C)  Recent Labs  Lab 07/28/23 1027  WBC 12.5*  CREATININE 2.27*  LATICACIDVEN 2.2*    Estimated Creatinine Clearance: 30.1 mL/min (A) (by C-G formula based on SCr of 2.27 mg/dL (H)).    Allergies  Allergen Reactions   Penicillins Rash and Swelling    .Has patient had a PCN reaction causing immediate rash, facial/tongue/throat swelling, SOB or lightheadedness with hypotension: Unknown Has patient had a PCN reaction causing severe rash involving mucus membranes or skin necrosis: Unknown Has patient had a PCN reaction that required hospitalization: Unknown Has patient had a PCN reaction occurring within the last 10 years: Unknown If all of the above answers are "NO", then may proceed with Cephalosporin use.    Sulfa Antibiotics Rash and Swelling    Antimicrobials this admission: azithromycin 3/16 >>  cefepime 3/16 >>   Dose adjustments this admission: N/A  Microbiology results: 3/16 BCx: pending  Thank you for allowing pharmacy to be a part of this patient's care.  Barrie Folk, PharmD 07/28/2023 1:56 PM

## 2023-07-28 NOTE — Assessment & Plan Note (Addendum)
 In the setting of sepsis, poor p.o. intake and dehydration.  Previous history of CKD stage IIIa, with creatinine ranging between 1 and 1.6.  He is on Jardiance for associated proteinuria  - IV fluids as ordered - Recheck BMP in the a.m. - Monitor for urinary retention - Hold home nephrotoxic agents

## 2023-07-28 NOTE — Assessment & Plan Note (Addendum)
 Per chart review, patient has a history of undifferentiated dementia in addition to borderline intellectual disability.

## 2023-07-28 NOTE — Assessment & Plan Note (Signed)
 Present on admission with bilateral pneumonia, hypotension, leukocytosis, tachycardia elevated lactic acid and acute hypoxic respiratory failure and acute kidney injury.  Continue IVF.  Continue IV antibiotics.

## 2023-07-28 NOTE — ED Notes (Signed)
 Pt was lying in bed resting with eyes closed. Pt soiled entire bed with urine. Soiled clothing removed, skin care provided, diaper on pt, bed linens changed.

## 2023-07-28 NOTE — Assessment & Plan Note (Signed)
 Patient with a pulse ox of 88% on room air.  Likely secondary to sepsis and pneumonia.  Today with worsening oxygenation.  ABG reviewed.  Chest x-ray reviewed looks like aspiration.  Placed on nonrebreather.  Will transfer to the stepdown unit for closer monitoring.  Critical care team consultation.

## 2023-07-28 NOTE — Assessment & Plan Note (Signed)
 In the setting of pneumonia.  - DuoNebs every 6 hours - S/p Solu-Medrol 125 mg once - Continue prednisone 40 mg daily

## 2023-07-28 NOTE — ED Notes (Signed)
 Patient transported to CT

## 2023-07-28 NOTE — Assessment & Plan Note (Signed)
 Hold home antihypertensives

## 2023-07-28 NOTE — Assessment & Plan Note (Signed)
 Likely secondary to sepsis.

## 2023-07-28 NOTE — ED Provider Notes (Signed)
 [facility] Provider Note    Event Date/Time   First MD Initiated Contact with Patient 07/28/23 1030     (approximate)   History   Weakness   HPI  Kobey Sides is a 68 y.o. male who presents to the ED for evaluation of Weakness   Review of cardiology clinic visit from October.  History of HTN, CKD 3, COPD, DM.  History of dementia, resides at a local group home.  Patient presents to the ED with a caregiver from his group home.  Reports he has not been acting himself for the past few days, more weak.  Not making it to the bathroom with both stool and urinary incontinence, which are both new for the patient.  History is limited from the patient, all history comes from group home caregiver who has known him for a few years, patient denies any complaints.   Physical Exam   Triage Vital Signs: ED Triage Vitals  Encounter Vitals Group     BP 07/28/23 1022 (!) 64/48     Systolic BP Percentile --      Diastolic BP Percentile --      Pulse Rate 07/28/23 1022 (!) 106     Resp 07/28/23 1022 20     Temp 07/28/23 1022 98.6 F (37 C)     Temp src --      SpO2 07/28/23 1022 (!) 88 %     Weight 07/28/23 1022 179 lb (81.2 kg)     Height 07/28/23 1022 5\' 8"  (1.727 m)     Head Circumference --      Peak Flow --      Pain Score 07/28/23 1025 0     Pain Loc --      Pain Education --      Exclude from Growth Chart --     Most recent vital signs: Vitals:   07/28/23 1200 07/28/23 1230  BP: 114/68 117/73  Pulse: 94 99  Resp: 20 16  Temp:    SpO2: 100% 99%    General: Awake, no distress.  CV:  Good peripheral perfusion.  Tachycardic and regular, soft blood pressures Resp:  Mild tachypnea, requiring 2 L nasal cannula, wheezing throughout and slightly decreased airflow Abd:  No distention.  Lower abdominal tenderness without peritoneal features MSK:  No deformity noted.  Neuro:  No focal deficits appreciated. Other:     ED Results / Procedures /  Treatments   Labs (all labs ordered are listed, but only abnormal results are displayed) Labs Reviewed  COMPREHENSIVE METABOLIC PANEL - Abnormal; Notable for the following components:      Result Value   Sodium 134 (*)    Potassium 3.2 (*)    Chloride 96 (*)    Glucose, Bld 114 (*)    BUN 64 (*)    Creatinine, Ser 2.27 (*)    Calcium 8.4 (*)    Albumin 3.3 (*)    AST 189 (*)    ALT 76 (*)    GFR, Estimated 31 (*)    All other components within normal limits  LACTIC ACID, PLASMA - Abnormal; Notable for the following components:   Lactic Acid, Venous 2.2 (*)    All other components within normal limits  CBC WITH DIFFERENTIAL/PLATELET - Abnormal; Notable for the following components:   WBC 12.5 (*)    RBC 4.00 (*)    HCT 37.9 (*)    Neutro Abs 8.8 (*)    Monocytes Absolute 1.4 (*)  Abs Immature Granulocytes 0.18 (*)    All other components within normal limits  URINALYSIS, W/ REFLEX TO CULTURE (INFECTION SUSPECTED) - Abnormal; Notable for the following components:   Color, Urine AMBER (*)    APPearance HAZY (*)    Specific Gravity, Urine 1.031 (*)    Glucose, UA >=500 (*)    Hgb urine dipstick MODERATE (*)    All other components within normal limits  BLOOD GAS, VENOUS - Abnormal; Notable for the following components:   pO2, Ven <31 (*)    Bicarbonate 30.3 (*)    Acid-Base Excess 4.3 (*)    All other components within normal limits  CULTURE, BLOOD (ROUTINE X 2)  CULTURE, BLOOD (ROUTINE X 2)  LACTIC ACID, PLASMA  PROTIME-INR    EKG Sinus rhythm with a rate of 96 bpm.  Normal axis and intervals.  No for signs of acute ischemia.  RADIOLOGY 2 view CXR interpreted by me with apical opacities in the right side CT head and/pelvis interpreted by me without evidence of acute intra-abdominal pathology  Official radiology report(s): DG Chest 2 View Result Date: 07/28/2023 CLINICAL DATA:  Suspected sepsis. Poor appetite. Generalized weakness. Episodes of urinary  incontinence. Intermittent altered mental status. EXAM: CHEST - 2 VIEW COMPARISON:  Chest radiograph 07/28/2021 and 12/13/2019, CT cervical spine 11/17/2019 FINDINGS: Cardiac silhouette and mediastinal contours are within normal limits. Mild-to-moderate atherosclerotic calcification within the aortic arch. There are mildly to moderately decreased lung volumes. There is chronic bilateral interstitial thickening and reticular opacification within the peripheral right-greater-than-left lungs, greatest within the superolateral right lung and next most severe within the inferolateral right lung. This is in the same distribution as on 06/30/2021 and 12/13/2019, however the opacities are worsened from prior. On 11/17/2019 cervical spine radiographs high-grade emphysematous changes and interlobular septal thickening is seen within the minimally visualized lung apices. No pleural effusion or pneumothorax. Mild multilevel degenerative disc changes of the thoracic spine. Mild elevation of the right hemidiaphragm is unchanged. IMPRESSION: Chronic bilateral interstitial thickening and reticular opacification within the peripheral right-greater-than-left lungs, greatest within the superolateral right lung. This is in the same distribution as interstitial thickening and airspace opacity on 06/30/2021 and 12/13/2019, however the opacities are worsened from prior. This may represent worsening chronic interstitial lung disease. It is difficult to exclude a superimposed right apical pneumonia. Electronically Signed   By: Neita Garnet M.D.   On: 07/28/2023 11:13    PROCEDURES and INTERVENTIONS:  .1-3 Lead EKG Interpretation  Performed by: Delton Prairie, MD Authorized by: Delton Prairie, MD     Interpretation: abnormal     ECG rate:  110   ECG rate assessment: tachycardic     Rhythm: sinus tachycardia     Ectopy: none     Conduction: normal   .Critical Care  Performed by: Delton Prairie, MD Authorized by: Delton Prairie, MD    Critical care provider statement:    Critical care time (minutes):  30   Critical care time was exclusive of:  Separately billable procedures and treating other patients   Critical care was time spent personally by me on the following activities:  Development of treatment plan with patient or surrogate, discussions with consultants, evaluation of patient's response to treatment, examination of patient, ordering and review of laboratory studies, ordering and review of radiographic studies, ordering and performing treatments and interventions, pulse oximetry, re-evaluation of patient's condition and review of old charts   Medications  lactated ringers bolus 1,000 mL (0 mLs Intravenous Stopped 07/28/23  1255)  ipratropium-albuterol (DUONEB) 0.5-2.5 (3) MG/3ML nebulizer solution 3 mL (3 mLs Nebulization Given 07/28/23 1104)  methylPREDNISolone sodium succinate (SOLU-MEDROL) 125 mg/2 mL injection 125 mg (125 mg Intravenous Given 07/28/23 1104)  cefTRIAXone (ROCEPHIN) 2 g in sodium chloride 0.9 % 100 mL IVPB (0 g Intravenous Stopped 07/28/23 1210)  azithromycin (ZITHROMAX) 500 mg in sodium chloride 0.9 % 250 mL IVPB (0 mg Intravenous Stopped 07/28/23 1255)  iohexol (OMNIPAQUE) 300 MG/ML solution 75 mL (75 mLs Intravenous Contrast Given 07/28/23 1140)  sodium chloride 0.9 % bolus 1,000 mL (1,000 mLs Intravenous New Bag/Given 07/28/23 1201)     IMPRESSION / MDM / ASSESSMENT AND PLAN / ED COURSE  I reviewed the triage vital signs and the nursing notes.  Differential diagnosis includes, but is not limited to, COPD exacerbation, sepsis, hypovolemia, UTI, diverticulitis, hypercarbia  {Patient presents with symptoms of an acute illness or injury that is potentially life-threatening.  Patient presents to the ED for evaluation of generalized weakness, cough with evidence of severe sepsis from pneumonia, AKI requiring medical admission.  Initially hypotensive but responsive to IV fluids without signs of septic  shock.  Lactic acidosis of 2.2.  Leukocytosis.  AKI on CKD.  XR with acute on chronic infiltrates.  CT abdomen/pelvis without acute features.  Provide CAP coverage antibiotics, IV fluids and consult medicine for admission.  COPD exacerbation with steroids and DuoNebs and improvement of his respiratory status.  Clinical Course as of 07/28/23 1304  Sun Jul 28, 2023  1300 Consult medicine who agrees to admit [DS]    Clinical Course User Index [DS] Delton Prairie, MD     FINAL CLINICAL IMPRESSION(S) / ED DIAGNOSES   Final diagnoses:  Generalized weakness  AKI (acute kidney injury) [facility])  Community acquired pneumonia of right upper lobe of lung     Rx / DC Orders   ED Discharge Orders     None        Note:  This document was prepared using Dragon voice recognition software and may include unintentional dictation errors.   Delton Prairie, MD 07/28/23 [phone]

## 2023-07-28 NOTE — ED Notes (Signed)
 Was unable to update PMH in triage.

## 2023-07-28 NOTE — Assessment & Plan Note (Signed)
 Per chart review, Patient has a history of HFpEF with last EF of 55% with grade 1 diastolic dysfunction.  He appears hypovolemic at this time.  - Daily weights

## 2023-07-28 NOTE — H&P (Signed)
 History and Physical    Patient: Paul Vaughan NFA:[id number] DOB: 08/23/1955 DOA: 07/28/2023 DOS: the patient was seen and examined on 07/28/2023 PCP: Franciso Bend, NP  Patient coming from: ALF/ILF  Chief Complaint:  Chief Complaint  Patient presents with   Weakness   HPI: Paul Vaughan is a 68 y.o. male with medical history significant of dementia, CKD stage IIIa, COPD, CHF, who presents to the ED due to generalized weakness.  History obtained through chart review due to patient's underlying dementia. Caregiver previously at bedside noted patient has been experiencing increased generalized weakness with poor appetite and episodes of urinary continence with increasing altered mental status over the last few days.  No reported fevers, vomiting, shortness of breath, chest pain, diarrhea.  ED course: On arrival to the ED, patient was hypotensive at 86/53 with heart rate of 90.  He was saturating at 88% on room air and subsequently placed on 3 L.  He was afebrile at 98.6.  Initial workup notable for WBC 12.5, BUN 64, creatinine 2.27, AST 189, ALT 76, GFR of 31.  Urinalysis with no leukocytes, nitrites or bacteria.  Chest x-ray with chronic interstitial lung changes, however increased opacities compared to prior with possible right apical pneumonia.  CT of the abdomen with no acute findings.  Patient started on IV fluids, azithromycin, ceftriaxone, DuoNebs, and Solu-Medrol.  TRH contacted for admission.  Review of Systems: As mentioned in the history of present illness. All other systems reviewed and are negative.  No past medical history on file.  No past surgical history on file.  Social History:  reports that he has been smoking cigarettes. He has never used smokeless tobacco. He reports that he does not drink alcohol and does not use drugs.  Allergies  Allergen Reactions   Penicillins Rash and Swelling    .Has patient had a PCN reaction causing immediate rash,  facial/tongue/throat swelling, SOB or lightheadedness with hypotension: Unknown Has patient had a PCN reaction causing severe rash involving mucus membranes or skin necrosis: Unknown Has patient had a PCN reaction that required hospitalization: Unknown Has patient had a PCN reaction occurring within the last 10 years: Unknown If all of the above answers are "NO", then may proceed with Cephalosporin use.    Sulfa Antibiotics Rash and Swelling    No family history on file.  Prior to Admission medications   Medication Sig Start Date End Date Taking? Authorizing Provider  albuterol (VENTOLIN HFA) 108 (90 Base) MCG/ACT inhaler Inhale 2 puffs into the lungs every 6 (six) hours as needed for wheezing or shortness of breath.    [provider]  aspirin EC 81 MG tablet Take 81 mg by mouth daily. Swallow whole.    [provider]  divalproex (DEPAKOTE ER) 500 MG 24 hr tablet Take 500-1,000 mg by mouth 2 (two) times daily. 500 mg every morning and 1000 mg at bedtime 06/05/21   [provider]  escitalopram (LEXAPRO) 5 MG tablet Take 5 mg by mouth at bedtime. 06/05/21   [provider]  folic acid (FOLVITE) 1 MG tablet Take 1 mg by mouth daily at 12 noon.    [provider]  gabapentin (NEURONTIN) 100 MG capsule Take 100 mg by mouth 3 (three) times daily. 100 mg twice daily 300 mg nightly 06/05/21   [provider]  JARDIANCE 10 MG TABS tablet Take 10 mg by mouth daily.    [provider]  lisinopril (ZESTRIL) 5 MG tablet Take 5 mg by  mouth daily. 06/05/21   [provider]  memantine (NAMENDA) 5 MG tablet Take 5 mg by mouth at bedtime. 06/05/21   [provider]  pyridOXINE (VITAMIN B-6) 100 MG tablet Take 100 mg by mouth at bedtime.    [provider]  QUEtiapine (SEROQUEL) 100 MG tablet Take 150 mg by mouth at bedtime. 06/05/21   [provider]  QUEtiapine (SEROQUEL) 50 MG tablet Take 50-100 mg by mouth 2  (two) times daily. 50 mg every morning and 100 mg at 2 pm 06/05/21   [provider]  rosuvastatin (CRESTOR) 20 MG tablet Take 20 mg by mouth at bedtime. 06/05/21   [provider]  thiamine 100 MG tablet Take 100 mg by mouth daily at 12 noon.    [provider]    Physical Exam: Vitals:   07/28/23 1100 07/28/23 1130 07/28/23 1200 07/28/23 1230  BP: (!) 86/53 (!) 99/59 114/68 117/73  Pulse: 95 96 94 99  Resp: 17 19 20 16   Temp:      SpO2: 97% 91% 100% 99%  Weight:      Height:       Physical Exam Vitals and nursing note reviewed.  Constitutional:      Appearance: He is ill-appearing.  HENT:     Head: Normocephalic and atraumatic.     Mouth/Throat:     Mouth: Mucous membranes are dry.     Pharynx: Oropharynx is clear.  Eyes:     Conjunctiva/sclera: Conjunctivae normal.     Pupils: Pupils are equal, round, and reactive to light.  Cardiovascular:     Rate and Rhythm: Normal rate and regular rhythm.     Heart sounds: No murmur heard. Pulmonary:     Effort: Pulmonary effort is normal. No tachypnea.     Breath sounds: Rhonchi (Diffuse expiratory rhonchi throughout) present.  Abdominal:     General: Bowel sounds are normal. There is no distension.     Palpations: Abdomen is soft.     Tenderness: There is no abdominal tenderness.  Musculoskeletal:     Right lower leg: No edema.     Left lower leg: No edema.  Skin:    General: Skin is warm and dry.  Neurological:     Mental Status: He is alert.     Comments:  Patient is oriented only to self, not situation, time or place.  Can follow commands and moving all extremities    Data Reviewed: CBC with WBC of 12.5, hemoglobin of 13.0, MCV of 94, platelets 195 CMP with sodium of 134, potassium 3.2, bicarb 26, glucose 114, BUN 64, creatinine 2.27, AST 189, ALT 76, GFR 31 Lactic acid 2.2 VBG with pH of 7.39 and pCO2 of 50 Lactic acid 2.2 Urinalysis with glucosuria, moderate hematuria but no RBC/hpf  EKG  personally reviewed.  Sinus rhythm with rate of 96.  No acute ischemic changes.  CT ABDOMEN PELVIS W CONTRAST Result Date: 07/28/2023 CLINICAL DATA:  Diarrhea, lower abdominal pain, stool in urinary incontinence. EXAM: CT ABDOMEN AND PELVIS WITH CONTRAST TECHNIQUE: Multidetector CT imaging of the abdomen and pelvis was performed using the standard protocol following bolus administration of intravenous contrast. RADIATION DOSE REDUCTION: This exam was performed according to the departmental dose-optimization program which includes automated exposure control, adjustment of the mA and/or kV according to patient size and/or use of iterative reconstruction technique. CONTRAST:  75mL OMNIPAQUE IOHEXOL 300 MG/ML  SOLN COMPARISON:  None. FINDINGS: Lower chest: Trace right pleural fluid. Increased peribronchovascular  opacities within the right lower lung compatible with sequelae of inflammation or infection. No lobar consolidation. Hepatobiliary: No focal liver abnormality is seen. No gallstones, gallbladder wall thickening, or biliary dilatation. Pancreas: Unremarkable. No pancreatic ductal dilatation or surrounding inflammatory changes. Spleen: Normal in size without focal abnormality. Adrenals/Urinary Tract: Normal adrenal glands. Anterior left upper pole renal cyst measures 3.2 cm, image 29/2. No follow-up imaging recommended. No nephrolithiasis or obstructive uropathy. Urinary bladder appears normal. Stomach/Bowel: Stomach appears nondistended. The appendix is visualized and appears normal. No pathologic dilatation of the large or small bowel loops. No bowel wall thickening, inflammation, or distension. Sigmoid diverticulosis without signs of acute diverticulitis. Vascular/Lymphatic: Aortic atherosclerosis. The upper abdominal vascularity appears patent. No signs of abdominopelvic adenopathy. Reproductive: Prostate is unremarkable. Other: There is no free fluid or fluid collections. No signs of pneumoperitoneum.  Musculoskeletal: No acute or significant osseous findings. IMPRESSION: 1. No acute findings within the abdomen or pelvis. 2. Sigmoid diverticulosis without signs of acute diverticulitis. 3. Trace right pleural fluid. 4. Increased peribronchovascular opacities within the right lower lung compatible with sequelae of inflammation/infection versus chronic aspiration. 5.  Aortic Atherosclerosis (ICD10-I70.0). Electronically Signed   By: Signa Kell M.D.   On: 07/28/2023 12:08   DG Chest 2 View Result Date: 07/28/2023 CLINICAL DATA:  Suspected sepsis. Poor appetite. Generalized weakness. Episodes of urinary incontinence. Intermittent altered mental status. EXAM: CHEST - 2 VIEW COMPARISON:  Chest radiograph 07/28/2021 and 12/13/2019, CT cervical spine 11/17/2019 FINDINGS: Cardiac silhouette and mediastinal contours are within normal limits. Mild-to-moderate atherosclerotic calcification within the aortic arch. There are mildly to moderately decreased lung volumes. There is chronic bilateral interstitial thickening and reticular opacification within the peripheral right-greater-than-left lungs, greatest within the superolateral right lung and next most severe within the inferolateral right lung. This is in the same distribution as on 06/30/2021 and 12/13/2019, however the opacities are worsened from prior. On 11/17/2019 cervical spine radiographs high-grade emphysematous changes and interlobular septal thickening is seen within the minimally visualized lung apices. No pleural effusion or pneumothorax. Mild multilevel degenerative disc changes of the thoracic spine. Mild elevation of the right hemidiaphragm is unchanged. IMPRESSION: Chronic bilateral interstitial thickening and reticular opacification within the peripheral right-greater-than-left lungs, greatest within the superolateral right lung. This is in the same distribution as interstitial thickening and airspace opacity on 06/30/2021 and 12/13/2019, however the  opacities are worsened from prior. This may represent worsening chronic interstitial lung disease. It is difficult to exclude a superimposed right apical pneumonia. Electronically Signed   By: Neita Garnet M.D.   On: 07/28/2023 11:13   Results are pending, will review when available.  Assessment and Plan:  * Sepsis [facility]) Patient presented with hypotension, leukocytosis, elevated lactic acid with concerns for pneumonia.  Complicated by acute hypoxic respiratory failure, acute kidney injury, and elevated LFTs.  - Telemetry monitoring - S/p Rocephin and azithromycin - S/p 2 L bolus.  Continue maintenance fluids - Blood cultures - Procalcitonin  CAP (community acquired pneumonia) Onarrival, patient was noted to be hypoxic at 88%.  Chest x-ray with chronic interstitial changes, however acute worsening compared to prior.  CT of the abdomen captured the basilar regions of the lung showing increased opacities in the right lung base concerning for sequelae of inflammation versus chronic aspiration.  Given acute symptoms, this may be an acute aspiration pneumonia versus typical community-acquired pneumonia.  - Continue azithromycin - Switch from Rocephin to cefepime given significant structural lung disease - Procalcitonin - Respiratory viral panel  Acute hypoxic  respiratory failure (HCC) Due to sepsis and pneumonia.  - Continue supplemental oxygen to maintain oxygen saturation above 88% - Wean as tolerated  Acute kidney injury superimposed on CKD (HCC) In the setting of sepsis, poor p.o. intake and dehydration.  Previous history of CKD stage IIIa, with creatinine ranging between 1 and 1.6.  He is on Jardiance for associated proteinuria  - IV fluids as ordered - Recheck BMP in the a.m. - Monitor for urinary retention - Hold home nephrotoxic agents  Elevated LFTs Mildly elevated LFTs with unclear etiology, however may be related to transient hypotension.  No abdominal pain or acute  findings on CT imaging.  - Recheck CMP in the a.m.  Chronic obstructive pulmonary disease, unspecified (HCC) In the setting of pneumonia.  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  At this time, he is oriented only to self.  Caregiver no longer at bedside to clarify if this is baseline.  - Delirium precautions - Continue home regimen  Essential (primary) hypertension - Hold home antihypertensives given hypotension  Congestive heart failure (HCC) Per chart review, Patient has a history of HFpEF with last EF of 55% with grade 1 diastolic dysfunction.  He appears hypovolemic at this time.  - Daily weights  Advance Care Planning:   Code Status: Full Code as patient is unable to make medical decisions and legal guardian is not at bedside  Consults: None  Family Communication: No family or caregiver at bedside  Severity of Illness: The appropriate patient status for this patient is INPATIENT. Inpatient status is judged to be reasonable and necessary in order to provide the required intensity of service to ensure the patient's safety. The patient's presenting symptoms, physical exam findings, and initial radiographic and laboratory data in the context of their chronic comorbidities is felt to place them at high risk for further clinical deterioration. Furthermore, it is not anticipated that the patient will be medically stable for discharge from the hospital within 2 midnights of admission.   * I certify that at the point of admission it is my clinical judgment that the patient will require inpatient hospital care spanning beyond 2 midnights from the point of admission due to high intensity of service, high risk for further deterioration and high frequency of  surveillance required.*  Author: Verdene Lennert, MD 07/28/2023 2:34 PM  For on call review [email or web address].

## 2023-07-28 NOTE — ED Triage Notes (Signed)
 Pt to ED with caregiver from [facility] for generalized weakness, poor appetite, episodes of urinary incontinence and intermittent AMS since Has been "wobbly" and missed bed when was going to sit, so fell onto buttocks yesterday. Pt is pale (per caregiver this is his normal appearance).   Hypotensive in triage, took twice. 65/48, called for room. Placed on 2L

## 2023-07-28 NOTE — Assessment & Plan Note (Signed)
 Onarrival, patient was noted to be hypoxic at 88%.  Chest x-ray with chronic interstitial changes, however acute worsening compared to prior.  CT of the abdomen captured the basilar regions of the lung showing increased opacities in the right lung base concerning for sequelae of inflammation versus chronic aspiration.  Given acute symptoms, this may be an acute aspiration pneumonia versus typical community-acquired pneumonia.  - Continue azithromycin - Switch from Rocephin to cefepime given significant structural lung disease - Procalcitonin - Respiratory viral panel

## 2023-07-29 ENCOUNTER — Encounter: Payer: Self-pay | Admitting: Internal Medicine

## 2023-07-29 DIAGNOSIS — A419 Sepsis, unspecified organism: Secondary | ICD-10-CM | POA: Diagnosis not present

## 2023-07-29 DIAGNOSIS — T796XXS Traumatic ischemia of muscle, sequela: Secondary | ICD-10-CM

## 2023-07-29 DIAGNOSIS — M6282 Rhabdomyolysis: Secondary | ICD-10-CM | POA: Insufficient documentation

## 2023-07-29 DIAGNOSIS — N179 Acute kidney failure, unspecified: Secondary | ICD-10-CM | POA: Diagnosis not present

## 2023-07-29 DIAGNOSIS — I5032 Chronic diastolic (congestive) heart failure: Secondary | ICD-10-CM | POA: Insufficient documentation

## 2023-07-29 DIAGNOSIS — J189 Pneumonia, unspecified organism: Secondary | ICD-10-CM | POA: Diagnosis not present

## 2023-07-29 DIAGNOSIS — J9601 Acute respiratory failure with hypoxia: Secondary | ICD-10-CM | POA: Diagnosis not present

## 2023-07-29 LAB — CBC WITH DIFFERENTIAL/PLATELET
Abs Immature Granulocytes: 0.17 10*3/uL — ABNORMAL HIGH (ref 0.00–0.07)
Basophils Absolute: 0 10*3/uL (ref 0.0–0.1)
Basophils Relative: 0 %
Eosinophils Absolute: 0 10*3/uL (ref 0.0–0.5)
Eosinophils Relative: 0 %
HCT: 28.2 % — ABNORMAL LOW (ref 39.0–52.0)
Hemoglobin: 9.8 g/dL — ABNORMAL LOW (ref 13.0–17.0)
Immature Granulocytes: 2 %
Lymphocytes Relative: 23 %
Lymphs Abs: 1.6 10*3/uL (ref 0.7–4.0)
MCH: 32.2 pg (ref 26.0–34.0)
MCHC: 34.8 g/dL (ref 30.0–36.0)
MCV: 92.8 fL (ref 80.0–100.0)
Monocytes Absolute: 0.4 10*3/uL (ref 0.1–1.0)
Monocytes Relative: 5 %
Neutro Abs: 4.9 10*3/uL (ref 1.7–7.7)
Neutrophils Relative %: 70 %
Platelets: 149 10*3/uL — ABNORMAL LOW (ref 150–400)
RBC: 3.04 MIL/uL — ABNORMAL LOW (ref 4.22–5.81)
RDW: 14.8 % (ref 11.5–15.5)
WBC: 7 10*3/uL (ref 4.0–10.5)
nRBC: 0 % (ref 0.0–0.2)

## 2023-07-29 LAB — COMPREHENSIVE METABOLIC PANEL
ALT: 62 U/L — ABNORMAL HIGH (ref 0–44)
AST: 134 U/L — ABNORMAL HIGH (ref 15–41)
Albumin: 2.3 g/dL — ABNORMAL LOW (ref 3.5–5.0)
Alkaline Phosphatase: 36 U/L — ABNORMAL LOW (ref 38–126)
Anion gap: 10 (ref 5–15)
BUN: 49 mg/dL — ABNORMAL HIGH (ref 8–23)
CO2: 25 mmol/L (ref 22–32)
Calcium: 8 mg/dL — ABNORMAL LOW (ref 8.9–10.3)
Chloride: 104 mmol/L (ref 98–111)
Creatinine, Ser: 1.33 mg/dL — ABNORMAL HIGH (ref 0.61–1.24)
GFR, Estimated: 58 mL/min — ABNORMAL LOW (ref >60)
Glucose, Bld: 138 mg/dL — ABNORMAL HIGH (ref 70–99)
Potassium: 3.8 mmol/L (ref 3.5–5.1)
Sodium: 139 mmol/L (ref 135–145)
Total Bilirubin: 0.5 mg/dL (ref 0.0–1.2)
Total Protein: 5.6 g/dL — ABNORMAL LOW (ref 6.5–8.1)

## 2023-07-29 LAB — BLOOD GAS, VENOUS
Bicarbonate: 30.3 mmol/L — ABNORMAL HIGH (ref 20.0–28.0)
O2 Saturation: 15.4 mmol/L — ABNORMAL HIGH (ref 0.0–2.0)
Patient temperature: 37
Patient temperature: 37 %
pCO2, Ven: 50 mmHg (ref 44–60)
pH, Ven: 7.39 (ref 7.25–7.43)
pO2, Ven: <31 mmol/L — CL (ref 32–45)

## 2023-07-29 LAB — STREP PNEUMONIAE URINARY ANTIGEN: Strep Pneumo Urinary Antigen: POSITIVE — AB

## 2023-07-29 LAB — HIV ANTIBODY (ROUTINE TESTING W REFLEX): HIV Screen 4th Generation wRfx: NONREACTIVE

## 2023-07-29 MED ORDER — ENOXAPARIN SODIUM 40 MG/0.4ML IJ SOSY
40.0000 mg | PREFILLED_SYRINGE | INTRAMUSCULAR | Status: DC
  Administered 2023-07-29 – 2023-08-18 (×21): 40 mg via SUBCUTANEOUS
  Filled 2023-07-29 (×21): qty 0.4

## 2023-07-29 MED ORDER — LACTATED RINGERS IV SOLN
INTRAVENOUS | Status: AC
Start: 2023-07-29 — End: 2023-07-30

## 2023-07-29 NOTE — ED Notes (Signed)
 Patient cleaned up after an episode of urinary incontinence. Condom catheter placed on patient. New brief placed on patient and paper pads placed under patient. Bed returned to lowest position with call bell in reach. Patient denies other needs a this time.

## 2023-07-29 NOTE — Progress Notes (Signed)
 OT Cancellation Note  Patient Details Name: Paul Vaughan MRN: [id number] DOB: [birth year]   Cancelled Treatment:    Reason Eval/Treat Not Completed: Other (comment). Upon attempt, pt with staff preparing for transfer out of the ED. Will re-attempt OT evaluation at later date/time as pt is available and appropriate.   Arman Filter., MPH, MS, OTR/L ascom [phone] 07/29/23, 2:43 PM

## 2023-07-29 NOTE — Progress Notes (Signed)
 Progress Note   Patient: Paul Vaughan WUJ:[id number] DOB: 12/27/1955 DOA: 07/28/2023     1 DOS: the patient was seen and examined on 07/29/2023   Brief hospital course: 68 y.o. male with medical history significant of dementia, CKD stage IIIa, COPD, CHF, who presents to the ED due to generalized weakness.   History obtained through chart review due to patient's underlying dementia. Caregiver previously at bedside noted patient has been experiencing increased generalized weakness with poor appetite and episodes of urinary continence with increasing altered mental status over the last few days.  No reported fevers, vomiting, shortness of breath, chest pain, diarrhea.   ED course: On arrival to the ED, patient was hypotensive at 86/53 with heart rate of 90.  He was saturating at 88% on room air and subsequently placed on 3 L.  He was afebrile at 98.6.  Initial workup notable for WBC 12.5, BUN 64, creatinine 2.27, AST 189, ALT 76, GFR of 31.  Urinalysis with no leukocytes, nitrites or bacteria.  Chest x-ray with chronic interstitial lung changes, however increased opacities compared to prior with possible right apical pneumonia.  CT of the abdomen with no acute findings.  Patient started on IV fluids, azithromycin, ceftriaxone, DuoNebs, and Solu-Medrol.  TRH contacted for admission.  3/17.  Continue IV antibiotics for sepsis and pneumonia, taper oxygen if able, IV fluids for rhabdomyolysis.  Assessment and Plan: * Acute hypoxic respiratory failure (HCC) Patient with a pulse ox of 88% on room air.  Likely secondary to sepsis and pneumonia.  Oxygen to be tapered when able to do so.  Severe sepsis (HCC) Present on admission with bilateral pneumonia, hypotension, leukocytosis, tachycardia elevated lactic acid and acute hypoxic respiratory failure and acute kidney injury.  Continue IVF.  Continue IV antibiotics.  Multifocal pneumonia Patient on Maxipime and Zithromax  Acute kidney injury  superimposed on CKD (HCC) AKI on CKD stage IIIa.  Creatinine 2.27 on presentation and down to 1.33.  Elevated LFTs Likely secondary to sepsis  Chronic obstructive pulmonary disease, unspecified (HCC) Continue nebulizers.  On prednisone.  Rhabdomyolysis Hold Crestor.  No falls at home.  PT evaluation.  Continue IV fluids.  Check CK tomorrow.  Chronic diastolic CHF (congestive heart failure) (HCC) Currently no signs of heart failure.  Continue IV fluids.  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  At this time, he is oriented only to self.    Essential (primary) hypertension Hold home antihypertensives given hypotension        Subjective: Patient not feeling so well.  Some cough.  Some shortness of breath.  No muscle weakness or pains.  Lives in a group home.  Does not wear oxygen at home.  Admitted with sepsis pneumonia and acute respiratory failure also found to have rhabdomyolysis.  Physical Exam: Vitals:   07/29/23 0734 07/29/23 0740 07/29/23 1000 07/29/23 1116  BP: (!) 102/58  114/69   Pulse: 85  87   Resp: 15  20   Temp: (!) 97.5 F (36.4 C)   98.2 F (36.8 C)  TempSrc: Oral   Oral  SpO2: 99% 99% 92%   Weight:      Height:       Physical Exam HENT:     Head: Normocephalic.     Mouth/Throat:     Pharynx: No oropharyngeal exudate.  Eyes:     General: Lids are normal.  Conjunctiva/sclera: Conjunctivae normal.  Cardiovascular:     Rate and Rhythm: Normal rate and regular rhythm.     Heart sounds: Normal heart sounds, S1 normal and S2 normal.  Pulmonary:     Breath sounds: Examination of the right-lower field reveals decreased breath sounds and rhonchi. Examination of the left-lower field reveals decreased breath sounds and rhonchi. Decreased breath sounds and rhonchi present. No wheezing or rales.  Abdominal:      Palpations: Abdomen is soft.     Tenderness: There is no abdominal tenderness.  Musculoskeletal:     Right lower leg: No swelling.     Left lower leg: No swelling.  Skin:    General: Skin is warm.     Findings: No rash.  Neurological:     Mental Status: He is alert.     Comments: Able to straight leg raise.     Data Reviewed: Creatinine 1.33, AST 134, ALT 62, white blood count 7.0, hemoglobin 9.8, platelet count 149, procalcitonin 7.48, lactic acid 2.2  Family Communication: Spoke with Phoebe on the phone  Disposition: Status is: Inpatient Remains inpatient appropriate because: Treating for sepsis pneumonia and rhabdomyolysis.  Continue IV antibiotics and IV fluids.  Planned Discharge Destination: Group home    Time spent: 28 minutes  Author: Alford Highland, MD 07/29/2023 11:30 AM  For on call review [email or web address].

## 2023-07-29 NOTE — Hospital Course (Addendum)
 Hospital course / significant events:   HPI: 68 y.o. male with medical history significant of dementia, CKD stage IIIa, COPD, CHF, who presents to the ED due to generalized weakness.    03/16: admitted to hospitalist service , tx pneumonia and rhabdo.  03/18: respiratory status worsening, to stepdown and pulmonology consulted  03/19: respiratory status stabilized, oxygen sats stable on 4 L/min. Stable to transfer to med/tele floor this afternoon.  03/22: weaned to 3L O2, stable awaiting SNF. Pulmonary s/o 04/03: resume abx for pneumonia  03/25 bed search, PASSR pending. Awaiting DSS guardian consent for placement, bed offer accepted 04/01, PASSR still pending but finalized 04/03, now holding thru weekend and hopefully to [facility] 04/07     Consultants:  Pulmonology   Procedures/Surgeries: none      ASSESSMENT & PLAN:   Acute hypoxic respiratory failure d/t pneumonia - resolved Continue on supportive O2 as needed up to 4L/min   Hyponatremia likely secondary to hypovolemia due to poor oral intake - improved Monitor BMP   Severe sepsis d/t pneumonia - sepsis resolved  Multifocal pneumonia Completed repeat round abx   Hypokalemia -improved Monitor BMP   AKI on CKD stage IIIa - resolved  Monitor renal function   Elevated LFTs - improved Likely secondary to sepsis Monitor LFT    Chronic obstructive pulmonary disease, unspecified Continue as needed nebulization Pulmonology consulted, have signed off, follow outpatient    Rhabdomyolysis No falls at home.  PT evaluation.   Resolved with IV fluids. Recheck as needed   Chronic diastolic CHF Currently no signs of heart failure.  Watch closely with gentle fluids.  Echo back in 2024 showed EF greater than 55%. Monitor daily weight Resume jardiance Held ACE d/t AKI, soft BP   Dementia with behavioral disturbance  Per chart review, patient has a history of undifferentiated dementia in addition to borderline intellectual  disability. Continue Depakote, Lexapro, Seroquel   Essential (primary) hypertension Systolic BP's in 161'W to 150's.  BP meds have been on hold. Holding blood pressure medication given relative hypotension       overweight based on BMI: Body mass index is 26.88 kg/m.  Underweight - under 18  overweight - 25 to 29 obese - 30 or more Class 1 obesity: BMI of 30.0 to 34 Class 2 obesity: BMI of 35.0 to 39 Class 3 obesity: BMI of 40.0 to 49 Super Morbid Obesity: BMI 50-59 Super-super Morbid Obesity: BMI 60+ Significantly low or high BMI is associated with higher medical risk.  Weight management advised as adjunct to other disease management and risk reduction treatments    DVT prophylaxis: lovenox  IV fluids: no continuous IV fluids  Nutrition: dysphagia diet Central lines / other devices: none  Code Status: FULL CODE ACP documentation reviewed:  none on file in [facility]  TOC needs: placement Medical barriers to dispo: none. Expected for discharge tomorrow.

## 2023-07-29 NOTE — ED Notes (Signed)
 Diaper soiled with urine and removed. Peri care provided. Clean diaper placed on patient.

## 2023-07-29 NOTE — TOC CM/SW Note (Signed)
 CSW visited patient room twice to see if caregiver was available. No one was there either time.  CSW phoned guardian, Forde Radon, DSS, [phone], twice and lvm for a return call.  CSW isn't sure which group home patient came from.  CSW unable to complete readmit assessment at this time.

## 2023-07-29 NOTE — Progress Notes (Signed)
 PT Cancellation Note  Patient Details Name: Delta Deshmukh MRN: [id number] DOB: 01/10/1956   Cancelled Treatment:    Reason Eval/Treat Not Completed: Other (comment) (Pt is not willing to participate in PT evaluation, makes decision known multiple times. Pt has convinced himself that have a catheter in place is a reason to not get out of bed. Author educated pt on ability to AMB with catherter in place, no success.) Will Attempt again at later date/time.   10:39 AM, 07/29/23 [name], PT, DPT Physical Therapist - [facility]  [phone] (ASCOM)    Therman Hughlett C 07/29/2023, 10:39 AM

## 2023-07-29 NOTE — Assessment & Plan Note (Signed)
 Currently no signs of heart failure.  Watch closely with gentle fluids.  Echo back in 2024 showed EF greater than 55%.

## 2023-07-29 NOTE — Assessment & Plan Note (Signed)
 Hold Crestor.  No falls at home.  PT evaluation.  Continue gentle IV fluids.  CK trended down to 1195.  Check CK tomorrow.

## 2023-07-29 NOTE — Assessment & Plan Note (Signed)
 Switch antibiotics to Unasyn and continue Zithromax.  Repeat chest x-ray looks like aspiration.

## 2023-07-30 ENCOUNTER — Inpatient Hospital Stay

## 2023-07-30 DIAGNOSIS — J189 Pneumonia, unspecified organism: Secondary | ICD-10-CM | POA: Diagnosis not present

## 2023-07-30 DIAGNOSIS — A419 Sepsis, unspecified organism: Secondary | ICD-10-CM | POA: Diagnosis not present

## 2023-07-30 DIAGNOSIS — N179 Acute kidney failure, unspecified: Secondary | ICD-10-CM | POA: Diagnosis not present

## 2023-07-30 DIAGNOSIS — J9601 Acute respiratory failure with hypoxia: Secondary | ICD-10-CM | POA: Diagnosis not present

## 2023-07-30 LAB — CBC
HCT: 29.7 % — ABNORMAL LOW (ref 39.0–52.0)
Hemoglobin: 10.2 g/dL — ABNORMAL LOW (ref 13.0–17.0)
MCH: 32.1 pg (ref 26.0–34.0)
MCHC: 34.3 g/dL (ref 30.0–36.0)
MCV: 93.4 fL (ref 80.0–100.0)
Platelets: 154 10*3/uL (ref 150–400)
RBC: 3.18 MIL/uL — ABNORMAL LOW (ref 4.22–5.81)
RDW: 15.1 % (ref 11.5–15.5)
WBC: 9.7 10*3/uL (ref 4.0–10.5)
nRBC: 0 % (ref 0.0–0.2)

## 2023-07-30 LAB — BLOOD GAS, ARTERIAL
Acid-base deficit: 1.3 mmol/L (ref 0.0–2.0)
Bicarbonate: 24.3 mmol/L (ref 20.0–28.0)
O2 Saturation: 99.3 %
Patient temperature: 37
pCO2 arterial: 43 mmHg (ref 32–48)
pH, Arterial: 7.36 (ref 7.35–7.45)
pO2, Arterial: 111 mmHg — ABNORMAL HIGH (ref 83–108)

## 2023-07-30 LAB — MRSA NEXT GEN BY PCR, NASAL: MRSA by PCR Next Gen: NOT DETECTED

## 2023-07-30 LAB — GLUCOSE, CAPILLARY: Glucose-Capillary: 129 mg/dL — ABNORMAL HIGH (ref 70–99)

## 2023-07-30 LAB — CK: Total CK: 1195 U/L — ABNORMAL HIGH (ref 49–397)

## 2023-07-30 MED ORDER — DIVALPROEX SODIUM ER 250 MG PO TB24
1000.0000 mg | ORAL_TABLET | Freq: Every day | ORAL | Status: DC
  Administered 2023-07-30 – 2023-08-18 (×20): 1000 mg via ORAL
  Filled 2023-07-30: qty 2
  Filled 2023-07-30 (×4): qty 4
  Filled 2023-07-30: qty 2
  Filled 2023-07-30 (×14): qty 4

## 2023-07-30 MED ORDER — METHYLPREDNISOLONE SODIUM SUCC 40 MG IJ SOLR
40.0000 mg | Freq: Every day | INTRAMUSCULAR | Status: DC
  Administered 2023-07-30 – 2023-08-01 (×3): 40 mg via INTRAVENOUS
  Filled 2023-07-30 (×3): qty 1

## 2023-07-30 MED ORDER — FLUTICASONE PROPIONATE 50 MCG/ACT NA SUSP
2.0000 | Freq: Every day | NASAL | Status: DC
  Administered 2023-07-30 – 2023-08-19 (×21): 2 via NASAL
  Filled 2023-07-30 (×2): qty 16

## 2023-07-30 MED ORDER — DIVALPROEX SODIUM ER 250 MG PO TB24
500.0000 mg | ORAL_TABLET | Freq: Every day | ORAL | Status: DC
  Administered 2023-07-31 – 2023-08-19 (×19): 500 mg via ORAL
  Filled 2023-07-30 (×5): qty 2
  Filled 2023-07-30: qty 1
  Filled 2023-07-30 (×13): qty 2

## 2023-07-30 MED ORDER — METRONIDAZOLE 500 MG/100ML IV SOLN
500.0000 mg | Freq: Three times a day (TID) | INTRAVENOUS | Status: DC
  Administered 2023-07-30 – 2023-08-12 (×40): 500 mg via INTRAVENOUS
  Filled 2023-07-30 (×42): qty 100

## 2023-07-30 MED ORDER — SODIUM CHLORIDE 0.9 % IV SOLN
2.0000 g | Freq: Every day | INTRAVENOUS | Status: DC
  Administered 2023-07-30 – 2023-08-05 (×7): 2 g via INTRAVENOUS
  Filled 2023-07-30 (×7): qty 20

## 2023-07-30 MED ORDER — ACETYLCYSTEINE 20 % IN SOLN
2.0000 mL | Freq: Two times a day (BID) | RESPIRATORY_TRACT | Status: DC
  Administered 2023-07-30 – 2023-08-01 (×4): 2 mL via RESPIRATORY_TRACT
  Filled 2023-07-30 (×5): qty 4

## 2023-07-30 NOTE — Evaluation (Signed)
 Occupational Therapy Evaluation Patient Details Name: Paul Vaughan MRN: [id number] DOB: 11-Sep-1955 Today's Date: 07/30/2023   History of Present Illness   Patient is a 68 year old male who presented to ED with weakness and limited ability to ambulate. Patient lives at [facility]. PMH includes dementia, CKD stage IIIa, HTN. He was admitted with COVID and sepsis.   Clinical Impressions Pt seen for OT evaluation. Pt reports at baseline he lives in a group home, no AD for mobility, but endorses several recent falls. Generally pt notes he was indep with basic ADL and staff assist for IADL. Pt requires cues during session to follow simple commands. CGA for bed mobility, MIN A to stand from reg height bed with VC for hand placement and RW mgt. Pt was able to doff his socks but required MOD A to donn new socks from seated position. Pt educated in PLB to support SOB, ADL transfer training with RW. Pt required intermittent VC for PLB to support SpO2 which dropped to 88-89% on 2L with exertion, improving to 91-92% on 2L with rest and VC for PLB. Pt will benefit from skilled OT services to maximize return to PLOF and ability to return to group home.      If plan is discharge home, recommend the following:   A little help with walking and/or transfers;A little help with bathing/dressing/bathroom;Assistance with cooking/housework;Assist for transportation;Direct supervision/assist for medications management;Direct supervision/assist for financial management;Supervision due to cognitive status;Help with stairs or ramp for entrance     Functional Status Assessment   Patient has had a recent decline in their functional status and demonstrates the ability to make significant improvements in function in a reasonable and predictable amount of time.     Equipment Recommendations   Other (comment) (2WW)     Recommendations for Other Services         Precautions/Restrictions    Precautions Precautions: Fall Recall of Precautions/Restrictions: Impaired Restrictions Weight Bearing Restrictions Per Provider Order: No     Mobility Bed Mobility Overal bed mobility: Needs Assistance Bed Mobility: Supine to Sit     Supine to sit: Contact guard, HOB elevated, Used rails     General bed mobility comments: increased time and effort to complete, heavy bed rail and UB use to sit up    Transfers Overall transfer level: Needs assistance Equipment used: Rolling walker (2 wheels) Transfers: Sit to/from Stand Sit to Stand: Min assist           General transfer comment: MAX VC for hand placement but ultimately stands with BUE on RW, requiring MIN A and post/ant weightshift for momentum to prevent posterior LOB      Balance Overall balance assessment: Needs assistance Sitting-balance support: No upper extremity supported, Feet supported Sitting balance-Leahy Scale: Fair     Standing balance support: Bilateral upper extremity supported, Reliant on assistive device for balance, During functional activity Standing balance-Leahy Scale: Poor Standing balance comment: unsteady but no overt LOB, cues for maintaining RW in front of him                           ADL either performed or assessed with clinical judgement   ADL Overall ADL's : Needs assistance/impaired     Grooming: Sitting;Set up;Supervision/safety;Wash/dry face               Lower Body Dressing: Sitting/lateral leans;Moderate assistance Lower Body Dressing Details (indicate cue type and reason): MOD A  for donning socks. Able to doff himself but unable to put them back on Toilet Transfer: Ambulation;BSC/3in1;Rolling walker (2 wheels);Contact guard assist           Functional mobility during ADLs: Contact guard assist;Cueing for safety;Rolling walker (2 wheels);Cueing for sequencing       Vision         Perception         Praxis         Pertinent Vitals/Pain Pain  Assessment Pain Assessment: No/denies pain     Extremity/Trunk Assessment Upper Extremity Assessment Upper Extremity Assessment: Generalized weakness   Lower Extremity Assessment Lower Extremity Assessment: Generalized weakness       Communication Communication Communication: Impaired (cognition)   Cognition Arousal: Alert Behavior During Therapy: Flat affect Cognition: No family/caregiver present to determine baseline             OT - Cognition Comments: Pt alert, oriented to self, and that he is from a group home; decreased safety awareness and problem solving noted                 Following commands: Intact       Cueing  General Comments   Cueing Techniques: Verbal cues      Exercises Other Exercises Other Exercises: Pt educated in PLB to support SOB, ADL transfer training with RW   Shoulder Instructions      Home Living Family/patient expects to be discharged to:: Group home Living Arrangements: Group Home Available Help at Discharge: Other (Comment);Available 24 hours/day (group home staff)                         Home Equipment: None   Additional Comments: Pt reports group home starts with an "H" but unable to share/recall name      Prior Functioning/Environment Prior Level of Function : Needs assist;History of Falls (last six months);Patient poor historian/Family not available             Mobility Comments: Pt denies AD for mobility but does endorse several recent falls ADLs Comments: Pt reports indep with basic ADL, has assist from staff for IADL    OT Problem List: Decreased strength;Cardiopulmonary status limiting activity;Decreased cognition;Decreased activity tolerance;Decreased safety awareness;Impaired balance (sitting and/or standing);Decreased knowledge of use of DME or AE   OT Treatment/Interventions: Self-care/ADL training;Therapeutic exercise;Therapeutic activities;Cognitive remediation/compensation;DME and/or AE  instruction;Patient/family education;Energy conservation;Balance training      OT Goals(Current goals can be found in the care plan section)   Acute Rehab OT Goals Patient Stated Goal: breathe better OT Goal Formulation: With patient Time For Goal Achievement: 08/13/23 Potential to Achieve Goals: Good ADL Goals Pt Will Perform Lower Body Dressing: with modified independence;sitting/lateral leans;sit to/from stand Pt Will Transfer to Toilet: with modified independence;ambulating (LRAD) Pt Will Perform Toileting - Clothing Manipulation and hygiene: with modified independence Additional ADL Goal #1: Pt will utilize learned ECS to support ADL participation and safety, PRN VC from staff, 3/3 opportunities   OT Frequency:  Min 2X/week    Co-evaluation              AM-PAC OT "6 Clicks" Daily Activity     Outcome Measure Help from another person eating meals?: None Help from another person taking care of personal grooming?: A Little Help from another person toileting, which includes using toliet, bedpan, or urinal?: A Little Help from another person bathing (including washing, rinsing, drying)?: A Little Help from another person to put on  and taking off regular upper body clothing?: A Little Help from another person to put on and taking off regular lower body clothing?: A Little 6 Click Score: 19   End of Session Equipment Utilized During Treatment: Gait belt;Oxygen;Rolling walker (2 wheels) Nurse Communication: Mobility status  Activity Tolerance: Patient tolerated treatment well Patient left: in chair;with call bell/phone within reach;with chair alarm set  OT Visit Diagnosis: Other abnormalities of gait and mobility (R26.89);Repeated falls (R29.6);Muscle weakness (generalized) (M62.81)                Time: 5366-4403 OT Time Calculation (min): 29 min Charges:  OT General Charges $OT Visit: 1 Visit OT Evaluation $OT Eval Moderate Complexity: 1 Mod OT  Treatments $Therapeutic Activity: 8-22 mins  Arman Filter., MPH, MS, OTR/L ascom [phone] 07/30/23, 11:41 AM

## 2023-07-30 NOTE — Progress Notes (Signed)
 SLP Cancellation Note  Patient Details Name: Mitchael Luckey MRN: [id number] DOB: January 31, 1956   Cancelled treatment:       Reason Eval/Treat Not Completed: Medical issues which prohibited therapy;Patient not medically ready (chart reviewed; consulted MD/NSG)  Upon arriving at room, pt was being tx'd by RT w/ breathing tx and suctioning d/t declined respiratory status. Multiple discussions w/ NSG re: pt's status today and concern for difficulty swallowing Pills this morning.  W/ pt's current presentation and need for higher level of care, pt made NPO until BSE can be performed. ST services will f/u tomorrow w/ BSE per pt's status. Recommend frequent oral care; aspiration precautions. MD agreed.     Jerilynn Som, MS, CCC-SLP Speech Language Pathologist Rehab Services; [facility] [phone] (ascom) Jovanna Hodges 07/30/2023, 5:37 PM

## 2023-07-30 NOTE — Progress Notes (Signed)
 Progress Note   Patient: Paul Vaughan EAV:[id number] DOB: Jul 03, 1955 DOA: 07/28/2023     2 DOS: the patient was seen and examined on 07/30/2023   Brief hospital course: 68 y.o. male with medical history significant of dementia, CKD stage IIIa, COPD, CHF, who presents to the ED due to generalized weakness.   History obtained through chart review due to patient's underlying dementia. Caregiver previously at bedside noted patient has been experiencing increased generalized weakness with poor appetite and episodes of urinary continence with increasing altered mental status over the last few days.  No reported fevers, vomiting, shortness of breath, chest pain, diarrhea.   ED course: On arrival to the ED, patient was hypotensive at 86/53 with heart rate of 90.  He was saturating at 88% on room air and subsequently placed on 3 L.  He was afebrile at 98.6.  Initial workup notable for WBC 12.5, BUN 64, creatinine 2.27, AST 189, ALT 76, GFR of 31.  Urinalysis with no leukocytes, nitrites or bacteria.  Chest x-ray with chronic interstitial lung changes, however increased opacities compared to prior with possible right apical pneumonia.  CT of the abdomen with no acute findings.  Patient started on IV fluids, azithromycin, ceftriaxone, DuoNebs, and Solu-Medrol.  TRH contacted for admission.  3/17.  Continue IV antibiotics for sepsis and pneumonia, taper oxygen if able, IV fluids for rhabdomyolysis. 3/18.  Patient's respiratory status worsened requiring to be placed on 100% nonrebreather.  Suspect aspiration.  Chest x-ray done but pending results ABG showing a pO2 of 111 and a pH of 7.36 with a pCO2 of 43.  Patient admitted with sepsis and rhabdomyolysis.  CK down to 1195.  Transferred to stepdown unit.  Made NPO.  Changed Maxipime over to Unasyn.  Assessment and Plan: * Acute hypoxic respiratory failure (HCC) Patient with a pulse ox of 88% on room air.  Likely secondary to sepsis and pneumonia.  Today with  worsening oxygenation.  ABG reviewed.  Chest x-ray reviewed looks like aspiration.  Placed on nonrebreather.  Will transfer to the stepdown unit for closer monitoring.  Critical care team consultation.  Severe sepsis (HCC) Present on admission with bilateral pneumonia, hypotension, leukocytosis, tachycardia elevated lactic acid and acute hypoxic respiratory failure and acute kidney injury.  Continue IVF.  Change antibiotics to Unasyn and Zithromax  Multifocal pneumonia Switch antibiotics to Unasyn and continue Zithromax.  Repeat chest x-ray looks like aspiration.  Acute kidney injury superimposed on CKD (HCC) AKI on CKD stage IIIa.  Creatinine 2.27 on presentation and down to 1.33.  Elevated LFTs Likely secondary to sepsis  Chronic obstructive pulmonary disease, unspecified (HCC) Continue nebulizers.  Switch prednisone to Solu-Medrol.  Rhabdomyolysis Hold Crestor.  No falls at home.  PT evaluation.  Continue gentle IV fluids.  CK trended down to 1195.  Check CK tomorrow.  Chronic diastolic CHF (congestive heart failure) (HCC) Currently no signs of heart failure.  Watch closely with gentle fluids.  Echo back in 2024 showed EF greater than 55%.  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  Essential (primary) hypertension Hold home antihypertensives        Subjective: Patient worsened throughout the day.  Patient made NPO.  Patient having some trouble breathing and some coughing and wheezing.  Placed on nonrebreather.  Will transfer to stepdown unit for closer clinical monitoring.  Physical Exam: Vitals:   07/30/23 7829 07/30/23 0354 07/30/23 5621 07/30/23 1131  BP:  126/67 131/65 (!) 165/96  Pulse:  82 80 (!) 102  Resp:  18 20   Temp:  97.8 F (36.6 C)  97.8 F (36.6 C)  TempSrc:  Oral    SpO2: 96% 91% 91% (!) 89%  Weight:      Height:       Physical  Exam HENT:     Head: Normocephalic.     Mouth/Throat:     Pharynx: No oropharyngeal exudate.  Eyes:     General: Lids are normal.     Conjunctiva/sclera: Conjunctivae normal.  Cardiovascular:     Rate and Rhythm: Normal rate and regular rhythm.     Heart sounds: Normal heart sounds, S1 normal and S2 normal.  Pulmonary:     Breath sounds: Transmitted upper airway sounds present. Examination of the right-middle field reveals decreased breath sounds and rhonchi. Examination of the left-middle field reveals decreased breath sounds and rhonchi. Examination of the right-lower field reveals decreased breath sounds and rhonchi. Examination of the left-lower field reveals decreased breath sounds and rhonchi. Decreased breath sounds and rhonchi present. No wheezing or rales.  Abdominal:     Palpations: Abdomen is soft.     Tenderness: There is no abdominal tenderness.  Musculoskeletal:     Right lower leg: No swelling.     Left lower leg: No swelling.  Skin:    General: Skin is warm.     Findings: No rash.  Neurological:     Mental Status: He is lethargic.     Comments: Able to straight leg raise.     Data Reviewed: ABG showing a pO2 of 111, pCO2 of 43 and ABG showing a pH of 7.36, CK 1195, white blood cell count 9.7, hemoglobin 10.2, platelet count 154  Family Communication: Spoke with [name] on the phone  Disposition: Status is: Inpatient Remains inpatient appropriate because: Transfer to stepdown unit consult critical care specialist.  Change antibiotics to Unasyn.  Planned Discharge Destination: Group home    Time spent: 38 minutes Case discussed with critical care team.  Author: Alford Highland, MD 07/30/2023 1:57 PM  For on call review [email or web address].

## 2023-07-30 NOTE — Progress Notes (Signed)
 PT Cancellation Note  Patient Details Name: Paul Vaughan MRN: [id number] DOB: [birth year]   Cancelled Treatment:    Reason Eval/Treat Not Completed: Medical issues which prohibited therapy (Consult received and chart reviewed.  Per notes, patient with decline in respiratory status and transfer to CCU for additional monitoring.  Will hold PT evaluation at this time.  Per guidelines, will require new/continue orders to initiate after transfer to higher level of care; please reconsult as medically appropriate)   Aloura Matsuoka H. Manson Passey, PT, DPT, NCS 07/30/23, 2:03 PM [phone]

## 2023-07-30 NOTE — Plan of Care (Signed)
  Problem: Respiratory: Goal: Ability to maintain adequate ventilation will improve Outcome: Progressing   Problem: Education: Goal: Knowledge of General Education information will improve Description: Including pain rating scale, medication(s)/side effects and non-pharmacologic comfort measures Outcome: Progressing   Problem: Clinical Measurements: Goal: Respiratory complications will improve Outcome: Progressing   Problem: Nutrition: Goal: Adequate nutrition will be maintained Outcome: Progressing   Problem: Elimination: Goal: Will not experience complications related to bowel motility Outcome: Progressing Goal: Will not experience complications related to urinary retention Outcome: Progressing   Problem: Pain Managment: Goal: General experience of comfort will improve and/or be controlled Outcome: Progressing   Problem: Safety: Goal: Ability to remain free from injury will improve Outcome: Progressing   Problem: Skin Integrity: Goal: Risk for impaired skin integrity will decrease Outcome: Progressing

## 2023-07-30 NOTE — Consult Note (Signed)
 CRITICAL CARE     Name: Paul Vaughan MRN: [id number] DOB: [birth year]     LOS: 2   SUBJECTIVE FINDINGS & SIGNIFICANT EVENTS    History of Presenting Illness:  68 yo M with hx of dementia, CKD3a, copd, CHF, who came in with altered mental status.  He was noted to be hypoxemic and bloodwork showed leukocytosis.  He had cxr done with findings of infiltrate of right lung. He is being treated for pneumonia. He was started on rocephin/zithromax for CAP regimen. His CBC showed resolution of leukocytosis. His BMP showes hypoalbuminemia and AKI which is interval improved. Patient with increased O2 req and pCCM consultation was placed for additional evaluation and management.   Lines/tubes : External Urinary Catheter (Active)    Microbiology/Sepsis markers: Results for orders placed or performed during the hospital encounter of 07/28/23  Culture, blood (Routine x 2)     Status: None (Preliminary result)   Collection Time: 07/28/23 10:27 AM   Specimen: BLOOD  Result Value Ref Range Status   Specimen Description BLOOD RIGHT ANTECUBITAL  Final   Special Requests   Final    BOTTLES DRAWN AEROBIC AND ANAEROBIC Blood Culture results may not be optimal due to an inadequate volume of blood received in culture bottles   Culture   Final    NO GROWTH 2 DAYS Performed at [facility], [address], [place], [place] [address]    Report Status PENDING  Incomplete  Culture, blood (Routine x 2)     Status: None (Preliminary result)   Collection Time: 07/28/23 11:00 AM   Specimen: BLOOD  Result Value Ref Range Status   Specimen Description BLOOD BLOOD LEFT FOREARM  Final   Special Requests   Final    BOTTLES DRAWN AEROBIC AND ANAEROBIC Blood Culture results may not be optimal due to an inadequate volume of blood  received in culture bottles   Culture   Final    NO GROWTH 2 DAYS Performed at [facility], [address], [place], [place] [address]    Report Status PENDING  Incomplete  Resp panel by RT-PCR (RSV, Flu A&B, Covid) Anterior Nasal Swab     Status: None   Collection Time: 07/28/23  2:43 PM   Specimen: Anterior Nasal Swab  Result Value Ref Range Status   SARS Coronavirus 2 by RT PCR NEGATIVE NEGATIVE Final    Comment: (NOTE) SARS-CoV-2 target nucleic acids are NOT DETECTED.  The SARS-CoV-2 RNA is generally detectable in upper respiratory specimens during the acute phase of infection. The lowest concentration of SARS-CoV-2 viral copies this assay can detect is 138 copies/mL. A negative result does not preclude SARS-Cov-2 infection and should not be used as the sole basis for treatment or other patient management decisions. A negative result may occur with  improper specimen collection/handling, submission of specimen other than nasopharyngeal swab, presence of viral mutation(s) within the areas targeted by this assay, and inadequate number of viral copies(<138 copies/mL). A negative result must be combined with clinical observations, patient history, and epidemiological information. The expected result is Negative.  Fact Sheet for Patients:  BloggerCourse.com  Fact Sheet for Healthcare Providers:  SeriousBroker.it  This test is no t yet approved or cleared by the [place] FDA and  has been authorized for detection and/or diagnosis of SARS-CoV-2 by FDA under an Emergency Use Authorization (EUA). This EUA will remain  in effect (meaning this test can be used) for the duration of the COVID-19 declaration under Section  564(b)(1) of the Act, 21 U.S.C.section 360bbb-3(b)(1), unless the authorization is terminated  or revoked sooner.       Influenza A by PCR NEGATIVE NEGATIVE Final   Influenza B by PCR NEGATIVE  NEGATIVE Final    Comment: (NOTE) The Xpert Xpress SARS-CoV-2/FLU/RSV plus assay is intended as an aid in the diagnosis of influenza from Nasopharyngeal swab specimens and should not be used as a sole basis for treatment. Nasal washings and aspirates are unacceptable for Xpert Xpress SARS-CoV-2/FLU/RSV testing.  Fact Sheet for Patients: BloggerCourse.com  Fact Sheet for Healthcare Providers: SeriousBroker.it  This test is not yet approved or cleared by the [place] FDA and has been authorized for detection and/or diagnosis of SARS-CoV-2 by FDA under an Emergency Use Authorization (EUA). This EUA will remain in effect (meaning this test can be used) for the duration of the COVID-19 declaration under Section 564(b)(1) of the Act, 21 U.S.C. section 360bbb-3(b)(1), unless the authorization is terminated or revoked.     Resp Syncytial Virus by PCR NEGATIVE NEGATIVE Final    Comment: (NOTE) Fact Sheet for Patients: BloggerCourse.com  Fact Sheet for Healthcare Providers: SeriousBroker.it  This test is not yet approved or cleared by the [place] FDA and has been authorized for detection and/or diagnosis of SARS-CoV-2 by FDA under an Emergency Use Authorization (EUA). This EUA will remain in effect (meaning this test can be used) for the duration of the COVID-19 declaration under Section 564(b)(1) of the Act, 21 U.S.C. section 360bbb-3(b)(1), unless the authorization is terminated or revoked.  Performed at [facility], [address] [place], [place] [address]   MRSA Next Gen by PCR, Nasal     Status: None   Collection Time: 07/30/23  2:24 PM   Specimen: Nasal Mucosa; Nasal Swab  Result Value Ref Range Status   MRSA by PCR Next Gen NOT DETECTED NOT DETECTED Final    Comment: (NOTE) The GeneXpert MRSA Assay (FDA approved for NASAL specimens only), is one  component of a comprehensive MRSA colonization surveillance program. It is not intended to diagnose MRSA infection nor to guide or monitor treatment for MRSA infections. Test performance is not FDA approved in patients less than 2 years old. Performed at [facility], [address] [place], [place] [address]     Anti-infectives:  Anti-infectives (From admission, onward)    Start     Dose/Rate Route Frequency Ordered Stop   07/30/23 1600  metroNIDAZOLE (FLAGYL) IVPB 500 mg        500 mg 100 mL/hr over 60 Minutes Intravenous Every 8 hours 07/30/23 1415     07/30/23 1515  cefTRIAXone (ROCEPHIN) 2 g in sodium chloride 0.9 % 100 mL IVPB        2 g 200 mL/hr over 30 Minutes Intravenous Daily 07/30/23 1415     07/29/23 1200  azithromycin (ZITHROMAX) 500 mg in sodium chloride 0.9 % 250 mL IVPB        500 mg 250 mL/hr over 60 Minutes Intravenous Every 24 hours 07/28/23 1353 08/02/23 1159   07/28/23 1500  ceFEPIme (MAXIPIME) 2 g in sodium chloride 0.9 % 100 mL IVPB  Status:  Discontinued        2 g 200 mL/hr over 30 Minutes Intravenous Every 12 hours 07/28/23 1354 07/30/23 1336   07/28/23 1100  cefTRIAXone (ROCEPHIN) 2 g in sodium chloride 0.9 % 100 mL IVPB        2 g 200 mL/hr over 30 Minutes Intravenous  Once  07/28/23 1057 07/28/23 1210   07/28/23 1100  azithromycin (ZITHROMAX) 500 mg in sodium chloride 0.9 % 250 mL IVPB        500 mg 250 mL/hr over 60 Minutes Intravenous  Once 07/28/23 1057 07/28/23 1255        Consults: Treatment Team:  Pccm, Raymond Gurney, MD Vida Rigger, MD     PAST MEDICAL HISTORY   Past Medical History:  Diagnosis Date   CHF (congestive heart failure) (HCC)    Chronic kidney disease    COPD (chronic obstructive pulmonary disease) (HCC)    Dementia (HCC)      SURGICAL HISTORY   History reviewed. No pertinent surgical history.   FAMILY HISTORY   History reviewed. No pertinent family history.   SOCIAL HISTORY   Social  History   Tobacco Use   Smoking status: Every Day    Current packs/day: 1.00    Types: Cigarettes   Smokeless tobacco: Never  Substance Use Topics   Alcohol use: No   Drug use: No     MEDICATIONS   Current Medication:  Current Facility-Administered Medications:    acetaminophen (TYLENOL) tablet 650 mg, 650 mg, Oral, Q6H PRN **OR** acetaminophen (TYLENOL) suppository 650 mg, 650 mg, Rectal, Q6H PRN, Verdene Lennert, MD   acetylcysteine (MUCOMYST) 20 % nebulizer / oral solution 2 mL, 2 mL, Nebulization, BID, Wieting, Richard, MD, 2 mL at 07/30/23 1301   aspirin EC tablet 81 mg, 81 mg, Oral, Daily, Verdene Lennert, MD, 81 mg at 07/30/23 0812   azithromycin (ZITHROMAX) 500 mg in sodium chloride 0.9 % 250 mL IVPB, 500 mg, Intravenous, Q24H, Verdene Lennert, MD, Last Rate: 250 mL/hr at 07/30/23 1259, 500 mg at 07/30/23 1259   cefTRIAXone (ROCEPHIN) 2 g in sodium chloride 0.9 % 100 mL IVPB, 2 g, Intravenous, Daily, Nazari, Walid A, RPH, Last Rate: 200 mL/hr at 07/30/23 1431, 2 g at 07/30/23 1431   divalproex (DEPAKOTE ER) 24 hr tablet 500 mg, 500 mg, Oral, Daily, 500 mg at 07/30/23 0944 **AND** divalproex (DEPAKOTE ER) 24 hr tablet 1,000 mg, 1,000 mg, Oral, QHS, Hunt, Madison H, RPH, 1,000 mg at 07/29/23 2114   enoxaparin (LOVENOX) injection 40 mg, 40 mg, Subcutaneous, Q24H, Aric, Jost, RPH, 40 mg at 07/29/23 2115   escitalopram (LEXAPRO) tablet 5 mg, 5 mg, Oral, QHS, Verdene Lennert, MD, 5 mg at 07/29/23 2114   fluticasone (FLONASE) 50 MCG/ACT nasal spray 2 spray, 2 spray, Each Nare, Daily, Alford Highland, MD, 2 spray at 07/30/23 0945   folic acid (FOLVITE) tablet 1 mg, 1 mg, Oral, Q1200, Verdene Lennert, MD, 1 mg at 07/30/23 1256   gabapentin (NEURONTIN) capsule 100 mg, 100 mg, Oral, TID, Verdene Lennert, MD, 100 mg at 07/30/23 6295   ipratropium-albuterol (DUONEB) 0.5-2.5 (3) MG/3ML nebulizer solution 3 mL, 3 mL, Nebulization, Q6H, Verdene Lennert, MD, 3 mL at 07/30/23 1301    memantine (NAMENDA) tablet 5 mg, 5 mg, Oral, QHS, Verdene Lennert, MD, 5 mg at 07/29/23 2114   methylPREDNISolone sodium succinate (SOLU-MEDROL) 40 mg/mL injection 40 mg, 40 mg, Intravenous, Daily, Renae Gloss, Richard, MD, 40 mg at 07/30/23 1256   metroNIDAZOLE (FLAGYL) IVPB 500 mg, 500 mg, Intravenous, Q8H, Nazari, Walid A, RPH, Last Rate: 100 mL/hr at 07/30/23 1703, 500 mg at 07/30/23 1703   ondansetron (ZOFRAN) tablet 4 mg, 4 mg, Oral, Q6H PRN **OR** ondansetron (ZOFRAN) injection 4 mg, 4 mg, Intravenous, Q6H PRN, Verdene Lennert, MD   polyethylene glycol (MIRALAX / GLYCOLAX) packet 17 g, 17 g,  Oral, Daily PRN, Verdene Lennert, MD   pyridOXINE (VITAMIN B6) tablet 100 mg, 100 mg, Oral, QHS, Verdene Lennert, MD, 100 mg at 07/29/23 2115   QUEtiapine (SEROQUEL) tablet 150 mg, 150 mg, Oral, QHS, Verdene Lennert, MD, 150 mg at 07/29/23 2113   QUEtiapine (SEROQUEL) tablet 50 mg, 50 mg, Oral, Daily, 50 mg at 07/30/23 0812 **AND** QUEtiapine (SEROQUEL) tablet 50 mg, 50 mg, Oral, Q1400, Hunt, Madison H, RPH, 50 mg at 07/30/23 1315   sodium chloride flush (NS) 0.9 % injection 3 mL, 3 mL, Intravenous, Q12H, Verdene Lennert, MD, 3 mL at 07/30/23 8469   thiamine (VITAMIN B1) tablet 100 mg, 100 mg, Oral, Q1200, Verdene Lennert, MD, 100 mg at 07/30/23 1256    ALLERGIES   Penicillins and Sulfa antibiotics    REVIEW OF SYSTEMS     10 point ROS done and is negative except as per subjective findings  PHYSICAL EXAMINATION   Vital Signs: Temp:  [97.8 F (36.6 C)-98.2 F (36.8 C)] 98.2 F (36.8 C) (03/18 1500) Pulse Rate:  [80-102] 82 (03/18 1700) Resp:  [16-20] 16 (03/18 1700) BP: (112-175)/(65-96) 161/69 (03/18 1700) SpO2:  [89 %-96 %] 94 % (03/18 1700)  GENERAL:Mild distress due to acutely ill process HEAD: Normocephalic, atraumatic.  EYES: Pupils equal, round, reactive to light.  No scleral icterus.  MOUTH: Moist mucosal membrane. NECK: Supple. No thyromegaly. No nodules. No JVD.  PULMONARY:  ronchi mild worse on right CARDIOVASCULAR: S1 and S2. Regular rate and rhythm. No murmurs, rubs, or gallops.  GASTROINTESTINAL: Soft, nontender, non-distended. No masses. Positive bowel sounds. No hepatosplenomegaly.  MUSCULOSKELETAL: No swelling, clubbing, or edema.  NEUROLOGIC: Mild distress due to acute illness SKIN:intact,warm,dry   PERTINENT DATA     Infusions:  azithromycin 500 mg (07/30/23 1259)   cefTRIAXone (ROCEPHIN)  IV 2 g (07/30/23 1431)   metronidazole 500 mg (07/30/23 1703)   Scheduled Medications:  acetylcysteine  2 mL Nebulization BID   aspirin EC  81 mg Oral Daily   divalproex  500 mg Oral Daily   And   divalproex  1,000 mg Oral QHS   enoxaparin (LOVENOX) injection  40 mg Subcutaneous Q24H   escitalopram  5 mg Oral QHS   fluticasone  2 spray Each Nare Daily   folic acid  1 mg Oral Q1200   gabapentin  100 mg Oral TID   ipratropium-albuterol  3 mL Nebulization Q6H   memantine  5 mg Oral QHS   methylPREDNISolone (SOLU-MEDROL) injection  40 mg Intravenous Daily   pyridOXINE  100 mg Oral QHS   QUEtiapine  150 mg Oral QHS   QUEtiapine  50 mg Oral Daily   And   QUEtiapine  50 mg Oral Q1400   sodium chloride flush  3 mL Intravenous Q12H   thiamine  100 mg Oral Q1200   PRN Medications: acetaminophen **OR** acetaminophen, ondansetron **OR** ondansetron (ZOFRAN) IV, polyethylene glycol Hemodynamic parameters:   Intake/Output: 03/17 0701 - 03/18 0700 In: 3 [I.V.:3] Out: -   Ventilator  Settings:     LAB RESULTS:  Basic Metabolic Panel: Recent Labs  Lab 07/28/23 1027 07/29/23 0641  NA 134* 139  K 3.2* 3.8  CL 96* 104  CO2 26 25  GLUCOSE 114* 138*  BUN 64* 49*  CREATININE 2.27* 1.33*  CALCIUM 8.4* 8.0*   Liver Function Tests: Recent Labs  Lab 07/28/23 1027 07/29/23 0641  AST 189* 134*  ALT 76* 62*  ALKPHOS 47 36*  BILITOT 0.7 0.5  PROT 7.5 5.6*  ALBUMIN 3.3* 2.3*   No results for input(s): "LIPASE", "AMYLASE" in the last 168  hours. No results for input(s): "AMMONIA" in the last 168 hours. CBC: Recent Labs  Lab 07/28/23 1027 07/29/23 0641 07/30/23 0555  WBC 12.5* 7.0 9.7  NEUTROABS 8.8* 4.9  --   HGB 13.0 9.8* 10.2*  HCT 37.9* 28.2* 29.7*  MCV 94.8 92.8 93.4  PLT 195 149* 154   Cardiac Enzymes: Recent Labs  Lab 07/28/23 1027 07/30/23 0555  CKTOTAL 3,296* 1,195*   BNP: Invalid input(s): "POCBNP" CBG: Recent Labs  Lab 07/30/23 1413  GLUCAP 129*       IMAGING RESULTS:    Study Result  Narrative & Impression  CLINICAL DATA:  Cough.   EXAM: PORTABLE CHEST 1 VIEW   COMPARISON:  Chest x-ray dated July 28, 2023.   FINDINGS: The patient is rotated to the right. Stable cardiomediastinal silhouette. Patchy opacities throughout the right lung, slightly improved in the right lung apex and slightly worsened at the right lung base. The left lung is clear apart from apical pleuroparenchymal scarring. Possible small right pleural effusion. No pneumothorax. No acute osseous abnormality.   IMPRESSION: 1. Patchy opacities throughout the right lung, slightly improved in the right lung apex and slightly worsened at the right lung base, concerning for multifocal pneumonia.     Electronically Signed   By: Obie Dredge M.D.   On: 07/30/2023 17:00   ASSESSMENT AND PLAN    -Multidisciplinary rounds held today  Acute Hypoxic Respiratory Failure -continue treatment for CAP as current -continue steroids mucomyst  -CRP trend    Renal Failure-CKD 3a -follow chem 7 -follow UO -continue Foley Catheter-assess need daily  ID -continue IV abx as prescibed -follow up cultures  GI/Nutrition GI PROPHYLAXIS as indicated DIET-->TF's as tolerated Constipation protocol as indicated  ENDO - ICU hypoglycemic\Hyperglycemia protocol -check FSBS per protocol   ELECTROLYTES -follow labs as needed -replace as needed -pharmacy consultation   DVT/GI PRX ordered -SCDs  TRANSFUSIONS AS  NEEDED MONITOR FSBS ASSESS the need for LABS as needed    Critical care provider statement:   Total critical care time: 33 minutes   Performed by: Karna Christmas MD   Critical care time was exclusive of separately billable procedures and treating other patients.   Critical care was necessary to treat or prevent imminent or life-threatening deterioration.   Critical care was time spent personally by me on the following activities: development of treatment plan with patient and/or surrogate as well as nursing, discussions with consultants, evaluation of patient's response to treatment, examination of patient, obtaining history from patient or surrogate, ordering and performing treatments and interventions, ordering and review of laboratory studies, ordering and review of radiographic studies, pulse oximetry and re-evaluation of patient's condition.    Vida Rigger, M.D.  Pulmonary & Critical Care Medicine

## 2023-07-31 DIAGNOSIS — J9601 Acute respiratory failure with hypoxia: Secondary | ICD-10-CM | POA: Diagnosis not present

## 2023-07-31 LAB — CBC
HCT: 30.3 % — ABNORMAL LOW (ref 39.0–52.0)
Hemoglobin: 10.5 g/dL — ABNORMAL LOW (ref 13.0–17.0)
MCH: 32.7 pg (ref 26.0–34.0)
MCHC: 34.7 g/dL (ref 30.0–36.0)
MCV: 94.4 fL (ref 80.0–100.0)
Platelets: 163 10*3/uL (ref 150–400)
RBC: 3.21 MIL/uL — ABNORMAL LOW (ref 4.22–5.81)
RDW: 15.3 % (ref 11.5–15.5)
WBC: 9.9 10*3/uL (ref 4.0–10.5)
nRBC: 0 % (ref 0.0–0.2)

## 2023-07-31 LAB — COMPREHENSIVE METABOLIC PANEL
ALT: 53 U/L — ABNORMAL HIGH (ref 0–44)
AST: 66 U/L — ABNORMAL HIGH (ref 15–41)
Albumin: 2.5 g/dL — ABNORMAL LOW (ref 3.5–5.0)
Alkaline Phosphatase: 37 U/L — ABNORMAL LOW (ref 38–126)
Anion gap: 10 (ref 5–15)
BUN: 39 mg/dL — ABNORMAL HIGH (ref 8–23)
CO2: 26 mmol/L (ref 22–32)
Calcium: 8.6 mg/dL — ABNORMAL LOW (ref 8.9–10.3)
Chloride: 102 mmol/L (ref 98–111)
Creatinine, Ser: 1.04 mg/dL (ref 0.61–1.24)
GFR, Estimated: >60 mL/min (ref >60)
Glucose, Bld: 98 mg/dL (ref 70–99)
Potassium: 3.8 mmol/L (ref 3.5–5.1)
Sodium: 138 mmol/L (ref 135–145)
Total Bilirubin: 0.4 mg/dL (ref 0.0–1.2)
Total Protein: 5.9 g/dL — ABNORMAL LOW (ref 6.5–8.1)

## 2023-07-31 LAB — CK: Total CK: 564 U/L — ABNORMAL HIGH (ref 49–397)

## 2023-07-31 LAB — LEGIONELLA PNEUMOPHILA SEROGP 1 UR AG: L. pneumophila Serogp 1 Ur Ag: NEGATIVE

## 2023-07-31 LAB — MAGNESIUM: Magnesium: 2.3 mg/dL (ref 1.7–2.4)

## 2023-07-31 LAB — PHOSPHORUS: Phosphorus: 3.6 mg/dL (ref 2.5–4.6)

## 2023-07-31 MED ORDER — ENSURE ENLIVE PO LIQD
237.0000 mL | Freq: Three times a day (TID) | ORAL | Status: DC
Start: 2023-07-31 — End: 2023-08-19
  Administered 2023-07-31 – 2023-08-19 (×52): 237 mL via ORAL

## 2023-07-31 MED ORDER — CHLORHEXIDINE GLUCONATE CLOTH 2 % EX PADS
6.0000 | MEDICATED_PAD | Freq: Every day | CUTANEOUS | Status: DC
Start: 2023-07-31 — End: 2023-08-03
  Administered 2023-07-31 – 2023-08-02 (×3): 6 via TOPICAL

## 2023-07-31 MED ORDER — ADULT MULTIVITAMIN W/MINERALS CH
1.0000 | ORAL_TABLET | Freq: Every day | ORAL | Status: DC
  Administered 2023-08-01 – 2023-08-19 (×18): 1 via ORAL
  Filled 2023-07-31 (×18): qty 1

## 2023-07-31 NOTE — Evaluation (Addendum)
 Clinical/Bedside Swallow Evaluation Patient Details  Name: Paul Vaughan MRN: [id number] Date of Birth: March 02, 1956  Today's Date: 07/31/2023 Time: SLP Start Time (ACUTE ONLY): 1601 SLP Stop Time (ACUTE ONLY): 1020 SLP Time Calculation (min) (ACUTE ONLY): 55 min  Past Medical History:  Past Medical History:  Diagnosis Date   CHF (congestive heart failure) (HCC)    Chronic kidney disease    COPD (chronic obstructive pulmonary disease) (HCC)    Dementia (HCC)    Past Surgical History: History reviewed. No pertinent surgical history. HPI:  Pt is a 68 y.o. male with medical history significant of Dementia, CKD stage IIIa, COPD, CHF, who presents to the ED due to generalized weakness.     History obtained through chart review due to patient's underlying dementia. Caregiver previously at bedside noted patient has been experiencing increased generalized weakness with poor appetite and episodes of urinary continence with increasing altered mental status over the last few days.  At admit, he was hypotensive at 86/53 with heart rate of 90.  He was saturating at 88% on room air and subsequently placed on 3 L.  Afebrile.  Chest Imaging: Chronic bilateral interstitial thickening and reticular  opacification within the peripheral right-greater-than-left lungs,  greatest within the superolateral right lung. This is in the same  distribution as interstitial thickening and airspace opacity on  06/30/2021 and 12/13/2019, however the opacities are worsened from  prior. This may represent worsening chronic interstitial lung  disease. It is difficult to exclude a superimposed right apical pneumonia.    Assessment / Plan / Recommendation  Clinical Impression   Pt seen for BSE today. Pt was transferred to CCU yesterday d/t decline in respiratory status and some concern for difficulty swallowing Pills w/ water/puree yesterday am.  Today, pt awake, verbally responded to basic questions re: self. He followed  instructions w/ MIN-MOD cue. Min decreased attention during tasks. Pt has dx'd Dementia baseline.  On [place] o2 4L; afebrile. WBC WNL.  Pt appears to present w/ grossly functional oropharyngeal phase swallowing in setting of Edentulous status w/ No neuromuscular deficits noted. Pt consumed po trials w/ No overt, clinical s/s of aspiration during po trials.  Pt appears at reduced risk for aspiration following general aspiration precautions. However, pt does have challenging factors that could impact oropharyngeal swallowing to include acute hospitalization, Cognitive decline/Dementia, deconditioning/weakness, and need for feeding support d/t weakness and Cognitive decline. These factors can increase risk for dysphagia as well as decreased oral intake overall.   During po trials, pt consumed all consistencies w/ no overt coughing, decline in vocal quality, or change in respiratory presentation during/post trials. O2 sats remained at his baseline of 90-92%. Oral phase appeared grossly [facility] w/ timely bolus management, mashing/gumming of softened solids, and control of bolus propulsion for A-P transfer for swallowing. Oral clearing achieved w/ all trial consistencies -- moistened, soft foods given.  OM Exam appeared [facility] w/ no unilateral weakness noted. Speech Clear. Pt attempted to help feed self w/ MOD support.   Recommend a more Mech Soft consistency diet w/ well-Chopped meats, moistened foods(for ease of gumming d/t Edentulous status); Thin liquids -- carefully monitor straw use, and pt should Hold Cup when drinking. Recommend general aspiration precautions, tray setup and sitting up support. Reduce distractions during meals and monitor for any impulsive feeding behaviors. Pills CRUSHED in Puree for safer, easier swallowing -- pt has been doing this today and tolerated well per pt/NSG. This is encouraged now and for D/C.   Education given on  Pills in Puree; food consistencies and easy to eat options; general  aspiration precautions to pt. ST services will monitor toleration of diet - suspect he is at/close to his baseline. MD/NSG updated, agreed. Recommend Dietician f/u for support. Precautions posted in room, chart. SLP Visit Diagnosis: Dysphagia, oral phase (R13.11) (in setting of Edentulous status; Dementia; need for feeding support)    Aspiration Risk  Mild aspiration risk;Risk for inadequate nutrition/hydration (reduced when following general aspiration precautions)    Diet Recommendation   Thin;Dysphagia 3 (mechanical soft) (meats MINCED w/ gravies) = a more Mech Soft consistency diet w/ well-Chopped meats, moistened foods(for ease of gumming d/t Edentulous status); Thin liquids -- carefully monitor straw use, and pt should Hold Cup when drinking. Recommend general aspiration precautions, tray setup and sitting up support. Reduce distractions during meals and monitor for any impulsive feeding behaviors.   Medication Administration: Crushed with puree    Other  Recommendations Recommended Consults:  (Dietician; PalliativeCare) Oral Care Recommendations: Oral care BID;Oral care before and after PO;Staff/trained caregiver to provide oral care    Recommendations for follow up therapy are one component of a multi-disciplinary discharge planning process, led by the attending physician.  Recommendations may be updated based on patient status, additional functional criteria and insurance authorization.  Follow up Recommendations No SLP follow up      Assistance Recommended at Discharge  full  Functional Status Assessment Patient has had a recent decline in their functional status and demonstrates the ability to make significant improvements in function in a reasonable and predictable amount of time.  Frequency and Duration min 1 x/week  1 week       Prognosis Prognosis for improved oropharyngeal function: Good Barriers to Reach Goals: Cognitive deficits;Language deficits;Time post onset;Severity  of deficits Barriers/Prognosis Comment: Edentulous status; Dementia; need for feeding support      Swallow Study   General Date of Onset: 07/28/23 HPI: Pt is a 68 y.o. male with medical history significant of Dementia, CKD stage IIIa, COPD, CHF, who presents to the ED due to generalized weakness.     History obtained through chart review due to patient's underlying dementia. Caregiver previously at bedside noted patient has been experiencing increased generalized weakness with poor appetite and episodes of urinary continence with increasing altered mental status over the last few days.  At admit, he was hypotensive at 86/53 with heart rate of 90.  He was saturating at 88% on room air and subsequently placed on 3 L.  Afebrile.  Chest Imaging: Chronic bilateral interstitial thickening and reticular  opacification within the peripheral right-greater-than-left lungs,  greatest within the superolateral right lung. This is in the same  distribution as interstitial thickening and airspace opacity on  06/30/2021 and 12/13/2019, however the opacities are worsened from  prior. This may represent worsening chronic interstitial lung  disease. It is difficult to exclude a superimposed right apical pneumonia. Type of Study: Bedside Swallow Evaluation Previous Swallow Assessment: 06/2021 - regular, thins Diet Prior to this Study: NPO Temperature Spikes Noted: No (wbc 9.9) Respiratory Status: Nasal cannula (4L) History of Recent Intubation: No Behavior/Cognition: Alert;Cooperative;Pleasant mood;Confused;Distractible;Requires cueing (baseline Dementia) Oral Cavity Assessment: Within Functional Limits Oral Care Completed by SLP: Yes Oral Cavity - Dentition: Edentulous (does not wear dentures per pt) Vision: Functional for self-feeding Self-Feeding Abilities: Needs assist;Needs set up;Total assist (weak UEs) Patient Positioning: Upright in bed (MAX assist) Baseline Vocal Quality: Normal Volitional Cough:  Strong Volitional Swallow: Able to elicit    Oral/Motor/Sensory Function Overall Oral  Motor/Sensory Function: Within functional limits   Ice Chips Ice chips: Within functional limits Presentation: Spoon (fed; 3 trials)   Thin Liquid Thin Liquid: Within functional limits Presentation: Self Fed;Straw (fully supported; ~6 ozs total) Other Comments: water, juice, ensure    Nectar Thick Nectar Thick Liquid: Not tested   Honey Thick Honey Thick Liquid: Not tested   Puree Puree: Within functional limits Presentation: Spoon (fed; ~3 ozs)   Solid     Solid: Impaired Presentation: Spoon (fed; 7 trials) Oral Phase Impairments: Impaired mastication (edentulous) Pharyngeal Phase Impairments:  (none) Other Comments: moistened foods well        Paul Som, MS, CCC-SLP Speech Language Pathologist Rehab Services; [facility] [place] [phone] (ascom) Paul Vaughan 07/31/2023,3:33 PM

## 2023-07-31 NOTE — Progress Notes (Signed)
 CRITICAL CARE     Name: Paul Vaughan MRN: [id number] DOB: 22-Aug-1955     LOS: 3   SUBJECTIVE FINDINGS & SIGNIFICANT EVENTS    History of Presenting Illness:  68 yo M with hx of dementia, CKD3a, copd, CHF, who came in with altered mental status.  He was noted to be hypoxemic and bloodwork showed leukocytosis.  He had cxr done with findings of infiltrate of right lung. He is being treated for pneumonia. He was started on rocephin/zithromax for CAP regimen. His CBC showed resolution of leukocytosis. His BMP showes hypoalbuminemia and AKI which is interval improved. Patient with increased O2 req and pCCM consultation was placed for additional evaluation and management.   07/31/23- patient weaned to 4L/min.  He's doing better clinically.  No changes to therapy.   Lines/tubes : External Urinary Catheter (Active)    Microbiology/Sepsis markers: Results for orders placed or performed during the hospital encounter of 07/28/23  Culture, blood (Routine x 2)     Status: None (Preliminary result)   Collection Time: 07/28/23 10:27 AM   Specimen: BLOOD  Result Value Ref Range Status   Specimen Description BLOOD RIGHT ANTECUBITAL  Final   Special Requests   Final    BOTTLES DRAWN AEROBIC AND ANAEROBIC Blood Culture results may not be optimal due to an inadequate volume of blood received in culture bottles   Culture   Final    NO GROWTH 3 DAYS Performed at [facility], [address], [place], [place] [address]    Report Status PENDING  Incomplete  Culture, blood (Routine x 2)     Status: None (Preliminary result)   Collection Time: 07/28/23 11:00 AM   Specimen: BLOOD  Result Value Ref Range Status   Specimen Description BLOOD BLOOD LEFT FOREARM  Final   Special Requests   Final    BOTTLES DRAWN AEROBIC  AND ANAEROBIC Blood Culture results may not be optimal due to an inadequate volume of blood received in culture bottles   Culture   Final    NO GROWTH 3 DAYS Performed at [facility], [address], [place], [place] [address]    Report Status PENDING  Incomplete  Resp panel by RT-PCR (RSV, Flu A&B, Covid) Anterior Nasal Swab     Status: None   Collection Time: 07/28/23  2:43 PM   Specimen: Anterior Nasal Swab  Result Value Ref Range Status   SARS Coronavirus 2 by RT PCR NEGATIVE NEGATIVE Final    Comment: (NOTE) SARS-CoV-2 target nucleic acids are NOT DETECTED.  The SARS-CoV-2 RNA is generally detectable in upper respiratory specimens during the acute phase of infection. The lowest concentration of SARS-CoV-2 viral copies this assay can detect is 138 copies/mL. A negative result does not preclude SARS-Cov-2 infection and should not be used as the sole basis for treatment or other patient management decisions. A negative result may occur with  improper specimen collection/handling, submission of specimen other than nasopharyngeal swab, presence of viral mutation(s) within the areas targeted by this assay, and inadequate number of viral copies(<138 copies/mL). A negative result must be combined with clinical observations, patient history, and epidemiological information. The expected result is Negative.  Fact Sheet for Patients:  BloggerCourse.com  Fact Sheet for Healthcare Providers:  SeriousBroker.it  This test is no t yet approved or cleared by the [place] FDA and  has been authorized for detection and/or diagnosis of SARS-CoV-2 by FDA under an Emergency Use Authorization (EUA). This EUA will remain  in effect (meaning this test can be used) for the duration of the COVID-19 declaration under Section 564(b)(1) of the Act, 21 U.S.C.section 360bbb-3(b)(1), unless the authorization is terminated  or revoked sooner.        Influenza A by PCR NEGATIVE NEGATIVE Final   Influenza B by PCR NEGATIVE NEGATIVE Final    Comment: (NOTE) The Xpert Xpress SARS-CoV-2/FLU/RSV plus assay is intended as an aid in the diagnosis of influenza from Nasopharyngeal swab specimens and should not be used as a sole basis for treatment. Nasal washings and aspirates are unacceptable for Xpert Xpress SARS-CoV-2/FLU/RSV testing.  Fact Sheet for Patients: BloggerCourse.com  Fact Sheet for Healthcare Providers: SeriousBroker.it  This test is not yet approved or cleared by the [place] FDA and has been authorized for detection and/or diagnosis of SARS-CoV-2 by FDA under an Emergency Use Authorization (EUA). This EUA will remain in effect (meaning this test can be used) for the duration of the COVID-19 declaration under Section 564(b)(1) of the Act, 21 U.S.C. section 360bbb-3(b)(1), unless the authorization is terminated or revoked.     Resp Syncytial Virus by PCR NEGATIVE NEGATIVE Final    Comment: (NOTE) Fact Sheet for Patients: BloggerCourse.com  Fact Sheet for Healthcare Providers: SeriousBroker.it  This test is not yet approved or cleared by the [place] FDA and has been authorized for detection and/or diagnosis of SARS-CoV-2 by FDA under an Emergency Use Authorization (EUA). This EUA will remain in effect (meaning this test can be used) for the duration of the COVID-19 declaration under Section 564(b)(1) of the Act, 21 U.S.C. section 360bbb-3(b)(1), unless the authorization is terminated or revoked.  Performed at [facility], [address] [place], [place] [address]   MRSA Next Gen by PCR, Nasal     Status: None   Collection Time: 07/30/23  2:24 PM   Specimen: Nasal Mucosa; Nasal Swab  Result Value Ref Range Status   MRSA by PCR Next Gen NOT DETECTED NOT DETECTED Final    Comment:  (NOTE) The GeneXpert MRSA Assay (FDA approved for NASAL specimens only), is one component of a comprehensive MRSA colonization surveillance program. It is not intended to diagnose MRSA infection nor to guide or monitor treatment for MRSA infections. Test performance is not FDA approved in patients less than 16 years old. Performed at [facility], [address] [place], [place] [address]     Anti-infectives:  Anti-infectives (From admission, onward)    Start     Dose/Rate Route Frequency Ordered Stop   07/30/23 1600  metroNIDAZOLE (FLAGYL) IVPB 500 mg        500 mg 100 mL/hr over 60 Minutes Intravenous Every 8 hours 07/30/23 1415     07/30/23 1515  cefTRIAXone (ROCEPHIN) 2 g in sodium chloride 0.9 % 100 mL IVPB        2 g 200 mL/hr over 30 Minutes Intravenous Daily 07/30/23 1415     07/29/23 1200  azithromycin (ZITHROMAX) 500 mg in sodium chloride 0.9 % 250 mL IVPB        500 mg 250 mL/hr over 60 Minutes Intravenous Every 24 hours 07/28/23 1353 08/02/23 1159   07/28/23 1500  ceFEPIme (MAXIPIME) 2 g in sodium chloride 0.9 % 100 mL IVPB  Status:  Discontinued        2 g 200 mL/hr over 30 Minutes Intravenous Every 12 hours 07/28/23 1354 07/30/23 1336   07/28/23 1100  cefTRIAXone (ROCEPHIN) 2 g in sodium chloride 0.9 % 100 mL IVPB  2 g 200 mL/hr over 30 Minutes Intravenous  Once 07/28/23 1057 07/28/23 1210   07/28/23 1100  azithromycin (ZITHROMAX) 500 mg in sodium chloride 0.9 % 250 mL IVPB        500 mg 250 mL/hr over 60 Minutes Intravenous  Once 07/28/23 1057 07/28/23 1255        Consults: Treatment Team:  Vida Rigger, MD     PAST MEDICAL HISTORY   Past Medical History:  Diagnosis Date   CHF (congestive heart failure) (HCC)    Chronic kidney disease    COPD (chronic obstructive pulmonary disease) (HCC)    Dementia (HCC)      SURGICAL HISTORY   History reviewed. No pertinent surgical history.   FAMILY HISTORY   History reviewed. No  pertinent family history.   SOCIAL HISTORY   Social History   Tobacco Use   Smoking status: Every Day    Current packs/day: 1.00    Types: Cigarettes   Smokeless tobacco: Never  Substance Use Topics   Alcohol use: No   Drug use: No     MEDICATIONS   Current Medication:  Current Facility-Administered Medications:    acetaminophen (TYLENOL) tablet 650 mg, 650 mg, Oral, Q6H PRN **OR** acetaminophen (TYLENOL) suppository 650 mg, 650 mg, Rectal, Q6H PRN, Verdene Lennert, MD   acetylcysteine (MUCOMYST) 20 % nebulizer / oral solution 2 mL, 2 mL, Nebulization, BID, Wieting, Richard, MD, 2 mL at 07/31/23 0732   aspirin EC tablet 81 mg, 81 mg, Oral, Daily, Verdene Lennert, MD, 81 mg at 07/31/23 0913   azithromycin (ZITHROMAX) 500 mg in sodium chloride 0.9 % 250 mL IVPB, 500 mg, Intravenous, Q24H, Verdene Lennert, MD, Stopped at 07/31/23 1255   cefTRIAXone (ROCEPHIN) 2 g in sodium chloride 0.9 % 100 mL IVPB, 2 g, Intravenous, Daily, Ardis Rowan, Walid A, RPH, Stopped at 07/31/23 1102   Chlorhexidine Gluconate Cloth 2 % PADS 6 each, 6 each, Topical, Daily, Wieting, Richard, MD, 6 each at 07/31/23 1157   divalproex (DEPAKOTE ER) 24 hr tablet 500 mg, 500 mg, Oral, Daily, 500 mg at 07/31/23 0913 **AND** divalproex (DEPAKOTE ER) 24 hr tablet 1,000 mg, 1,000 mg, Oral, QHS, [place], Hubbard Hartshorn, NP, 1,000 mg at 07/30/23 2206   enoxaparin (LOVENOX) injection 40 mg, 40 mg, Subcutaneous, Q24H, Deondrea, Markos, RPH, 40 mg at 07/30/23 2202   escitalopram (LEXAPRO) tablet 5 mg, 5 mg, Oral, QHS, Verdene Lennert, MD, 5 mg at 07/30/23 2158   feeding supplement (ENSURE ENLIVE / ENSURE PLUS) liquid 237 mL, 237 mL, Oral, TID BM, Griffith, Kelly A, DO, 237 mL at 07/31/23 1406   fluticasone (FLONASE) 50 MCG/ACT nasal spray 2 spray, 2 spray, Each Nare, Daily, Wieting, Richard, MD, 2 spray at 07/31/23 1155   folic acid (FOLVITE) tablet 1 mg, 1 mg, Oral, Q1200, Verdene Lennert, MD, 1 mg at 07/31/23 1156    gabapentin (NEURONTIN) capsule 100 mg, 100 mg, Oral, TID, Verdene Lennert, MD, 100 mg at 07/31/23 0913   ipratropium-albuterol (DUONEB) 0.5-2.5 (3) MG/3ML nebulizer solution 3 mL, 3 mL, Nebulization, Q6H, Huel Cote, Iulia, MD, 3 mL at 07/31/23 1414   methylPREDNISolone sodium succinate (SOLU-MEDROL) 40 mg/mL injection 40 mg, 40 mg, Intravenous, Daily, Wieting, Richard, MD, 40 mg at 07/31/23 0912   metroNIDAZOLE (FLAGYL) IVPB 500 mg, 500 mg, Intravenous, Q8H, Nazari, Walid A, RPH, Stopped at 07/31/23 1006   [START ON 08/01/2023] multivitamin with minerals tablet 1 tablet, 1 tablet, Oral, Daily, Esaw Grandchild A, DO   ondansetron (ZOFRAN) tablet 4 mg,  4 mg, Oral, Q6H PRN **OR** ondansetron (ZOFRAN) injection 4 mg, 4 mg, Intravenous, Q6H PRN, Verdene Lennert, MD   polyethylene glycol (MIRALAX / GLYCOLAX) packet 17 g, 17 g, Oral, Daily PRN, Verdene Lennert, MD   pyridOXINE (VITAMIN B6) tablet 100 mg, 100 mg, Oral, QHS, Verdene Lennert, MD, 100 mg at 07/30/23 2200   QUEtiapine (SEROQUEL) tablet 150 mg, 150 mg, Oral, QHS, Verdene Lennert, MD, 150 mg at 07/30/23 2200   QUEtiapine (SEROQUEL) tablet 50 mg, 50 mg, Oral, Daily, 50 mg at 07/31/23 0913 **AND** QUEtiapine (SEROQUEL) tablet 50 mg, 50 mg, Oral, Q1400, Hunt, Madison H, RPH, 50 mg at 07/31/23 1405   sodium chloride flush (NS) 0.9 % injection 3 mL, 3 mL, Intravenous, Q12H, Verdene Lennert, MD, 3 mL at 07/31/23 0913   thiamine (VITAMIN B1) tablet 100 mg, 100 mg, Oral, Q1200, Verdene Lennert, MD, 100 mg at 07/31/23 1156    ALLERGIES   Penicillins and Sulfa antibiotics    REVIEW OF SYSTEMS     10 point ROS done and is negative except as per subjective findings  PHYSICAL EXAMINATION   Vital Signs: Temp:  [97.5 F (36.4 C)-98.6 F (37 C)] 97.7 F (36.5 C) (03/19 1150) Pulse Rate:  [74-105] 95 (03/19 1416) Resp:  [10-23] 17 (03/19 1416) BP: (108-180)/(55-106) 123/106 (03/19 1400) SpO2:  [88 %-99 %] 93 % (03/19 1416)  GENERAL:Mild  distress due to acutely ill process HEAD: Normocephalic, atraumatic.  EYES: Pupils equal, round, reactive to light.  No scleral icterus.  MOUTH: Moist mucosal membrane. NECK: Supple. No thyromegaly. No nodules. No JVD.  PULMONARY: ronchi mild worse on right CARDIOVASCULAR: S1 and S2. Regular rate and rhythm. No murmurs, rubs, or gallops.  GASTROINTESTINAL: Soft, nontender, non-distended. No masses. Positive bowel sounds. No hepatosplenomegaly.  MUSCULOSKELETAL: No swelling, clubbing, or edema.  NEUROLOGIC: Mild distress due to acute illness SKIN:intact,warm,dry   PERTINENT DATA     Infusions:  azithromycin Stopped (07/31/23 1255)   cefTRIAXone (ROCEPHIN)  IV Stopped (07/31/23 1102)   metronidazole Stopped (07/31/23 1006)   Scheduled Medications:  acetylcysteine  2 mL Nebulization BID   aspirin EC  81 mg Oral Daily   Chlorhexidine Gluconate Cloth  6 each Topical Daily   divalproex  500 mg Oral Daily   And   divalproex  1,000 mg Oral QHS   enoxaparin (LOVENOX) injection  40 mg Subcutaneous Q24H   escitalopram  5 mg Oral QHS   feeding supplement  237 mL Oral TID BM   fluticasone  2 spray Each Nare Daily   folic acid  1 mg Oral Q1200   gabapentin  100 mg Oral TID   ipratropium-albuterol  3 mL Nebulization Q6H   methylPREDNISolone (SOLU-MEDROL) injection  40 mg Intravenous Daily   [START ON 08/01/2023] multivitamin with minerals  1 tablet Oral Daily   pyridOXINE  100 mg Oral QHS   QUEtiapine  150 mg Oral QHS   QUEtiapine  50 mg Oral Daily   And   QUEtiapine  50 mg Oral Q1400   sodium chloride flush  3 mL Intravenous Q12H   thiamine  100 mg Oral Q1200   PRN Medications: acetaminophen **OR** acetaminophen, ondansetron **OR** ondansetron (ZOFRAN) IV, polyethylene glycol Hemodynamic parameters:   Intake/Output: 03/18 0701 - 03/19 0700 In: 697.1 [IV Piggyback:697.1] Out: 1000 [Urine:1000]  Ventilator  Settings:     LAB RESULTS:  Basic Metabolic Panel: Recent Labs   Lab 07/28/23 1027 07/29/23 0641 07/31/23 0517  NA 134* 139 138  K 3.2*  3.8 3.8  CL 96* 104 102  CO2 26 25 26   GLUCOSE 114* 138* 98  BUN 64* 49* 39*  CREATININE 2.27* 1.33* 1.04  CALCIUM 8.4* 8.0* 8.6*  MG  --   --  2.3  PHOS  --   --  3.6   Liver Function Tests: Recent Labs  Lab 07/28/23 1027 07/29/23 0641 07/31/23 0517  AST 189* 134* 66*  ALT 76* 62* 53*  ALKPHOS 47 36* 37*  BILITOT 0.7 0.5 0.4  PROT 7.5 5.6* 5.9*  ALBUMIN 3.3* 2.3* 2.5*   No results for input(s): "LIPASE", "AMYLASE" in the last 168 hours. No results for input(s): "AMMONIA" in the last 168 hours. CBC: Recent Labs  Lab 07/28/23 1027 07/29/23 0641 07/30/23 0555 07/31/23 0517  WBC 12.5* 7.0 9.7 9.9  NEUTROABS 8.8* 4.9  --   --   HGB 13.0 9.8* 10.2* 10.5*  HCT 37.9* 28.2* 29.7* 30.3*  MCV 94.8 92.8 93.4 94.4  PLT 195 149* 154 163   Cardiac Enzymes: Recent Labs  Lab 07/28/23 1027 07/30/23 0555 07/31/23 0517  CKTOTAL 3,296* 1,195* 564*   BNP: Invalid input(s): "POCBNP" CBG: Recent Labs  Lab 07/30/23 1413  GLUCAP 129*       IMAGING RESULTS:    Study Result  Narrative & Impression  CLINICAL DATA:  Cough.   EXAM: PORTABLE CHEST 1 VIEW   COMPARISON:  Chest x-ray dated July 28, 2023.   FINDINGS: The patient is rotated to the right. Stable cardiomediastinal silhouette. Patchy opacities throughout the right lung, slightly improved in the right lung apex and slightly worsened at the right lung base. The left lung is clear apart from apical pleuroparenchymal scarring. Possible small right pleural effusion. No pneumothorax. No acute osseous abnormality.   IMPRESSION: 1. Patchy opacities throughout the right lung, slightly improved in the right lung apex and slightly worsened at the right lung base, concerning for multifocal pneumonia.     Electronically Signed   By: Obie Dredge M.D.   On: 07/30/2023 17:00   ASSESSMENT AND PLAN    -Multidisciplinary rounds held  today  Acute Hypoxic Respiratory Failure -continue treatment for CAP as current -continue steroids mucomyst  -CRP trend    Renal Failure-CKD 3a -follow chem 7 -follow UO -continue Foley Catheter-assess need daily  ID -continue IV abx as prescibed -follow up cultures  GI/Nutrition GI PROPHYLAXIS as indicated DIET-->TF's as tolerated Constipation protocol as indicated  ENDO - ICU hypoglycemic\Hyperglycemia protocol -check FSBS per protocol   ELECTROLYTES -follow labs as needed -replace as needed -pharmacy consultation   DVT/GI PRX ordered -SCDs  TRANSFUSIONS AS NEEDED MONITOR FSBS ASSESS the need for LABS as needed    Critical care provider statement:   Total critical care time: 33 minutes   Performed by: Karna Christmas MD   Critical care time was exclusive of separately billable procedures and treating other patients.   Critical care was necessary to treat or prevent imminent or life-threatening deterioration.   Critical care was time spent personally by me on the following activities: development of treatment plan with patient and/or surrogate as well as nursing, discussions with consultants, evaluation of patient's response to treatment, examination of patient, obtaining history from patient or surrogate, ordering and performing treatments and interventions, ordering and review of laboratory studies, ordering and review of radiographic studies, pulse oximetry and re-evaluation of patient's condition.    Vida Rigger, M.D.  Pulmonary & Critical Care Medicine

## 2023-07-31 NOTE — Plan of Care (Signed)

## 2023-07-31 NOTE — Progress Notes (Signed)
 Progress Note   Patient: Paul Vaughan XBM:[id number] DOB: [birth year] DOA: 07/28/2023     3 DOS: the patient was seen and examined on 07/31/2023   Brief hospital course: 68 y.o. male with medical history significant of dementia, CKD stage IIIa, COPD, CHF, who presents to the ED due to generalized weakness.   History obtained through chart review due to patient's underlying dementia. Caregiver previously at bedside noted patient has been experiencing increased generalized weakness with poor appetite and episodes of urinary continence with increasing altered mental status over the last few days.  No reported fevers, vomiting, shortness of breath, chest pain, diarrhea.   ED course: On arrival to the ED, patient was hypotensive at 86/53 with heart rate of 90.  He was saturating at 88% on room air and subsequently placed on 3 L.  He was afebrile at 98.6.  Initial workup notable for WBC 12.5, BUN 64, creatinine 2.27, AST 189, ALT 76, GFR of 31.  Urinalysis with no leukocytes, nitrites or bacteria.  Chest x-ray with chronic interstitial lung changes, however increased opacities compared to prior with possible right apical pneumonia.  CT of the abdomen with no acute findings.  Patient started on IV fluids, azithromycin, ceftriaxone, DuoNebs, and Solu-Medrol.  TRH contacted for admission.  3/17.  Continue IV antibiotics for sepsis and pneumonia, taper oxygen if able, IV fluids for rhabdomyolysis. 3/18.  Patient's respiratory status worsened requiring to be placed on 100% nonrebreather.  Suspect aspiration.  Chest x-ray done but pending results ABG showing a pO2 of 111 and a pH of 7.36 with a pCO2 of 43.  Patient admitted with sepsis and rhabdomyolysis.  CK down to 1195.  Transferred to stepdown unit.  Made NPO.  Changed Maxipime over to Unasyn.  I assumed care on 07/31/23.  3/19 -- respiratory status stabilized, oxygen sats stable on 4 L/min.  Stable to transfer to med/tele floor this  afternoon.   Assessment and Plan:  * Acute hypoxic respiratory failure (HCC) Patient with a pulse ox of 88% on room air on admission, due to sepsis from pneumonia.  3/18 developed acute worsening hypoxia after aspiration episode. Transferred to stepdown and pulmonology was consulted. 3/19 -- stable on 4 L/min [place] O2 --Supplement O2 per protocol --Target sats > 90% --Appreciate Pulmonology's input --Steroids, mucomyst --Mgmt of PNA as outlined below.  Severe sepsis  Present on admission with bilateral pneumonia, hypotension, leukocytosis, tachycardia elevated lactic acid and acute hypoxic respiratory failure and acute kidney injury.  Continue IVF.  Change antibiotics to Unasyn and Zithromax  Multifocal pneumonia --Continue Unasyn and continue Zithromax    Acute kidney injury superimposed on CKD (HCC) AKI on CKD stage IIIa.   Creatinine 2.27 on presentation.  Normalized to baseline. --Monitor BMP  Elevated LFTs Likely secondary to sepsis --Trend LFT's  Chronic obstructive pulmonary disease, unspecified (HCC) --Continue nebulizers and Solu-Medrol --Pulmonology following  Rhabdomyolysis Hold Crestor.  No falls at home.  PT evaluation.  Continue gentle IV fluids.  CK trended down to 1195.  Check CK tomorrow.  Chronic diastolic CHF (congestive heart failure) (HCC) Currently no signs of heart failure.  Watch closely with gentle fluids.  Echo back in 2024 showed EF greater than 55%.  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  Essential (primary) hypertension Hold home antihypertensives       Subjective: Patient seen in stepdown this AM. He denies any issues or complaints.  Just seen  by SLP for swallow evaluation and did well.  Needs meds in applesauce. Pt pleasantly confused, not able to give history.    Physical Exam: Vitals:   07/31/23 1150 07/31/23  1300 07/31/23 1400 07/31/23 1416  BP:  110/71 (!) 123/106   Pulse: 99 93 99 95  Resp: 15 15 15 17   Temp: 97.7 F (36.5 C)     TempSrc: Oral     SpO2: 91% 91% 91% 93%  Weight:      Height:       General exam: awake, alert, no acute distress HEENT: atraumatic, clear conjunctiva, anicteric sclera, oist mucus membranes, hearing grossly normal  Respiratory system: CTAB generally diminished, no wheezes, normal respiratory effort at rest on 4 L/min [place] O2. Cardiovascular system: normal S1/S2, RRR, no pedal edema.   Gastrointestinal system: soft, NT, ND, no HSM felt, +bowel sounds. Central nervous system: A&O x 4. no gross focal neurologic deficits, normal speech Extremities: moves all , no edema, normal tone Skin: dry, intact, normal temperature Psychiatry: normal mood, congruent affect, judgement and insight appear normal     Data Reviewed:  Notable labs -- BUN 39, Ca 8.6, alk phos 37, albumin 2.5, AST 66, ALT 53, Hbg 10.5 stable  Family Communication: None at bedside on rounds, will attempt to call this afternoon as time allows.  Disposition: Status is: Inpatient Remains inpatient appropriate because: remains on IV therapies pending further improvement and weaning down oxygen   Planned Discharge Destination: Group home    Time spent: 38 minutes Case discussed with critical care team.  Author: Pennie Banter, DO 07/31/2023 4:40 PM  For on call review [email or web address].

## 2023-07-31 NOTE — TOC Initial Note (Signed)
 Transition of Care [facility]) - Initial/Assessment Note    Patient Details  Name: Paul Vaughan MRN: [id number] Date of Birth: Oct 01, 1955  Transition of Care [facility]) CM/SW Contact:    Garret Reddish, RN Phone Number: 07/31/2023, 3:25 PM  Clinical Narrative:                 Chart reviewed.  Noted that patient was admitted with Acute Hypoxic Respiratory failure.    I have spoken with Blythe Stanford from [facility]  [name]'s contact number is [phone].  Eldridge Dace reports that prior to admission patient was able to get around the Group Home with out any assistive devices.  She reports that patient was on 02 @ 3L per [place].  Eldridge Dace has O2 via [employer].  Eldridge Dace reports that if patient needs Home Health on discharge they will allow Home Health to come on site to their facility.  Eldridge Dace reports on discharge Group Home can pick patient up.  She reports that she will need an Fl 2 and Discharge Summary.        Expected Discharge Plan:  (TBD) Barriers to Discharge: No Barriers Identified   Patient Goals and CMS Choice            Expected Discharge Plan and Services   Discharge Planning Services: CM Consult   Living arrangements for the past 2 months: Group Home (Patient from [facility].)                                      Prior Living Arrangements/Services Living arrangements for the past 2 months: Group Home ([facility] from [facility].)   Patient language and need for interpreter reviewed:: Yes                 Activities of Daily Living   ADL Screening (condition at time of admission) Independently performs ADLs?: Yes (appropriate for developmental age) Is the patient deaf or have difficulty hearing?: No Does the patient have difficulty seeing, even when wearing glasses/contacts?: No Does the patient have difficulty concentrating, remembering, or making decisions?: Yes  Permission Sought/Granted                   Emotional Assessment              Admission diagnosis:  Acute respiratory failure with hypoxia (HCC) [J96.01] Generalized weakness [R53.1] AKI (acute kidney injury) (HCC) [N17.9] Sepsis (HCC) [A41.9] Community acquired pneumonia of right upper lobe of lung [J18.9] Patient Active Problem List   Diagnosis Date Noted   Multifocal pneumonia 07/29/2023   Chronic diastolic CHF (congestive heart failure) (HCC) 07/29/2023   Rhabdomyolysis 07/29/2023   Acute hypoxic respiratory failure (HCC) 07/28/2023   Elevated LFTs 07/28/2023   Thrombocytopenia (HCC) 07/01/2021   Anemia of chronic disease 07/01/2021   Acute kidney injury superimposed on CKD (HCC) 07/01/2021   Severe sepsis (HCC) 06/30/2021   Acute hypoxemic respiratory failure due to COVID-19 (HCC) 06/30/2021   Chronic depression 06/08/2021   Chronic obstructive pulmonary disease, unspecified (HCC) 06/08/2021   Essential (primary) hypertension 06/08/2021   Dementia with behavioral disturbance (HCC) 06/08/2021   Proteinuria, unspecified 06/08/2021   Hyperlipidemia, unspecified 06/08/2021   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 [place] [address] Phone: [phone] Fax: [phone]  [facility] [place], [place] - [address] [place] [address] Phone: [phone] Fax: [phone]     Social Drivers of Health (SDOH) Social History: SDOH Screenings   Food Insecurity: No Food Insecurity (07/29/2023)  Housing: Low Risk  (07/29/2023)  Transportation Needs: No Transportation Needs (07/29/2023)  Utilities: Not At Risk (07/29/2023)  Social Connections: Unknown (07/29/2023)  Tobacco Use: High Risk (07/29/2023)   SDOH Interventions:     Readmission Risk  Interventions    07/01/2021    1:02 PM  Readmission Risk Prevention Plan  Transportation Screening Complete  PCP or Specialist Appt within 5-7 Days Complete  Home Care Screening Complete  Medication Review (RN CM) Complete

## 2023-08-01 DIAGNOSIS — J9601 Acute respiratory failure with hypoxia: Secondary | ICD-10-CM | POA: Diagnosis not present

## 2023-08-01 LAB — COMPREHENSIVE METABOLIC PANEL
ALT: 42 U/L (ref 0–44)
AST: 45 U/L — ABNORMAL HIGH (ref 15–41)
Albumin: 2.1 g/dL — ABNORMAL LOW (ref 3.5–5.0)
Alkaline Phosphatase: 43 U/L (ref 38–126)
Anion gap: 8 (ref 5–15)
BUN: 37 mg/dL — ABNORMAL HIGH (ref 8–23)
CO2: 27 mmol/L (ref 22–32)
Calcium: 8 mg/dL — ABNORMAL LOW (ref 8.9–10.3)
Chloride: 103 mmol/L (ref 98–111)
Creatinine, Ser: 0.9 mg/dL (ref 0.61–1.24)
GFR, Estimated: >60 mL/min (ref >60)
Glucose, Bld: 80 mg/dL (ref 70–99)
Potassium: 2.9 mmol/L — ABNORMAL LOW (ref 3.5–5.1)
Sodium: 138 mmol/L (ref 135–145)
Total Bilirubin: 0.4 mg/dL (ref 0.0–1.2)
Total Protein: 4.8 g/dL — ABNORMAL LOW (ref 6.5–8.1)

## 2023-08-01 LAB — CBC
HCT: 27.5 % — ABNORMAL LOW (ref 39.0–52.0)
Hemoglobin: 9.6 g/dL — ABNORMAL LOW (ref 13.0–17.0)
MCH: 32.4 pg (ref 26.0–34.0)
MCHC: 34.9 g/dL (ref 30.0–36.0)
MCV: 92.9 fL (ref 80.0–100.0)
Platelets: 172 10*3/uL (ref 150–400)
RBC: 2.96 MIL/uL — ABNORMAL LOW (ref 4.22–5.81)
RDW: 15.1 % (ref 11.5–15.5)
WBC: 11.3 10*3/uL — ABNORMAL HIGH (ref 4.0–10.5)
nRBC: 0.4 % — ABNORMAL HIGH (ref 0.0–0.2)

## 2023-08-01 LAB — MAGNESIUM: Magnesium: 2 mg/dL (ref 1.7–2.4)

## 2023-08-01 MED ORDER — ALBUTEROL SULFATE (2.5 MG/3ML) 0.083% IN NEBU
2.5000 mg | INHALATION_SOLUTION | RESPIRATORY_TRACT | Status: DC | PRN
  Administered 2023-08-15: 2.5 mg via RESPIRATORY_TRACT
  Filled 2023-08-01: qty 3

## 2023-08-01 MED ORDER — DEXAMETHASONE SODIUM PHOSPHATE 4 MG/ML IJ SOLN
4.0000 mg | Freq: Once | INTRAMUSCULAR | Status: AC
  Administered 2023-08-06: 4 mg via INTRAVENOUS
  Filled 2023-08-01: qty 1

## 2023-08-01 MED ORDER — DEXAMETHASONE SODIUM PHOSPHATE 10 MG/ML IJ SOLN
6.0000 mg | Freq: Once | INTRAMUSCULAR | Status: AC
  Administered 2023-08-04: 6 mg via INTRAVENOUS
  Filled 2023-08-01: qty 0.6

## 2023-08-01 MED ORDER — POTASSIUM CHLORIDE CRYS ER 20 MEQ PO TBCR
40.0000 meq | EXTENDED_RELEASE_TABLET | ORAL | Status: AC
  Administered 2023-08-01 (×2): 40 meq via ORAL
  Filled 2023-08-01 (×2): qty 2

## 2023-08-01 MED ORDER — IPRATROPIUM-ALBUTEROL 0.5-2.5 (3) MG/3ML IN SOLN
3.0000 mL | Freq: Three times a day (TID) | RESPIRATORY_TRACT | Status: DC
  Administered 2023-08-01 – 2023-08-08 (×19): 3 mL via RESPIRATORY_TRACT
  Filled 2023-08-01 (×20): qty 3

## 2023-08-01 MED ORDER — DEXAMETHASONE SODIUM PHOSPHATE 4 MG/ML IJ SOLN
2.0000 mg | Freq: Once | INTRAMUSCULAR | Status: AC
  Administered 2023-08-08: 2 mg via INTRAVENOUS
  Filled 2023-08-01: qty 0.5

## 2023-08-01 MED ORDER — DEXAMETHASONE SODIUM PHOSPHATE 4 MG/ML IJ SOLN
1.0000 mg | Freq: Once | INTRAMUSCULAR | Status: AC
  Administered 2023-08-09: 1 mg via INTRAVENOUS
  Filled 2023-08-01: qty 0.25

## 2023-08-01 MED ORDER — DEXAMETHASONE SODIUM PHOSPHATE 10 MG/ML IJ SOLN
5.0000 mg | Freq: Once | INTRAMUSCULAR | Status: AC
  Administered 2023-08-05: 5 mg via INTRAVENOUS
  Filled 2023-08-01: qty 0.5

## 2023-08-01 MED ORDER — DEXAMETHASONE SODIUM PHOSPHATE 10 MG/ML IJ SOLN
7.0000 mg | Freq: Once | INTRAMUSCULAR | Status: AC
  Administered 2023-08-03: 7 mg via INTRAVENOUS
  Filled 2023-08-01: qty 0.7

## 2023-08-01 MED ORDER — DEXAMETHASONE SODIUM PHOSPHATE 10 MG/ML IJ SOLN
8.0000 mg | Freq: Once | INTRAMUSCULAR | Status: AC
  Administered 2023-08-02: 8 mg via INTRAVENOUS
  Filled 2023-08-01: qty 0.8

## 2023-08-01 MED ORDER — DEXAMETHASONE SODIUM PHOSPHATE 4 MG/ML IJ SOLN
3.0000 mg | Freq: Once | INTRAMUSCULAR | Status: AC
  Administered 2023-08-07: 3 mg via INTRAVENOUS
  Filled 2023-08-01: qty 0.75

## 2023-08-01 MED ORDER — DEXAMETHASONE SODIUM PHOSPHATE 10 MG/ML IJ SOLN: 8.0000 mg | INTRAMUSCULAR | Status: DC

## 2023-08-01 MED ORDER — ALBUMIN HUMAN 25 % IV SOLN
12.5000 g | Freq: Every day | INTRAVENOUS | Status: AC
  Administered 2023-08-01 – 2023-08-03 (×3): 12.5 g via INTRAVENOUS
  Filled 2023-08-01 (×3): qty 50

## 2023-08-01 NOTE — Progress Notes (Signed)
 Speech Language Pathology Treatment: Dysphagia  Patient Details Name: Paul Vaughan MRN: [id number] DOB: May 31, 1955 Today's Date: 08/01/2023 Time: 1350-1430 SLP Time Calculation (min) (ACUTE ONLY): 40 min  Assessment / Plan / Recommendation Clinical Impression  Pt seen for ongoing assessment of swallowing and toleration of diet today. A mech soft diet w/ thin liquids was initiated yesterday post pt being transferred to CCU the day prior d/t decline in respiratory status and some concern for difficulty swallowing Pills w/ water/puree.  Today, pt awake, verbally responded to basic questions re: self. He followed instructions w/ MIN-MOD cue. He asked for a "Coke". Pt has dx'd Dementia baseline.  On [place] o2 4L; afebrile.  Pt explained general aspiration precautions and agreed verbally to the need for following them especially taking Small bites/sips and sitting upright for all oral intake -- supported behind the back for full upright sitting.    Pt appears to present w/ grossly functional oropharyngeal phase swallowing in setting of Edentulous status and Cognitive decline/Dementia w/ No neuromuscular deficits noted. Pt consumed po trials w/ No overt, clinical s/s of aspiration during po trials.  Pt appears at reduced risk for aspiration following general aspiration precautions. However, pt does have challenging factors that could impact oropharyngeal swallowing to include acute hospitalization, Cognitive decline/Dementia, deconditioning/weakness, and need for feeding support d/t weakness and Cognitive decline. These factors can increase risk for dysphagia as well as decreased oral intake overall.    During po trials, pt consumed all consistencies w/ no overt coughing, decline in vocal quality, or change in respiratory presentation during/post trials. Oral phase appeared grossly [facility] w/ timely bolus management, mashing/gumming of softened solids, and control of bolus propulsion for A-P transfer for  swallowing. Oral clearing achieved w/ all trial consistencies given min Time-- moistened, soft foods given.     Recommend continue a more Mech Soft consistency diet w/ well-Chopped meats, moistened foods(for ease of gumming d/t Edentulous status); Thin liquids -- carefully monitor straw use, and pt should Hold Cup when drinking. Recommend general aspiration precautions, tray setup and sitting up support. Reduce distractions during meals and monitor for any impulsive feeding behaviors. Pills WHOLE vs CRUSHED in Puree for safer, easier swallowing -- pt has been doing this today and tolerated well per pt/NSG. This is encouraged now and for D/C.    Education given on Pills in Puree; food consistencies and easy to eat options; general aspiration precautions to pt and NSG. No further acute ST services indicated; MD to reconsult if new needs arise- suspect he is at/close to his swallowing Baseline. MD/NSG updated, agreed. Recommend Dietician f/u for support. Precautions posted in room, chart.     HPI HPI: Pt is a 68 y.o. male with medical history significant of Dementia, CKD stage IIIa, COPD, CHF, who presents to the ED due to generalized weakness.     History obtained through chart review due to patient's underlying dementia. Caregiver previously at bedside noted patient has been experiencing increased generalized weakness with poor appetite and episodes of urinary continence with increasing altered mental status over the last few days.  At admit, he was hypotensive at 86/53 with heart rate of 90.  He was saturating at 88% on room air and subsequently placed on 3 L.  Afebrile.  Chest Imaging: Chronic bilateral interstitial thickening and reticular  opacification within the peripheral right-greater-than-left lungs,  greatest within the superolateral right lung. This is in the same  distribution as interstitial thickening and airspace opacity on  06/30/2021 and 12/13/2019, however the  opacities are worsened from   prior. This may represent worsening chronic interstitial lung  disease. It is difficult to exclude a superimposed right apical pneumonia.      SLP Plan  All goals met      Recommendations for follow up therapy are one component of a multi-disciplinary discharge planning process, led by the attending physician.  Recommendations may be updated based on patient status, additional functional criteria and insurance authorization.    Recommendations  Diet recommendations: Dysphagia 3 (mechanical soft);Thin liquid (gravies) Liquids provided via: Cup;Straw Medication Administration: Whole meds with puree (vs Crushed in puree if needed) Supervision: Staff to assist with self feeding;Full supervision/cueing for compensatory strategies Compensations: Minimize environmental distractions;Slow rate;Small sips/bites;Lingual sweep for clearance of pocketing;Follow solids with liquid Postural Changes and/or Swallow Maneuvers: Out of bed for meals;Seated upright 90 degrees;Upright 30-60 min after meal (Reflux precs)                 (Dietician) Oral care BID;Oral care before and after PO;Staff/trained caregiver to provide oral care   Frequent or constant Supervision/Assistance Dysphagia, oral phase (R13.11) (in setting of Edentulous status; Dementia; need for feeding support)     All goals met       Jerilynn Som, MS, CCC-SLP Speech Language Pathologist Rehab Services; [facility] [phone] (ascom) Tyrell Seifer  08/01/2023, 3:54 PM

## 2023-08-01 NOTE — Evaluation (Signed)
 Physical Therapy Evaluation Patient Details Name: Paul Vaughan MRN: [id number] DOB: 29-Apr-1956 Today's Date: 08/01/2023  History of Present Illness  Patient is a 68 year old male who presented to ED with weakness and limited ability to ambulate. Patient lives at [facility]. PMH includes dementia, CKD stage IIIa, HTN. He was admitted with COVID and sepsis.  Clinical Impression  Patient received in bed, he has limited verbalizations. He is agreeable to sitting up on side of bed stating "I don't think I'm ready for that" when I mentioned getting up to recliner. Patient requires min A with supine to sit, mod A sit to supine to bring LEs up on bed. Patient is able to sit up unsupported at edge of bed with supervision but limited tolerance due to reported dizziness. Patient O2 sats on 3 liters was 85%, bumped oxygen up to 4 liters, RN notified. He will continue to benefit from skilled PT to improve strength, activity tolerance and safety with mobility.          If plan is discharge home, recommend the following: A lot of help with walking and/or transfers;A lot of help with bathing/dressing/bathroom   Can travel by private vehicle   No    Equipment Recommendations Other (comment) (TBD)  Recommendations for Other Services       Functional Status Assessment Patient has had a recent decline in their functional status and demonstrates the ability to make significant improvements in function in a reasonable and predictable amount of time.     Precautions / Restrictions Precautions Precautions: Fall Recall of Precautions/Restrictions: Impaired Restrictions Weight Bearing Restrictions Per Provider Order: No      Mobility  Bed Mobility Overal bed mobility: Needs Assistance Bed Mobility: Supine to Sit, Sit to Supine     Supine to sit: Mod assist, Min assist Sit to supine: Min assist, Mod assist   General bed mobility comments: increased time and effort to complete,  heavy bed rail and UB use to sit up. Reports dizziness with sittin up on edge of bed/ Returned self to supine after a couple of minutes of sitting up.    Transfers                   General transfer comment: declined    Ambulation/Gait               General Gait Details: unable  Stairs            Wheelchair Mobility     Tilt Bed    Modified Rankin (Stroke Patients Only)       Balance Overall balance assessment: Needs assistance Sitting-balance support: Feet supported, Single extremity supported Sitting balance-Leahy Scale: Fair                                       Pertinent Vitals/Pain Pain Assessment Pain Assessment: No/denies pain    Home Living Family/patient expects to be discharged to:: Skilled nursing facility Living Arrangements: Group Home Available Help at Discharge: Other (Comment);Available 24 hours/day             Home Equipment: None      Prior Function Prior Level of Function : Needs assist;History of Falls (last six months);Patient poor historian/Family not available             Mobility Comments: Pt denies AD for mobility but does endorse several recent falls  ADLs Comments: Pt reports indep with basic ADL, has assist from staff for IADL     Extremity/Trunk Assessment   Upper Extremity Assessment Upper Extremity Assessment: Defer to OT evaluation    Lower Extremity Assessment Lower Extremity Assessment: Generalized weakness    Cervical / Trunk Assessment Cervical / Trunk Assessment: Normal  Communication   Communication Communication: Impaired Factors Affecting Communication: Reduced clarity of speech    Cognition Arousal: Alert Behavior During Therapy: Flat affect   PT - Cognitive impairments: History of cognitive impairments                         Following commands: Intact       Cueing Cueing Techniques: Verbal cues, Gestural cues     General Comments       Exercises     Assessment/Plan    PT Assessment Patient needs continued PT services  PT Problem List Decreased strength;Decreased activity tolerance;Decreased balance;Decreased mobility;Cardiopulmonary status limiting activity;Decreased knowledge of use of DME;Decreased safety awareness;Decreased knowledge of precautions;Decreased cognition       PT Treatment Interventions DME instruction;Gait training;Balance training;Functional mobility training;Therapeutic activities;Therapeutic exercise;Patient/family education;Cognitive remediation;Neuromuscular re-education    PT Goals (Current goals can be found in the Care Plan section)  Acute Rehab PT Goals Patient Stated Goal: none stated PT Goal Formulation: With patient Time For Goal Achievement: 08/12/23    Frequency Min 2X/week     Co-evaluation               AM-PAC PT "6 Clicks" Mobility  Outcome Measure Help needed turning from your back to your side while in a flat bed without using bedrails?: A Lot Help needed moving from lying on your back to sitting on the side of a flat bed without using bedrails?: A Lot Help needed moving to and from a bed to a chair (including a wheelchair)?: Total Help needed standing up from a chair using your arms (e.g., wheelchair or bedside chair)?: Total Help needed to walk in hospital room?: Total Help needed climbing 3-5 steps with a railing? : Total 6 Click Score: 8    End of Session Equipment Utilized During Treatment: Oxygen Activity Tolerance: Patient limited by fatigue;Other (comment) (dizziness) Patient left: in bed;with call bell/phone within reach;with bed alarm set Nurse Communication: Mobility status;Other (comment) (Low O2 sats) PT Visit Diagnosis: Other abnormalities of gait and mobility (R26.89);Muscle weakness (generalized) (M62.81)    Time: 1610-9604 PT Time Calculation (min) (ACUTE ONLY): 9 min   Charges:   PT Evaluation $PT Re-evaluation: 1 Re-eval   PT General  Charges $$ ACUTE PT VISIT: 1 Visit         Khloe Hunkele, PT, GCS 08/01/23,11:55 AM

## 2023-08-01 NOTE — Progress Notes (Signed)
 OT Cancellation Note  Patient Details Name: Paul Vaughan MRN: [id number] DOB: 23-Jul-1955   Cancelled Treatment:    Reason Eval/Treat Not Completed: Other (comment). Chart reviewed. Per notes, patient with decline in respiratory status and transfer to CCU for additional monitoring on 3/18.  Will hold OT at this time.  Per guidelines, will require new/continue orders to initiate after transfer to higher level of care; please reconsult as medically appropriate. MD notified.   Arman Filter., MPH, MS, OTR/L ascom [phone] 08/01/23, 3:29 PM

## 2023-08-01 NOTE — Plan of Care (Signed)
  Problem: Clinical Measurements: Goal: Diagnostic test results will improve Outcome: Progressing   Problem: Respiratory: Goal: Ability to maintain adequate ventilation will improve Outcome: Progressing   Problem: Activity: Goal: Risk for activity intolerance will decrease Outcome: Progressing   Problem: Elimination: Goal: Will not experience complications related to bowel motility Outcome: Progressing

## 2023-08-01 NOTE — Progress Notes (Addendum)
 Progress Note   Patient: Paul Vaughan ZOX:[id number] DOB: October 28, 1955 DOA: 07/28/2023     4 DOS: the patient was seen and examined on 08/01/2023   Brief hospital course: 68 y.o. male with medical history significant of dementia, CKD stage IIIa, COPD, CHF, who presents to the ED due to generalized weakness.   History obtained through chart review due to patient's underlying dementia. Caregiver previously at bedside noted patient has been experiencing increased generalized weakness with poor appetite and episodes of urinary continence with increasing altered mental status over the last few days.  No reported fevers, vomiting, shortness of breath, chest pain, diarrhea.   ED course: On arrival to the ED, patient was hypotensive at 86/53 with heart rate of 90.  He was saturating at 88% on room air and subsequently placed on 3 L.  He was afebrile at 98.6.  Initial workup notable for WBC 12.5, BUN 64, creatinine 2.27, AST 189, ALT 76, GFR of 31.  Urinalysis with no leukocytes, nitrites or bacteria.  Chest x-ray with chronic interstitial lung changes, however increased opacities compared to prior with possible right apical pneumonia.  CT of the abdomen with no acute findings.  Patient started on IV fluids, azithromycin, ceftriaxone, DuoNebs, and Solu-Medrol.  TRH contacted for admission.  3/17.  Continue IV antibiotics for sepsis and pneumonia, taper oxygen if able, IV fluids for rhabdomyolysis. 3/18.  Patient's respiratory status worsened requiring to be placed on 100% nonrebreather.  Suspect aspiration.  Chest x-ray done but pending results ABG showing a pO2 of 111 and a pH of 7.36 with a pCO2 of 43.  Patient admitted with sepsis and rhabdomyolysis.  CK down to 1195.  Transferred to stepdown unit.  Made NPO.  Changed Maxipime over to Unasyn.  I assumed care on 07/31/23.  3/19 -- respiratory status stabilized, oxygen sats stable on 4 L/min.  Stable to transfer to med/tele floor this afternoon.  3/20  -- on 4 L/min [place] O2, sats overall low 90's.   Assessment and Plan:  * Acute hypoxic respiratory failure (HCC) Patient with a pulse ox of 88% on room air on admission, due to sepsis from pneumonia.  3/18 developed acute worsening hypoxia after aspiration episode. Transferred to stepdown and pulmonology was consulted. 3/19--20: stable on 4 L/min [place] O2 --Supplement O2 per protocol --Target sats > 90% --Appreciate Pulmonology's input --Steroids, mucomyst --Mgmt of PNA as outlined below.  Severe sepsis  Present on admission with bilateral pneumonia, hypotension, leukocytosis, tachycardia elevated lactic acid and acute hypoxic respiratory failure and acute kidney injury.  Continue IVF.  Change antibiotics to Unasyn and Zithromax  Multifocal pneumonia --Continue Rocephin (day 4), Zithromax (day 5), Flagyl (day 3)  Hypokalemia - K 2.9 this AM (3/20). Mg is 2.0. --Oral K-Cl 40 mEq x 2 doses to replace --BMP in AM  Acute kidney injury superimposed on CKD (HCC) AKI on CKD stage IIIa.   Creatinine 2.27 on presentation.  Normalized to baseline. --Monitor BMP  Elevated LFTs Likely secondary to sepsis --Trend LFT's  Chronic obstructive pulmonary disease, unspecified (HCC) --Continue nebulizers and Solu-Medrol --Pulmonology following  Rhabdomyolysis Hold Crestor.  No falls at home.  PT evaluation.  Continue gentle IV fluids.  CK trended down to 1195.  Check CK tomorrow.  Chronic diastolic CHF (congestive heart failure) (HCC) Currently no signs of heart failure.  Watch closely with gentle fluids.  Echo back in 2024 showed EF greater than 55%.  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  Essential (primary) hypertension Hold home antihypertensives       Subjective: Patient awake resting in bed having breakfast, NT at bedside.  Pt denies trouble breathing, cough,  fever chills chest pain or other complaints. No acute events reported.  NT notes earlier this AM, nasal cannula was out of one nostril.   Physical Exam: Vitals:   08/01/23 0816 08/01/23 0943 08/01/23 1038 08/01/23 1038  BP:  (!) 141/70    Pulse:  98 95 94  Resp:  16    Temp:  98.2 F (36.8 C)    TempSrc:      SpO2: 90% (!) 86% 91% 92%  Weight:      Height:       General exam: awake, alert, no acute distress HEENT: moist mucus membranes, hearing grossly normal  Respiratory system: CTAB generally diminished, no wheezes, normal respiratory effort at rest on 4 L/min [place] O2. Cardiovascular system: normal S1/S2, RRR, no pedal edema.   Gastrointestinal system: soft, NT, ND, no HSM felt, +bowel sounds. Central nervous system: A&O x 4. no gross focal neurologic deficits, normal speech Extremities: moves all , no edema, normal tone Psychiatry: normal mood, flat affect     Data Reviewed:  Notable labs -- BUN 37, Ca 8.0, albumin 2.1, AST 66>>45, ALT 53>>42 normalized, Hbg 10.5 >> 9.6 overall stable.  Family Communication: None at bedside on rounds, will attempt to call this afternoon as time allows.  Disposition: Status is: Inpatient Remains inpatient appropriate because: remains on IV therapies pending further improvement and weaning down oxygen   Planned Discharge Destination: return to prior group home    Time spent: 36 minutes   Author: Pennie Banter, DO 08/01/2023 11:00 AM  For on call review [email or web address].

## 2023-08-01 NOTE — Care Management Important Message (Signed)
 Important Message  Patient Details  Name: Paul Vaughan MRN: [id number] Date of Birth: 02-Jul-1955   Important Message Given:  Yes - Medicare IM     Cristela Blue, CMA 08/01/2023, 12:03 PM

## 2023-08-01 NOTE — Progress Notes (Signed)
 PULMONOLOGY AND CRITICAL CARE    Name: Paul Vaughan MRN: [id number] DOB: November 09, 1955     LOS: 4   SUBJECTIVE FINDINGS & SIGNIFICANT EVENTS    History of Presenting Illness:  68 yo M with hx of dementia, CKD3a, copd, CHF, who came in with altered mental status.  He was noted to be hypoxemic and bloodwork showed leukocytosis.  He had cxr done with findings of infiltrate of right lung. He is being treated for pneumonia. He was started on rocephin/zithromax for CAP regimen. His CBC showed resolution of leukocytosis. His BMP showes hypoalbuminemia and AKI which is interval improved. Patient with increased O2 req and pCCM consultation was placed for additional evaluation and management.   08/01/23- Patient is curled up in bed during my evaluation today. He is has moderate to severe protein calorie malnutrition and is developing interstitial edema, will add daily albumin. He was noted to have desaturation with PT/OT today.   Lines/tubes : External Urinary Catheter (Active)    Microbiology/Sepsis markers: Results for orders placed or performed during the hospital encounter of 07/28/23  Culture, blood (Routine x 2)     Status: None (Preliminary result)   Collection Time: 07/28/23 10:27 AM   Specimen: BLOOD  Result Value Ref Range Status   Specimen Description BLOOD RIGHT ANTECUBITAL  Final   Special Requests   Final    BOTTLES DRAWN AEROBIC AND ANAEROBIC Blood Culture results may not be optimal due to an inadequate volume of blood received in culture bottles   Culture   Final    NO GROWTH 4 DAYS Performed at [facility], [address] [place], [place] [address]    Report Status PENDING  Incomplete  Culture, blood (Routine x 2)     Status: None (Preliminary result)   Collection Time: 07/28/23 11:00 AM    Specimen: BLOOD  Result Value Ref Range Status   Specimen Description BLOOD BLOOD LEFT FOREARM  Final   Special Requests   Final    BOTTLES DRAWN AEROBIC AND ANAEROBIC Blood Culture results may not be optimal due to an inadequate volume of blood received in culture bottles   Culture   Final    NO GROWTH 4 DAYS Performed at [facility], [address], [place], [place] [address]    Report Status PENDING  Incomplete  Resp panel by RT-PCR (RSV, Flu A&B, Covid) Anterior Nasal Swab     Status: None   Collection Time: 07/28/23  2:43 PM   Specimen: Anterior Nasal Swab  Result Value Ref Range Status   SARS Coronavirus 2 by RT PCR NEGATIVE NEGATIVE Final    Comment: (NOTE) SARS-CoV-2 target nucleic acids are NOT DETECTED.  The SARS-CoV-2 RNA is generally detectable in upper respiratory specimens during the acute phase of infection. The lowest concentration of SARS-CoV-2 viral copies this assay can detect is 138 copies/mL. A negative result does not preclude SARS-Cov-2 infection and should not be used as the sole basis for treatment or other patient management decisions. A negative result may occur with  improper specimen collection/handling, submission of specimen other than nasopharyngeal swab, presence of viral mutation(s) within the areas targeted by this assay, and inadequate number of viral copies(<138 copies/mL). A negative result must be combined with clinical observations, patient history, and epidemiological information. The expected result is Negative.  Fact Sheet for Patients:  BloggerCourse.com  Fact Sheet for Healthcare Providers:  SeriousBroker.it  This test is no t yet approved or cleared by the [place] FDA  and  has been authorized for detection and/or diagnosis of SARS-CoV-2 by FDA under an Emergency Use Authorization (EUA). This EUA will remain  in effect (meaning this test can be used) for the  duration of the COVID-19 declaration under Section 564(b)(1) of the Act, 21 U.S.C.section 360bbb-3(b)(1), unless the authorization is terminated  or revoked sooner.       Influenza A by PCR NEGATIVE NEGATIVE Final   Influenza B by PCR NEGATIVE NEGATIVE Final    Comment: (NOTE) The Xpert Xpress SARS-CoV-2/FLU/RSV plus assay is intended as an aid in the diagnosis of influenza from Nasopharyngeal swab specimens and should not be used as a sole basis for treatment. Nasal washings and aspirates are unacceptable for Xpert Xpress SARS-CoV-2/FLU/RSV testing.  Fact Sheet for Patients: BloggerCourse.com  Fact Sheet for Healthcare Providers: SeriousBroker.it  This test is not yet approved or cleared by the [place] FDA and has been authorized for detection and/or diagnosis of SARS-CoV-2 by FDA under an Emergency Use Authorization (EUA). This EUA will remain in effect (meaning this test can be used) for the duration of the COVID-19 declaration under Section 564(b)(1) of the Act, 21 U.S.C. section 360bbb-3(b)(1), unless the authorization is terminated or revoked.     Resp Syncytial Virus by PCR NEGATIVE NEGATIVE Final    Comment: (NOTE) Fact Sheet for Patients: BloggerCourse.com  Fact Sheet for Healthcare Providers: SeriousBroker.it  This test is not yet approved or cleared by the [place] FDA and has been authorized for detection and/or diagnosis of SARS-CoV-2 by FDA under an Emergency Use Authorization (EUA). This EUA will remain in effect (meaning this test can be used) for the duration of the COVID-19 declaration under Section 564(b)(1) of the Act, 21 U.S.C. section 360bbb-3(b)(1), unless the authorization is terminated or revoked.  Performed at [facility], [address] [place], [place] [address]   MRSA Next Gen by PCR, Nasal     Status: None    Collection Time: 07/30/23  2:24 PM   Specimen: Nasal Mucosa; Nasal Swab  Result Value Ref Range Status   MRSA by PCR Next Gen NOT DETECTED NOT DETECTED Final    Comment: (NOTE) The GeneXpert MRSA Assay (FDA approved for NASAL specimens only), is one component of a comprehensive MRSA colonization surveillance program. It is not intended to diagnose MRSA infection nor to guide or monitor treatment for MRSA infections. Test performance is not FDA approved in patients less than 44 years old. Performed at [facility], [address] [place], [place] [address]     Anti-infectives:  Anti-infectives (From admission, onward)    Start     Dose/Rate Route Frequency Ordered Stop   07/30/23 1600  metroNIDAZOLE (FLAGYL) IVPB 500 mg        500 mg 100 mL/hr over 60 Minutes Intravenous Every 8 hours 07/30/23 1415     07/30/23 1515  cefTRIAXone (ROCEPHIN) 2 g in sodium chloride 0.9 % 100 mL IVPB        2 g 200 mL/hr over 30 Minutes Intravenous Daily 07/30/23 1415     07/29/23 1200  azithromycin (ZITHROMAX) 500 mg in sodium chloride 0.9 % 250 mL IVPB        500 mg 250 mL/hr over 60 Minutes Intravenous Every 24 hours 07/28/23 1353 08/02/23 1159   07/28/23 1500  ceFEPIme (MAXIPIME) 2 g in sodium chloride 0.9 % 100 mL IVPB  Status:  Discontinued        2 g 200 mL/hr over 30 Minutes Intravenous  Every 12 hours 07/28/23 1354 07/30/23 1336   07/28/23 1100  cefTRIAXone (ROCEPHIN) 2 g in sodium chloride 0.9 % 100 mL IVPB        2 g 200 mL/hr over 30 Minutes Intravenous  Once 07/28/23 1057 07/28/23 1210   07/28/23 1100  azithromycin (ZITHROMAX) 500 mg in sodium chloride 0.9 % 250 mL IVPB        500 mg 250 mL/hr over 60 Minutes Intravenous  Once 07/28/23 1057 07/28/23 1255        Consults: Treatment Team:  Vida Rigger, MD     PAST MEDICAL HISTORY   Past Medical History:  Diagnosis Date   CHF (congestive heart failure) (HCC)    Chronic kidney disease    COPD (chronic  obstructive pulmonary disease) (HCC)    Dementia (HCC)      SURGICAL HISTORY   History reviewed. No pertinent surgical history.   FAMILY HISTORY   History reviewed. No pertinent family history.   SOCIAL HISTORY   Social History   Tobacco Use   Smoking status: Every Day    Current packs/day: 1.00    Types: Cigarettes   Smokeless tobacco: Never  Substance Use Topics   Alcohol use: No   Drug use: No     MEDICATIONS   Current Medication:  Current Facility-Administered Medications:    acetaminophen (TYLENOL) tablet 650 mg, 650 mg, Oral, Q6H PRN **OR** acetaminophen (TYLENOL) suppository 650 mg, 650 mg, Rectal, Q6H PRN, Verdene Lennert, MD   acetylcysteine (MUCOMYST) 20 % nebulizer / oral solution 2 mL, 2 mL, Nebulization, BID, Wieting, Richard, MD, 2 mL at 08/01/23 0802   aspirin EC tablet 81 mg, 81 mg, Oral, Daily, Verdene Lennert, MD, 81 mg at 07/31/23 0913   azithromycin (ZITHROMAX) 500 mg in sodium chloride 0.9 % 250 mL IVPB, 500 mg, Intravenous, Q24H, Verdene Lennert, MD, Stopped at 07/31/23 1255   cefTRIAXone (ROCEPHIN) 2 g in sodium chloride 0.9 % 100 mL IVPB, 2 g, Intravenous, Daily, Ardis Rowan, Walid A, RPH, Stopped at 07/31/23 1102   Chlorhexidine Gluconate Cloth 2 % PADS 6 each, 6 each, Topical, Daily, Wieting, Richard, MD, 6 each at 07/31/23 1157   divalproex (DEPAKOTE ER) 24 hr tablet 500 mg, 500 mg, Oral, Daily, 500 mg at 07/31/23 0913 **AND** divalproex (DEPAKOTE ER) 24 hr tablet 1,000 mg, 1,000 mg, Oral, QHS, [place], Hubbard Hartshorn, NP, 1,000 mg at 07/31/23 2206   enoxaparin (LOVENOX) injection 40 mg, 40 mg, Subcutaneous, Q24H, Mateo, Overbeck, RPH, 40 mg at 07/31/23 2207   escitalopram (LEXAPRO) tablet 5 mg, 5 mg, Oral, QHS, Verdene Lennert, MD, 5 mg at 07/31/23 2207   feeding supplement (ENSURE ENLIVE / ENSURE PLUS) liquid 237 mL, 237 mL, Oral, TID BM, Griffith, Kelly A, DO, 237 mL at 07/31/23 1406   fluticasone (FLONASE) 50 MCG/ACT nasal spray 2 spray, 2  spray, Each Nare, Daily, Wieting, Richard, MD, 2 spray at 07/31/23 1155   folic acid (FOLVITE) tablet 1 mg, 1 mg, Oral, Q1200, Verdene Lennert, MD, 1 mg at 07/31/23 1156   gabapentin (NEURONTIN) capsule 100 mg, 100 mg, Oral, TID, Verdene Lennert, MD, 100 mg at 07/31/23 2207   ipratropium-albuterol (DUONEB) 0.5-2.5 (3) MG/3ML nebulizer solution 3 mL, 3 mL, Nebulization, Q6H, Huel Cote, Iulia, MD, 3 mL at 08/01/23 0802   methylPREDNISolone sodium succinate (SOLU-MEDROL) 40 mg/mL injection 40 mg, 40 mg, Intravenous, Daily, Wieting, Richard, MD, 40 mg at 07/31/23 0912   metroNIDAZOLE (FLAGYL) IVPB 500 mg, 500 mg, Intravenous, Q8H, Nazari, Walid A,  RPH, Last Rate: 100 mL/hr at 08/01/23 0145, 500 mg at 08/01/23 0145   multivitamin with minerals tablet 1 tablet, 1 tablet, Oral, Daily, Esaw Grandchild A, DO   ondansetron (ZOFRAN) tablet 4 mg, 4 mg, Oral, Q6H PRN **OR** ondansetron (ZOFRAN) injection 4 mg, 4 mg, Intravenous, Q6H PRN, Verdene Lennert, MD   polyethylene glycol (MIRALAX / GLYCOLAX) packet 17 g, 17 g, Oral, Daily PRN, Verdene Lennert, MD   pyridOXINE (VITAMIN B6) tablet 100 mg, 100 mg, Oral, QHS, Verdene Lennert, MD, 100 mg at 07/30/23 2200   QUEtiapine (SEROQUEL) tablet 150 mg, 150 mg, Oral, QHS, Verdene Lennert, MD, 150 mg at 07/31/23 2206   QUEtiapine (SEROQUEL) tablet 50 mg, 50 mg, Oral, Daily, 50 mg at 07/31/23 0913 **AND** QUEtiapine (SEROQUEL) tablet 50 mg, 50 mg, Oral, Q1400, Hunt, Madison H, RPH, 50 mg at 07/31/23 1405   sodium chloride flush (NS) 0.9 % injection 3 mL, 3 mL, Intravenous, Q12H, Verdene Lennert, MD, 3 mL at 07/31/23 2208   thiamine (VITAMIN B1) tablet 100 mg, 100 mg, Oral, Q1200, Verdene Lennert, MD, 100 mg at 07/31/23 1156    ALLERGIES   Penicillins and Sulfa antibiotics    REVIEW OF SYSTEMS     10 point ROS done and is negative except as per subjective findings  PHYSICAL EXAMINATION   Vital Signs: Temp:  [97.7 F (36.5 C)-98.2 F (36.8 C)] 98.2 F (36.8  C) (03/20 0538) Pulse Rate:  [86-105] 86 (03/20 0538) Resp:  [10-23] 16 (03/20 0538) BP: (107-134)/(60-106) 107/60 (03/20 0538) SpO2:  [88 %-93 %] 90 % (03/20 0816) FiO2 (%):  [0.4 %] 0.4 % (03/20 0816) Weight:  [83.4 kg] 83.4 kg (03/20 0500)  GENERAL:Mild distress due to acutely ill process HEAD: Normocephalic, atraumatic.  EYES: Pupils equal, round, reactive to light.  No scleral icterus.  MOUTH: Moist mucosal membrane. NECK: Supple. No thyromegaly. No nodules. No JVD.  PULMONARY: ronchi mild worse on right CARDIOVASCULAR: S1 and S2. Regular rate and rhythm. No murmurs, rubs, or gallops.  GASTROINTESTINAL: Soft, nontender, non-distended. No masses. Positive bowel sounds. No hepatosplenomegaly.  MUSCULOSKELETAL: No swelling, clubbing, or edema.  NEUROLOGIC: Mild distress due to acute illness SKIN:intact,warm,dry   PERTINENT DATA     Infusions:  azithromycin Stopped (07/31/23 1255)   cefTRIAXone (ROCEPHIN)  IV Stopped (07/31/23 1102)   metronidazole 500 mg (08/01/23 0145)   Scheduled Medications:  acetylcysteine  2 mL Nebulization BID   aspirin EC  81 mg Oral Daily   Chlorhexidine Gluconate Cloth  6 each Topical Daily   divalproex  500 mg Oral Daily   And   divalproex  1,000 mg Oral QHS   enoxaparin (LOVENOX) injection  40 mg Subcutaneous Q24H   escitalopram  5 mg Oral QHS   feeding supplement  237 mL Oral TID BM   fluticasone  2 spray Each Nare Daily   folic acid  1 mg Oral Q1200   gabapentin  100 mg Oral TID   ipratropium-albuterol  3 mL Nebulization Q6H   methylPREDNISolone (SOLU-MEDROL) injection  40 mg Intravenous Daily   multivitamin with minerals  1 tablet Oral Daily   pyridOXINE  100 mg Oral QHS   QUEtiapine  150 mg Oral QHS   QUEtiapine  50 mg Oral Daily   And   QUEtiapine  50 mg Oral Q1400   sodium chloride flush  3 mL Intravenous Q12H   thiamine  100 mg Oral Q1200   PRN Medications: acetaminophen **OR** acetaminophen, ondansetron **OR** ondansetron  (ZOFRAN) IV,  polyethylene glycol Hemodynamic parameters:   Intake/Output: 03/19 0701 - 03/20 0700 In: 729.9 [P.O.:180; IV Piggyback:549.9] Out: 800 [Urine:800]  Ventilator  Settings: FiO2 (%):  [0.4 %] 0.4 %   LAB RESULTS:  Basic Metabolic Panel: Recent Labs  Lab 07/28/23 1027 07/29/23 0641 07/31/23 0517 08/01/23 0517  NA 134* 139 138 138  K 3.2* 3.8 3.8 2.9*  CL 96* 104 102 103  CO2 26 25 26 27   GLUCOSE 114* 138* 98 80  BUN 64* 49* 39* 37*  CREATININE 2.27* 1.33* 1.04 0.90  CALCIUM 8.4* 8.0* 8.6* 8.0*  MG  --   --  2.3 2.0  PHOS  --   --  3.6  --    Liver Function Tests: Recent Labs  Lab 07/28/23 1027 07/29/23 0641 07/31/23 0517 08/01/23 0517  AST 189* 134* 66* 45*  ALT 76* 62* 53* 42  ALKPHOS 47 36* 37* 43  BILITOT 0.7 0.5 0.4 0.4  PROT 7.5 5.6* 5.9* 4.8*  ALBUMIN 3.3* 2.3* 2.5* 2.1*   No results for input(s): "LIPASE", "AMYLASE" in the last 168 hours. No results for input(s): "AMMONIA" in the last 168 hours. CBC: Recent Labs  Lab 07/28/23 1027 07/29/23 0641 07/30/23 0555 07/31/23 0517 08/01/23 0517  WBC 12.5* 7.0 9.7 9.9 11.3*  NEUTROABS 8.8* 4.9  --   --   --   HGB 13.0 9.8* 10.2* 10.5* 9.6*  HCT 37.9* 28.2* 29.7* 30.3* 27.5*  MCV 94.8 92.8 93.4 94.4 92.9  PLT 195 149* 154 163 172   Cardiac Enzymes: Recent Labs  Lab 07/28/23 1027 07/30/23 0555 07/31/23 0517  CKTOTAL 3,296* 1,195* 564*   BNP: Invalid input(s): "POCBNP" CBG: Recent Labs  Lab 07/30/23 1413  GLUCAP 129*       IMAGING RESULTS:    Study Result  Narrative & Impression  CLINICAL DATA:  Cough.   EXAM: PORTABLE CHEST 1 VIEW   COMPARISON:  Chest x-ray dated July 28, 2023.   FINDINGS: The patient is rotated to the right. Stable cardiomediastinal silhouette. Patchy opacities throughout the right lung, slightly improved in the right lung apex and slightly worsened at the right lung base. The left lung is clear apart from apical pleuroparenchymal scarring.  Possible small right pleural effusion. No pneumothorax. No acute osseous abnormality.   IMPRESSION: 1. Patchy opacities throughout the right lung, slightly improved in the right lung apex and slightly worsened at the right lung base, concerning for multifocal pneumonia.     Electronically Signed   By: Obie Dredge M.D.   On: 07/30/2023 17:00   ASSESSMENT AND PLAN    -Multidisciplinary rounds held today  Acute Hypoxic Respiratory Failure -continue treatment for CAP as current -continue steroids mucomyst  -refined steroids to decadron 8mg  daily with weaning by 1mg  daily   Chronic emphysema with COPD   Continue steroids and nebs     Atelectasis   - IS at bedside  Renal Failure-CKD 3a -follow chem 7 -follow UO -continue Foley Catheter-assess need daily  ID -continue IV abx as prescibed -follow up cultures  GI/Nutrition GI PROPHYLAXIS as indicated DIET-->TF's as tolerated Constipation protocol as indicated  ENDO - ICU hypoglycemic\Hyperglycemia protocol -check FSBS per protocol   ELECTROLYTES -follow labs as needed -replace as needed -pharmacy consultation   DVT/GI PRX ordered -SCDs  TRANSFUSIONS AS NEEDED MONITOR FSBS ASSESS the need for LABS as needed    Critical care provider statement:   Total critical care time: 33 minutes   Performed by: Karna Christmas MD   Critical  care time was exclusive of separately billable procedures and treating other patients.   Critical care was necessary to treat or prevent imminent or life-threatening deterioration.   Critical care was time spent personally by me on the following activities: development of treatment plan with patient and/or surrogate as well as nursing, discussions with consultants, evaluation of patient's response to treatment, examination of patient, obtaining history from patient or surrogate, ordering and performing treatments and interventions, ordering and review of laboratory studies, ordering  and review of radiographic studies, pulse oximetry and re-evaluation of patient's condition.    Vida Rigger, M.D.  Pulmonary & Critical Care Medicine

## 2023-08-02 DIAGNOSIS — J9601 Acute respiratory failure with hypoxia: Secondary | ICD-10-CM | POA: Diagnosis not present

## 2023-08-02 LAB — CULTURE, BLOOD (ROUTINE X 2)
Culture: NO GROWTH
Culture: NO GROWTH

## 2023-08-02 LAB — BASIC METABOLIC PANEL
Anion gap: 9 (ref 5–15)
BUN: 27 mg/dL — ABNORMAL HIGH (ref 8–23)
CO2: 28 mmol/L (ref 22–32)
Calcium: 8.8 mg/dL — ABNORMAL LOW (ref 8.9–10.3)
Chloride: 100 mmol/L (ref 98–111)
Creatinine, Ser: 0.92 mg/dL (ref 0.61–1.24)
GFR, Estimated: >60 mL/min (ref >60)
Glucose, Bld: 138 mg/dL — ABNORMAL HIGH (ref 70–99)
Potassium: 4.9 mmol/L (ref 3.5–5.1)
Sodium: 137 mmol/L (ref 135–145)

## 2023-08-02 MED ORDER — POTASSIUM CHLORIDE 20 MEQ PO PACK
40.0000 meq | PACK | ORAL | Status: DC
  Administered 2023-08-02: 40 meq via ORAL
  Filled 2023-08-02: qty 2

## 2023-08-02 NOTE — Progress Notes (Signed)
 Progress Note   Patient: Paul Vaughan ZOX:[id number] DOB: 01-02-1956 DOA: 07/28/2023     5 DOS: the patient was seen and examined on 08/02/2023   Brief hospital course: 68 y.o. male with medical history significant of dementia, CKD stage IIIa, COPD, CHF, who presents to the ED due to generalized weakness.   History obtained through chart review due to patient's underlying dementia. Caregiver previously at bedside noted patient has been experiencing increased generalized weakness with poor appetite and episodes of urinary continence with increasing altered mental status over the last few days.  No reported fevers, vomiting, shortness of breath, chest pain, diarrhea.   ED course: On arrival to the ED, patient was hypotensive at 86/53 with heart rate of 90.  He was saturating at 88% on room air and subsequently placed on 3 L.  He was afebrile at 98.6.  Initial workup notable for WBC 12.5, BUN 64, creatinine 2.27, AST 189, ALT 76, GFR of 31.  Urinalysis with no leukocytes, nitrites or bacteria.  Chest x-ray with chronic interstitial lung changes, however increased opacities compared to prior with possible right apical pneumonia.  CT of the abdomen with no acute findings.  Patient started on IV fluids, azithromycin, ceftriaxone, DuoNebs, and Solu-Medrol.  TRH contacted for admission.  3/17.  Continue IV antibiotics for sepsis and pneumonia, taper oxygen if able, IV fluids for rhabdomyolysis. 3/18.  Patient's respiratory status worsened requiring to be placed on 100% nonrebreather.  Suspect aspiration.  Chest x-ray done but pending results ABG showing a pO2 of 111 and a pH of 7.36 with a pCO2 of 43.  Patient admitted with sepsis and rhabdomyolysis.  CK down to 1195.  Transferred to stepdown unit.  Made NPO.  Changed Maxipime over to Unasyn.  I assumed care on 07/31/23.  3/19 -- respiratory status stabilized, oxygen sats stable on 4 L/min.  Stable to transfer to med/tele floor this afternoon.  3/20  -- on 4 L/min [place] O2, sats overall low 90's.   Assessment and Plan:  * Acute hypoxic respiratory failure (HCC) Patient with a pulse ox of 88% on room air on admission, due to sepsis from pneumonia.  3/18 developed acute worsening hypoxia after aspiration episode. Transferred to stepdown and pulmonology was consulted. 3/19--21: stable on 4 L/min [place] O2 with spO2 90-93% --Supplement O2 per protocol --Target sats > 90% --Appreciate Pulmonology's input --Steroids -- Decadron taper per Pulm --Mucomyst nebs --Mgmt of PNA as outlined below.  Severe sepsis  Present on admission with bilateral pneumonia, hypotension, leukocytosis, tachycardia elevated lactic acid and acute hypoxic respiratory failure and acute kidney injury.  Continue IVF.  Change antibiotics to Unasyn and Zithromax  Multifocal pneumonia --Continue Rocephin (day 5), completed Zithromax (day 5), Flagyl (day 4) --Supportive care per orders  Hypokalemia - K 2.9 on 3/20 was replaced. Mg is 2.0. --Today's BMP pending - follow and replace K as needed --BMP in AM  Acute kidney injury superimposed on CKD (HCC) AKI on CKD stage IIIa.   Creatinine 2.27 on presentation.  Normalized to baseline. --Monitor BMP  Elevated LFTs Likely secondary to sepsis --Trend LFT's  Chronic obstructive pulmonary disease, unspecified (HCC) --Continue nebulizers and Solu-Medrol --Pulmonology following  Rhabdomyolysis Hold Crestor.  No falls at home.  PT evaluation.  Continue gentle IV fluids.  CK trended down to 1195.  Check CK tomorrow.  Chronic diastolic CHF (congestive heart failure) (HCC) Currently no signs of heart failure.  Watch closely with gentle fluids.  Echo back in 2024 showed EF greater  than 55%.  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  Essential (primary) hypertension Hold home antihypertensives        Subjective: Patient awake resting in bed.  He denies any complaints including shortness of breath, fever/chills or chest pain.  No acute events reported.   Physical Exam: Vitals:   08/02/23 0443 08/02/23 0740 08/02/23 0749 08/02/23 1130  BP:  (!) 179/89  127/72  Pulse:  83  85  Resp:  20  19  Temp:  98.3 F (36.8 C)  98.4 F (36.9 C)  TempSrc:  Oral  Oral  SpO2:  91% 90% 92%  Weight: 83.3 kg     Height:       General exam: awake, alert, no acute distress HEENT: moist mucus membranes, hearing grossly normal  Respiratory system: CTAB generally diminished, no wheezes, normal respiratory effort at rest on 4 L/min [place] O2. Cardiovascular system: normal S1/S2, RRR, no pedal edema.   Gastrointestinal system: soft, NT, ND, no HSM felt, +bowel sounds. Central nervous system: A&O x 4. no gross focal neurologic deficits, normal speech Extremities: moves all , no edema, normal tone Psychiatry: normal mood, flat affect     Data Reviewed:  Today's labs are pending.  Notable labs 3/20 -- BUN 37, Ca 8.0, albumin 2.1, AST 66>>45, ALT 53>>42 normalized, Hbg 10.5 >> 9.6 overall stable.  Family Communication: None at bedside on rounds, will attempt to call this afternoon as time allows.  Disposition: Status is: Inpatient Remains inpatient appropriate because: remains on IV therapies pending further improvement and weaning down oxygen   Planned Discharge Destination: return to prior group home    Time spent: 36 minutes   Author: Pennie Banter, DO 08/02/2023 12:41 PM  For on call review [email or web address].

## 2023-08-02 NOTE — Progress Notes (Signed)
 PULMONOLOGY     Name: Paul Vaughan MRN: [id number] DOB: Jul 12, 1955     LOS: 5   SUBJECTIVE FINDINGS & SIGNIFICANT EVENTS    History of Presenting Illness:  68 yo M with hx of dementia, CKD3a, copd, CHF, who came in with altered mental status.  He was noted to be hypoxemic and bloodwork showed leukocytosis.  He had cxr done with findings of infiltrate of right lung. He is being treated for pneumonia. He was started on rocephin/zithromax for CAP regimen. His CBC showed resolution of leukocytosis. His BMP showes hypoalbuminemia and AKI which is interval improved. Patient with increased O2 req and pCCM consultation was placed for additional evaluation and management.   08/02/23- patient unchanged from yesterday.  He is n 4L/min.  CXR looks better then before with multifocal pneumonia.   Lines/tubes : External Urinary Catheter (Active)    Microbiology/Sepsis markers: Results for orders placed or performed during the hospital encounter of 07/28/23  Culture, blood (Routine x 2)     Status: None   Collection Time: 07/28/23 10:27 AM   Specimen: BLOOD  Result Value Ref Range Status   Specimen Description BLOOD RIGHT ANTECUBITAL  Final   Special Requests   Final    BOTTLES DRAWN AEROBIC AND ANAEROBIC Blood Culture results may not be optimal due to an inadequate volume of blood received in culture bottles   Culture   Final    NO GROWTH 5 DAYS Performed at [facility], [address] [place], [place] [address]    Report Status 08/02/2023 FINAL  Final  Culture, blood (Routine x 2)     Status: None   Collection Time: 07/28/23 11:00 AM   Specimen: BLOOD  Result Value Ref Range Status   Specimen Description BLOOD BLOOD LEFT FOREARM  Final   Special Requests   Final    BOTTLES DRAWN AEROBIC AND ANAEROBIC  Blood Culture results may not be optimal due to an inadequate volume of blood received in culture bottles   Culture   Final    NO GROWTH 5 DAYS Performed at [facility], [address] [place], [place] [address]    Report Status 08/02/2023 FINAL  Final  Resp panel by RT-PCR (RSV, Flu A&B, Covid) Anterior Nasal Swab     Status: None   Collection Time: 07/28/23  2:43 PM   Specimen: Anterior Nasal Swab  Result Value Ref Range Status   SARS Coronavirus 2 by RT PCR NEGATIVE NEGATIVE Final    Comment: (NOTE) SARS-CoV-2 target nucleic acids are NOT DETECTED.  The SARS-CoV-2 RNA is generally detectable in upper respiratory specimens during the acute phase of infection. The lowest concentration of SARS-CoV-2 viral copies this assay can detect is 138 copies/mL. A negative result does not preclude SARS-Cov-2 infection and should not be used as the sole basis for treatment or other patient management decisions. A negative result may occur with  improper specimen collection/handling, submission of specimen other than nasopharyngeal swab, presence of viral mutation(s) within the areas targeted by this assay, and inadequate number of viral copies(<138 copies/mL). A negative result must be combined with clinical observations, patient history, and epidemiological information. The expected result is Negative.  Fact Sheet for Patients:  BloggerCourse.com  Fact Sheet for Healthcare Providers:  SeriousBroker.it  This test is no t yet approved or cleared by the [place] FDA and  has been authorized for detection and/or diagnosis of SARS-CoV-2 by FDA under an Emergency Use Authorization (EUA). This EUA will remain  in effect (meaning this test can be used) for the duration of the COVID-19 declaration under Section 564(b)(1) of the Act, 21 U.S.C.section 360bbb-3(b)(1), unless the authorization is terminated  or revoked sooner.        Influenza A by PCR NEGATIVE NEGATIVE Final   Influenza B by PCR NEGATIVE NEGATIVE Final    Comment: (NOTE) The Xpert Xpress SARS-CoV-2/FLU/RSV plus assay is intended as an aid in the diagnosis of influenza from Nasopharyngeal swab specimens and should not be used as a sole basis for treatment. Nasal washings and aspirates are unacceptable for Xpert Xpress SARS-CoV-2/FLU/RSV testing.  Fact Sheet for Patients: BloggerCourse.com  Fact Sheet for Healthcare Providers: SeriousBroker.it  This test is not yet approved or cleared by the [place] FDA and has been authorized for detection and/or diagnosis of SARS-CoV-2 by FDA under an Emergency Use Authorization (EUA). This EUA will remain in effect (meaning this test can be used) for the duration of the COVID-19 declaration under Section 564(b)(1) of the Act, 21 U.S.C. section 360bbb-3(b)(1), unless the authorization is terminated or revoked.     Resp Syncytial Virus by PCR NEGATIVE NEGATIVE Final    Comment: (NOTE) Fact Sheet for Patients: BloggerCourse.com  Fact Sheet for Healthcare Providers: SeriousBroker.it  This test is not yet approved or cleared by the [place] FDA and has been authorized for detection and/or diagnosis of SARS-CoV-2 by FDA under an Emergency Use Authorization (EUA). This EUA will remain in effect (meaning this test can be used) for the duration of the COVID-19 declaration under Section 564(b)(1) of the Act, 21 U.S.C. section 360bbb-3(b)(1), unless the authorization is terminated or revoked.  Performed at [facility], [address] [place], [place] [address]   MRSA Next Gen by PCR, Nasal     Status: None   Collection Time: 07/30/23  2:24 PM   Specimen: Nasal Mucosa; Nasal Swab  Result Value Ref Range Status   MRSA by PCR Next Gen NOT DETECTED NOT DETECTED Final    Comment: (NOTE) The  GeneXpert MRSA Assay (FDA approved for NASAL specimens only), is one component of a comprehensive MRSA colonization surveillance program. It is not intended to diagnose MRSA infection nor to guide or monitor treatment for MRSA infections. Test performance is not FDA approved in patients less than 71 years old. Performed at [facility], [address] [place], [place] [address]     Anti-infectives:  Anti-infectives (From admission, onward)    Start     Dose/Rate Route Frequency Ordered Stop   07/30/23 1600  metroNIDAZOLE (FLAGYL) IVPB 500 mg        500 mg 100 mL/hr over 60 Minutes Intravenous Every 8 hours 07/30/23 1415     07/30/23 1515  cefTRIAXone (ROCEPHIN) 2 g in sodium chloride 0.9 % 100 mL IVPB        2 g 200 mL/hr over 30 Minutes Intravenous Daily 07/30/23 1415     07/29/23 1200  azithromycin (ZITHROMAX) 500 mg in sodium chloride 0.9 % 250 mL IVPB        500 mg 250 mL/hr over 60 Minutes Intravenous Every 24 hours 07/28/23 1353 08/01/23 1255   07/28/23 1500  ceFEPIme (MAXIPIME) 2 g in sodium chloride 0.9 % 100 mL IVPB  Status:  Discontinued        2 g 200 mL/hr over 30 Minutes Intravenous Every 12 hours 07/28/23 1354 07/30/23 1336   07/28/23 1100  cefTRIAXone (ROCEPHIN) 2 g in sodium chloride 0.9 % 100 mL IVPB  2 g 200 mL/hr over 30 Minutes Intravenous  Once 07/28/23 1057 07/28/23 1210   07/28/23 1100  azithromycin (ZITHROMAX) 500 mg in sodium chloride 0.9 % 250 mL IVPB        500 mg 250 mL/hr over 60 Minutes Intravenous  Once 07/28/23 1057 07/28/23 1255        Consults: Treatment Team:  Vida Rigger, MD     PAST MEDICAL HISTORY   Past Medical History:  Diagnosis Date   CHF (congestive heart failure) (HCC)    Chronic kidney disease    COPD (chronic obstructive pulmonary disease) (HCC)    Dementia (HCC)      SURGICAL HISTORY   History reviewed. No pertinent surgical history.   FAMILY HISTORY   History reviewed. No pertinent family  history.   SOCIAL HISTORY   Social History   Tobacco Use   Smoking status: Every Day    Current packs/day: 1.00    Types: Cigarettes   Smokeless tobacco: Never  Substance Use Topics   Alcohol use: No   Drug use: No     MEDICATIONS   Current Medication:  Current Facility-Administered Medications:    acetaminophen (TYLENOL) tablet 650 mg, 650 mg, Oral, Q6H PRN **OR** acetaminophen (TYLENOL) suppository 650 mg, 650 mg, Rectal, Q6H PRN, Verdene Lennert, MD   albumin human 25 % solution 12.5 g, 12.5 g, Intravenous, Daily, Vida Rigger, MD, Last Rate: 60 mL/hr at 08/01/23 2101, 12.5 g at 08/01/23 2101   albuterol (PROVENTIL) (2.5 MG/3ML) 0.083% nebulizer solution 2.5 mg, 2.5 mg, Nebulization, Q4H PRN, Pennie Banter, DO   aspirin EC tablet 81 mg, 81 mg, Oral, Daily, Verdene Lennert, MD, 81 mg at 08/01/23 0912   cefTRIAXone (ROCEPHIN) 2 g in sodium chloride 0.9 % 100 mL IVPB, 2 g, Intravenous, Daily, Bettey Costa, RPH, Stopped at 08/01/23 4742   Chlorhexidine Gluconate Cloth 2 % PADS 6 each, 6 each, Topical, Daily, Wieting, Richard, MD, 6 each at 08/01/23 1028   dexamethasone (DECADRON) injection 8 mg, 8 mg, Intravenous, Once **FOLLOWED BY** [START ON 08/03/2023] dexamethasone (DECADRON) injection 7 mg, 7 mg, Intravenous, Once **FOLLOWED BY** [START ON 08/04/2023] dexamethasone (DECADRON) injection 6 mg, 6 mg, Intravenous, Once **FOLLOWED BY** [START ON 08/05/2023] dexamethasone (DECADRON) injection 5 mg, 5 mg, Intravenous, Once **FOLLOWED BY** [START ON 08/06/2023] dexamethasone (DECADRON) injection 4 mg, 4 mg, Intravenous, Once **FOLLOWED BY** [START ON 08/07/2023] dexamethasone (DECADRON) injection 3 mg, 3 mg, Intravenous, Once **FOLLOWED BY** [START ON 08/08/2023] dexamethasone (DECADRON) injection 2 mg, 2 mg, Intravenous, Once **FOLLOWED BY** [START ON 08/09/2023] dexamethasone (DECADRON) injection 1 mg, 1 mg, Intravenous, Once, Hunt, Madison H, RPH   divalproex (DEPAKOTE ER) 24 hr  tablet 500 mg, 500 mg, Oral, Daily, 500 mg at 08/01/23 0911 **AND** divalproex (DEPAKOTE ER) 24 hr tablet 1,000 mg, 1,000 mg, Oral, QHS, [place], Hubbard Hartshorn, NP, 1,000 mg at 08/01/23 2108   enoxaparin (LOVENOX) injection 40 mg, 40 mg, Subcutaneous, Q24H, Jarrick, Fjeld, RPH, 40 mg at 08/01/23 2107   escitalopram (LEXAPRO) tablet 5 mg, 5 mg, Oral, QHS, Verdene Lennert, MD, 5 mg at 08/01/23 2109   feeding supplement (ENSURE ENLIVE / ENSURE PLUS) liquid 237 mL, 237 mL, Oral, TID BM, Esaw Grandchild A, DO, 237 mL at 08/01/23 2032   fluticasone (FLONASE) 50 MCG/ACT nasal spray 2 spray, 2 spray, Each Nare, Daily, Wieting, Richard, MD, 2 spray at 08/01/23 1028   folic acid (FOLVITE) tablet 1 mg, 1 mg, Oral, Q1200, Verdene Lennert, MD, 1 mg at  08/01/23 1240   gabapentin (NEURONTIN) capsule 100 mg, 100 mg, Oral, TID, Verdene Lennert, MD, 100 mg at 08/01/23 2109   ipratropium-albuterol (DUONEB) 0.5-2.5 (3) MG/3ML nebulizer solution 3 mL, 3 mL, Nebulization, TID, Esaw Grandchild A, DO, 3 mL at 08/02/23 0748   metroNIDAZOLE (FLAGYL) IVPB 500 mg, 500 mg, Intravenous, Q8H, Nazari, Walid A, RPH, Last Rate: 100 mL/hr at 08/01/23 2359, 500 mg at 08/01/23 2359   multivitamin with minerals tablet 1 tablet, 1 tablet, Oral, Daily, Esaw Grandchild A, DO, 1 tablet at 08/01/23 0912   ondansetron (ZOFRAN) tablet 4 mg, 4 mg, Oral, Q6H PRN **OR** ondansetron (ZOFRAN) injection 4 mg, 4 mg, Intravenous, Q6H PRN, Verdene Lennert, MD   polyethylene glycol (MIRALAX / GLYCOLAX) packet 17 g, 17 g, Oral, Daily PRN, Verdene Lennert, MD   pyridOXINE (VITAMIN B6) tablet 100 mg, 100 mg, Oral, QHS, Verdene Lennert, MD, 100 mg at 08/01/23 2109   QUEtiapine (SEROQUEL) tablet 150 mg, 150 mg, Oral, QHS, Verdene Lennert, MD, 150 mg at 08/01/23 2108   QUEtiapine (SEROQUEL) tablet 50 mg, 50 mg, Oral, Daily, 50 mg at 08/01/23 1028 **AND** QUEtiapine (SEROQUEL) tablet 50 mg, 50 mg, Oral, Q1400, Hunt, Madison H, RPH, 50 mg at 08/01/23 1714    sodium chloride flush (NS) 0.9 % injection 3 mL, 3 mL, Intravenous, Q12H, Verdene Lennert, MD, 3 mL at 08/01/23 2102   thiamine (VITAMIN B1) tablet 100 mg, 100 mg, Oral, Q1200, Verdene Lennert, MD, 100 mg at 08/01/23 1240    ALLERGIES   Penicillins and Sulfa antibiotics    REVIEW OF SYSTEMS     10 point ROS done and is negative except as per subjective findings  PHYSICAL EXAMINATION   Vital Signs: Temp:  [97.5 F (36.4 C)-98.3 F (36.8 C)] 98.3 F (36.8 C) (03/21 0740) Pulse Rate:  [73-98] 83 (03/21 0740) Resp:  [16-20] 20 (03/21 0740) BP: (84-179)/(52-89) 179/89 (03/21 0740) SpO2:  [85 %-94 %] 90 % (03/21 0749) Weight:  [83.3 kg] 83.3 kg (03/21 0443)  GENERAL:Mild distress due to acutely ill process HEAD: Normocephalic, atraumatic.  EYES: Pupils equal, round, reactive to light.  No scleral icterus.  MOUTH: Moist mucosal membrane. NECK: Supple. No thyromegaly. No nodules. No JVD.  PULMONARY: ronchi mild worse on right CARDIOVASCULAR: S1 and S2. Regular rate and rhythm. No murmurs, rubs, or gallops.  GASTROINTESTINAL: Soft, nontender, non-distended. No masses. Positive bowel sounds. No hepatosplenomegaly.  MUSCULOSKELETAL: No swelling, clubbing, or edema.  NEUROLOGIC: Mild distress due to acute illness SKIN:intact,warm,dry   PERTINENT DATA     Infusions:  albumin human 12.5 g (08/01/23 2101)   cefTRIAXone (ROCEPHIN)  IV Stopped (08/01/23 1610)   metronidazole 500 mg (08/01/23 2359)   Scheduled Medications:  aspirin EC  81 mg Oral Daily   Chlorhexidine Gluconate Cloth  6 each Topical Daily   dexamethasone (DECADRON) injection  8 mg Intravenous Once   Followed by   Melene Muller ON 08/03/2023] dexamethasone (DECADRON) injection  7 mg Intravenous Once   Followed by   Melene Muller ON 08/04/2023] dexamethasone (DECADRON) injection  6 mg Intravenous Once   Followed by   Melene Muller ON 08/05/2023] dexamethasone (DECADRON) injection  5 mg Intravenous Once   Followed by   Melene Muller ON  08/06/2023] dexamethasone (DECADRON) injection  4 mg Intravenous Once   Followed by   Melene Muller ON 08/07/2023] dexamethasone (DECADRON) injection  3 mg Intravenous Once   Followed by   Melene Muller ON 08/08/2023] dexamethasone (DECADRON) injection  2 mg Intravenous Once   Followed by   [  START ON 08/09/2023] dexamethasone (DECADRON) injection  1 mg Intravenous Once   divalproex  500 mg Oral Daily   And   divalproex  1,000 mg Oral QHS   enoxaparin (LOVENOX) injection  40 mg Subcutaneous Q24H   escitalopram  5 mg Oral QHS   feeding supplement  237 mL Oral TID BM   fluticasone  2 spray Each Nare Daily   folic acid  1 mg Oral Q1200   gabapentin  100 mg Oral TID   ipratropium-albuterol  3 mL Nebulization TID   multivitamin with minerals  1 tablet Oral Daily   pyridOXINE  100 mg Oral QHS   QUEtiapine  150 mg Oral QHS   QUEtiapine  50 mg Oral Daily   And   QUEtiapine  50 mg Oral Q1400   sodium chloride flush  3 mL Intravenous Q12H   thiamine  100 mg Oral Q1200   PRN Medications: acetaminophen **OR** acetaminophen, albuterol, ondansetron **OR** ondansetron (ZOFRAN) IV, polyethylene glycol Hemodynamic parameters:   Intake/Output: 03/20 0701 - 03/21 0700 In: 100 [IV Piggyback:100] Out: -   Ventilator  Settings:     LAB RESULTS:  Basic Metabolic Panel: Recent Labs  Lab 07/28/23 1027 07/29/23 0641 07/31/23 0517 08/01/23 0517  NA 134* 139 138 138  K 3.2* 3.8 3.8 2.9*  CL 96* 104 102 103  CO2 26 25 26 27   GLUCOSE 114* 138* 98 80  BUN 64* 49* 39* 37*  CREATININE 2.27* 1.33* 1.04 0.90  CALCIUM 8.4* 8.0* 8.6* 8.0*  MG  --   --  2.3 2.0  PHOS  --   --  3.6  --    Liver Function Tests: Recent Labs  Lab 07/28/23 1027 07/29/23 0641 07/31/23 0517 08/01/23 0517  AST 189* 134* 66* 45*  ALT 76* 62* 53* 42  ALKPHOS 47 36* 37* 43  BILITOT 0.7 0.5 0.4 0.4  PROT 7.5 5.6* 5.9* 4.8*  ALBUMIN 3.3* 2.3* 2.5* 2.1*   No results for input(s): "LIPASE", "AMYLASE" in the last 168 hours. No  results for input(s): "AMMONIA" in the last 168 hours. CBC: Recent Labs  Lab 07/28/23 1027 07/29/23 0641 07/30/23 0555 07/31/23 0517 08/01/23 0517  WBC 12.5* 7.0 9.7 9.9 11.3*  NEUTROABS 8.8* 4.9  --   --   --   HGB 13.0 9.8* 10.2* 10.5* 9.6*  HCT 37.9* 28.2* 29.7* 30.3* 27.5*  MCV 94.8 92.8 93.4 94.4 92.9  PLT 195 149* 154 163 172   Cardiac Enzymes: Recent Labs  Lab 07/28/23 1027 07/30/23 0555 07/31/23 0517  CKTOTAL 3,296* 1,195* 564*   BNP: Invalid input(s): "POCBNP" CBG: Recent Labs  Lab 07/30/23 1413  GLUCAP 129*       IMAGING RESULTS:    Study Result  Narrative & Impression  CLINICAL DATA:  Cough.   EXAM: PORTABLE CHEST 1 VIEW   COMPARISON:  Chest x-ray dated July 28, 2023.   FINDINGS: The patient is rotated to the right. Stable cardiomediastinal silhouette. Patchy opacities throughout the right lung, slightly improved in the right lung apex and slightly worsened at the right lung base. The left lung is clear apart from apical pleuroparenchymal scarring. Possible small right pleural effusion. No pneumothorax. No acute osseous abnormality.   IMPRESSION: 1. Patchy opacities throughout the right lung, slightly improved in the right lung apex and slightly worsened at the right lung base, concerning for multifocal pneumonia.     Electronically Signed   By: Obie Dredge M.D.   On: 07/30/2023 17:00  ASSESSMENT AND PLAN    -Multidisciplinary rounds held today  Acute Hypoxic Respiratory Failure -continue treatment for CAP as current -continue steroids mucomyst  -refined steroids to decadron 8mg  daily with weaning by 1mg  daily   Chronic emphysema with COPD   Continue steroids and nebs     Atelectasis   - IS at bedside  Renal Failure-CKD 3a -follow chem 7 -follow UO -continue Foley Catheter-assess need daily  ID -continue IV abx as prescibed -follow up cultures  GI/Nutrition GI PROPHYLAXIS as indicated DIET-->TF's as  tolerated Constipation protocol as indicated  ENDO - ICU hypoglycemic\Hyperglycemia protocol -check FSBS per protocol   ELECTROLYTES -follow labs as needed -replace as needed -pharmacy consultation   DVT/GI PRX ordered -SCDs  TRANSFUSIONS AS NEEDED MONITOR FSBS ASSESS the need for LABS as needed        Vida Rigger, M.D.  Pulmonary & Critical Care Medicine

## 2023-08-02 NOTE — Evaluation (Signed)
 Occupational Therapy Evaluation Patient Details Name: Paul Vaughan MRN: [id number] DOB: 21-Jan-1956 Today's Date: 08/02/2023   History of Present Illness   Patient is a 68 year old male who presented to ED with weakness and limited ability to ambulate. Patient lives at [facility] and [facility]. PMH includes dementia, CKD stage IIIa, HTN. He was admitted with COVID and sepsis.     Clinical Impressions Pt seen for OT re-evaluation this date. Nurse present addressing IV and medications. Pt denies pain, recalls therapist, oriented to self and place. Denies SOB. Pt initially agreeable but then declines EOB/OOB. Will not give reason but then with encouragement for participating in bed mobility for improved positioning for taking medications and eating/drinking, pt noted to have had large loose BM. RN confirmed she would notify NT promptly to assist. Pt requiring increased assist for ADL and mobility from previous evaluation. Recommendation updated to reflect lack of progress.      If plan is discharge home, recommend the following:   Assistance with cooking/housework;Assist for transportation;Direct supervision/assist for medications management;Direct supervision/assist for financial management;Supervision due to cognitive status;Help with stairs or ramp for entrance;A lot of help with walking and/or transfers;A lot of help with bathing/dressing/bathroom      Equipment Recommendations   Other (comment) (defer)      Precautions/Restrictions   Precautions Precautions: Fall Recall of Precautions/Restrictions: Impaired Restrictions Weight Bearing Restrictions Per Provider Order: No     Mobility Bed Mobility Overal bed mobility: Needs Assistance             General bed mobility comments: MAX +2 for boosting up in bed, pt declined EOB/OOB    Transfers                   General transfer comment: declined      Balance                                            ADL either performed or assessed with clinical judgement   ADL Overall ADL's : Needs assistance/impaired Eating/Feeding: Bed level;Sitting;Set up   Grooming: Bed level;Wash/dry face;Set up                                       Vision         Perception         Praxis         Pertinent Vitals/Pain Pain Assessment Pain Assessment: No/denies pain     Extremity/Trunk Assessment Upper Extremity Assessment Upper Extremity Assessment: Generalized weakness   Lower Extremity Assessment Lower Extremity Assessment: Generalized weakness   Cervical / Trunk Assessment Cervical / Trunk Assessment: Normal   Communication Communication Communication: Impaired Factors Affecting Communication: Reduced clarity of speech   Cognition Arousal: Alert Behavior During Therapy: Flat affect Cognition: No family/caregiver present to determine baseline             OT - Cognition Comments: Pt alert, oriented to self, and that he is from a group home; decreased safety awareness and problem solving noted                 Following commands: Intact       Cueing  General Comments   Cueing Techniques: Verbal cues;Gestural cues      Exercises  Shoulder Instructions      Home Living Family/patient expects to be discharged to:: Skilled nursing facility Living Arrangements: Group Home Available Help at Discharge: Other (Comment);Available 24 hours/day                         Home Equipment: None          Prior Functioning/Environment Prior Level of Function : Needs assist;History of Falls (last six months);Patient poor historian/Family not available             Mobility Comments: Pt denies AD for mobility but does endorse several recent falls ADLs Comments: Pt reports indep with basic ADL, has assist from staff for IADL    OT Problem List: Decreased strength;Cardiopulmonary status limiting activity;Decreased  cognition;Decreased activity tolerance;Decreased safety awareness;Impaired balance (sitting and/or standing);Decreased knowledge of use of DME or AE   OT Treatment/Interventions: Self-care/ADL training;Therapeutic exercise;Therapeutic activities;Cognitive remediation/compensation;DME and/or AE instruction;Patient/family education;Energy conservation;Balance training      OT Goals(Current goals can be found in the care plan section)   Acute Rehab OT Goals Patient Stated Goal: breathe better OT Goal Formulation: With patient Time For Goal Achievement: 08/16/23 Potential to Achieve Goals: Fair ADL Goals Pt Will Perform Lower Body Dressing: with min assist;sit to/from stand;sitting/lateral leans Pt Will Transfer to Toilet: with min assist;bedside commode (LRAD) Pt Will Perform Toileting - Clothing Manipulation and hygiene: sitting/lateral leans;with supervision   OT Frequency:  Min 2X/week    Co-evaluation              AM-PAC OT "6 Clicks" Daily Activity     Outcome Measure Help from another person eating meals?: None Help from another person taking care of personal grooming?: A Little Help from another person toileting, which includes using toliet, bedpan, or urinal?: A Lot Help from another person bathing (including washing, rinsing, drying)?: A Lot Help from another person to put on and taking off regular upper body clothing?: A Little Help from another person to put on and taking off regular lower body clothing?: A Lot 6 Click Score: 16   End of Session Nurse Communication: Other (comment) (pt soiled and requires assist for cleaning up with NT)  Activity Tolerance: Patient tolerated treatment well Patient left: in bed;with call bell/phone within reach;with bed alarm set;with nursing/sitter in room  OT Visit Diagnosis: Other abnormalities of gait and mobility (R26.89);Repeated falls (R29.6);Muscle weakness (generalized) (M62.81)                Time: 5784-6962 OT Time  Calculation (min): 9 min Charges:  OT General Charges $OT Visit: 1 Visit OT Evaluation $OT Re-eval: 1 Re-eval  Arman Filter., MPH, MS, OTR/L ascom [phone] 08/02/23, 12:50 PM

## 2023-08-03 ENCOUNTER — Inpatient Hospital Stay

## 2023-08-03 DIAGNOSIS — J9601 Acute respiratory failure with hypoxia: Secondary | ICD-10-CM | POA: Diagnosis not present

## 2023-08-03 LAB — CBC
HCT: 28.4 % — ABNORMAL LOW (ref 39.0–52.0)
Hemoglobin: 10 g/dL — ABNORMAL LOW (ref 13.0–17.0)
MCH: 32.8 pg (ref 26.0–34.0)
MCHC: 35.2 g/dL (ref 30.0–36.0)
MCV: 93.1 fL (ref 80.0–100.0)
Platelets: 188 10*3/uL (ref 150–400)
RBC: 3.05 MIL/uL — ABNORMAL LOW (ref 4.22–5.81)
RDW: 15.2 % (ref 11.5–15.5)
WBC: 10.8 10*3/uL — ABNORMAL HIGH (ref 4.0–10.5)
nRBC: 0 % (ref 0.0–0.2)

## 2023-08-03 LAB — BASIC METABOLIC PANEL
Anion gap: 7 (ref 5–15)
BUN: 29 mg/dL — ABNORMAL HIGH (ref 8–23)
CO2: 29 mmol/L (ref 22–32)
Calcium: 8.7 mg/dL — ABNORMAL LOW (ref 8.9–10.3)
Chloride: 99 mmol/L (ref 98–111)
Creatinine, Ser: 0.86 mg/dL (ref 0.61–1.24)
GFR, Estimated: >60 mL/min (ref >60)
Glucose, Bld: 98 mg/dL (ref 70–99)
Potassium: 3.8 mmol/L (ref 3.5–5.1)
Sodium: 135 mmol/L (ref 135–145)

## 2023-08-03 NOTE — Plan of Care (Signed)
   Problem: Fluid Volume: Goal: Hemodynamic stability will improve Outcome: Progressing   Problem: Clinical Measurements: Goal: Diagnostic test results will improve Outcome: Progressing Goal: Signs and symptoms of infection will decrease Outcome: Progressing   Problem: Respiratory: Goal: Ability to maintain adequate ventilation will improve Outcome: Progressing

## 2023-08-03 NOTE — Progress Notes (Signed)
 PULMONOLOGY     Name: Paul Vaughan MRN: [id number] DOB: [birth year]     LOS: 6   SUBJECTIVE FINDINGS & SIGNIFICANT EVENTS    History of Presenting Illness:  68 yo M with hx of dementia, CKD3a, copd, CHF, who came in with altered mental status.  He was noted to be hypoxemic and bloodwork showed leukocytosis.  He had cxr done with findings of infiltrate of right lung. He is being treated for pneumonia. He was started on rocephin/zithromax for CAP regimen. His CBC showed resolution of leukocytosis. His BMP showes hypoalbuminemia and AKI which is interval improved. Patient with increased O2 req and pCCM consultation was placed for additional evaluation and management.   08/03/23- patient is on 3L/min and being optmized for [facility] transfer, PCCM will sign off.  Reviewed medical plan with Dr Denton Lank.  Lines/tubes : External Urinary Catheter (Active)    Microbiology/Sepsis markers: Results for orders placed or performed during the hospital encounter of 07/28/23  Culture, blood (Routine x 2)     Status: None   Collection Time: 07/28/23 10:27 AM   Specimen: BLOOD  Result Value Ref Range Status   Specimen Description BLOOD RIGHT ANTECUBITAL  Final   Special Requests   Final    BOTTLES DRAWN AEROBIC AND ANAEROBIC Blood Culture results may not be optimal due to an inadequate volume of blood received in culture bottles   Culture   Final    NO GROWTH 5 DAYS Performed at [facility], [address] [place], [place] [address]    Report Status 08/02/2023 FINAL  Final  Culture, blood (Routine x 2)     Status: None   Collection Time: 07/28/23 11:00 AM   Specimen: BLOOD  Result Value Ref Range Status   Specimen Description BLOOD BLOOD LEFT FOREARM  Final   Special Requests   Final    BOTTLES DRAWN AEROBIC  AND ANAEROBIC Blood Culture results may not be optimal due to an inadequate volume of blood received in culture bottles   Culture   Final    NO GROWTH 5 DAYS Performed at [facility], [address] [place], [place] [address]    Report Status 08/02/2023 FINAL  Final  Resp panel by RT-PCR (RSV, Flu A&B, Covid) Anterior Nasal Swab     Status: None   Collection Time: 07/28/23  2:43 PM   Specimen: Anterior Nasal Swab  Result Value Ref Range Status   SARS Coronavirus 2 by RT PCR NEGATIVE NEGATIVE Final    Comment: (NOTE) SARS-CoV-2 target nucleic acids are NOT DETECTED.  The SARS-CoV-2 RNA is generally detectable in upper respiratory specimens during the acute phase of infection. The lowest concentration of SARS-CoV-2 viral copies this assay can detect is 138 copies/mL. A negative result does not preclude SARS-Cov-2 infection and should not be used as the sole basis for treatment or other patient management decisions. A negative result may occur with  improper specimen collection/handling, submission of specimen other than nasopharyngeal swab, presence of viral mutation(s) within the areas targeted by this assay, and inadequate number of viral copies(<138 copies/mL). A negative result must be combined with clinical observations, patient history, and epidemiological information. The expected result is Negative.  Fact Sheet for Patients:  BloggerCourse.com  Fact Sheet for Healthcare Providers:  SeriousBroker.it  This test is no t yet approved or cleared by the [place] FDA and  has been authorized for detection and/or diagnosis of SARS-CoV-2 by FDA under an Emergency Use Authorization (EUA). This EUA  will remain  in effect (meaning this test can be used) for the duration of the COVID-19 declaration under Section 564(b)(1) of the Act, 21 U.S.C.section 360bbb-3(b)(1), unless the authorization is terminated  or revoked  sooner.       Influenza A by PCR NEGATIVE NEGATIVE Final   Influenza B by PCR NEGATIVE NEGATIVE Final    Comment: (NOTE) The Xpert Xpress SARS-CoV-2/FLU/RSV plus assay is intended as an aid in the diagnosis of influenza from Nasopharyngeal swab specimens and should not be used as a sole basis for treatment. Nasal washings and aspirates are unacceptable for Xpert Xpress SARS-CoV-2/FLU/RSV testing.  Fact Sheet for Patients: BloggerCourse.com  Fact Sheet for Healthcare Providers: SeriousBroker.it  This test is not yet approved or cleared by the [place] FDA and has been authorized for detection and/or diagnosis of SARS-CoV-2 by FDA under an Emergency Use Authorization (EUA). This EUA will remain in effect (meaning this test can be used) for the duration of the COVID-19 declaration under Section 564(b)(1) of the Act, 21 U.S.C. section 360bbb-3(b)(1), unless the authorization is terminated or revoked.     Resp Syncytial Virus by PCR NEGATIVE NEGATIVE Final    Comment: (NOTE) Fact Sheet for Patients: BloggerCourse.com  Fact Sheet for Healthcare Providers: SeriousBroker.it  This test is not yet approved or cleared by the [place] FDA and has been authorized for detection and/or diagnosis of SARS-CoV-2 by FDA under an Emergency Use Authorization (EUA). This EUA will remain in effect (meaning this test can be used) for the duration of the COVID-19 declaration under Section 564(b)(1) of the Act, 21 U.S.C. section 360bbb-3(b)(1), unless the authorization is terminated or revoked.  Performed at [facility], [address] [place], [place] [address]   MRSA Next Gen by PCR, Nasal     Status: None   Collection Time: 07/30/23  2:24 PM   Specimen: Nasal Mucosa; Nasal Swab  Result Value Ref Range Status   MRSA by PCR Next Gen NOT DETECTED NOT DETECTED Final     Comment: (NOTE) The GeneXpert MRSA Assay (FDA approved for NASAL specimens only), is one component of a comprehensive MRSA colonization surveillance program. It is not intended to diagnose MRSA infection nor to guide or monitor treatment for MRSA infections. Test performance is not FDA approved in patients less than 16 years old. Performed at [facility], [address] [place], [place] [address]     Anti-infectives:  Anti-infectives (From admission, onward)    Start     Dose/Rate Route Frequency Ordered Stop   07/30/23 1600  metroNIDAZOLE (FLAGYL) IVPB 500 mg        500 mg 100 mL/hr over 60 Minutes Intravenous Every 8 hours 07/30/23 1415     07/30/23 1515  cefTRIAXone (ROCEPHIN) 2 g in sodium chloride 0.9 % 100 mL IVPB        2 g 200 mL/hr over 30 Minutes Intravenous Daily 07/30/23 1415     07/29/23 1200  azithromycin (ZITHROMAX) 500 mg in sodium chloride 0.9 % 250 mL IVPB        500 mg 250 mL/hr over 60 Minutes Intravenous Every 24 hours 07/28/23 1353 08/01/23 1255   07/28/23 1500  ceFEPIme (MAXIPIME) 2 g in sodium chloride 0.9 % 100 mL IVPB  Status:  Discontinued        2 g 200 mL/hr over 30 Minutes Intravenous Every 12 hours 07/28/23 1354 07/30/23 1336   07/28/23 1100  cefTRIAXone (ROCEPHIN) 2 g in sodium chloride 0.9 %  100 mL IVPB        2 g 200 mL/hr over 30 Minutes Intravenous  Once 07/28/23 1057 07/28/23 1210   07/28/23 1100  azithromycin (ZITHROMAX) 500 mg in sodium chloride 0.9 % 250 mL IVPB        500 mg 250 mL/hr over 60 Minutes Intravenous  Once 07/28/23 1057 07/28/23 1255        Consults: Treatment Team:  Vida Rigger, MD     PAST MEDICAL HISTORY   Past Medical History:  Diagnosis Date   CHF (congestive heart failure) (HCC)    Chronic kidney disease    COPD (chronic obstructive pulmonary disease) (HCC)    Dementia (HCC)      SURGICAL HISTORY   History reviewed. No pertinent surgical history.   FAMILY HISTORY   History  reviewed. No pertinent family history.   SOCIAL HISTORY   Social History   Tobacco Use   Smoking status: Every Day    Current packs/day: 1.00    Types: Cigarettes   Smokeless tobacco: Never  Substance Use Topics   Alcohol use: No   Drug use: No     MEDICATIONS   Current Medication:  Current Facility-Administered Medications:    acetaminophen (TYLENOL) tablet 650 mg, 650 mg, Oral, Q6H PRN **OR** acetaminophen (TYLENOL) suppository 650 mg, 650 mg, Rectal, Q6H PRN, Verdene Lennert, MD   albumin human 25 % solution 12.5 g, 12.5 g, Intravenous, Daily, Vida Rigger, MD, Last Rate: 60 mL/hr at 08/02/23 1140, 12.5 g at 08/02/23 1140   albuterol (PROVENTIL) (2.5 MG/3ML) 0.083% nebulizer solution 2.5 mg, 2.5 mg, Nebulization, Q4H PRN, Pennie Banter, DO   aspirin EC tablet 81 mg, 81 mg, Oral, Daily, Verdene Lennert, MD, 81 mg at 08/02/23 0841   cefTRIAXone (ROCEPHIN) 2 g in sodium chloride 0.9 % 100 mL IVPB, 2 g, Intravenous, Daily, Ardis Rowan, Walid A, RPH, Last Rate: 200 mL/hr at 08/02/23 1003, 2 g at 08/02/23 1003   Chlorhexidine Gluconate Cloth 2 % PADS 6 each, 6 each, Topical, Daily, Alford Highland, MD, 6 each at 08/02/23 0853   [COMPLETED] dexamethasone (DECADRON) injection 8 mg, 8 mg, Intravenous, Once, 8 mg at 08/02/23 0857 **FOLLOWED BY** dexamethasone (DECADRON) injection 7 mg, 7 mg, Intravenous, Once **FOLLOWED BY** [START ON 08/04/2023] dexamethasone (DECADRON) injection 6 mg, 6 mg, Intravenous, Once **FOLLOWED BY** [START ON 08/05/2023] dexamethasone (DECADRON) injection 5 mg, 5 mg, Intravenous, Once **FOLLOWED BY** [START ON 08/06/2023] dexamethasone (DECADRON) injection 4 mg, 4 mg, Intravenous, Once **FOLLOWED BY** [START ON 08/07/2023] dexamethasone (DECADRON) injection 3 mg, 3 mg, Intravenous, Once **FOLLOWED BY** [START ON 08/08/2023] dexamethasone (DECADRON) injection 2 mg, 2 mg, Intravenous, Once **FOLLOWED BY** [START ON 08/09/2023] dexamethasone (DECADRON) injection 1 mg, 1 mg,  Intravenous, Once, Hunt, Madison H, RPH   divalproex (DEPAKOTE ER) 24 hr tablet 500 mg, 500 mg, Oral, Daily, 500 mg at 08/02/23 0841 **AND** divalproex (DEPAKOTE ER) 24 hr tablet 1,000 mg, 1,000 mg, Oral, QHS, [place], Hubbard Hartshorn, NP, 1,000 mg at 08/02/23 2148   enoxaparin (LOVENOX) injection 40 mg, 40 mg, Subcutaneous, Q24H, Keven, Osborn, RPH, 40 mg at 08/02/23 2147   escitalopram (LEXAPRO) tablet 5 mg, 5 mg, Oral, QHS, Verdene Lennert, MD, 5 mg at 08/02/23 2149   feeding supplement (ENSURE ENLIVE / ENSURE PLUS) liquid 237 mL, 237 mL, Oral, TID BM, Griffith, Kelly A, DO, 237 mL at 08/02/23 2050   fluticasone (FLONASE) 50 MCG/ACT nasal spray 2 spray, 2 spray, Each Nare, Daily, Alford Highland, MD, 2 spray  at 08/02/23 8119   folic acid (FOLVITE) tablet 1 mg, 1 mg, Oral, Q1200, Verdene Lennert, MD, 1 mg at 08/02/23 1131   gabapentin (NEURONTIN) capsule 100 mg, 100 mg, Oral, TID, Verdene Lennert, MD, 100 mg at 08/02/23 2148   ipratropium-albuterol (DUONEB) 0.5-2.5 (3) MG/3ML nebulizer solution 3 mL, 3 mL, Nebulization, TID, Esaw Grandchild A, DO, 3 mL at 08/03/23 0731   metroNIDAZOLE (FLAGYL) IVPB 500 mg, 500 mg, Intravenous, Q8H, Nazari, Walid A, RPH, Last Rate: 100 mL/hr at 08/02/23 2351, 500 mg at 08/02/23 2351   multivitamin with minerals tablet 1 tablet, 1 tablet, Oral, Daily, Esaw Grandchild A, DO, 1 tablet at 08/02/23 0841   ondansetron (ZOFRAN) tablet 4 mg, 4 mg, Oral, Q6H PRN **OR** ondansetron (ZOFRAN) injection 4 mg, 4 mg, Intravenous, Q6H PRN, Verdene Lennert, MD   polyethylene glycol (MIRALAX / GLYCOLAX) packet 17 g, 17 g, Oral, Daily PRN, Verdene Lennert, MD   pyridOXINE (VITAMIN B6) tablet 100 mg, 100 mg, Oral, QHS, Verdene Lennert, MD, 100 mg at 08/02/23 2148   QUEtiapine (SEROQUEL) tablet 150 mg, 150 mg, Oral, QHS, Verdene Lennert, MD, 150 mg at 08/02/23 2148   QUEtiapine (SEROQUEL) tablet 50 mg, 50 mg, Oral, Daily, 50 mg at 08/02/23 0841 **AND** QUEtiapine (SEROQUEL) tablet  50 mg, 50 mg, Oral, Q1400, Hunt, Madison H, RPH, 50 mg at 08/02/23 1511   sodium chloride flush (NS) 0.9 % injection 3 mL, 3 mL, Intravenous, Q12H, Verdene Lennert, MD, 3 mL at 08/02/23 2152   thiamine (VITAMIN B1) tablet 100 mg, 100 mg, Oral, Q1200, Verdene Lennert, MD, 100 mg at 08/02/23 1131    ALLERGIES   Penicillins and Sulfa antibiotics    REVIEW OF SYSTEMS     10 point ROS done and is negative except as per subjective findings  PHYSICAL EXAMINATION   Vital Signs: Temp:  [97.9 F (36.6 C)-98.4 F (36.9 C)] 98.4 F (36.9 C) (03/21 1939) Pulse Rate:  [79-88] 79 (03/22 0616) Resp:  [16-19] 18 (03/21 1939) BP: (127-159)/(72-80) 159/80 (03/22 0616) SpO2:  [90 %-93 %] 93 % (03/22 0733) Weight:  [81.1 kg] 81.1 kg (03/22 0500)  GENERAL:Mild distress due to acutely ill process HEAD: Normocephalic, atraumatic.  EYES: Pupils equal, round, reactive to light.  No scleral icterus.  MOUTH: Moist mucosal membrane. NECK: Supple. No thyromegaly. No nodules. No JVD.  PULMONARY: ronchi mild worse on right CARDIOVASCULAR: S1 and S2. Regular rate and rhythm. No murmurs, rubs, or gallops.  GASTROINTESTINAL: Soft, nontender, non-distended. No masses. Positive bowel sounds. No hepatosplenomegaly.  MUSCULOSKELETAL: No swelling, clubbing, or edema.  NEUROLOGIC: Mild distress due to acute illness SKIN:intact,warm,dry   PERTINENT DATA     Infusions:  albumin human 12.5 g (08/02/23 1140)   cefTRIAXone (ROCEPHIN)  IV 2 g (08/02/23 1003)   metronidazole 500 mg (08/02/23 2351)   Scheduled Medications:  aspirin EC  81 mg Oral Daily   Chlorhexidine Gluconate Cloth  6 each Topical Daily   dexamethasone (DECADRON) injection  7 mg Intravenous Once   Followed by   Melene Muller ON 08/04/2023] dexamethasone (DECADRON) injection  6 mg Intravenous Once   Followed by   Melene Muller ON 08/05/2023] dexamethasone (DECADRON) injection  5 mg Intravenous Once   Followed by   Melene Muller ON 08/06/2023] dexamethasone  (DECADRON) injection  4 mg Intravenous Once   Followed by   Melene Muller ON 08/07/2023] dexamethasone (DECADRON) injection  3 mg Intravenous Once   Followed by   Melene Muller ON 08/08/2023] dexamethasone (DECADRON) injection  2 mg Intravenous Once  Followed by   Melene Muller ON 08/09/2023] dexamethasone (DECADRON) injection  1 mg Intravenous Once   divalproex  500 mg Oral Daily   And   divalproex  1,000 mg Oral QHS   enoxaparin (LOVENOX) injection  40 mg Subcutaneous Q24H   escitalopram  5 mg Oral QHS   feeding supplement  237 mL Oral TID BM   fluticasone  2 spray Each Nare Daily   folic acid  1 mg Oral Q1200   gabapentin  100 mg Oral TID   ipratropium-albuterol  3 mL Nebulization TID   multivitamin with minerals  1 tablet Oral Daily   pyridOXINE  100 mg Oral QHS   QUEtiapine  150 mg Oral QHS   QUEtiapine  50 mg Oral Daily   And   QUEtiapine  50 mg Oral Q1400   sodium chloride flush  3 mL Intravenous Q12H   thiamine  100 mg Oral Q1200   PRN Medications: acetaminophen **OR** acetaminophen, albuterol, ondansetron **OR** ondansetron (ZOFRAN) IV, polyethylene glycol Hemodynamic parameters:   Intake/Output: 03/21 0701 - 03/22 0700 In: -  Out: 1000 [Urine:1000]  Ventilator  Settings:     LAB RESULTS:  Basic Metabolic Panel: Recent Labs  Lab 07/29/23 0641 07/31/23 0517 08/01/23 0517 08/02/23 1321 08/03/23 0546  NA 139 138 138 137 135  K 3.8 3.8 2.9* 4.9 3.8  CL 104 102 103 100 99  CO2 25 26 27 28 29   GLUCOSE 138* 98 80 138* 98  BUN 49* 39* 37* 27* 29*  CREATININE 1.33* 1.04 0.90 0.92 0.86  CALCIUM 8.0* 8.6* 8.0* 8.8* 8.7*  MG  --  2.3 2.0  --   --   PHOS  --  3.6  --   --   --    Liver Function Tests: Recent Labs  Lab 07/28/23 1027 07/29/23 0641 07/31/23 0517 08/01/23 0517  AST 189* 134* 66* 45*  ALT 76* 62* 53* 42  ALKPHOS 47 36* 37* 43  BILITOT 0.7 0.5 0.4 0.4  PROT 7.5 5.6* 5.9* 4.8*  ALBUMIN 3.3* 2.3* 2.5* 2.1*   No results for input(s): "LIPASE", "AMYLASE" in the  last 168 hours. No results for input(s): "AMMONIA" in the last 168 hours. CBC: Recent Labs  Lab 07/28/23 1027 07/29/23 0641 07/30/23 0555 07/31/23 0517 08/01/23 0517 08/03/23 0546  WBC 12.5* 7.0 9.7 9.9 11.3* 10.8*  NEUTROABS 8.8* 4.9  --   --   --   --   HGB 13.0 9.8* 10.2* 10.5* 9.6* 10.0*  HCT 37.9* 28.2* 29.7* 30.3* 27.5* 28.4*  MCV 94.8 92.8 93.4 94.4 92.9 93.1  PLT 195 149* 154 163 172 188   Cardiac Enzymes: Recent Labs  Lab 07/28/23 1027 07/30/23 0555 07/31/23 0517  CKTOTAL 3,296* 1,195* 564*   BNP: Invalid input(s): "POCBNP" CBG: Recent Labs  Lab 07/30/23 1413  GLUCAP 129*       IMAGING RESULTS:    Study Result  Narrative & Impression  CLINICAL DATA:  Cough.   EXAM: PORTABLE CHEST 1 VIEW   COMPARISON:  Chest x-ray dated July 28, 2023.   FINDINGS: The patient is rotated to the right. Stable cardiomediastinal silhouette. Patchy opacities throughout the right lung, slightly improved in the right lung apex and slightly worsened at the right lung base. The left lung is clear apart from apical pleuroparenchymal scarring. Possible small right pleural effusion. No pneumothorax. No acute osseous abnormality.   IMPRESSION: 1. Patchy opacities throughout the right lung, slightly improved in the right lung apex and slightly  worsened at the right lung base, concerning for multifocal pneumonia.     Electronically Signed   By: Obie Dredge M.D.   On: 07/30/2023 17:00   ASSESSMENT AND PLAN    -Multidisciplinary rounds held today  Acute Hypoxic Respiratory Failure -continue treatment for CAP as current -continue steroids mucomyst  -refined steroids to decadron 8mg  daily with weaning by 1mg  daily   Chronic emphysema with COPD   Continue steroids and nebs     Atelectasis   - IS at bedside  Renal Failure-CKD 3a -follow chem 7 -follow UO -continue Foley Catheter-assess need daily  ID -continue IV abx as prescibed -follow up  cultures  GI/Nutrition GI PROPHYLAXIS as indicated DIET-->TF's as tolerated Constipation protocol as indicated  ENDO - ICU hypoglycemic\Hyperglycemia protocol -check FSBS per protocol   ELECTROLYTES -follow labs as needed -replace as needed -pharmacy consultation   DVT/GI PRX ordered -SCDs  TRANSFUSIONS AS NEEDED MONITOR FSBS ASSESS the need for LABS as needed        Vida Rigger, M.D.  Pulmonary & Critical Care Medicine

## 2023-08-03 NOTE — Progress Notes (Addendum)
 Progress Note   Patient: Paul Vaughan YQM:[id number] DOB: 1955-10-02 DOA: 07/28/2023     6 DOS: the patient was seen and examined on 08/03/2023   Brief hospital course: 68 y.o. male with medical history significant of dementia, CKD stage IIIa, COPD, CHF, who presents to the ED due to generalized weakness.   History obtained through chart review due to patient's underlying dementia. Caregiver previously at bedside noted patient has been experiencing increased generalized weakness with poor appetite and episodes of urinary continence with increasing altered mental status over the last few days.  No reported fevers, vomiting, shortness of breath, chest pain, diarrhea.   ED course: On arrival to the ED, patient was hypotensive at 86/53 with heart rate of 90.  He was saturating at 88% on room air and subsequently placed on 3 L.  He was afebrile at 98.6.  Initial workup notable for WBC 12.5, BUN 64, creatinine 2.27, AST 189, ALT 76, GFR of 31.  Urinalysis with no leukocytes, nitrites or bacteria.  Chest x-ray with chronic interstitial lung changes, however increased opacities compared to prior with possible right apical pneumonia.  CT of the abdomen with no acute findings.  Patient started on IV fluids, azithromycin, ceftriaxone, DuoNebs, and Solu-Medrol.  TRH contacted for admission.  3/17.  Continue IV antibiotics for sepsis and pneumonia, taper oxygen if able, IV fluids for rhabdomyolysis. 3/18.  Patient's respiratory status worsened requiring to be placed on 100% nonrebreather.  Suspect aspiration.  Chest x-ray done but pending results ABG showing a pO2 of 111 and a pH of 7.36 with a pCO2 of 43.  Patient admitted with sepsis and rhabdomyolysis.  CK down to 1195.  Transferred to stepdown unit.  Made NPO.  Changed Maxipime over to Unasyn.  I assumed care on 07/31/23.  3/19 -- respiratory status stabilized, oxygen sats stable on 4 L/min.  Stable to transfer to med/tele floor this afternoon.  3/20  -- on 4 L/min [place] O2, sats overall low 90's.   Assessment and Plan:  * Acute hypoxic respiratory failure (HCC) Patient with a pulse ox of 88% on room air on admission, due to sepsis from pneumonia.  3/18 developed acute worsening hypoxia after aspiration episode. Transferred to stepdown and pulmonology was consulted. 3/19--21: stable on 4 L/min [place] O2 with spO2 90-93% --Pulmonology following -- appreciate input --Repeat CXR this AM -- pending, follow --Supplement O2 per protocol --Target sats > 90% --Steroids -- Decadron taper per Pulm --Mucomyst nebs --Mgmt of PNA as outlined below.  Severe sepsis  Present on admission with bilateral pneumonia, hypotension, leukocytosis, tachycardia elevated lactic acid and acute hypoxic respiratory failure and acute kidney injury.  Continue IVF.  Change antibiotics to Unasyn and Zithromax  Multifocal pneumonia --Continue Rocephin (day 5), completed Zithromax (day 5), Flagyl (day 5) --Supportive care per orders  Hypokalemia - K 2.9 on 3/20 was replaced. Mg is 2.0. --Today's BMP pending - follow and replace K as needed --BMP in AM  Acute kidney injury superimposed on CKD (HCC) AKI on CKD stage IIIa.   Creatinine 2.27 on presentation.  Normalized to baseline. --Monitor BMP  Elevated LFTs Likely secondary to sepsis --Trend LFT's  Chronic obstructive pulmonary disease, unspecified (HCC) --Continue nebulizers and steroids as above --Pulmonology following  Rhabdomyolysis Hold Crestor.  No falls at home.  PT evaluation.  Continue gentle IV fluids.  CK trended down to 1195.  Check CK tomorrow.  Chronic diastolic CHF (congestive heart failure) (HCC) Currently no signs of heart failure.  Watch closely  with gentle fluids.  Echo back in 2024 showed EF greater than 55%.  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  Essential  (primary) hypertension Hold home antihypertensives       Subjective: Pt was sleeping, woke easily to voice. He denies any complaints, states he feels well.   Physical Exam: Vitals:   08/03/23 0500 08/03/23 0616 08/03/23 0733 08/03/23 0819  BP:  (!) 159/80  (!) 153/68  Pulse:  79  84  Resp:    18  Temp:    98.5 F (36.9 C)  TempSrc:      SpO2:  91% 93% 91%  Weight: 81.1 kg     Height:       General exam: awake, alert, no acute distress HEENT: moist mucus membranes, hearing grossly normal  Respiratory system: CTAB generally diminished, no wheezes, normal respiratory effort at rest on 4 L/min [place] O2. Cardiovascular system: normal S1/S2, RRR, no pedal edema.   Gastrointestinal system: soft, NT, ND, no HSM felt, +bowel sounds. Central nervous system: A&O x 4. no gross focal neurologic deficits, normal speech Extremities: moves all , no edema, normal tone Psychiatry: normal mood, flat affect     Data Reviewed:  Notable labs -- BMP normal except BUN 29, Ca 8.7, WBC 10.8, Hbg 10.0  Chest xray repeat -- pending from this AM -- follow    Family Communication: None present. No new medical updates to provide at this time.  Clinical status is unchanged in past several days.  Stable on 4 L oxygen.   Disposition: Status is: Inpatient Remains inpatient appropriate because: weaning down oxygen. Needs SNF placement.   Planned Discharge Destination: return to prior group home    Time spent: 35 minutes   Author: Pennie Banter, DO 08/03/2023 1:23 PM  For on call review [email or web address].

## 2023-08-03 NOTE — Plan of Care (Signed)

## 2023-08-04 DIAGNOSIS — J9601 Acute respiratory failure with hypoxia: Secondary | ICD-10-CM | POA: Diagnosis not present

## 2023-08-04 MED ORDER — LISINOPRIL 5 MG PO TABS
5.0000 mg | ORAL_TABLET | Freq: Every morning | ORAL | Status: DC
  Administered 2023-08-04: 5 mg via ORAL
  Filled 2023-08-04: qty 1

## 2023-08-04 MED ORDER — LISINOPRIL 5 MG PO TABS
5.0000 mg | ORAL_TABLET | Freq: Every day | ORAL | Status: DC
  Administered 2023-08-05 – 2023-08-14 (×9): 5 mg via ORAL
  Filled 2023-08-04 (×9): qty 1

## 2023-08-04 NOTE — Progress Notes (Signed)
 Pt attempted to take HS meds crushed in applesauce, but started coughing and spit up all ensure and applesauce he had taken with first bite.  He stated "it went down the wrong pipe".  Oral suction set up.  Once pt recovered, he declined to try to take the rest of his HS meds.  Pt now resting comfortably. Hilton Sinclair BSN RN CMSRN 08/04/2023, 12:52 AM

## 2023-08-04 NOTE — Progress Notes (Signed)
 Progress Note   Patient: Paul Vaughan YNW:[id number] DOB: 30-Jul-1955 DOA: 07/28/2023     7 DOS: the patient was seen and examined on 08/04/2023   Brief hospital course: 68 y.o. male with medical history significant of dementia, CKD stage IIIa, COPD, CHF, who presents to the ED due to generalized weakness.   History obtained through chart review due to patient's underlying dementia. Caregiver previously at bedside noted patient has been experiencing increased generalized weakness with poor appetite and episodes of urinary continence with increasing altered mental status over the last few days.  No reported fevers, vomiting, shortness of breath, chest pain, diarrhea.   ED course: On arrival to the ED, patient was hypotensive at 86/53 with heart rate of 90.  He was saturating at 88% on room air and subsequently placed on 3 L.  He was afebrile at 98.6.  Initial workup notable for WBC 12.5, BUN 64, creatinine 2.27, AST 189, ALT 76, GFR of 31.  Urinalysis with no leukocytes, nitrites or bacteria.  Chest x-ray with chronic interstitial lung changes, however increased opacities compared to prior with possible right apical pneumonia.  CT of the abdomen with no acute findings.  Patient started on IV fluids, azithromycin, ceftriaxone, DuoNebs, and Solu-Medrol.  TRH contacted for admission.  3/17.  Continue IV antibiotics for sepsis and pneumonia, taper oxygen if able, IV fluids for rhabdomyolysis. 3/18.  Patient's respiratory status worsened requiring to be placed on 100% nonrebreather.  Suspect aspiration.  Chest x-ray done but pending results ABG showing a pO2 of 111 and a pH of 7.36 with a pCO2 of 43.  Patient admitted with sepsis and rhabdomyolysis.  CK down to 1195.  Transferred to stepdown unit.  Made NPO.  Changed Maxipime over to Unasyn.  I assumed care on 07/31/23.  3/19 -- respiratory status stabilized, oxygen sats stable on 4 L/min.  Stable to transfer to med/tele floor this afternoon.  3/20  -- on 4 L/min [place] O2, sats overall low 90's. 3/22 -- weaned to 3 L/min O2.  Stable for SNF discharge.   Assessment and Plan:  * Acute hypoxic respiratory failure (HCC) Patient with a pulse ox of 88% on room air on admission, due to sepsis from pneumonia.  3/18 developed acute worsening hypoxia after aspiration episode. Transferred to stepdown and pulmonology was consulted. 3/19--22: stable on 4 L/min [place] O2 with spO2 90-93% 3/23: weaned to 3 L/min --Pulmonology consulted - signed off --Repeat CXR this AM -- pending, follow --Supplement O2 per protocol --Target sats > 90% --Steroids -- Decadron taper per Pulm's orders --Mucomyst nebs --Mgmt of PNA as outlined below.  Severe sepsis  Present on admission with bilateral pneumonia, hypotension, leukocytosis, tachycardia elevated lactic acid and acute hypoxic respiratory failure and acute kidney injury.  Completed sepsis IVF.   Antibiotics as below.  Multifocal pneumonia --Continue Rocephin (day 5), completed Zithromax (day 5), Flagyl (day 5) --Supportive care per orders  Hypokalemia - K 2.9 on 3/20 was replaced. Mg is 2.0. --Today's BMP pending - follow and replace K as needed --BMP in AM  Acute kidney injury superimposed on CKD  AKI on CKD stage IIIa.   Creatinine 2.27 on presentation.  Normalized to baseline. Cr 0.86 (3/23) --Monitor BMP  Elevated LFTs - improved Likely secondary to sepsis  Chronic obstructive pulmonary disease, unspecified --Continue nebulizers and steroids as above --Pulmonology following  Rhabdomyolysis Hold Crestor.  No falls at home.  PT evaluation.   Resolved with IV fluids.  Chronic diastolic CHF Currently no signs  of heart failure.  Watch closely with gentle fluids.  Echo back in 2024 showed EF greater than 55%. 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 --Depakote, Lexapro, Seroquel  Essential (primary) hypertension Systolic BP's in 578'I to 150's.  BP meds have been on hold. --Resume lisinopril 5 mg daily --Add Lasix 20 mg PO daily given mild pedal edema as well       Subjective: Pt was awake sitting up in bed this AM. Reports he feels well. Denies complaints. No acute events reported.   Physical Exam: Vitals:   08/03/23 2054 08/04/23 0513 08/04/23 0714 08/04/23 0910  BP: (!) 148/82 (!) 154/82  (!) 160/94  Pulse: 91 89  85  Resp: 20 16  16   Temp: (!) 97.4 F (36.3 C) 99.4 F (37.4 C)  98.2 F (36.8 C)  TempSrc:      SpO2: 93% 91% 91% 92%  Weight:      Height:       General exam: awake, alert, no acute distress Respiratory system: CTAB mildly diminished but aeration improving, no wheezes, normal respiratory effort at rest on 3 L/min [place] O2. Cardiovascular system: normal S1/S2, RRR, no pedal edema.   Gastrointestinal system: soft, NT, ND Central nervous system: A&O x 4. no gross focal neurologic deficits, normal speech Extremities: moves all , no edema, normal tone Psychiatry: normal mood, flat affect     Data Reviewed:  No new labs today  Notable labs 3/22 -- BMP normal except BUN 29, Ca 8.7, WBC 10.8, Hbg 10.0    Family Communication: None present. No new medical updates to provide at this time.    Disposition: Status is: Inpatient Remains inpatient appropriate because: Stable for SNF placement.  Continuing to wean O2, but on 3 L/min and can d/c when bed available   Planned Discharge Destination: SNF rehab prior to return to group home    Time spent: 35 minutes   Author: Pennie Banter, DO 08/04/2023 12:36 PM  For on call review [email or web address].

## 2023-08-05 DIAGNOSIS — J9601 Acute respiratory failure with hypoxia: Secondary | ICD-10-CM | POA: Diagnosis not present

## 2023-08-05 LAB — BASIC METABOLIC PANEL
Anion gap: 7 (ref 5–15)
BUN: 37 mg/dL — ABNORMAL HIGH (ref 8–23)
CO2: 29 mmol/L (ref 22–32)
Calcium: 8.8 mg/dL — ABNORMAL LOW (ref 8.9–10.3)
Chloride: 100 mmol/L (ref 98–111)
Creatinine, Ser: 0.9 mg/dL (ref 0.61–1.24)
GFR, Estimated: >60 mL/min (ref >60)
Glucose, Bld: 96 mg/dL (ref 70–99)
Potassium: 3.8 mmol/L (ref 3.5–5.1)
Sodium: 136 mmol/L (ref 135–145)

## 2023-08-05 LAB — CBC
HCT: 29.8 % — ABNORMAL LOW (ref 39.0–52.0)
Hemoglobin: 10.5 g/dL — ABNORMAL LOW (ref 13.0–17.0)
MCH: 32.6 pg (ref 26.0–34.0)
MCHC: 35.2 g/dL (ref 30.0–36.0)
MCV: 92.5 fL (ref 80.0–100.0)
Platelets: 239 10*3/uL (ref 150–400)
RBC: 3.22 MIL/uL — ABNORMAL LOW (ref 4.22–5.81)
RDW: 15.9 % — ABNORMAL HIGH (ref 11.5–15.5)
WBC: 9.9 10*3/uL (ref 4.0–10.5)
nRBC: 0 % (ref 0.0–0.2)

## 2023-08-05 NOTE — Plan of Care (Signed)
  Problem: Fluid Volume: Goal: Hemodynamic stability will improve Outcome: Progressing   Problem: Respiratory: Goal: Ability to maintain adequate ventilation will improve Outcome: Progressing   

## 2023-08-05 NOTE — Progress Notes (Signed)
 Progress Note   Patient: Paul Vaughan ZOX:[id number] DOB: 04/20/1956 DOA: 07/28/2023     8 DOS: the patient was seen and examined on 08/05/2023   Brief hospital course: 68 y.o. male with medical history significant of dementia, CKD stage IIIa, COPD, CHF, who presents to the ED due to generalized weakness.   History obtained through chart review due to patient's underlying dementia. Caregiver previously at bedside noted patient has been experiencing increased generalized weakness with poor appetite and episodes of urinary continence with increasing altered mental status over the last few days.  No reported fevers, vomiting, shortness of breath, chest pain, diarrhea.   ED course: On arrival to the ED, patient was hypotensive at 86/53 with heart rate of 90.  He was saturating at 88% on room air and subsequently placed on 3 L.  He was afebrile at 98.6.  Initial workup notable for WBC 12.5, BUN 64, creatinine 2.27, AST 189, ALT 76, GFR of 31.  Urinalysis with no leukocytes, nitrites or bacteria.  Chest x-ray with chronic interstitial lung changes, however increased opacities compared to prior with possible right apical pneumonia.  CT of the abdomen with no acute findings.  Patient started on IV fluids, azithromycin, ceftriaxone, DuoNebs, and Solu-Medrol.  TRH contacted for admission.  3/17.  Continue IV antibiotics for sepsis and pneumonia, taper oxygen if able, IV fluids for rhabdomyolysis. 3/18.  Patient's respiratory status worsened requiring to be placed on 100% nonrebreather.  Suspect aspiration.  Chest x-ray done but pending results ABG showing a pO2 of 111 and a pH of 7.36 with a pCO2 of 43.  Patient admitted with sepsis and rhabdomyolysis.  CK down to 1195.  Transferred to stepdown unit.  Made NPO.  Changed Maxipime over to Unasyn.  I assumed care on 07/31/23.  3/19 -- respiratory status stabilized, oxygen sats stable on 4 L/min.  Stable to transfer to med/tele floor this afternoon.  3/20  -- on 4 L/min [place] O2, sats overall low 90's. 3/22 -- weaned to 3 L/min O2.  Stable for SNF discharge.   Assessment and Plan:  * Acute hypoxic respiratory failure (HCC) Patient with a pulse ox of 88% on room air on admission, due to sepsis from pneumonia.  3/18 developed acute worsening hypoxia after aspiration episode. Transferred to stepdown and pulmonology was consulted. 3/19--22: stable on 4 L/min [place] O2 with spO2 90-93% 3/23--24: weaned to 3 L/min --Pulmonology consulted - signed off --Repeat CXR this AM -- pending, follow --Supplement O2 per protocol --Target sats > 90% --Steroids -- Decadron taper per Pulm's orders --Mucomyst nebs --Mgmt of PNA as outlined below.  Severe sepsis  Present on admission with bilateral pneumonia, hypotension, leukocytosis, tachycardia elevated lactic acid and acute hypoxic respiratory failure and acute kidney injury.  Completed sepsis IVF.   Antibiotics as below.  Multifocal pneumonia --Completed antibiotics:  Rocephin (7 days), (5 days), Flagyl (7 days)   (2 days Cefepime on admission) --Supportive care per orders  Hypokalemia - K 2.9 on 3/20 was replaced. Mg is 2.0. --Today's BMP pending - follow and replace K as needed --BMP in AM  Acute kidney injury superimposed on CKD  AKI on CKD stage IIIa.   Creatinine 2.27 on presentation.  Normalized to baseline. Cr 0.86 (3/23) --Monitor BMP  Elevated LFTs - improved Likely secondary to sepsis  Chronic obstructive pulmonary disease, unspecified --Continue nebulizers and steroids as above --Pulmonology consulted, have signed off  Rhabdomyolysis Hold Crestor.  No falls at home.  PT evaluation.   Resolved  with IV fluids.  Chronic diastolic CHF Currently no signs of heart failure.  Watch closely with gentle fluids.  Echo back in 2024 showed EF greater than 55%. 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 --Depakote, Lexapro, Seroquel  Essential (primary) hypertension Systolic BP's in 161'W to 150's.  BP meds have been on hold. --Resumed lisinopril 5 mg daily --Added Lasix 20 mg PO daily given mild pedal edema as well       Subjective: Pt was awake sitting up in bed this AM. He denies any complaints including shortness of breath.   Physical Exam: Vitals:   08/05/23 0500 08/05/23 0507 08/05/23 0712 08/05/23 0716  BP:  (!) 106/59 118/72   Pulse:  90 94   Resp:  18 18   Temp:  98 F (36.7 C) 98.2 F (36.8 C)   TempSrc:  Oral Oral   SpO2:  94% 95% 92%  Weight: 78 kg     Height:       General exam: awake, alert, no acute distress Respiratory system: CTAB mildly diminished but aeration improving, no wheezes, normal respiratory effort at rest on 3 L/min [place] O2. Cardiovascular system: normal S1/S2, RRR, no pedal edema.   Gastrointestinal system: soft, NT, ND Central nervous system: A&O x 4. no gross focal neurologic deficits, normal speech Extremities: moves all , no edema, normal tone Psychiatry: normal mood, flat affect     Data Reviewed:  No new labs today  Notable labs 3/22 -- BMP normal except BUN 29, Ca 8.7, WBC 10.8, Hbg 10.0    Family Communication: None present. No new medical updates to provide at this time.    Disposition: Status is: Inpatient Remains inpatient appropriate because: Stable for SNF placement.  Continuing to wean O2, but on 3 L/min and can d/c when bed available   Planned Discharge Destination: SNF rehab prior to return to group home    Time spent: 35 minutes   Author: Pennie Banter, DO 08/05/2023 12:53 PM  For on call review [email or web address].

## 2023-08-05 NOTE — Progress Notes (Signed)
 Physical Therapy Treatment Patient Details Name: Paul Vaughan MRN: [id number] DOB: 1956-03-27 Today's Date: 08/05/2023   History of Present Illness Patient is a 68 year old male who presented to ED with weakness and limited ability to ambulate. Patient lives at [facility] and [facility]. PMH includes dementia, CKD stage IIIa, HTN. MD assessment includes: acute hypoxic respiratory failure due to sepsis from pneumonia, AKI, rhabdomyolysis, and hypokalemia.    PT Comments  Pt pleasantly confused but able to follow all 1-step commands with extra time and cuing as needed.  Pt required increased time and effort with bed mobility tasks and min A to come to standing from an elevated EOB with cues for proper sequencing.  Pt able to amb 2 x 4 feet with seated rest break between bouts with SpO2 WNL on 3L and HR up to the 120s with exertion from 90s at rest.  Pt will benefit from continued PT services upon discharge to safely address deficits listed in patient problem list for decreased caregiver assistance and eventual return to PLOF.     If plan is discharge home, recommend the following: A lot of help with walking and/or transfers;A little help with bathing/dressing/bathroom;Assistance with cooking/housework;Direct supervision/assist for medications management;Supervision due to cognitive status;Assist for transportation;Help with stairs or ramp for entrance   Can travel by private vehicle     Yes  Equipment Recommendations  Other (comment) (TBD)    Recommendations for Other Services       Precautions / Restrictions Precautions Precautions: Fall Recall of Precautions/Restrictions: Impaired Restrictions Weight Bearing Restrictions Per Provider Order: No     Mobility  Bed Mobility Overal bed mobility: Needs Assistance Bed Mobility: Supine to Sit     Supine to sit: Supervision     General bed mobility comments: Extra time, effort, and use of bed rail    Transfers Overall transfer  level: Needs assistance Equipment used: Rolling walker (2 wheels) Transfers: Sit to/from Stand Sit to Stand: Min assist, From elevated surface           General transfer comment: Min verbal cues for hand placement and increased trunk flexion    Ambulation/Gait Ambulation/Gait assistance: Contact guard assist Gait Distance (Feet): 4 Feet Assistive device: Rolling walker (2 wheels) Gait Pattern/deviations: Step-through pattern, Decreased step length - right, Decreased step length - left, Shuffle, Trunk flexed Gait velocity: decreased     General Gait Details: Pt able to amb with a RW with very slow, effortful, and often shuffling steps with CGA provided but no physical assist needed to prevent LOB   Stairs             Wheelchair Mobility     Tilt Bed    Modified Rankin (Stroke Patients Only)       Balance Overall balance assessment: Needs assistance Sitting-balance support: Feet supported, Single extremity supported Sitting balance-Leahy Scale: Good     Standing balance support: Bilateral upper extremity supported, Reliant on assistive device for balance, During functional activity Standing balance-Leahy Scale: Fair                              Hotel manager: Impaired  Cognition Arousal: Alert Behavior During Therapy: Flat affect   PT - Cognitive impairments: History of cognitive impairments                         Following commands: Intact  Cueing Cueing Techniques: Verbal cues, Gestural cues, Visual cues  Exercises Other Exercises Other Exercises: Multiple sit to/from stands from various height surfaces with training on proper sequencing    General Comments        Pertinent Vitals/Pain Pain Assessment Pain Assessment: No/denies pain    Home Living                          Prior Function            PT Goals (current goals can now be found in the care plan section)  Progress towards PT goals: Progressing toward goals    Frequency    Min 2X/week      PT Plan      Co-evaluation              AM-PAC PT "6 Clicks" Mobility   Outcome Measure  Help needed turning from your back to your side while in a flat bed without using bedrails?: A Little Help needed moving from lying on your back to sitting on the side of a flat bed without using bedrails?: A Little Help needed moving to and from a bed to a chair (including a wheelchair)?: A Little Help needed standing up from a chair using your arms (e.g., wheelchair or bedside chair)?: A Little Help needed to walk in hospital room?: A Lot Help needed climbing 3-5 steps with a railing? : Total 6 Click Score: 15    End of Session Equipment Utilized During Treatment: Oxygen;Gait belt Activity Tolerance: Patient tolerated treatment well Patient left: in chair;with chair alarm set;with call bell/phone within reach Nurse Communication: Mobility status PT Visit Diagnosis: Muscle weakness (generalized) (M62.81);Other abnormalities of gait and mobility (R26.89)     Time: 0981-1914 PT Time Calculation (min) (ACUTE ONLY): 31 min  Charges:    $Gait Training: 8-22 mins $Therapeutic Activity: 8-22 mins PT General Charges $$ ACUTE PT VISIT: 1 Visit                     D. Scott Tacey Dimaggio PT, DPT 08/05/23, 3:34 PM

## 2023-08-06 DIAGNOSIS — J9601 Acute respiratory failure with hypoxia: Secondary | ICD-10-CM | POA: Diagnosis not present

## 2023-08-06 NOTE — Progress Notes (Signed)
 The above named patient is recommended to go to Short Term Rehab for strengthening and gait training for balance.  It is expected that the Short Term Rehab stay will be less than 30 days.  The patient is expected to return home after Rehab.

## 2023-08-06 NOTE — NC FL2 (Addendum)
 [place] MEDICAID FL2 LEVEL OF CARE FORM     IDENTIFICATION  Patient Name: Paul Vaughan Birthdate: 1956-03-07 Sex: male Admission Date (Current Location): 07/28/2023  [facility] and [place] Number:  Chiropodist and Address:  [facility], [address] [place], [place] [address]      Provider Number: [id number]  Attending Physician Name and Address:  Pennie Banter, DO  Relative Name and Phone Number:  Jevonte Clanton Legal guardian DSS [phone]    Current Level of Care: Hospital Recommended Level of Care: Skilled Nursing Facility Prior Approval Number:    Date Approved/Denied:   PASRR Number: Pending  Discharge Plan: SNF    Current Diagnoses: Patient Active Problem List   Diagnosis Date Noted   Multifocal pneumonia 07/29/2023   Chronic diastolic CHF (congestive heart failure) (HCC) 07/29/2023   Rhabdomyolysis 07/29/2023   Acute hypoxic respiratory failure (HCC) 07/28/2023   Elevated LFTs 07/28/2023   Thrombocytopenia (HCC) 07/01/2021   Anemia of chronic disease 07/01/2021   Acute kidney injury superimposed on CKD (HCC) 07/01/2021   Severe sepsis (HCC) 06/30/2021   Acute hypoxemic respiratory failure due to COVID-19 [facility]) 06/30/2021   Chronic depression 06/08/2021   Chronic obstructive pulmonary disease, unspecified (HCC) 06/08/2021   Essential (primary) hypertension 06/08/2021   Dementia with behavioral disturbance (HCC) 06/08/2021   Proteinuria, unspecified 06/08/2021   Hyperlipidemia, unspecified 06/08/2021   Borderline intellectual disability 09/08/2015   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    Orientation RESPIRATION BLADDER Height & Weight     Self, Place  Normal, O2 (3 liters) Incontinent Weight: 79.3 kg Height:  5\' 8"  (172.7 cm)  BEHAVIORAL SYMPTOMS/MOOD NEUROLOGICAL BOWEL NUTRITION STATUS      Incontinent Diet (Dys 3)  AMBULATORY STATUS COMMUNICATION OF NEEDS  Skin   Extensive Assist Verbally Normal                       Personal Care Assistance Level of Assistance  Bathing, Feeding, Dressing Bathing Assistance: Limited assistance Feeding assistance: Limited assistance Dressing Assistance: Limited assistance     Functional Limitations Info  Sight, Speech, Hearing Sight Info: Adequate Hearing Info: Adequate Speech Info: Adequate    SPECIAL CARE FACTORS FREQUENCY  PT (By licensed PT), OT (By licensed OT)     PT Frequency: 5 times per week OT Frequency: 5 times per week            Contractures Contractures Info: Not present    Additional Factors Info  Code Status, Allergies Code Status Info: FULL Code Allergies Info: Penicillins, Sulfa Antibiotics           Current Medications (08/06/2023):  This is the current hospital active medication list Current Facility-Administered Medications  Medication Dose Route Frequency Provider Last Rate Last Admin   acetaminophen (TYLENOL) tablet 650 mg  650 mg Oral Q6H PRN Verdene Lennert, MD       Or   acetaminophen (TYLENOL) suppository 650 mg  650 mg Rectal Q6H PRN Verdene Lennert, MD       albuterol (PROVENTIL) (2.5 MG/3ML) 0.083% nebulizer solution 2.5 mg  2.5 mg Nebulization Q4H PRN Esaw Grandchild A, DO       aspirin EC tablet 81 mg  81 mg Oral Daily Verdene Lennert, MD   81 mg at 08/06/23 0853   [START ON 08/07/2023] dexamethasone (DECADRON) injection 3 mg  3 mg Intravenous Once [place], [place] H, RPH       Followed by   [  START ON 08/08/2023] dexamethasone (DECADRON) injection 2 mg  2 mg Intravenous Once [place], [place] H, RPH       Followed by   Melene Muller ON 08/09/2023] dexamethasone (DECADRON) injection 1 mg  1 mg Intravenous Once [place], [place] H, RPH       divalproex (DEPAKOTE ER) 24 hr tablet 500 mg  500 mg Oral Daily Jimmye Norman, NP   500 mg at 08/06/23 0630   And   divalproex (DEPAKOTE ER) 24 hr tablet 1,000 mg  1,000 mg Oral QHS Jimmye Norman, NP   1,000 mg  at 08/05/23 2108   enoxaparin (LOVENOX) injection 40 mg  40 mg Subcutaneous Q24H Sanjit, Mcmichael, RPH   40 mg at 08/05/23 2110   escitalopram (LEXAPRO) tablet 5 mg  5 mg Oral QHS Verdene Lennert, MD   5 mg at 08/05/23 2108   feeding supplement (ENSURE ENLIVE / ENSURE PLUS) liquid 237 mL  237 mL Oral TID BM Esaw Grandchild A, DO   237 mL at 08/06/23 0847   fluticasone (FLONASE) 50 MCG/ACT nasal spray 2 spray  2 spray Each Nare Daily Alford Highland, MD   2 spray at 08/06/23 0853   folic acid (FOLVITE) tablet 1 mg  1 mg Oral Q1200 Verdene Lennert, MD   1 mg at 08/06/23 1211   gabapentin (NEURONTIN) capsule 100 mg  100 mg Oral TID Verdene Lennert, MD   100 mg at 08/06/23 0853   ipratropium-albuterol (DUONEB) 0.5-2.5 (3) MG/3ML nebulizer solution 3 mL  3 mL Nebulization TID Esaw Grandchild A, DO   3 mL at 08/06/23 1332   lisinopril (ZESTRIL) tablet 5 mg  5 mg Oral Daily Esaw Grandchild A, DO   5 mg at 08/06/23 0853   metroNIDAZOLE (FLAGYL) IVPB 500 mg  500 mg Intravenous Q8H Nazari, Walid A, RPH 100 mL/hr at 08/06/23 0850 500 mg at 08/06/23 0850   multivitamin with minerals tablet 1 tablet  1 tablet Oral Daily Esaw Grandchild A, DO   1 tablet at 08/06/23 0853   ondansetron (ZOFRAN) tablet 4 mg  4 mg Oral Q6H PRN Verdene Lennert, MD       Or   ondansetron (ZOFRAN) injection 4 mg  4 mg Intravenous Q6H PRN Verdene Lennert, MD       polyethylene glycol (MIRALAX / GLYCOLAX) packet 17 g  17 g Oral Daily PRN Verdene Lennert, MD       pyridOXINE (VITAMIN B6) tablet 100 mg  100 mg Oral QHS Verdene Lennert, MD   100 mg at 08/05/23 2107   QUEtiapine (SEROQUEL) tablet 150 mg  150 mg Oral QHS Verdene Lennert, MD   150 mg at 08/05/23 2107   QUEtiapine (SEROQUEL) tablet 50 mg  50 mg Oral Daily Paulita Fujita H, RPH   50 mg at 08/06/23 1601   And   QUEtiapine (SEROQUEL) tablet 50 mg  50 mg Oral Q1400 Hunt, Madison H, RPH   50 mg at 08/06/23 1210   sodium chloride flush (NS) 0.9 % injection 3 mL  3 mL Intravenous  Q12H Verdene Lennert, MD   3 mL at 08/05/23 2110   thiamine (VITAMIN B1) tablet 100 mg  100 mg Oral Q1200 Verdene Lennert, MD   100 mg at 08/06/23 1211     Discharge Medications: Please see discharge summary for a list of discharge medications.  Relevant Imaging Results:  Relevant Lab Results:   Additional Information SSN:[id number]  Marlowe Sax, RN

## 2023-08-06 NOTE — Plan of Care (Signed)
  Problem: Clinical Measurements: Goal: Will remain free from infection Outcome: Progressing   Problem: Respiratory: Goal: Ability to maintain adequate ventilation will improve Outcome: Not Progressing

## 2023-08-06 NOTE — Progress Notes (Signed)
 Progress Note   Patient: Paul Vaughan GNF:[id number] DOB: 09-06-1955 DOA: 07/28/2023     9 DOS: the patient was seen and examined on 08/06/2023   Brief hospital course: 68 y.o. male with medical history significant of dementia, CKD stage IIIa, COPD, CHF, who presents to the ED due to generalized weakness.   History obtained through chart review due to patient's underlying dementia. Caregiver previously at bedside noted patient has been experiencing increased generalized weakness with poor appetite and episodes of urinary continence with increasing altered mental status over the last few days.  No reported fevers, vomiting, shortness of breath, chest pain, diarrhea.   ED course: On arrival to the ED, patient was hypotensive at 86/53 with heart rate of 90.  He was saturating at 88% on room air and subsequently placed on 3 L.  He was afebrile at 98.6.  Initial workup notable for WBC 12.5, BUN 64, creatinine 2.27, AST 189, ALT 76, GFR of 31.  Urinalysis with no leukocytes, nitrites or bacteria.  Chest x-ray with chronic interstitial lung changes, however increased opacities compared to prior with possible right apical pneumonia.  CT of the abdomen with no acute findings.  Patient started on IV fluids, azithromycin, ceftriaxone, DuoNebs, and Solu-Medrol.  TRH contacted for admission.  3/17.  Continue IV antibiotics for sepsis and pneumonia, taper oxygen if able, IV fluids for rhabdomyolysis. 3/18.  Patient's respiratory status worsened requiring to be placed on 100% nonrebreather.  Suspect aspiration.  Chest x-ray done but pending results ABG showing a pO2 of 111 and a pH of 7.36 with a pCO2 of 43.  Patient admitted with sepsis and rhabdomyolysis.  CK down to 1195.  Transferred to stepdown unit.  Made NPO.  Changed Maxipime over to Unasyn.  I assumed care on 07/31/23.  3/19 -- respiratory status stabilized, oxygen sats stable on 4 L/min.  Stable to transfer to med/tele floor this afternoon.  3/20  -- on 4 L/min [place] O2, sats overall low 90's. 3/22 -- weaned to 3 L/min O2.  Stable for SNF discharge.   Assessment and Plan:  * Acute hypoxic respiratory failure (HCC) Patient with a pulse ox of 88% on room air on admission, due to sepsis from pneumonia.  3/18 developed acute worsening hypoxia after aspiration episode. Transferred to stepdown and pulmonology was consulted. 3/19--22: stable on 4 L/min [place] O2 with spO2 90-93% 3/23--25: weaned to 3 L/min --Pulmonology consulted - signed off --Supplement O2 per protocol --Target sats > 90% --Steroids -- on Decadron taper per Pulm's orders --Mucomyst nebs --Mgmt of PNA as outlined below.  Severe sepsis  Present on admission with bilateral pneumonia, hypotension, leukocytosis, tachycardia elevated lactic acid and acute hypoxic respiratory failure and acute kidney injury.  Completed sepsis IVF.   Antibiotics as below.  Multifocal pneumonia --Completed antibiotics:  Rocephin (7 days), (5 days), Flagyl (7 days)   (2 days Cefepime on admission) --Supportive care per orders  Hypokalemia - K 2.9 on 3/20 was replaced. Mg is 2.0. --Today's BMP pending - follow and replace K as needed --BMP in AM  Acute kidney injury superimposed on CKD  AKI on CKD stage IIIa.   Creatinine 2.27 on presentation.  Normalized to baseline. Cr 0.86 (3/23) --Monitor BMP  Elevated LFTs - improved Likely secondary to sepsis  Chronic obstructive pulmonary disease, unspecified --Continue nebulizers and steroids as above --Pulmonology consulted, have signed off  Rhabdomyolysis Hold Crestor.  No falls at home.  PT evaluation.   Resolved with IV fluids.  Chronic diastolic  CHF Currently no signs of heart failure.  Watch closely with gentle fluids.  Echo back in 2024 showed EF greater than 55%. 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 --Depakote, Lexapro, Seroquel  Essential (primary) hypertension Systolic BP's in 409'W to 150's.  BP meds have been on hold. --Resumed lisinopril 5 mg daily --Added Lasix 20 mg PO daily given mild pedal edema as well       Subjective: Pt was awake sitting up in bed this AM. No acute complaints. States feeling well.    Physical Exam: Vitals:   08/06/23 0500 08/06/23 0521 08/06/23 0716 08/06/23 0743  BP:  117/74  126/74  Pulse:  90  77  Resp:  18  20  Temp:  98 F (36.7 C)  98.1 F (36.7 C)  TempSrc:  Oral  Oral  SpO2:  95% 92% 90%  Weight: 79.3 kg     Height:       General exam: awake, alert, no acute distress Respiratory system: CTAB, no wheezes, rales or rhonchi, normal respiratory effort. Cardiovascular system: normal S1/S2, RRR  Gastrointestinal system: soft, NT, ND Central nervous system: no gross focal neurologic deficits, normal speech Extremities: trace pedal edema, normal tone Skin: dry, intact, normal temperature       Data Reviewed:  No new labs today  Notable labs 3/22 -- BMP normal except BUN 29, Ca 8.7, WBC 10.8, Hbg 10.0    Family Communication: None present. No new medical updates to provide at this time.    Disposition: Status is: Inpatient Remains inpatient appropriate because: Stable for SNF placement.  Continuing to wean O2, but on 3 L/min and can d/c when bed available   Planned Discharge Destination: SNF rehab prior to return to group home    Time spent: 35 minutes   Author: Pennie Banter, DO 08/06/2023 12:14 PM  For on call review [email or web address].

## 2023-08-06 NOTE — TOC Progression Note (Signed)
 Transition of Care [facility]) - Progression Note    Patient Details  Name: Paul Vaughan MRN: [id number] Date of Birth: July 22, 1955  Transition [facility] Care [facility]) CM/SW Contact  Marlowe Sax, RN Phone Number: 08/06/2023, 4:28 PM  Clinical Narrative:    Bed search done, fl2 done, PASSR pending, Called Avenir Lozinski at [facility] legal guardian [phone] left a vm asking for a call back   Expected Discharge Plan:  (TBD) Barriers to Discharge: No Barriers Identified  Expected Discharge Plan and Services   Discharge Planning Services: CM Consult   Living arrangements for the past 2 months: Group Home (Patient from [place] and [facility].)                                       Social Determinants of Health (SDOH) Interventions SDOH Screenings   Food Insecurity: No Food Insecurity (07/29/2023)  Housing: Low Risk  (07/29/2023)  Transportation Needs: No Transportation Needs (07/29/2023)  Utilities: Not At Risk (07/29/2023)  Social Connections: Unknown (07/29/2023)  Tobacco Use: High Risk (07/29/2023)    Readmission Risk Interventions    07/01/2021    1:02 PM  Readmission Risk Prevention Plan  Transportation Screening Complete  PCP or Specialist Appt within 5-7 Days Complete  Home Care Screening Complete  Medication Review (RN CM) Complete

## 2023-08-06 NOTE — Plan of Care (Signed)
  Problem: Clinical Measurements: Goal: Diagnostic test results will improve Outcome: Progressing Goal: Signs and symptoms of infection will decrease Outcome: Progressing   Problem: Respiratory: Goal: Ability to maintain adequate ventilation will improve Outcome: Progressing   Problem: Clinical Measurements: Goal: Diagnostic test results will improve Outcome: Progressing   Problem: Nutrition: Goal: Adequate nutrition will be maintained Outcome: Progressing   Problem: Coping: Goal: Level of anxiety will decrease Outcome: Progressing   Problem: Safety: Goal: Ability to remain free from injury will improve Outcome: Progressing

## 2023-08-06 NOTE — Progress Notes (Signed)
 Physical Therapy Treatment Patient Details Name: Paul Vaughan MRN: [id number] DOB: [birth year] Today's Date: 08/06/2023   History of Present Illness Patient is a 68 year old male who presented to ED with weakness and limited ability to ambulate. Patient lives at [facility] and [facility]. PMH includes dementia, CKD stage IIIa, HTN. MD assessment includes: acute hypoxic respiratory failure due to sepsis from pneumonia, AKI, rhabdomyolysis, and hypokalemia.    PT Comments  Pt was pleasantly confused but motivated to participate during the session and put forth good effort throughout.  Pt found having moved his  out of his nose with SpO2 94-95%.  Per nursing ok for trial on room air.  SpO2 on room air at rest = 94%, SpO2 on room air while ambulating = 82%, SpO2 on 3L liters of O2 while ambulating = 94%.  Pt required min A to come to standing without UE assist but with cues for hand positioning on armrests was able to stand with CGA only.  Pt able to amb a max of around 5 feet before feeling fatigued and requesting to return to sitting but with no adverse symptoms noted.  Pt will benefit from continued PT services upon discharge to safely address deficits listed in patient problem list for decreased caregiver assistance and eventual return to PLOF.      If plan is discharge home, recommend the following: A lot of help with walking and/or transfers;A little help with bathing/dressing/bathroom;Assistance with cooking/housework;Direct supervision/assist for medications management;Supervision due to cognitive status;Assist for transportation;Help with stairs or ramp for entrance   Can travel by private vehicle     Yes  Equipment Recommendations  Other (comment) (TBD)    Recommendations for Other Services       Precautions / Restrictions Precautions Precautions: Fall Recall of Precautions/Restrictions: Impaired Restrictions Weight Bearing Restrictions Per Provider Order: No     Mobility   Bed Mobility               General bed mobility comments: NT, pt in recliner pre-post session    Transfers Overall transfer level: Needs assistance Equipment used: Rolling walker (2 wheels) Transfers: Sit to/from Stand Sit to Stand: Min assist           General transfer comment: Mod verbal and tactile cues for hand placement and increased trunk flexion    Ambulation/Gait Ambulation/Gait assistance: Contact guard assist Gait Distance (Feet): 5 Feet Assistive device: Rolling walker (2 wheels) Gait Pattern/deviations: Step-through pattern, Decreased step length - right, Decreased step length - left, Shuffle, Trunk flexed Gait velocity: decreased     General Gait Details: Pt able to amb with a RW with very slow, effortful, and often shuffling steps with CGA provided but no physical assist needed to prevent LOB   Stairs             Wheelchair Mobility     Tilt Bed    Modified Rankin (Stroke Patients Only)       Balance Overall balance assessment: Needs assistance Sitting-balance support: Feet supported, Single extremity supported Sitting balance-Leahy Scale: Good     Standing balance support: Bilateral upper extremity supported, Reliant on assistive device for balance, During functional activity Standing balance-Leahy Scale: Fair                              Musician Communication: Impaired Factors Affecting Communication: Reduced clarity of speech  Cognition Arousal: Alert Behavior During Therapy: Flat affect  PT - Cognitive impairments: History of cognitive impairments                         Following commands: Intact      Cueing Cueing Techniques: Verbal cues, Gestural cues, Visual cues  Exercises Total Joint Exercises Ankle Circles/Pumps: AROM, Strengthening, Both, 10 reps Towel Squeeze: Strengthening, Both, 10 reps Hip ABduction/ADduction: AAROM, Strengthening, Both, 5 reps Straight Leg  Raises: AAROM, Strengthening, Both, 5 reps Long Arc Quad: AROM, Strengthening, Both, 10 reps Knee Flexion: AROM, Strengthening, Both, 10 reps Marching in Standing: AROM, Strengthening, Both, 10 reps, Seated    General Comments General comments (skin integrity, edema, etc.): 95% on 3L [place]      Pertinent Vitals/Pain Pain Assessment Pain Assessment: No/denies pain    Home Living                          Prior Function            PT Goals (current goals can now be found in the care plan section) Progress towards PT goals: Progressing toward goals    Frequency    Min 2X/week      PT Plan      Co-evaluation              AM-PAC PT "6 Clicks" Mobility   Outcome Measure  Help needed turning from your back to your side while in a flat bed without using bedrails?: A Little Help needed moving from lying on your back to sitting on the side of a flat bed without using bedrails?: A Little Help needed moving to and from a bed to a chair (including a wheelchair)?: A Little Help needed standing up from a chair using your arms (e.g., wheelchair or bedside chair)?: A Little Help needed to walk in hospital room?: A Lot Help needed climbing 3-5 steps with a railing? : Total 6 Click Score: 15    End of Session Equipment Utilized During Treatment: Oxygen;Gait belt Activity Tolerance: Patient tolerated treatment well Patient left: in chair;with chair alarm set;with call bell/phone within reach Nurse Communication: Mobility status;Other (comment) (results of trial on room air per above) PT Visit Diagnosis: Muscle weakness (generalized) (M62.81);Other abnormalities of gait and mobility (R26.89)     Time: 6606-3016 PT Time Calculation (min) (ACUTE ONLY): 22 min  Charges:    $Therapeutic Exercise: 8-22 mins PT General Charges $$ ACUTE PT VISIT: 1 Visit                    D. Elly Modena PT, DPT 08/06/23, 4:25 PM

## 2023-08-06 NOTE — Progress Notes (Signed)
 Occupational Therapy Treatment Patient Details Name: Paul Vaughan MRN: [id number] DOB: [birth year] Today's Date: 08/06/2023   History of present illness Patient is a 68 year old male who presented to ED with weakness and limited ability to ambulate. Patient lives at [facility] and [facility]. PMH includes dementia, CKD stage IIIa, HTN. MD assessment includes: acute hypoxic respiratory failure due to sepsis from pneumonia, AKI, rhabdomyolysis, and hypokalemia.   OT comments  Paul Vaughan was seen for OT treatment on this date. Upon arrival to room pt in bed, eager and agreeable to tx. Pt requires no assist for bed mobility. Upon sitting noted BM. MOD A + RW sit<>stand and bed>chair t/f. MAX A pericare standing. MIN A + cues don/doff gown in sitting. Increased WOB noted however SpO2 95% on 3L  with activity.  Left in chair with PT arrived to room. Pt making good progress toward goals, will continue to follow POC. Discharge recommendation remains appropriate.        If plan is discharge home, recommend the following:  Assistance with cooking/housework;Assist for transportation;Direct supervision/assist for medications management;Direct supervision/assist for financial management;Supervision due to cognitive status;Help with stairs or ramp for entrance;A lot of help with walking and/or transfers;A lot of help with bathing/dressing/bathroom   Equipment Recommendations  Other (comment) (2WW)    Recommendations for Other Services      Precautions / Restrictions Precautions Precautions: Fall Recall of Precautions/Restrictions: Impaired Restrictions Weight Bearing Restrictions Per Provider Order: No       Mobility Bed Mobility Overal bed mobility: Needs Assistance Bed Mobility: Supine to Sit     Supine to sit: Supervision          Transfers Overall transfer level: Needs assistance Equipment used: Rolling walker (2 wheels) Transfers: Sit to/from Stand, Bed to  chair/wheelchair/BSC Sit to Stand: Mod assist     Step pivot transfers: Mod assist           Balance Overall balance assessment: Needs assistance Sitting-balance support: Feet supported, Single extremity supported Sitting balance-Leahy Scale: Good     Standing balance support: Bilateral upper extremity supported, Reliant on assistive device for balance, During functional activity Standing balance-Leahy Scale: Poor Standing balance comment: posterior lean                           ADL either performed or assessed with clinical judgement   ADL Overall ADL's : Needs assistance/impaired                                       General ADL Comments: MOD A + RW for simulated BSC t/f. MAX A pericare standing. MIN A + cues don/doff gown in sitting.      Communication Communication Communication: Impaired Factors Affecting Communication: Reduced clarity of speech   Cognition Arousal: Alert Behavior During Therapy: Flat affect Cognition: No family/caregiver present to determine baseline                               Following commands: Intact        Cueing   Cueing Techniques: Verbal cues, Gestural cues, Visual cues        General Comments 95% on 3L     Pertinent Vitals/ Pain       Pain Assessment Pain Assessment: No/denies pain   Frequency  Min 2X/week        Progress Toward Goals  OT Goals(current goals can now be found in the care plan section)  Progress towards OT goals: Progressing toward goals  Acute Rehab OT Goals OT Goal Formulation: With patient Time For Goal Achievement: 08/16/23 Potential to Achieve Goals: Fair ADL Goals Pt Will Perform Lower Body Dressing: with min assist;sit to/from stand;sitting/lateral leans Pt Will Transfer to Toilet: with min assist;bedside commode Pt Will Perform Toileting - Clothing Manipulation and hygiene: sitting/lateral leans;with supervision Additional ADL Goal #1: Pt will  utilize learned ECS to support ADL participation and safety, PRN VC from staff, 3/3 opportunities  Plan      Co-evaluation                 AM-PAC OT "6 Clicks" Daily Activity     Outcome Measure   Help from another person eating meals?: None Help from another person taking care of personal grooming?: A Little Help from another person toileting, which includes using toliet, bedpan, or urinal?: A Lot Help from another person bathing (including washing, rinsing, drying)?: A Lot Help from another person to put on and taking off regular upper body clothing?: A Little Help from another person to put on and taking off regular lower body clothing?: A Lot 6 Click Score: 16    End of Session Equipment Utilized During Treatment: Gait belt;Oxygen;Rolling walker (2 wheels)  OT Visit Diagnosis: Other abnormalities of gait and mobility (R26.89);Repeated falls (R29.6);Muscle weakness (generalized) (M62.81)   Activity Tolerance Patient tolerated treatment well   Patient Left in chair;with call bell/phone within reach (PT in room)   Nurse Communication Mobility status        Time: 1610-9604 OT Time Calculation (min): 11 min  Charges: OT General Charges $OT Visit: 1 Visit OT Treatments $Self Care/Home Management : 8-22 mins  Kathie Dike, M.S. OTR/L  08/06/23, 4:11 PM  ascom [phone]

## 2023-08-06 NOTE — Progress Notes (Addendum)
 The above named patient is recommended to go to Short Term Rehab for strengthening and gait training for balance.  It is expected that the Short Term Rehab stay will be less than 30 days.  The patient is expected to return home after Rehab.

## 2023-08-07 DIAGNOSIS — J9601 Acute respiratory failure with hypoxia: Secondary | ICD-10-CM | POA: Diagnosis not present

## 2023-08-07 NOTE — TOC Progression Note (Signed)
 Transition of Care [facility]) - Progression Note    Patient Details  Name: Paul Vaughan MRN: [id number] Date of Birth: 1956-04-17  [facility] CM/SW Contact  Marlowe Sax, RN Phone Number: 08/07/2023, 11:56 AM  Clinical Narrative:    Attempted to reach DSS [place] legal guardian to review bed offers, left a VM for Maryan Rued at DSS asking for a call back, sent a secure Email to DSS asking for a call   Expected Discharge Plan:  (TBD) Barriers to Discharge: No Barriers Identified  Expected Discharge Plan and Services   Discharge Planning Services: CM Consult   Living arrangements for the past 2 months: Group Home (Patient from [place] and [facility].)                                       Social Determinants of Health (SDOH) Interventions SDOH Screenings   Food Insecurity: No Food Insecurity (07/29/2023)  Housing: Low Risk  (07/29/2023)  Transportation Needs: No Transportation Needs (07/29/2023)  Utilities: Not At Risk (07/29/2023)  Social Connections: Unknown (07/29/2023)  Tobacco Use: High Risk (07/29/2023)    Readmission Risk Interventions    07/01/2021    1:02 PM  Readmission Risk Prevention Plan  Transportation Screening Complete  PCP or Specialist Appt within 5-7 Days Complete  Home Care Screening Complete  Medication Review (RN CM) Complete

## 2023-08-07 NOTE — Plan of Care (Signed)
  Problem: Fluid Volume: Goal: Hemodynamic stability will improve Outcome: Progressing   Problem: Respiratory: Goal: Ability to maintain adequate ventilation will improve Outcome: Progressing   Problem: Clinical Measurements: Goal: Diagnostic test results will improve Outcome: Progressing   Problem: Activity: Goal: Risk for activity intolerance will decrease Outcome: Progressing   Problem: Nutrition: Goal: Adequate nutrition will be maintained Outcome: Progressing   Problem: Skin Integrity: Goal: Risk for impaired skin integrity will decrease Outcome: Progressing

## 2023-08-07 NOTE — Progress Notes (Signed)
 Occupational Therapy Treatment Patient Details Name: Paul Vaughan MRN: [id number] DOB: 1956/01/24 Today's Date: 08/07/2023   History of present illness Patient is a 68 year old male who presented to ED with weakness and limited ability to ambulate. Patient lives at [facility] and [facility]. PMH includes dementia, CKD stage IIIa, HTN. MD assessment includes: acute hypoxic respiratory failure due to sepsis from pneumonia, AKI, rhabdomyolysis, and hypokalemia.   OT comments  Pt seen for OT tx this date; session focuses on functional transfer training and education. Pt received on recliner on 2L via [place], noted to have increased WOB/SOB upon exertion with O2 sats dropping to 86%, requiring rest break and breathing techniques to achieve +90%. Pt requires MOD A to rise from recliner, max multimodal cuing for hand placement and is unable to achieve full standing, requiring MOD A to control descent back to recliner. OT reinforces technique with visual demonstration, and pt able to return demonstrate with max multimodal cuing and MIN A to rise using RW. Standing marches in place x 20, and pt requires ~2 min rest break before completing additional reps of STS transfers. Pt progresses to CGA with last attempt. Left pt upright in recliner, needs in reach and alarm activated. Discharge recommendation appropriate, OT will continue to follow.       If plan is discharge home, recommend the following:  Assistance with cooking/housework;Assist for transportation;Direct supervision/assist for medications management;Direct supervision/assist for financial management;Supervision due to cognitive status;Help with stairs or ramp for entrance;A lot of help with walking and/or transfers;A lot of help with bathing/dressing/bathroom   Equipment Recommendations  Other (comment) (RW)       Precautions / Restrictions Precautions Precautions: Fall Recall of Precautions/Restrictions: Impaired Restrictions Weight Bearing  Restrictions Per Provider Order: No       Mobility Bed Mobility               General bed mobility comments: NT, pt in recliner pre-post session    Transfers Overall transfer level: Needs assistance Equipment used: Rolling walker (2 wheels) Transfers: Sit to/from Stand Sit to Stand: Min assist, Mod assist           General transfer comment: pt with noted decreased problem solving. requires max multimodal cuing for hand placement and sequencing     Balance Overall balance assessment: Needs assistance Sitting-balance support: Feet supported, Single extremity supported Sitting balance-Leahy Scale: Good     Standing balance support: Bilateral upper extremity supported, Reliant on assistive device for balance, During functional activity Standing balance-Leahy Scale: Fair Standing balance comment: initally minA for standing balance, pt with posterior LOB, requiring modA to descent safely in recliner, progressing to CGA                           ADL either performed or assessed with clinical judgement   ADL Overall ADL's : Needs assistance/impaired                                       General ADL Comments: Pt politely declines ADL performance this date; session focused on functional STS traning     Communication Communication Communication: Impaired Factors Affecting Communication: Reduced clarity of speech   Cognition Arousal: Alert Behavior During Therapy: Flat affect Cognition: No family/caregiver present to determine baseline, Cognition impaired           Executive functioning impairment (select  all impairments): Problem solving, Reasoning, Sequencing                   Following commands: Impaired Following commands impaired: Follows one step commands inconsistently      Cueing   Cueing Techniques: Verbal cues, Gestural cues, Visual cues  Exercises Exercises: Other exercises Total Joint Exercises Marching in  Standing: AROM, Strengthening, Both, Standing, 20 reps Other Exercises Other Exercises: 10x reps STS from recliner       General Comments 2L/min O2, increased WOB/SOB with sats dropping to 85% during activity, 3 mins of PLB and rest to increase to 95%    Pertinent Vitals/ Pain       Pain Assessment Pain Assessment: No/denies pain         Frequency  Min 2X/week        Progress Toward Goals  OT Goals(current goals can now be found in the care plan section)  Progress towards OT goals: Progressing toward goals  Acute Rehab OT Goals OT Goal Formulation: With patient Time For Goal Achievement: 08/16/23 Potential to Achieve Goals: Fair ADL Goals Pt Will Perform Lower Body Dressing: with min assist;sit to/from stand;sitting/lateral leans Pt Will Transfer to Toilet: with min assist;bedside commode Pt Will Perform Toileting - Clothing Manipulation and hygiene: sitting/lateral leans;with supervision Additional ADL Goal #1: Pt will utilize learned ECS to support ADL participation and safety, PRN VC from staff, 3/3 opportunities  Plan      Co-evaluation                 AM-PAC OT "6 Clicks" Daily Activity     Outcome Measure   Help from another person eating meals?: None Help from another person taking care of personal grooming?: A Little Help from another person toileting, which includes using toliet, bedpan, or urinal?: A Lot Help from another person bathing (including washing, rinsing, drying)?: A Lot Help from another person to put on and taking off regular upper body clothing?: A Little Help from another person to put on and taking off regular lower body clothing?: A Lot 6 Click Score: 16    End of Session Equipment Utilized During Treatment: Gait belt;Oxygen;Rolling walker (2 wheels)  OT Visit Diagnosis: Other abnormalities of gait and mobility (R26.89);Repeated falls (R29.6);Muscle weakness (generalized) (M62.81)   Activity Tolerance Patient tolerated  treatment well   Patient Left in chair;with call bell/phone within reach;with chair alarm set   Nurse Communication Mobility status        Time: 1115-1130 OT Time Calculation (min): 15 min  Charges: OT General Charges $OT Visit: 1 Visit OT Treatments $Self Care/Home Management : 8-22 mins Johnnell Liou L. Fabio Wah, OTR/L  08/07/23, 1:01 PM

## 2023-08-07 NOTE — Progress Notes (Signed)
 Progress Note   Patient: Paul Vaughan ZOX:[id number] DOB: 21-Nov-1955 DOA: 07/28/2023     10 DOS: the patient was seen and examined on 08/07/2023     Brief hospital course: From HPI "68 y.o. male with medical history significant of dementia, CKD stage IIIa, COPD, CHF, who presents to the ED due to generalized weakness.   History obtained through chart review due to patient's underlying dementia. Caregiver previously at bedside noted patient has been experiencing increased generalized weakness with poor appetite and episodes of urinary continence with increasing altered mental status over the last few days.  No reported fevers, vomiting, shortness of breath, chest pain, diarrhea.   ED course: On arrival to the ED, patient was hypotensive at 86/53 with heart rate of 90.  He was saturating at 88% on room air and subsequently placed on 3 L.  He was afebrile at 98.6.  Initial workup notable for WBC 12.5, BUN 64, creatinine 2.27, AST 189, ALT 76, GFR of 31.  Urinalysis with no leukocytes, nitrites or bacteria.  Chest x-ray with chronic interstitial lung changes, however increased opacities compared to prior with possible right apical pneumonia.  CT of the abdomen with no acute findings.  Patient started on IV fluids, azithromycin, ceftriaxone, DuoNebs, and Solu-Medrol.  TRH contacted for admission.  "  Hospital course as outlined below   3/17.  Continue IV antibiotics for sepsis and pneumonia, taper oxygen if able, IV fluids for rhabdomyolysis. 3/18.  Patient's respiratory status worsened requiring to be placed on 100% nonrebreather.  Suspect aspiration.  Chest x-ray done but pending results ABG showing a pO2 of 111 and a pH of 7.36 with a pCO2 of 43.  Patient admitted with sepsis and rhabdomyolysis.  CK down to 1195.  Transferred to stepdown unit.  Made NPO.  Changed Maxipime over to Unasyn.   I assumed care on 07/31/23.   3/19 -- respiratory status stabilized, oxygen sats stable on 4 L/min.   Stable to transfer to med/tele floor this afternoon.   3/20 -- on 4 L/min [place] O2, sats overall low 90's. 3/22 -- weaned to 3 L/min O2.  Stable for SNF discharge.     Assessment and Plan:   * Acute hypoxic respiratory failure (HCC) Patient with a pulse ox of 88% on room air on admission, due to sepsis from pneumonia.  3/18 developed acute worsening hypoxia after aspiration episode. Transferred to stepdown and pulmonology was consulted.  --Pulmonology consulted - signed off -- Continue supplement O2 per protocol --Target sats > 90% --Steroids -- on Decadron taper per Pulm's orders --Mucomyst nebs --Mgmt of PNA as outlined below.   Severe sepsis  Present on admission with bilateral pneumonia, hypotension, leukocytosis, tachycardia elevated lactic acid and acute hypoxic respiratory failure and acute kidney injury.  Completed sepsis IVF.   Antibiotics as below.   Multifocal pneumonia --Completed antibiotics:  Rocephin (7 days), (5 days), Flagyl (7 days)   (2 days Cefepime on admission) --Supportive care per orders   Hypokalemia - K 2.9 on 3/20 was replaced. Mg is 2.0. --Today's BMP pending - follow and replace K as needed --BMP in AM   Acute kidney injury superimposed on CKD  AKI on CKD stage IIIa.   Creatinine 2.27 on presentation.  Normalized to baseline. Cr 0.86 (3/23) --Monitor BMP   Elevated LFTs - improved Likely secondary to sepsis   Chronic obstructive pulmonary disease, unspecified --Continue nebulizers and steroids as above --Pulmonology consulted, have signed off   Rhabdomyolysis Hold Crestor.  No falls  at home.  PT evaluation.   Resolved with IV fluids.   Chronic diastolic CHF Currently no signs of heart failure.  Watch closely with gentle fluids.  Echo back in 2024 showed EF greater than 55%. 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 -Continue-Depakote, Lexapro, Seroquel   Essential (primary) hypertension Systolic BP's in 536'U to 150's.  BP meds have been on hold. --Resumed lisinopril 5 mg daily -Continue Lasix 20 mg PO daily given mild pedal edema as well   Subjective:  Patient seen and examined at bedside this morning Currently requiring 2 L of intranasal oxygen Denied worsening shortness of breath chest pain or cough TOC working on skilled nursing facility placement     Physical Exam: General exam: awake, alert, no acute distress Respiratory system: CTAB, no wheezes, rales or rhonchi, normal respiratory effort. Cardiovascular system: normal S1/S2, RRR  Gastrointestinal system: soft, NT, ND Central nervous system: no gross focal neurologic deficits, normal speech Extremities: trace pedal edema, normal tone Skin: dry, intact, normal temperature    Data Reviewed:    Latest Ref Rng & Units 08/05/2023    5:59 AM 08/03/2023    5:46 AM 08/01/2023    5:17 AM  CBC  WBC 4.0 - 10.5 K/uL 9.9  10.8  11.3   Hemoglobin 13.0 - 17.0 g/dL 44.0  34.7  9.6   Hematocrit 39.0 - 52.0 % 29.8  28.4  27.5   Platelets 150 - 400 K/uL 239  188  172        Latest Ref Rng & Units 08/05/2023    5:59 AM 08/03/2023    5:46 AM 08/02/2023    1:21 PM  BMP  Glucose 70 - 99 mg/dL 96  98  425   BUN 8 - 23 mg/dL 37  29  27   Creatinine 0.61 - 1.24 mg/dL 9.56  3.87  5.64   Sodium 135 - 145 mmol/L 136  135  137   Potassium 3.5 - 5.1 mmol/L 3.8  3.8  4.9   Chloride 98 - 111 mmol/L 100  99  100   CO2 22 - 32 mmol/L 29  29  28    Calcium 8.9 - 10.3 mg/dL 8.8  8.7  8.8      Vitals:   08/06/23 2133 08/07/23 0500 08/07/23 0725 08/07/23 1459  BP: (!) 157/83  131/73 120/69  Pulse: 99  86 100  Resp: 19  16 20   Temp: 97.6 F (36.4 C)  98 F (36.7 C) 98 F (36.7 C)  TempSrc: Oral  Oral Oral  SpO2: 97%  94% 97%  Weight:  79.5 kg    Height:          Author: Loyce Dys, MD 08/07/2023 5:11 PM  For on call review [email or web address].

## 2023-08-07 NOTE — TOC Progression Note (Signed)
 Transition of Care [facility]) - Progression Note    Patient Details  Name: Paul Vaughan MRN: [id number] Date of Birth: [birth year]  Transition of [facility]) CM/SW Contact  Marlowe Sax, RN Phone Number: 08/07/2023, 12:19 PM  Clinical Narrative:    Sherron Monday with Velora Heckler at DSS legal guardian reviewed the bed offers with her, she stated she did not know why he needed to go to STR, she stated that she will review and call back with choice    Expected Discharge Plan:  (TBD) Barriers to Discharge: No Barriers Identified  Expected Discharge Plan and Services   Discharge Planning Services: CM Consult   Living arrangements for the past 2 months: Group Home (Patient from [place] [facility].)                                       Social Determinants of Health (SDOH) Interventions SDOH Screenings   Food Insecurity: No Food Insecurity (07/29/2023)  Housing: Low Risk  (07/29/2023)  Transportation Needs: No Transportation Needs (07/29/2023)  Utilities: Not At Risk (07/29/2023)  Social Connections: Unknown (07/29/2023)  Tobacco Use: High Risk (07/29/2023)    Readmission Risk Interventions    07/01/2021    1:02 PM  Readmission Risk Prevention Plan  Transportation Screening Complete  PCP or Specialist Appt within 5-7 Days Complete  Home Care Screening Complete  Medication Review (RN CM) Complete

## 2023-08-08 DIAGNOSIS — J9601 Acute respiratory failure with hypoxia: Secondary | ICD-10-CM | POA: Diagnosis not present

## 2023-08-08 LAB — BASIC METABOLIC PANEL WITH GFR
Anion gap: 11 (ref 5–15)
BUN: 26 mg/dL — ABNORMAL HIGH (ref 8–23)
CO2: 27 mmol/L (ref 22–32)
Calcium: 8.9 mg/dL (ref 8.9–10.3)
Chloride: 98 mmol/L (ref 98–111)
Creatinine, Ser: 0.71 mg/dL (ref 0.61–1.24)
GFR, Estimated: >60 mL/min (ref >60)
Glucose, Bld: 78 mg/dL (ref 70–99)
Potassium: 4.2 mmol/L (ref 3.5–5.1)
Sodium: 136 mmol/L (ref 135–145)

## 2023-08-08 LAB — CBC WITH DIFFERENTIAL/PLATELET
Abs Immature Granulocytes: 0.23 10*3/uL — ABNORMAL HIGH (ref 0.00–0.07)
Basophils Absolute: 0.1 10*3/uL (ref 0.0–0.1)
Basophils Relative: 1 %
Eosinophils Absolute: 0.1 10*3/uL (ref 0.0–0.5)
Eosinophils Relative: 1 %
HCT: 33.8 % — ABNORMAL LOW (ref 39.0–52.0)
Hemoglobin: 11.6 g/dL — ABNORMAL LOW (ref 13.0–17.0)
Immature Granulocytes: 2 %
Lymphocytes Relative: 31 %
Lymphs Abs: 3 10*3/uL (ref 0.7–4.0)
MCH: 32.4 pg (ref 26.0–34.0)
MCHC: 34.3 g/dL (ref 30.0–36.0)
MCV: 94.4 fL (ref 80.0–100.0)
Monocytes Absolute: 0.9 10*3/uL (ref 0.1–1.0)
Monocytes Relative: 9 %
Neutro Abs: 5.6 10*3/uL (ref 1.7–7.7)
Neutrophils Relative %: 56 %
Platelets: 272 10*3/uL (ref 150–400)
RBC: 3.58 MIL/uL — ABNORMAL LOW (ref 4.22–5.81)
RDW: 16.4 % — ABNORMAL HIGH (ref 11.5–15.5)
Smear Review: NORMAL
WBC: 9.8 10*3/uL (ref 4.0–10.5)
nRBC: 0 % (ref 0.0–0.2)

## 2023-08-08 MED ORDER — IPRATROPIUM-ALBUTEROL 0.5-2.5 (3) MG/3ML IN SOLN
3.0000 mL | Freq: Two times a day (BID) | RESPIRATORY_TRACT | Status: DC
  Administered 2023-08-08: 3 mL via RESPIRATORY_TRACT
  Filled 2023-08-08: qty 3

## 2023-08-08 NOTE — Plan of Care (Signed)
  Problem: Clinical Measurements: Goal: Signs and symptoms of infection will decrease Outcome: Progressing   Problem: Respiratory: Goal: Ability to maintain adequate ventilation will improve Outcome: Progressing   Problem: Activity: Goal: Risk for activity intolerance will decrease Outcome: Progressing   Problem: Elimination: Goal: Will not experience complications related to bowel motility Outcome: Progressing   Problem: Pain Managment: Goal: General experience of comfort will improve and/or be controlled Outcome: Progressing   Problem: Safety: Goal: Ability to remain free from injury will improve Outcome: Progressing

## 2023-08-08 NOTE — TOC Progression Note (Signed)
 Transition of Care [facility]) - Progression Note    Patient Details  Name: Paul Vaughan MRN: [id number] Date of Birth: 01/10/1956  Transition of Care [facility]) CM/SW Contact  Marlowe Sax, RN Phone Number: 08/08/2023, 9:40 AM  Clinical Narrative:    Sent secure email to  [email or web address] The DSS Case worker who has Legal guardianship asking for bed choice to go to STR   Expected Discharge Plan:  (TBD) Barriers to Discharge: No Barriers Identified  Expected Discharge Plan and Services   Discharge Planning Services: CM Consult   Living arrangements for the past 2 months: Group Home (Patient from [place] [facility].)                                       Social Determinants of Health (SDOH) Interventions SDOH Screenings   Food Insecurity: No Food Insecurity (07/29/2023)  Housing: Low Risk  (07/29/2023)  Transportation Needs: No Transportation Needs (07/29/2023)  Utilities: Not At Risk (07/29/2023)  Social Connections: Unknown (07/29/2023)  Tobacco Use: High Risk (07/29/2023)    Readmission Risk Interventions    07/01/2021    1:02 PM  Readmission Risk Prevention Plan  Transportation Screening Complete  PCP or Specialist Appt within 5-7 Days Complete  Home Care Screening Complete  Medication Review (RN CM) Complete

## 2023-08-08 NOTE — Progress Notes (Signed)
 Progress Note   Patient: Paul Vaughan ZOX:[id number] DOB: Apr 08, 1956 DOA: 07/28/2023     11 DOS: the patient was seen and examined on 08/08/2023     Brief hospital course: From HPI "68 y.o. male with medical history significant of dementia, CKD stage IIIa, COPD, CHF, who presents to the ED due to generalized weakness.   History obtained through chart review due to patient's underlying dementia. Caregiver previously at bedside noted patient has been experiencing increased generalized weakness with poor appetite and episodes of urinary continence with increasing altered mental status over the last few days.  No reported fevers, vomiting, shortness of breath, chest pain, diarrhea.   ED course: On arrival to the ED, patient was hypotensive at 86/53 with heart rate of 90.  He was saturating at 88% on room air and subsequently placed on 3 L.  He was afebrile at 98.6.  Initial workup notable for WBC 12.5, BUN 64, creatinine 2.27, AST 189, ALT 76, GFR of 31.  Urinalysis with no leukocytes, nitrites or bacteria.  Chest x-ray with chronic interstitial lung changes, however increased opacities compared to prior with possible right apical pneumonia.  CT of the abdomen with no acute findings.  Patient started on IV fluids, azithromycin, ceftriaxone, DuoNebs, and Solu-Medrol.  TRH contacted for admission.  "   Hospital course as outlined below   3/17.  Continue IV antibiotics for sepsis and pneumonia, taper oxygen if able, IV fluids for rhabdomyolysis. 3/18.  Patient's respiratory status worsened requiring to be placed on 100% nonrebreather.  Suspect aspiration.  Chest x-ray done but pending results ABG showing a pO2 of 111 and a pH of 7.36 with a pCO2 of 43.  Patient admitted with sepsis and rhabdomyolysis.  CK down to 1195.  Transferred to stepdown unit.  Made NPO.  Changed Maxipime over to Unasyn.   I assumed care on 07/31/23.   3/19 -- respiratory status stabilized, oxygen sats stable on 4 L/min.   Stable to transfer to med/tele floor this afternoon.   3/20 -- on 4 L/min [place] O2, sats overall low 90's. 3/22 -- weaned to 3 L/min O2.  Stable for SNF discharge.     Assessment and Plan:   * Acute hypoxic respiratory failure (HCC) Patient with a pulse ox of 88% on room air on admission, due to sepsis from pneumonia.  3/18 developed acute worsening hypoxia after aspiration episode. Transferred to stepdown and pulmonology was consulted.   --Pulmonology consulted - signed off -- Continue supplement O2 per protocol --Target sats > 90% --Steroids -- on Decadron taper per Pulm's orders -Continue-Mucomyst nebs    Severe sepsis  Present on admission with bilateral pneumonia, hypotension, leukocytosis, tachycardia elevated lactic acid and acute hypoxic respiratory failure and acute kidney injury.  Completed sepsis IVF.   Antibiotics as below.   Multifocal pneumonia Has completed antibiotic course   Hypokalemia - K 2.9 on 3/20 was replaced. Mg is 2.0. Monitor BMP   Acute kidney injury superimposed on CKD  AKI on CKD stage IIIa.   Creatinine 2.27 on presentation.  Normalized to baseline. Cr 0.86 (3/23) --Monitor BMP   Elevated LFTs - improved Likely secondary to sepsis   Chronic obstructive pulmonary disease, unspecified --Continue nebulizers and steroids as above --Pulmonology consulted, have signed off   Rhabdomyolysis Hold Crestor.  No falls at home.  PT evaluation.   Resolved with IV fluids.   Chronic diastolic CHF Currently no signs of heart failure.  Watch closely with gentle fluids.  Echo back in  2024 showed EF greater than 55%. 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 -Continue-Depakote, Lexapro, Seroquel   Essential (primary) hypertension Systolic BP's in 562'Z to 150's.   BP meds have been on hold. --Resumed lisinopril 5 mg daily -Continue Lasix 20 mg PO daily given mild pedal edema as well   Subjective:  Patient seen and examined at bedside this morning Denied any acute overnight events Still awaiting skilled nursing facility placement     Physical Exam: General exam: awake, alert, no acute distress Respiratory system: CTAB, no wheezes, rales or rhonchi, normal respiratory effort. Cardiovascular system: normal S1/S2, RRR  Gastrointestinal system: soft, NT, ND Central nervous system: no gross focal neurologic deficits, normal speech Extremities: trace pedal edema, normal tone Skin: dry, intact, normal temperature     Data Reviewed:   Vitals:   08/08/23 0442 08/08/23 0500 08/08/23 0825 08/08/23 1731  BP: (!) 135/90  129/65 113/67  Pulse: 93  89 95  Resp: 20  17 18   Temp: 98.1 F (36.7 C)  97.6 F (36.4 C) 97.7 F (36.5 C)  TempSrc:      SpO2: 93%  95% 95%  Weight:  79.6 kg    Height:          Latest Ref Rng & Units 08/08/2023    4:30 AM 08/05/2023    5:59 AM 08/03/2023    5:46 AM  CBC  WBC 4.0 - 10.5 K/uL 9.8  9.9  10.8   Hemoglobin 13.0 - 17.0 g/dL 30.8  65.7  84.6   Hematocrit 39.0 - 52.0 % 33.8  29.8  28.4   Platelets 150 - 400 K/uL 272  239  188        Latest Ref Rng & Units 08/08/2023    4:30 AM 08/05/2023    5:59 AM 08/03/2023    5:46 AM  BMP  Glucose 70 - 99 mg/dL 78  96  98   BUN 8 - 23 mg/dL 26  37  29   Creatinine 0.61 - 1.24 mg/dL 9.62  9.52  8.41   Sodium 135 - 145 mmol/L 136  136  135   Potassium 3.5 - 5.1 mmol/L 4.2  3.8  3.8   Chloride 98 - 111 mmol/L 98  100  99   CO2 22 - 32 mmol/L 27  29  29    Calcium 8.9 - 10.3 mg/dL 8.9  8.8  8.7      Time spent: 38 minutes  Author: Loyce Dys, MD 08/08/2023 5:49 PM  For on call review [email or web address].

## 2023-08-09 DIAGNOSIS — J9601 Acute respiratory failure with hypoxia: Secondary | ICD-10-CM | POA: Diagnosis not present

## 2023-08-09 LAB — CBC WITH DIFFERENTIAL/PLATELET
Abs Immature Granulocytes: 0.22 10*3/uL — ABNORMAL HIGH (ref 0.00–0.07)
Basophils Absolute: 0 10*3/uL (ref 0.0–0.1)
Basophils Relative: 0 %
Eosinophils Absolute: 0.1 10*3/uL (ref 0.0–0.5)
Eosinophils Relative: 1 %
HCT: 29 % — ABNORMAL LOW (ref 39.0–52.0)
Hemoglobin: 10 g/dL — ABNORMAL LOW (ref 13.0–17.0)
Immature Granulocytes: 3 %
Lymphocytes Relative: 35 %
Lymphs Abs: 3 10*3/uL (ref 0.7–4.0)
MCH: 32.8 pg (ref 26.0–34.0)
MCHC: 34.5 g/dL (ref 30.0–36.0)
MCV: 95.1 fL (ref 80.0–100.0)
Monocytes Absolute: 0.6 10*3/uL (ref 0.1–1.0)
Monocytes Relative: 7 %
Neutro Abs: 4.6 10*3/uL (ref 1.7–7.7)
Neutrophils Relative %: 54 %
Platelets: 282 10*3/uL (ref 150–400)
RBC: 3.05 MIL/uL — ABNORMAL LOW (ref 4.22–5.81)
RDW: 16.8 % — ABNORMAL HIGH (ref 11.5–15.5)
WBC: 8.5 10*3/uL (ref 4.0–10.5)
nRBC: 0 % (ref 0.0–0.2)

## 2023-08-09 LAB — BASIC METABOLIC PANEL WITH GFR
Anion gap: 9 (ref 5–15)
BUN: 33 mg/dL — ABNORMAL HIGH (ref 8–23)
CO2: 28 mmol/L (ref 22–32)
Calcium: 8.8 mg/dL — ABNORMAL LOW (ref 8.9–10.3)
Chloride: 97 mmol/L — ABNORMAL LOW (ref 98–111)
Creatinine, Ser: 0.84 mg/dL (ref 0.61–1.24)
GFR, Estimated: >60 mL/min (ref >60)
Glucose, Bld: 110 mg/dL — ABNORMAL HIGH (ref 70–99)
Potassium: 4.5 mmol/L (ref 3.5–5.1)
Sodium: 134 mmol/L — ABNORMAL LOW (ref 135–145)

## 2023-08-09 NOTE — Plan of Care (Signed)

## 2023-08-09 NOTE — Progress Notes (Signed)
 Occupational Therapy Treatment Patient Details Name: Paul Vaughan MRN: [id number] DOB: [birth year] Today's Date: 08/09/2023   History of present illness Patient is a 68 year old male who presented to ED with weakness and limited ability to ambulate. Patient lives at [facility] and [facility]. PMH includes dementia, CKD stage IIIa, HTN. MD assessment includes: acute hypoxic respiratory failure due to sepsis from pneumonia, AKI, rhabdomyolysis, and hypokalemia.   OT comments  OT in room to assist NT with functional transfers. Pt found on RA, pt removed [place] line from wall and connected two ends together with [place] doffed around neck. SpO2 on RA 93% upon arrival. Left on RA, pt with mild increased WOB but declines feeling SOB. RN in room, pt left on RA with O2 sats 96% end of session. Pt completes functional sit<>stand transfers with CGA, max multimodal cuing for sequencing and hand placement, walks ~37ft forwards/backwards with MIN A for RW management and step pivot to sit EOB. 3x STS transfers with CGA, decreased multimodal cuing to only verbal cues by end of session. Pt making good progress, OT will continue to follow. Discharge recommendation appropriate.        If plan is discharge home, recommend the following:  Assistance with cooking/housework;Assist for transportation;Direct supervision/assist for medications management;Direct supervision/assist for financial management;Supervision due to cognitive status;Help with stairs or ramp for entrance;A lot of help with walking and/or transfers;A lot of help with bathing/dressing/bathroom   Equipment Recommendations  Other (comment)       Precautions / Restrictions Precautions Precautions: Fall Recall of Precautions/Restrictions: Impaired Precaution/Restrictions Comments: hx of IDD/dementia Restrictions Weight Bearing Restrictions Per Provider Order: No       Mobility Bed Mobility Overal bed mobility: Needs Assistance Bed Mobility: Sit to  Supine Rolling: Supervision   Supine to sit: Used rails, HOB elevated, Min assist     General bed mobility comments: max multimodal cuing. pt requires simple, 1 step directions for all task segmentation    Transfers Overall transfer level: Needs assistance Equipment used: Rolling walker (2 wheels) Transfers: Sit to/from Stand Sit to Stand: Contact guard assist     Step pivot transfers: Contact guard assist     General transfer comment: improved transfer ability, able to transfer with CGA and max multimodal cuing. completes x3 STS     Balance Overall balance assessment: Needs assistance Sitting-balance support: Feet supported, Single extremity supported Sitting balance-Leahy Scale: Good Sitting balance - Comments: SBA for donning gown   Standing balance support: Bilateral upper extremity supported, Reliant on assistive device for balance, During functional activity Standing balance-Leahy Scale: Fair                             ADL either performed or assessed with clinical judgement   ADL Overall ADL's : Needs assistance/impaired                                       General ADL Comments: NT, pt requests to get back in bed, session focused on mobility and transfers     Communication Communication Communication: Impaired Factors Affecting Communication: Reduced clarity of speech   Cognition Arousal: Alert Behavior During Therapy: Flat affect Cognition: No family/caregiver present to determine baseline, Cognition impaired           Executive functioning impairment (select all impairments): Problem solving, Reasoning, Sequencing OT - Cognition Comments:  Pt alert, oriented to self, and that he is from a group home; decreased safety awareness and problem solving noted                 Following commands: Impaired Following commands impaired: Follows one step commands inconsistently      Cueing   Cueing Techniques: Verbal cues,  Gestural cues, Visual cues  Exercises Exercises: Other exercises Other Exercises Other Exercises: STS x 3    Shoulder Instructions       General Comments Pt found on RA with [place] around neck. Pt removed [place] from wall; connected two ends together and placed on bed. SpO2 on RA 93% upon arrival. Left on RA, pt with mild increased WOB but declines feeling SOB. RN in room, pt left on RA with O2 sats 96%    Pertinent Vitals/ Pain       Pain Assessment Pain Assessment: No/denies pain   Frequency  Min 2X/week        Progress Toward Goals  OT Goals(current goals can now be found in the care plan section)  Progress towards OT goals: Progressing toward goals  Acute Rehab OT Goals OT Goal Formulation: With patient Time For Goal Achievement: 08/16/23 Potential to Achieve Goals: Fair ADL Goals Pt Will Perform Lower Body Dressing: with min assist;sit to/from stand;sitting/lateral leans Pt Will Transfer to Toilet: with min assist;bedside commode Pt Will Perform Toileting - Clothing Manipulation and hygiene: sitting/lateral leans;with supervision Additional ADL Goal #1: Pt will utilize learned ECS to support ADL participation and safety, PRN VC from staff, 3/3 opportunities  Plan         AM-PAC OT "6 Clicks" Daily Activity     Outcome Measure   Help from another person eating meals?: None Help from another person taking care of personal grooming?: A Little Help from another person toileting, which includes using toliet, bedpan, or urinal?: A Lot Help from another person bathing (including washing, rinsing, drying)?: A Lot Help from another person to put on and taking off regular upper body clothing?: A Little Help from another person to put on and taking off regular lower body clothing?: A Lot 6 Click Score: 16    End of Session Equipment Utilized During Treatment: Oxygen;Rolling walker (2 wheels)  OT Visit Diagnosis: Other abnormalities of gait and mobility (R26.89);Repeated falls  (R29.6);Muscle weakness (generalized) (M62.81)   Activity Tolerance Patient tolerated treatment well   Patient Left with call bell/phone within reach;in bed;with nursing/sitter in room;with bed alarm set   Nurse Communication Mobility status        Time: 1515-1530 OT Time Calculation (min): 15 min  Charges: OT General Charges $OT Visit: 1 Visit OT Treatments $Self Care/Home Management : 23-37 mins $Therapeutic Activity: 8-22 mins  Marixa Mellott L. Toney Lizaola, OTR/L  08/09/23, 4:24 PM

## 2023-08-09 NOTE — Plan of Care (Signed)

## 2023-08-09 NOTE — Progress Notes (Signed)
 Occupational Therapy Treatment Patient Details Name: Paul Vaughan MRN: [id number] DOB: [birth year] Today's Date: 08/09/2023   History of present illness Patient is a 68 year old male who presented to ED with weakness and limited ability to ambulate. Patient lives at [facility] and [facility]. PMH includes dementia, CKD stage IIIa, HTN. MD assessment includes: acute hypoxic respiratory failure due to sepsis from pneumonia, AKI, rhabdomyolysis, and hypokalemia.   OT comments  Pt seen for OT tx. Agreeable to participate with encouragement. Benefits from simple, step-by-step cuing for task segmentation due to cognitive deficits. Found to have large, loose BM - NT in room to assist with pericare and linen change. Pt requires MAX A for bed level pericare, rolls with max multimodal cuing and supervision, dons clean gown with MIN A, and transfers from bed to recliner with MIN A using RW. On 2L/min upon arrival, O2 sats above 95%, placed on 1L/min with O2 sats 95%. Left on 1L, RN notified. Pt making progress towards goals, continues to demo deficits in balance, cognition, activity tolerance and strength. Patient will benefit from continued inpatient follow up therapy, <3 hours/day       If plan is discharge home, recommend the following:  Assistance with cooking/housework;Assist for transportation;Direct supervision/assist for medications management;Direct supervision/assist for financial management;Supervision due to cognitive status;Help with stairs or ramp for entrance;A lot of help with walking and/or transfers;A lot of help with bathing/dressing/bathroom   Equipment Recommendations  Other (comment)       Precautions / Restrictions Precautions Precautions: Fall Recall of Precautions/Restrictions: Impaired Precaution/Restrictions Comments: hx of IDD/dementia Restrictions Weight Bearing Restrictions Per Provider Order: No       Mobility Bed Mobility Overal bed mobility: Needs  Assistance Bed Mobility: Supine to Sit, Rolling Rolling: Supervision   Supine to sit: Used rails, HOB elevated, Min assist     General bed mobility comments: max multimodal cuing. pt requires simple, 1 step directions for all task segmentation    Transfers Overall transfer level: Needs assistance Equipment used: Rolling walker (2 wheels) Transfers: Sit to/from Stand Sit to Stand: Min assist     Step pivot transfers: Min assist     General transfer comment: pt with noted decreased problem solving. requires max multimodal cuing for hand placement and sequencing     Balance Overall balance assessment: Needs assistance Sitting-balance support: Feet supported, Single extremity supported Sitting balance-Leahy Scale: Good Sitting balance - Comments: SBA for donning gown   Standing balance support: Bilateral upper extremity supported, Reliant on assistive device for balance, During functional activity Standing balance-Leahy Scale: Fair                             ADL either performed or assessed with clinical judgement   ADL Overall ADL's : Needs assistance/impaired                 Upper Body Dressing : Sitting;Minimal assistance Upper Body Dressing Details (indicate cue type and reason): sitting EOB         Toileting- Clothing Manipulation and Hygiene: Maximal assistance;+2 for physical assistance;Bed level Toileting - Clothing Manipulation Details (indicate cue type and reason): found to be incontinent of BM; NT in room to assist with pericare bed level. pt able to roll with supervision       General ADL Comments: max encourgement to participat in session     Communication Communication Communication: Impaired Factors Affecting Communication: Reduced clarity of speech   Cognition  Arousal: Alert Behavior During Therapy: Flat affect Cognition: No family/caregiver present to determine baseline, Cognition impaired           Executive functioning  impairment (select all impairments): Problem solving, Reasoning, Sequencing OT - Cognition Comments: Pt alert, oriented to self, and that he is from a group home; decreased safety awareness and problem solving noted                 Following commands: Impaired Following commands impaired: Follows one step commands inconsistently      Cueing   Cueing Techniques: Verbal cues, Gestural cues, Visual cues        General Comments SpO2 96% on 2L, weaned down to 1L, 95%>. Notified RN of O2 levels and pt position    Pertinent Vitals/ Pain       Pain Assessment Pain Assessment: No/denies pain   Frequency  Min 2X/week        Progress Toward Goals  OT Goals(current goals can now be found in the care plan section)  Progress towards OT goals: Progressing toward goals  Acute Rehab OT Goals OT Goal Formulation: With patient Time For Goal Achievement: 08/16/23 Potential to Achieve Goals: Fair ADL Goals Pt Will Perform Lower Body Dressing: with min assist;sit to/from stand;sitting/lateral leans Pt Will Transfer to Toilet: with min assist;bedside commode Pt Will Perform Toileting - Clothing Manipulation and hygiene: sitting/lateral leans;with supervision Additional ADL Goal #1: Pt will utilize learned ECS to support ADL participation and safety, PRN VC from staff, 3/3 opportunities  Plan         AM-PAC OT "6 Clicks" Daily Activity     Outcome Measure   Help from another person eating meals?: None Help from another person taking care of personal grooming?: A Little Help from another person toileting, which includes using toliet, bedpan, or urinal?: A Lot Help from another person bathing (including washing, rinsing, drying)?: A Lot Help from another person to put on and taking off regular upper body clothing?: A Little Help from another person to put on and taking off regular lower body clothing?: A Lot 6 Click Score: 16    End of Session Equipment Utilized During Treatment:  Gait belt;Oxygen;Rolling walker (2 wheels)  OT Visit Diagnosis: Other abnormalities of gait and mobility (R26.89);Repeated falls (R29.6);Muscle weakness (generalized) (M62.81)   Activity Tolerance Patient tolerated treatment well   Patient Left in chair;with call bell/phone within reach;with chair alarm set   Nurse Communication Mobility status        Time: 0981-1914 OT Time Calculation (min): 35 min  Charges: OT General Charges $OT Visit: 1 Visit OT Treatments $Self Care/Home Management : 23-37 mins  Ecko Beasley L. Adair Lemar, OTR/L  08/09/23, 1:15 PM

## 2023-08-09 NOTE — TOC Progression Note (Signed)
 Transition of Care [facility]) - Progression Note    Patient Details  Name: Paul Vaughan MRN: [id number] Date of Birth: 02/22/1956  Transition of [facility] CM/SW Contact  Marlowe Sax, RN Phone Number: 08/09/2023, 11:50 AM  Clinical Narrative:    Reached out to the case worker at [email or web address]  sent secure email and included Kailee Marrow the DSS supervisor, I requested to make a bed choice to go to STR    Expected Discharge Plan:  (TBD) Barriers to Discharge: No Barriers Identified  Expected Discharge Plan and Services   Discharge Planning Services: CM Consult   Living arrangements for the past 2 months: Group Home (Patient from [place] and [facility].)                                       Social Determinants of Health (SDOH) Interventions SDOH Screenings   Food Insecurity: No Food Insecurity (07/29/2023)  Housing: Low Risk  (07/29/2023)  Transportation Needs: No Transportation Needs (07/29/2023)  Utilities: Not At Risk (07/29/2023)  Social Connections: Unknown (07/29/2023)  Tobacco Use: High Risk (07/29/2023)    Readmission Risk Interventions    07/01/2021    1:02 PM  Readmission Risk Prevention Plan  Transportation Screening Complete  PCP or Specialist Appt within 5-7 Days Complete  Home Care Screening Complete  Medication Review (RN CM) Complete

## 2023-08-09 NOTE — Progress Notes (Signed)
 Progress Note   Patient: Paul Vaughan WNU:[id number] DOB: 1956/03/20 DOA: 07/28/2023     12 DOS: the patient was seen and examined on 08/09/2023     Brief hospital course: From HPI "68 y.o. male with medical history significant of dementia, CKD stage IIIa, COPD, CHF, who presents to the ED due to generalized weakness.   History obtained through chart review due to patient's underlying dementia. Caregiver previously at bedside noted patient has been experiencing increased generalized weakness with poor appetite and episodes of urinary continence with increasing altered mental status over the last few days.  No reported fevers, vomiting, shortness of breath, chest pain, diarrhea.   ED course: On arrival to the ED, patient was hypotensive at 86/53 with heart rate of 90.  He was saturating at 88% on room air and subsequently placed on 3 L.  He was afebrile at 98.6.  Initial workup notable for WBC 12.5, BUN 64, creatinine 2.27, AST 189, ALT 76, GFR of 31.  Urinalysis with no leukocytes, nitrites or bacteria.  Chest x-ray with chronic interstitial lung changes, however increased opacities compared to prior with possible right apical pneumonia.  CT of the abdomen with no acute findings.  Patient started on IV fluids, azithromycin, ceftriaxone, DuoNebs, and Solu-Medrol.  TRH contacted for admission.  "   Hospital course as outlined below   3/17.  Continue IV antibiotics for sepsis and pneumonia, taper oxygen if able, IV fluids for rhabdomyolysis. 3/18.  Patient's respiratory status worsened requiring to be placed on 100% nonrebreather.  Suspect aspiration.  Chest x-ray done but pending results ABG showing a pO2 of 111 and a pH of 7.36 with a pCO2 of 43.  Patient admitted with sepsis and rhabdomyolysis.  CK down to 1195.  Transferred to stepdown unit.  Made NPO.  Changed Maxipime over to Unasyn.   I assumed care on 07/31/23.   3/19 -- respiratory status stabilized, oxygen sats stable on 4 L/min.   Stable to transfer to med/tele floor this afternoon.   3/20 -- on 4 L/min  O2, sats overall low 90's. 3/22 -- weaned to 3 L/min O2.  Stable for SNF discharge.     Assessment and Plan:   * Acute hypoxic respiratory failure (HCC) Patient with a pulse ox of 88% on room air on admission, due to sepsis from pneumonia.  3/18 developed acute worsening hypoxia after aspiration episode. Transferred to stepdown and pulmonology was consulted.   --Pulmonology consulted - signed off -- Continue supplement O2 per protocol --Target sats > 90% --Steroids -- on Decadron taper per Pulm's orders -Continue-Mucomyst nebs     Severe sepsis  Present on admission with bilateral pneumonia, hypotension, leukocytosis, tachycardia elevated lactic acid and acute hypoxic respiratory failure and acute kidney injury.  Completed sepsis IVF.   Has completed antibiotics   Multifocal pneumonia Has completed antibiotic course   Hypokalemia - K 2.9 on 3/20 was replaced. Mg is 2.0. Monitor BMP   Acute kidney injury superimposed on CKD  AKI on CKD stage IIIa.   Creatinine 2.27 on presentation.  Normalized to baseline. Cr 0.86 (3/23) Monitor renal function   Elevated LFTs - improved Likely secondary to sepsis   Chronic obstructive pulmonary disease, unspecified --Continue nebulizers and steroids as above --Pulmonology consulted, have signed off   Rhabdomyolysis Hold Crestor.  No falls at home.  PT evaluation.   Resolved with IV fluids.   Chronic diastolic CHF Currently no signs of heart failure.  Watch closely with gentle fluids.  Echo  back in 2024 showed EF greater than 55%. 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 Continue-Depakote, Lexapro, Seroquel   Essential (primary) hypertension Systolic BP's in  140's to 150's.  BP meds have been on hold. --Resumed lisinopril 5 mg daily -Continue Lasix 20 mg PO daily given mild pedal edema as well   Subjective:  Patient seen and examined at bedside this morning Denies nausea vomiting abdominal pain or chest pain Still awaiting placement TOC still working with DSS     Physical Exam: General exam: awake, alert, no acute distress Respiratory system: CTAB, no wheezes, rales or rhonchi, normal respiratory effort. Cardiovascular system: normal S1/S2, RRR  Gastrointestinal system: soft, NT, ND Central nervous system: no gross focal neurologic deficits, normal speech Extremities: trace pedal edema, normal tone Skin: dry, intact, normal temperature     Data Reviewed:      Latest Ref Rng & Units 08/09/2023    5:10 AM 08/08/2023    4:30 AM 08/05/2023    5:59 AM  CBC  WBC 4.0 - 10.5 K/uL 8.5  9.8  9.9   Hemoglobin 13.0 - 17.0 g/dL 21.3  08.6  57.8   Hematocrit 39.0 - 52.0 % 29.0  33.8  29.8   Platelets 150 - 400 K/uL 282  272  239        Latest Ref Rng & Units 08/09/2023    5:10 AM 08/08/2023    4:30 AM 08/05/2023    5:59 AM  BMP  Glucose 70 - 99 mg/dL 469  78  96   BUN 8 - 23 mg/dL 33  26  37   Creatinine 0.61 - 1.24 mg/dL 6.29  5.28  4.13   Sodium 135 - 145 mmol/L 134  136  136   Potassium 3.5 - 5.1 mmol/L 4.5  4.2  3.8   Chloride 98 - 111 mmol/L 97  98  100   CO2 22 - 32 mmol/L 28  27  29    Calcium 8.9 - 10.3 mg/dL 8.8  8.9  8.8      Vitals:   08/09/23 0318 08/09/23 0454 08/09/23 0853 08/09/23 1544  BP: (!) 92/54  125/68 128/79  Pulse: 97  87 93  Resp: 16  16 16   Temp: 97.7 F (36.5 C)  97.9 F (36.6 C) 98.1 F (36.7 C)  TempSrc:    Oral  SpO2: 95%  94% 93%  Weight:  78.4 kg    Height:         Author: Loyce Dys, MD 08/09/2023 4:22 PM  For on call review [email or web address].

## 2023-08-10 DIAGNOSIS — J9601 Acute respiratory failure with hypoxia: Secondary | ICD-10-CM | POA: Diagnosis not present

## 2023-08-10 LAB — CBC WITH DIFFERENTIAL/PLATELET
Abs Immature Granulocytes: 0.3 10*3/uL — ABNORMAL HIGH (ref 0.00–0.07)
Basophils Absolute: 0 10*3/uL (ref 0.0–0.1)
Basophils Relative: 0 %
Eosinophils Absolute: 0.1 10*3/uL (ref 0.0–0.5)
Eosinophils Relative: 1 %
HCT: 30.1 % — ABNORMAL LOW (ref 39.0–52.0)
Hemoglobin: 10.4 g/dL — ABNORMAL LOW (ref 13.0–17.0)
Immature Granulocytes: 3 %
Lymphocytes Relative: 36 %
Lymphs Abs: 3.2 10*3/uL (ref 0.7–4.0)
MCH: 32.9 pg (ref 26.0–34.0)
MCHC: 34.6 g/dL (ref 30.0–36.0)
MCV: 95.3 fL (ref 80.0–100.0)
Monocytes Absolute: 0.8 10*3/uL (ref 0.1–1.0)
Monocytes Relative: 9 %
Neutro Abs: 4.5 10*3/uL (ref 1.7–7.7)
Neutrophils Relative %: 51 %
Platelets: 297 10*3/uL (ref 150–400)
RBC: 3.16 MIL/uL — ABNORMAL LOW (ref 4.22–5.81)
RDW: 16.7 % — ABNORMAL HIGH (ref 11.5–15.5)
Smear Review: NORMAL
WBC: 9.1 10*3/uL (ref 4.0–10.5)
nRBC: 0 % (ref 0.0–0.2)

## 2023-08-10 LAB — BASIC METABOLIC PANEL WITH GFR
Anion gap: 7 (ref 5–15)
BUN: 31 mg/dL — ABNORMAL HIGH (ref 8–23)
CO2: 28 mmol/L (ref 22–32)
Calcium: 9.3 mg/dL (ref 8.9–10.3)
Chloride: 99 mmol/L (ref 98–111)
Creatinine, Ser: 0.78 mg/dL (ref 0.61–1.24)
GFR, Estimated: >60 mL/min (ref >60)
Glucose, Bld: 98 mg/dL (ref 70–99)
Potassium: 4.7 mmol/L (ref 3.5–5.1)
Sodium: 134 mmol/L — ABNORMAL LOW (ref 135–145)

## 2023-08-10 NOTE — Progress Notes (Signed)
 Progress Note   Patient: Paul Vaughan ZOX:[id number] DOB: 15-Jul-1955 DOA: 07/28/2023     13 DOS: the patient was seen and examined on 08/10/2023     Brief hospital course: From HPI "68 y.o. male with medical history significant of dementia, CKD stage IIIa, COPD, CHF, who presents to the ED due to generalized weakness.   History obtained through chart review due to patient's underlying dementia. Caregiver previously at bedside noted patient has been experiencing increased generalized weakness with poor appetite and episodes of urinary continence with increasing altered mental status over the last few days.  No reported fevers, vomiting, shortness of breath, chest pain, diarrhea.   ED course: On arrival to the ED, patient was hypotensive at 86/53 with heart rate of 90.  He was saturating at 88% on room air and subsequently placed on 3 L.  He was afebrile at 98.6.  Initial workup notable for WBC 12.5, BUN 64, creatinine 2.27, AST 189, ALT 76, GFR of 31.  Urinalysis with no leukocytes, nitrites or bacteria.  Chest x-ray with chronic interstitial lung changes, however increased opacities compared to prior with possible right apical pneumonia.  CT of the abdomen with no acute findings.  Patient started on IV fluids, azithromycin, ceftriaxone, DuoNebs, and Solu-Medrol.  TRH contacted for admission.  "   Hospital course as outlined below   3/17.  Continue IV antibiotics for sepsis and pneumonia, taper oxygen if able, IV fluids for rhabdomyolysis. 3/18.  Patient's respiratory status worsened requiring to be placed on 100% nonrebreather.  Suspect aspiration.  Chest x-ray done but pending results ABG showing a pO2 of 111 and a pH of 7.36 with a pCO2 of 43.  Patient admitted with sepsis and rhabdomyolysis.  CK down to 1195.  Transferred to stepdown unit.  Made NPO.  Changed Maxipime over to Unasyn.   I assumed care on 07/31/23.   3/19 -- respiratory status stabilized, oxygen sats stable on 4 L/min.   Stable to transfer to med/tele floor this afternoon.   3/20 -- on 4 L/min [place] O2, sats overall low 90's. 3/22 -- weaned to 3 L/min O2.  Stable for SNF discharge.     Assessment and Plan:   * Acute hypoxic respiratory failure (HCC) Patient with a pulse ox of 88% on room air on admission, due to sepsis from pneumonia.  3/18 developed acute worsening hypoxia after aspiration episode. Transferred to stepdown and pulmonology was consulted.   --Pulmonology consulted - signed off -- Continue supplement O2 per protocol --Target sats > 90% --Steroids -- on Decadron taper per Pulm's orders -Continue-Mucomyst nebs     Severe sepsis  Present on admission with bilateral pneumonia, hypotension, leukocytosis, tachycardia elevated lactic acid and acute hypoxic respiratory failure and acute kidney injury.  Completed sepsis IVF.   Has completed antibiotics   Multifocal pneumonia Has completed antibiotic course   Hypokalemia - K 2.9 on 3/20 was replaced. Mg is 2.0. Monitor BMP   Acute kidney injury superimposed on CKD  AKI on CKD stage IIIa.   Creatinine 2.27 on presentation.  Normalized to baseline. Cr 0.86 (3/23) Monitor renal function   Elevated LFTs - improved Likely secondary to sepsis   Chronic obstructive pulmonary disease, unspecified --Continue nebulizers and steroids as above --Pulmonology consulted, have signed off   Rhabdomyolysis Hold Crestor.  No falls at home.  PT evaluation.   Resolved with IV fluids.   Chronic diastolic CHF Currently no signs of heart failure.  Watch closely with gentle fluids.  Echo  back in 2024 showed EF greater than 55%. 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 Continue-Depakote, Lexapro, Seroquel   Essential (primary) hypertension Systolic BP's in  140's to 150's.  BP meds have been on hold. --Resumed lisinopril 5 mg daily -Continue Lasix 20 mg PO daily given mild pedal edema as well   Subjective:  Patient seen and examined at bedside this morning He denies nausea vomiting abdominal pain or chest pain Still awaiting placement TOC still talking with DSS     Physical Exam: General exam: awake, alert, no acute distress Respiratory system: CTAB, no wheezes, rales or rhonchi, normal respiratory effort. Cardiovascular system: normal S1/S2, RRR  Gastrointestinal system: soft, NT, ND Central nervous system: no gross focal neurologic deficits, normal speech Extremities: trace pedal edema, normal tone Skin: dry, intact, normal temperature     Data Reviewed:   Vitals:   08/10/23 0500 08/10/23 0859 08/10/23 1539 08/10/23 1549  BP:  134/78 (!) 94/54 (!) 90/50  Pulse:  87 (!) 101   Resp:  14 16   Temp:  98.1 F (36.7 C) 97.8 F (36.6 C)   TempSrc:      SpO2:  93% 90%   Weight: 77.7 kg     Height:          Latest Ref Rng & Units 08/10/2023    4:08 AM 08/09/2023    5:10 AM 08/08/2023    4:30 AM  CBC  WBC 4.0 - 10.5 K/uL 9.1  8.5  9.8   Hemoglobin 13.0 - 17.0 g/dL 16.1  09.6  04.5   Hematocrit 39.0 - 52.0 % 30.1  29.0  33.8   Platelets 150 - 400 K/uL 297  282  272        Latest Ref Rng & Units 08/10/2023    4:08 AM 08/09/2023    5:10 AM 08/08/2023    4:30 AM  BMP  Glucose 70 - 99 mg/dL 98  409  78   BUN 8 - 23 mg/dL 31  33  26   Creatinine 0.61 - 1.24 mg/dL 8.11  9.14  7.82   Sodium 135 - 145 mmol/L 134  134  136   Potassium 3.5 - 5.1 mmol/L 4.7  4.5  4.2   Chloride 98 - 111 mmol/L 99  97  98   CO2 22 - 32 mmol/L 28  28  27    Calcium 8.9 - 10.3 mg/dL 9.3  8.8  8.9      Author: Loyce Dys, MD 08/10/2023 4:20 PM  For on call review [email or web address].

## 2023-08-10 NOTE — Plan of Care (Signed)

## 2023-08-10 NOTE — Plan of Care (Signed)
  Problem: Fluid Volume: Goal: Hemodynamic stability will improve Outcome: Progressing   Problem: Respiratory: Goal: Ability to maintain adequate ventilation will improve Outcome: Progressing   Problem: Activity: Goal: Risk for activity intolerance will decrease Outcome: Progressing   Problem: Pain Managment: Goal: General experience of comfort will improve and/or be controlled Outcome: Progressing   Problem: Safety: Goal: Ability to remain free from injury will improve Outcome: Progressing   Problem: Skin Integrity: Goal: Risk for impaired skin integrity will decrease Outcome: Progressing

## 2023-08-11 DIAGNOSIS — J9601 Acute respiratory failure with hypoxia: Secondary | ICD-10-CM | POA: Diagnosis not present

## 2023-08-11 NOTE — Plan of Care (Signed)
  Problem: Fluid Volume: Goal: Hemodynamic stability will improve Outcome: Progressing   Problem: Respiratory: Goal: Ability to maintain adequate ventilation will improve Outcome: Progressing   Problem: Education: Goal: Knowledge of General Education information will improve Description: Including pain rating scale, medication(s)/side effects and non-pharmacologic comfort measures Outcome: Progressing   Problem: Nutrition: Goal: Adequate nutrition will be maintained Outcome: Progressing   Problem: Pain Managment: Goal: General experience of comfort will improve and/or be controlled Outcome: Progressing   Problem: Safety: Goal: Ability to remain free from injury will improve Outcome: Progressing   Problem: Skin Integrity: Goal: Risk for impaired skin integrity will decrease Outcome: Progressing

## 2023-08-11 NOTE — Plan of Care (Signed)

## 2023-08-11 NOTE — Progress Notes (Signed)
 Progress Note   Patient: Paul Vaughan YQM:[id number] DOB: 13-May-1956 DOA: 07/28/2023     14 DOS: the patient was seen and examined on 08/11/2023     Brief hospital course: From HPI "68 y.o. male with medical history significant of dementia, CKD stage IIIa, COPD, CHF, who presents to the ED due to generalized weakness.   History obtained through chart review due to patient's underlying dementia. Caregiver previously at bedside noted patient has been experiencing increased generalized weakness with poor appetite and episodes of urinary continence with increasing altered mental status over the last few days.  No reported fevers, vomiting, shortness of breath, chest pain, diarrhea.   ED course: On arrival to the ED, patient was hypotensive at 86/53 with heart rate of 90.  He was saturating at 88% on room air and subsequently placed on 3 L.  He was afebrile at 98.6.  Initial workup notable for WBC 12.5, BUN 64, creatinine 2.27, AST 189, ALT 76, GFR of 31.  Urinalysis with no leukocytes, nitrites or bacteria.  Chest x-ray with chronic interstitial lung changes, however increased opacities compared to prior with possible right apical pneumonia.  CT of the abdomen with no acute findings.  Patient started on IV fluids, azithromycin, ceftriaxone, DuoNebs, and Solu-Medrol.  TRH contacted for admission.  "   Hospital course as outlined below   3/17.  Continue IV antibiotics for sepsis and pneumonia, taper oxygen if able, IV fluids for rhabdomyolysis. 3/18.  Patient's respiratory status worsened requiring to be placed on 100% nonrebreather.  Suspect aspiration.  Chest x-ray done but pending results ABG showing a pO2 of 111 and a pH of 7.36 with a pCO2 of 43.  Patient admitted with sepsis and rhabdomyolysis.  CK down to 1195.  Transferred to stepdown unit.  Made NPO.  Changed Maxipime over to Unasyn.   I assumed care on 07/31/23.   3/19 -- respiratory status stabilized, oxygen sats stable on 4 L/min.   Stable to transfer to med/tele floor this afternoon.   3/20 -- on 4 L/min [place] O2, sats overall low 90's. 3/22 -- weaned to 3 L/min O2.  Stable for SNF discharge.     Assessment and Plan:   * Acute hypoxic respiratory failure (HCC) Patient with a pulse ox of 88% on room air on admission, due to sepsis from pneumonia.  3/18 developed acute worsening hypoxia after aspiration episode. Transferred to stepdown and pulmonology was consulted.   --Pulmonology consulted - signed off -- Continue supplement O2 per protocol --Target sats > 90% --Steroids -- on Decadron taper per Pulm's orders -Continue-Mucomyst nebs     Severe sepsis  Present on admission with bilateral pneumonia, hypotension, leukocytosis, tachycardia elevated lactic acid and acute hypoxic respiratory failure and acute kidney injury.  Completed sepsis IVF.   Has completed antibiotics   Multifocal pneumonia Has completed antibiotic course   Hypokalemia - K 2.9 on 3/20 was replaced. Mg is 2.0. Monitor BMP   Acute kidney injury superimposed on CKD  AKI on CKD stage IIIa.   Creatinine 2.27 on presentation.  Normalized to baseline. Cr 0.86 (3/23) Monitor renal function   Elevated LFTs - improved Likely secondary to sepsis   Chronic obstructive pulmonary disease, unspecified --Continue nebulizers and steroids as above --Pulmonology consulted, have signed off   Rhabdomyolysis Hold Crestor.  No falls at home.  PT evaluation.   Resolved with IV fluids.   Chronic diastolic CHF Currently no signs of heart failure.  Watch closely with gentle fluids.  Echo  back in 2024 showed EF greater than 55%. 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 Continue-Depakote, Lexapro, Seroquel   Essential (primary) hypertension Systolic BP's in  140's to 150's.  BP meds have been on hold. --Resumed lisinopril 5 mg daily -Continue Lasix 20 mg PO daily given mild pedal edema as well   Subjective:  Patient seen and examined at bedside this morning Patient has no acute overnight event TOC still awaiting DSS decision     Physical Exam: General exam: awake, alert, no acute distress Respiratory system: CTAB, no wheezes, rales or rhonchi, normal respiratory effort. Cardiovascular system: normal S1/S2, RRR  Gastrointestinal system: soft, NT, ND Central nervous system: no gross focal neurologic deficits, normal speech Extremities: trace pedal edema, normal tone Skin: dry, intact, normal temperature     Data Reviewed:    Vitals:   08/10/23 1958 08/10/23 2300 08/11/23 0010 08/11/23 0836  BP: 128/61  (!) 149/72 (!) 102/49  Pulse: 95  92 87  Resp: 17  17 17   Temp: 99.1 F (37.3 C) 99 F (37.2 C) 100 F (37.8 C) 98.7 F (37.1 C)  TempSrc: Oral  Axillary   SpO2: 93%  93% 90%  Weight:      Height:          Latest Ref Rng & Units 08/10/2023    4:08 AM 08/09/2023    5:10 AM 08/08/2023    4:30 AM  CBC  WBC 4.0 - 10.5 K/uL 9.1  8.5  9.8   Hemoglobin 13.0 - 17.0 g/dL 16.1  09.6  04.5   Hematocrit 39.0 - 52.0 % 30.1  29.0  33.8   Platelets 150 - 400 K/uL 297  282  272        Latest Ref Rng & Units 08/10/2023    4:08 AM 08/09/2023    5:10 AM 08/08/2023    4:30 AM  BMP  Glucose 70 - 99 mg/dL 98  409  78   BUN 8 - 23 mg/dL 31  33  26   Creatinine 0.61 - 1.24 mg/dL 8.11  9.14  7.82   Sodium 135 - 145 mmol/L 134  134  136   Potassium 3.5 - 5.1 mmol/L 4.7  4.5  4.2   Chloride 98 - 111 mmol/L 99  97  98   CO2 22 - 32 mmol/L 28  28  27    Calcium 8.9 - 10.3 mg/dL 9.3  8.8  8.9    .  Author: Loyce Dys, MD 08/11/2023 5:04 PM  For on call review [email or web address].

## 2023-08-11 NOTE — Progress Notes (Signed)
 Physical Therapy Treatment Patient Details Name: Paul Vaughan MRN: [id number] DOB: 08-30-1955 Today's Date: 08/11/2023   History of Present Illness Patient is a 68 year old male who presented to ED with weakness and limited ability to ambulate. Patient lives at [facility] and [facility]. PMH includes dementia, CKD stage IIIa, HTN. MD assessment includes: acute hypoxic respiratory failure due to sepsis from pneumonia, AKI, rhabdomyolysis, and hypokalemia.    PT Comments  Pt with very flat affect with little verbal communication but alert, able to follow 1-step commands with extra time and cuing, and put forth good effort during the session.  Pt required extra time, effort, and cuing for sequencing with bed mobility, transfers, and gait but no physical assistance.  Pt able to amb a max of around 4 feet x 2 with seated rest break between bouts.  Pt participated in static standing with UE support for increased activity tolerance with mas standing time of 2-3 min per bout.  Pt reported no adverse symptoms during the session with SpO2 >/= 90% and HR WNL throughout.  Pt will benefit from continued PT services upon discharge to safely address deficits listed in patient problem list for decreased caregiver assistance and eventual return to PLOF.        If plan is discharge home, recommend the following: A lot of help with walking and/or transfers;A little help with bathing/dressing/bathroom;Assistance with cooking/housework;Direct supervision/assist for medications management;Supervision due to cognitive status;Assist for transportation;Help with stairs or ramp for entrance   Can travel by private vehicle     Yes  Equipment Recommendations  Other (comment) (TBD)    Recommendations for Other Services       Precautions / Restrictions Precautions Precautions: Fall Precaution/Restrictions Comments: hx of IDD/dementia Restrictions Weight Bearing Restrictions Per Provider Order: No      Mobility  Bed Mobility Overal bed mobility: Needs Assistance Bed Mobility: Supine to Sit     Supine to sit: Supervision     General bed mobility comments: Extra time, effort, and use of the bed rail    Transfers Overall transfer level: Needs assistance Equipment used: Rolling walker (2 wheels) Transfers: Sit to/from Stand Sit to Stand: Contact guard assist           General transfer comment: Mod multi-modal cues for hand placement    Ambulation/Gait Ambulation/Gait assistance: Contact guard assist Gait Distance (Feet): 4 Feet x 2 Assistive device: Rolling walker (2 wheels) Gait Pattern/deviations: Step-through pattern, Decreased step length - right, Decreased step length - left, Trunk flexed Gait velocity: decreased     General Gait Details: Slow cadence with short B step length, trunk flexed, and mod lean on the RW for support   Stairs             Wheelchair Mobility     Tilt Bed    Modified Rankin (Stroke Patients Only)       Balance Overall balance assessment: Needs assistance Sitting-balance support: Feet supported, Single extremity supported Sitting balance-Leahy Scale: Good     Standing balance support: Bilateral upper extremity supported, Reliant on assistive device for balance, During functional activity Standing balance-Leahy Scale: Fair                              Hotel manager: Impaired Factors Affecting Communication: Reduced clarity of speech  Cognition Arousal: Alert Behavior During Therapy: Flat affect   PT - Cognitive impairments: History of cognitive impairments  Following commands: Impaired Following commands impaired: Follows one step commands with increased time    Cueing Cueing Techniques: Verbal cues, Gestural cues, Visual cues, Tactile cues  Exercises Total Joint Exercises Long Arc Quad: AROM, Strengthening, Both, 10 reps Knee Flexion:  AROM, Strengthening, Both, 10 reps Other Exercises Other Exercises: Static standing with BUE support 2 x 2-3 min for improved activity tolerance    General Comments        Pertinent Vitals/Pain Pain Assessment Pain Assessment: No/denies pain    Home Living                          Prior Function            PT Goals (current goals can now be found in the care plan section) Progress towards PT goals: Progressing toward goals    Frequency    Min 2X/week      PT Plan      Co-evaluation              AM-PAC PT "6 Clicks" Mobility   Outcome Measure  Help needed turning from your back to your side while in a flat bed without using bedrails?: A Little Help needed moving from lying on your back to sitting on the side of a flat bed without using bedrails?: A Little Help needed moving to and from a bed to a chair (including a wheelchair)?: A Little Help needed standing up from a chair using your arms (e.g., wheelchair or bedside chair)?: A Little Help needed to walk in hospital room?: A Lot Help needed climbing 3-5 steps with a railing? : Total 6 Click Score: 15    End of Session Equipment Utilized During Treatment: Oxygen;Gait belt Activity Tolerance: Patient tolerated treatment well Patient left: in chair;with chair alarm set;with call bell/phone within reach Nurse Communication: Mobility status PT Visit Diagnosis: Muscle weakness (generalized) (M62.81);Other abnormalities of gait and mobility (R26.89)     Time: 1610-9604 PT Time Calculation (min) (ACUTE ONLY): 20 min  Charges:    $Therapeutic Activity: 8-22 mins PT General Charges $$ ACUTE PT VISIT: 1 Visit                     D. Scott Alwin Lanigan PT, DPT 08/11/23, 11:14 AM

## 2023-08-12 DIAGNOSIS — J9601 Acute respiratory failure with hypoxia: Secondary | ICD-10-CM | POA: Diagnosis not present

## 2023-08-12 NOTE — Progress Notes (Signed)
 Occupational Therapy Treatment Patient Details Name: Paul Vaughan MRN: [id number] DOB: [birth year] Today's Date: 08/12/2023   History of present illness Patient is a 68 year old male who presented to ED with weakness and limited ability to ambulate. Patient lives at [facility] and [facility]. PMH includes dementia, CKD stage IIIa, HTN. MD assessment includes: acute hypoxic respiratory failure due to sepsis from pneumonia, AKI, rhabdomyolysis, and hypokalemia.   OT comments  Pt seen for OT treatment on this date. Upon arrival to room pt resting in supine with blanket over his head. Pt awakes to voice and is agreeable to tx. Pt found to be incontinent of BM on arrival to room, unaware of incontinents; MAX A for pericare; pt sit/stand for pericare with RW for stability. Pt requires CGA for bed mobility with verbal/tactile cues for technique. Pt completed STS x3 from EOB for pericare and gown change post BM at bed level. Pt completed face washing and donning gown while seated on EOB, set up required. Pt making good progress toward goals, will continue to follow POC. Discharge recommendation remains appropriate.        If plan is discharge home, recommend the following:  Assistance with cooking/housework;Assist for transportation;Direct supervision/assist for medications management;Direct supervision/assist for financial management;Supervision due to cognitive status;Help with stairs or ramp for entrance;A lot of help with walking and/or transfers;A lot of help with bathing/dressing/bathroom   Equipment Recommendations  Other (comment)    Recommendations for Other Services      Precautions / Restrictions Precautions Precautions: Fall Recall of Precautions/Restrictions: Impaired Precaution/Restrictions Comments: hx of IDD/dementia Restrictions Weight Bearing Restrictions Per Provider Order: No       Mobility Bed Mobility Overal bed mobility: Needs Assistance Bed Mobility: Supine to Sit,  Sit to Supine     Supine to sit: Supervision Sit to supine: Contact guard assist (Verbal/tactlile cues for sequencing)   General bed mobility comments: Extra time required for bed mobility    Transfers Overall transfer level: Needs assistance Equipment used: Rolling walker (2 wheels) Transfers: Sit to/from Stand Sit to Stand: Contact guard assist           General transfer comment: STS x3 from EOB for pericare and gown change post BM at bed level     Balance Overall balance assessment: Needs assistance Sitting-balance support: Feet supported, Single extremity supported Sitting balance-Leahy Scale: Good     Standing balance support: Bilateral upper extremity supported, Reliant on assistive device for balance, During functional activity Standing balance-Leahy Scale: Fair Standing balance comment: Min A for inital STS, CGA for other 2 STS bouts, no LOB noted                           ADL either performed or assessed with clinical judgement   ADL Overall ADL's : Needs assistance/impaired     Grooming: Wash/dry face;Wash/dry hands;Sitting           Upper Body Dressing : Sitting;Minimal assistance           Toileting- Clothing Manipulation and Hygiene: Maximal assistance;Sit to/from stand Toileting - Clothing Manipulation Details (indicate cue type and reason): Pt found to be incontinent of BM, unaware of incontinents; MAX A for peri care; pt sit/stand for pericare.     Functional mobility during ADLs: Contact guard assist;Cueing for safety;Rolling walker (2 wheels);Cueing for sequencing General ADL Comments: Pt completed face washing and donning gown while seated on EOB, set up required    Extremity/Trunk  Assessment              Vision       Perception     Praxis     Communication Communication Communication: Impaired Factors Affecting Communication: Reduced clarity of speech   Cognition Arousal: Lethargic Behavior During Therapy: Flat  affect             Executive functioning impairment (select all impairments): Problem solving, Reasoning, Sequencing, Initiation OT - Cognition Comments: Pt a/ox4                 Following commands: Impaired Following commands impaired: Follows one step commands with increased time      Cueing   Cueing Techniques: Verbal cues, Gestural cues, Visual cues, Tactile cues  Exercises Exercises: Other exercises Other Exercises Other Exercises: Edu: Sequencing mobility and DME management, pt completed kicks while seated on EOB and marching in place during standing Other Exercises: '    Shoulder Instructions       General Comments Pt on RA throughout session, calm and willinging to participate in therapy    Pertinent Vitals/ Pain       Pain Assessment Pain Assessment: PAINAD Breathing: normal Negative Vocalization: none Facial Expression: smiling or inexpressive Body Language: relaxed Consolability: no need to console PAINAD Score: 0  Home Living                                          Prior Functioning/Environment              Frequency  Min 2X/week        Progress Toward Goals  OT Goals(current goals can now be found in the care plan section)     Acute Rehab OT Goals Patient Stated Goal: breathe better OT Goal Formulation: With patient Time For Goal Achievement: 08/16/23 Potential to Achieve Goals: Fair  Plan      Co-evaluation                 AM-PAC OT "6 Clicks" Daily Activity     Outcome Measure   Help from another person eating meals?: None Help from another person taking care of personal grooming?: A Little Help from another person toileting, which includes using toliet, bedpan, or urinal?: A Lot Help from another person bathing (including washing, rinsing, drying)?: A Lot Help from another person to put on and taking off regular upper body clothing?: A Little Help from another person to put on and taking off  regular lower body clothing?: A Lot 6 Click Score: 16    End of Session Equipment Utilized During Treatment: Gait belt;Rolling walker (2 wheels)  OT Visit Diagnosis: Other abnormalities of gait and mobility (R26.89);Repeated falls (R29.6);Muscle weakness (generalized) (M62.81)   Activity Tolerance Patient tolerated treatment well   Patient Left in bed;with call bell/phone within reach;with bed alarm set   Nurse Communication Mobility status        Time: 1610-9604 OT Time Calculation (min): 28 min  Charges: OT General Charges $OT Visit: 1 Visit OT Treatments $Self Care/Home Management : 23-37 mins  Glenard Haring M.S. OTR/L  08/12/23, 4:30 PM

## 2023-08-12 NOTE — Plan of Care (Signed)

## 2023-08-12 NOTE — TOC Progression Note (Signed)
 Transition of Care [facility]) - Progression Note    Patient Details  Name: Paul Vaughan MRN: [id number] Date of Birth: 10-13-1955  Transition of Care [facility]) CM/SW Contact  Marlowe Sax, RN Phone Number: 08/12/2023, 4:21 PM  Clinical Narrative:     Sent another secure email to DSS [facility] [place] supervisor asking for a call back or an email with a bed choicen to go to STR  Expected Discharge Plan:  (TBD) Barriers to Discharge: No Barriers Identified  Expected Discharge Plan and Services   Discharge Planning Services: CM Consult   Living arrangements for the past 2 months: Group Home (Patient from [place] and [facility].)                                       Social Determinants of Health (SDOH) Interventions SDOH Screenings   Food Insecurity: No Food Insecurity (07/29/2023)  Housing: Low Risk  (07/29/2023)  Transportation Needs: No Transportation Needs (07/29/2023)  Utilities: Not At Risk (07/29/2023)  Social Connections: Unknown (07/29/2023)  Tobacco Use: High Risk (07/29/2023)    Readmission Risk Interventions    07/01/2021    1:02 PM  Readmission Risk Prevention Plan  Transportation Screening Complete  PCP or Specialist Appt within 5-7 Days Complete  Home Care Screening Complete  Medication Review (RN CM) Complete

## 2023-08-12 NOTE — Progress Notes (Signed)
 Progress Note   Patient: Paul Vaughan WUJ:[id number] DOB: [birth year] DOA: 07/28/2023     15 DOS: the patient was seen and examined on 08/12/2023   Brief hospital course: From HPI "68 y.o. male with medical history significant of dementia, CKD stage IIIa, COPD, CHF, who presents to the ED due to generalized weakness. History obtained through chart review due to patient's underlying dementia. Caregiver previously at bedside noted patient has been experiencing increased generalized weakness with poor appetite and episodes of urinary continence with increasing altered mental status over the last few days.   ED course: On arrival to the ED, patient was hypotensive at 86/53 with heart rate of 90.  He was saturating at 88% on room air and subsequently placed on 3 L.  He was afebrile at 98.6.  Initial workup notable for WBC 12.5, BUN 64, creatinine 2.27, AST 189, ALT 76, GFR of 31.  Urinalysis with no leukocytes, nitrites or bacteria.  Chest x-ray with chronic interstitial lung changes, however increased opacities compared to prior with possible right apical pneumonia.  CT of the abdomen with no acute findings.  Patient started on IV fluids, azithromycin, ceftriaxone, DuoNebs, and Solu-Medrol.  TRH contacted for admission.  "   Hospital course as outlined below    Assessment and Plan:   * Acute hypoxic respiratory failure (HCC) Patient with a pulse ox of 88% on room air on admission, due to sepsis from pneumonia.  3/18 developed acute worsening hypoxia after aspiration episode. Transferred to stepdown and pulmonology was consulted. Has completed antibiotic course Pulmonology consulted - signed off Currently on room air Has completed a steroid taper Continue Mucomyst nebulization as needed Currently awaiting placement   Severe sepsis  Present on admission with bilateral pneumonia, hypotension, leukocytosis, tachycardia elevated lactic acid and acute hypoxic respiratory failure and acute kidney  injury.  Completed sepsis IVF.   Has completed antibiotics   Multifocal pneumonia Has completed antibiotic course   Hypokalemia - K 2.9 on 3/20 was replaced. Mg is 2.0. Monitor BMP   Acute kidney injury superimposed on CKD  AKI on CKD stage IIIa.   Creatinine 2.27 on presentation.  Normalized to baseline. Cr 0.86 (3/23) Monitor renal function   Elevated LFTs - improved Likely secondary to sepsis   Chronic obstructive pulmonary disease, unspecified Continue as needed nebulization --Pulmonology consulted, have signed off   Rhabdomyolysis Hold Crestor.  No falls at home.  PT evaluation.   Resolved with IV fluids.   Chronic diastolic CHF Currently no signs of heart failure.  Watch closely with gentle fluids.  Echo back in 2024 showed EF greater than 55%. 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 Continue Depakote, Lexapro, Seroquel   Essential (primary) hypertension Systolic BP's in 956'O to 150's.  BP meds have been on hold. Continue lisinopril 5 mg   Subjective:  Patient seen and examined at bedside this morning Patient has no acute overnight event Patient is a [employer] case TOT is working on this     Physical Exam: General exam: awake, alert, no acute distress Respiratory system: CTAB, no wheezes, rales or rhonchi, normal respiratory effort. Cardiovascular system: normal S1/S2, RRR  Gastrointestinal system: soft, NT, ND Central nervous system: no gross focal neurologic deficits, normal speech Extremities: trace pedal edema, normal tone Skin: dry, intact, normal temperature     Data Reviewed:  Latest Ref Rng & Units 08/10/2023    4:08 AM 08/09/2023    5:10 AM 08/08/2023    4:30 AM  CBC  WBC 4.0 - 10.5 K/uL 9.1  8.5  9.8   Hemoglobin 13.0 - 17.0  g/dL 16.1  09.6  04.5   Hematocrit 39.0 - 52.0 % 30.1  29.0  33.8   Platelets 150 - 400 K/uL 297  282  272        Latest Ref Rng & Units 08/10/2023    4:08 AM 08/09/2023    5:10 AM 08/08/2023    4:30 AM  BMP  Glucose 70 - 99 mg/dL 98  409  78   BUN 8 - 23 mg/dL 31  33  26   Creatinine 0.61 - 1.24 mg/dL 8.11  9.14  7.82   Sodium 135 - 145 mmol/L 134  134  136   Potassium 3.5 - 5.1 mmol/L 4.7  4.5  4.2   Chloride 98 - 111 mmol/L 99  97  98   CO2 22 - 32 mmol/L 28  28  27    Calcium 8.9 - 10.3 mg/dL 9.3  8.8  8.9       Vitals:   08/12/23 0402 08/12/23 0418 08/12/23 0711 08/12/23 1654  BP:  (!) 95/45 100/60 117/69  Pulse:   90 96  Resp:  16 18 16   Temp:  99.8 F (37.7 C) 98.3 F (36.8 C) 98.2 F (36.8 C)  TempSrc:   Oral Oral  SpO2:  92% 93% 94%  Weight: 77.3 kg     Height:         Author: Loyce Dys, MD 08/12/2023 6:21 PM  For on call review [email or web address].

## 2023-08-13 DIAGNOSIS — J9601 Acute respiratory failure with hypoxia: Secondary | ICD-10-CM | POA: Diagnosis not present

## 2023-08-13 NOTE — TOC Progression Note (Signed)
 Transition of Care [facility]) - Progression Note    Patient Details  Name: Paul Vaughan MRN: [id number] Date of Birth: 09/28/1955  Transition of Care [facility]) CM/SW Contact  Marlowe Sax, RN Phone Number: 08/13/2023, 1:54 PM  Clinical Narrative: sent secure email to legal guardian and supervisor with FL2 and clinical notes  Uploaded clinical to [facility] to obtain PASSR   Expected Discharge Plan:  (TBD) Barriers to Discharge: No Barriers Identified  Expected Discharge Plan and Services   Discharge Planning Services: CM Consult   Living arrangements for the past 2 months: Group Home (Patient from [place] and [facility].)                                       Social Determinants of Health (SDOH) Interventions SDOH Screenings   Food Insecurity: No Food Insecurity (07/29/2023)  Housing: Low Risk  (07/29/2023)  Transportation Needs: No Transportation Needs (07/29/2023)  Utilities: Not At Risk (07/29/2023)  Social Connections: Unknown (07/29/2023)  Tobacco Use: High Risk (07/29/2023)    Readmission Risk Interventions    07/01/2021    1:02 PM  Readmission Risk Prevention Plan  Transportation Screening Complete  PCP or Specialist Appt within 5-7 Days Complete  Home Care Screening Complete  Medication Review (RN CM) Complete

## 2023-08-13 NOTE — TOC Progression Note (Signed)
 Transition of Care [facility]) - Progression Note    Patient Details  Name: Paul Vaughan MRN: [id number] Date of Birth: May 23, 1955  Transition [facility] Care [facility]) CM/SW Contact  Marlowe Sax, RN Phone Number: 08/13/2023, 2:20 PM  Clinical Narrative:    Per Sandrea Hughs at DSS they accepted the bed offer to [facility] tomorrow if medially ready to [place]   Expected Discharge Plan:  (TBD) Barriers to Discharge: No Barriers Identified  Expected Discharge Plan and Services   Discharge Planning Services: CM Consult   Living arrangements for the past 2 months: Group Home (Patient from [place] and [facility].)                                       Social Determinants of Health (SDOH) Interventions SDOH Screenings   Food Insecurity: No Food Insecurity (07/29/2023)  Housing: Low Risk  (07/29/2023)  Transportation Needs: No Transportation Needs (07/29/2023)  Utilities: Not At Risk (07/29/2023)  Social Connections: Unknown (07/29/2023)  Tobacco Use: High Risk (07/29/2023)    Readmission Risk Interventions    07/01/2021    1:02 PM  Readmission Risk Prevention Plan  Transportation Screening Complete  PCP or Specialist Appt within 5-7 Days Complete  Home Care Screening Complete  Medication Review (RN CM) Complete

## 2023-08-13 NOTE — Progress Notes (Signed)
 Physical Therapy Treatment Patient Details Name: Paul Vaughan MRN: [id number] DOB: [birth year] Today's Date: 08/13/2023   History of Present Illness Patient is a 68 year old male who presented to ED with weakness and limited ability to ambulate. Patient lives at [facility] and [facility]. PMH includes dementia, CKD stage IIIa, HTN. MD assessment includes: acute hypoxic respiratory failure due to sepsis from pneumonia, AKI, rhabdomyolysis, and hypokalemia.    PT Comments  Pt with flat affect and no initiation of verbal communication but followed 1-step commands well and put forth good effort during the session. Pt required cuing for proper sequencing to assist with coming to standing from various height surfaces.  Once in standing pt required mod lean on the RW for support and was only able to take several steps near the EOB and to the chair before needing to return to sitting secondary to fatigue.  Pt reported no adverse symptoms during the session with SpO2 and HR WNL throughout on room air.  Pt will benefit from continued PT services upon discharge to safely address deficits listed in patient problem list for decreased caregiver assistance and eventual return to PLOF.       If plan is discharge home, recommend the following: A lot of help with walking and/or transfers;A little help with bathing/dressing/bathroom;Assistance with cooking/housework;Direct supervision/assist for medications management;Supervision due to cognitive status;Assist for transportation;Help with stairs or ramp for entrance   Can travel by private vehicle     Yes  Equipment Recommendations  Other (comment) (TBD)    Recommendations for Other Services       Precautions / Restrictions Precautions Precautions: Fall Recall of Precautions/Restrictions: Impaired Precaution/Restrictions Comments: hx of IDD/dementia Restrictions Weight Bearing Restrictions Per Provider Order: No     Mobility  Bed Mobility Overal  bed mobility: Needs Assistance       Supine to sit: Supervision     General bed mobility comments: Extra time and effort required along with use of the bed rail    Transfers Overall transfer level: Needs assistance Equipment used: Rolling walker (2 wheels) Transfers: Sit to/from Stand Sit to Stand: Contact guard assist           General transfer comment: Mod cues for hand placement    Ambulation/Gait Ambulation/Gait assistance: Contact guard assist Gait Distance (Feet): 4 Feet Assistive device: Rolling walker (2 wheels) Gait Pattern/deviations: Step-through pattern, Decreased step length - right, Decreased step length - left, Trunk flexed Gait velocity: decreased     General Gait Details: Slow cadence with short B step length, trunk flexed, and mod lean on the RW for support with max multi-modal cues for proper positioning relative to the chair prior to sitting   Stairs             Wheelchair Mobility     Tilt Bed    Modified Rankin (Stroke Patients Only)       Balance Overall balance assessment: Needs assistance Sitting-balance support: Feet supported, Single extremity supported Sitting balance-Leahy Scale: Good     Standing balance support: Bilateral upper extremity supported, Reliant on assistive device for balance, During functional activity Standing balance-Leahy Scale: Fair                              Hotel manager: Impaired Factors Affecting Communication: Reduced clarity of speech  Cognition Arousal: Alert Behavior During Therapy: Flat affect   PT - Cognitive impairments: History of cognitive impairments  Following commands: Impaired Following commands impaired: Follows one step commands with increased time    Cueing Cueing Techniques: Verbal cues, Visual cues, Tactile cues  Exercises Total Joint Exercises Ankle Circles/Pumps: AROM, Strengthening, Both, 10  reps Towel Squeeze: Strengthening, Both, 10 reps Hip ABduction/ADduction: AAROM, Strengthening, Both, 5 reps Straight Leg Raises: AAROM, Strengthening, Both, 5 reps Long Arc Quad: AROM, Strengthening, Both, 10 reps Knee Flexion: AROM, Strengthening, Both, 10 reps    General Comments        Pertinent Vitals/Pain Pain Assessment Pain Assessment: No/denies pain    Home Living                          Prior Function            PT Goals (current goals can now be found in the care plan section) Progress towards PT goals: PT to reassess next treatment    Frequency    Min 2X/week      PT Plan      Co-evaluation              AM-PAC PT "6 Clicks" Mobility   Outcome Measure  Help needed turning from your back to your side while in a flat bed without using bedrails?: A Little Help needed moving from lying on your back to sitting on the side of a flat bed without using bedrails?: A Little Help needed moving to and from a bed to a chair (including a wheelchair)?: A Little Help needed standing up from a chair using your arms (e.g., wheelchair or bedside chair)?: A Little Help needed to walk in hospital room?: A Lot Help needed climbing 3-5 steps with a railing? : Total 6 Click Score: 15    End of Session Equipment Utilized During Treatment: Gait belt Activity Tolerance: Patient tolerated treatment well Patient left: in chair;with chair alarm set;with call bell/phone within reach Nurse Communication: Mobility status PT Visit Diagnosis: Muscle weakness (generalized) (M62.81);Other abnormalities of gait and mobility (R26.89)     Time: 6045-4098 PT Time Calculation (min) (ACUTE ONLY): 18 min  Charges:    $Therapeutic Exercise: 8-22 mins PT General Charges $$ ACUTE PT VISIT: 1 Visit                     D. Elly Modena PT, DPT 08/13/23, 12:33 PM

## 2023-08-13 NOTE — Plan of Care (Signed)
  Problem: Fluid Volume: Goal: Hemodynamic stability will improve Outcome: Progressing   Problem: Clinical Measurements: Goal: Diagnostic test results will improve Outcome: Progressing   Problem: Clinical Measurements: Goal: Ability to maintain clinical measurements within normal limits will improve Outcome: Progressing   Problem: Safety: Goal: Ability to remain free from injury will improve Outcome: Progressing   Problem: Skin Integrity: Goal: Risk for impaired skin integrity will decrease Outcome: Progressing

## 2023-08-13 NOTE — Plan of Care (Signed)

## 2023-08-13 NOTE — Progress Notes (Signed)
 Progress Note   Patient: Paul Vaughan ZOX:[id number] DOB: [birth year] DOA: 07/28/2023     16 DOS: the patient was seen and examined on 08/13/2023   Brief hospital course: From HPI "68 y.o. male with medical history significant of dementia, CKD stage IIIa, COPD, CHF, who presents to the ED due to generalized weakness. History obtained through chart review due to patient's underlying dementia. Caregiver previously at bedside noted patient has been experiencing increased generalized weakness with poor appetite and episodes of urinary continence with increasing altered mental status over the last few days.   ED course: On arrival to the ED, patient was hypotensive at 86/53 with heart rate of 90.  He was saturating at 88% on room air and subsequently placed on 3 L.  He was afebrile at 98.6.  Initial workup notable for WBC 12.5, BUN 64, creatinine 2.27, AST 189, ALT 76, GFR of 31.  Urinalysis with no leukocytes, nitrites or bacteria.  Chest x-ray with chronic interstitial lung changes, however increased opacities compared to prior with possible right apical pneumonia.  CT of the abdomen with no acute findings.  Patient started on IV fluids, azithromycin, ceftriaxone, DuoNebs, and Solu-Medrol.  TRH contacted for admission.  "   Hospital course as outlined below     Assessment and Plan:   * Acute hypoxic respiratory failure (HCC) Patient with a pulse ox of 88% on room air on admission, due to sepsis from pneumonia.  3/18 developed acute worsening hypoxia after aspiration episode. Transferred to stepdown and pulmonology was consulted. Has completed antibiotic course Pulmonology consulted - signed off Currently on room air Has completed a steroid taper Continue Mucomyst nebulization as needed Currently awaiting placement   Severe sepsis  Present on admission with bilateral pneumonia, hypotension, leukocytosis, tachycardia elevated lactic acid and acute hypoxic respiratory failure and acute kidney  injury.  Completed sepsis IVF.   Has completed antibiotics   Multifocal pneumonia Has completed antibiotic course   Hypokalemia - K 2.9 on 3/20 was replaced. Mg is 2.0. Monitor BMP   Acute kidney injury superimposed on CKD  AKI on CKD stage IIIa.   Creatinine 2.27 on presentation.  Normalized to baseline. Cr 0.86 (3/23) Monitor renal function   Elevated LFTs - improved Likely secondary to sepsis   Chronic obstructive pulmonary disease, unspecified Continue as needed nebulization --Pulmonology consulted, have signed off   Rhabdomyolysis Hold Crestor.  No falls at home.  PT evaluation.   Resolved with IV fluids.   Chronic diastolic CHF Currently no signs of heart failure.  Watch closely with gentle fluids.  Echo back in 2024 showed EF greater than 55%. 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 Continue Depakote, Lexapro, Seroquel   Essential (primary) hypertension Systolic BP's in 811'B to 150's.  BP meds have been on hold. Continue lisinopril 5 mg   Subjective:  Patient seen and examined at bedside this morning Patient has no acute overnight event Patient is a [employer] case According to transition of care manager patient has been accepted for [facility]     Physical Exam: General exam: awake, alert, no acute distress Respiratory system: CTAB, no wheezes, rales or rhonchi, normal respiratory effort. Cardiovascular system: normal S1/S2, RRR  Gastrointestinal system: soft, NT, ND Central nervous system: no gross focal neurologic deficits, normal speech Extremities: trace pedal edema, normal tone  Skin: dry, intact, normal temperature     Data Reviewed:   Vitals:   08/12/23 1930 08/13/23 0429 08/13/23 0500 08/13/23 0705  BP: 95/64 107/60   116/68  Pulse: 100 (!) 101  95  Resp: 16 16  18   Temp: 99.2 F (37.3 C) (!) 97.3 F (36.3 C)  98.1 F (36.7 C)  TempSrc:    Oral  SpO2: 94% 91%  96%  Weight:   75.5 kg   Height:          Latest Ref Rng & Units 08/10/2023    4:08 AM 08/09/2023    5:10 AM 08/08/2023    4:30 AM  BMP  Glucose 70 - 99 mg/dL 98  865  78   BUN 8 - 23 mg/dL 31  33  26   Creatinine 0.61 - 1.24 mg/dL 7.84  6.96  2.95   Sodium 135 - 145 mmol/L 134  134  136   Potassium 3.5 - 5.1 mmol/L 4.7  4.5  4.2   Chloride 98 - 111 mmol/L 99  97  98   CO2 22 - 32 mmol/L 28  28  27    Calcium 8.9 - 10.3 mg/dL 9.3  8.8  8.9      Time spent: 38 minutes  Author: Loyce Dys, MD 08/13/2023 5:46 PM  For on call review [email or web address].

## 2023-08-14 DIAGNOSIS — J9601 Acute respiratory failure with hypoxia: Secondary | ICD-10-CM | POA: Diagnosis not present

## 2023-08-14 LAB — CBC WITH DIFFERENTIAL/PLATELET
Abs Immature Granulocytes: 0.09 10*3/uL — ABNORMAL HIGH (ref 0.00–0.07)
Basophils Absolute: 0 10*3/uL (ref 0.0–0.1)
Basophils Relative: 0 %
Eosinophils Absolute: 0.1 10*3/uL (ref 0.0–0.5)
Eosinophils Relative: 1 %
HCT: 30.7 % — ABNORMAL LOW (ref 39.0–52.0)
Hemoglobin: 10.6 g/dL — ABNORMAL LOW (ref 13.0–17.0)
Immature Granulocytes: 1 %
Lymphocytes Relative: 31 %
Lymphs Abs: 2 10*3/uL (ref 0.7–4.0)
MCH: 32.3 pg (ref 26.0–34.0)
MCHC: 34.5 g/dL (ref 30.0–36.0)
MCV: 93.6 fL (ref 80.0–100.0)
Monocytes Absolute: 0.6 10*3/uL (ref 0.1–1.0)
Monocytes Relative: 10 %
Neutro Abs: 3.5 10*3/uL (ref 1.7–7.7)
Neutrophils Relative %: 57 %
Platelets: 286 10*3/uL (ref 150–400)
RBC: 3.28 MIL/uL — ABNORMAL LOW (ref 4.22–5.81)
RDW: 16.1 % — ABNORMAL HIGH (ref 11.5–15.5)
WBC: 6.3 10*3/uL (ref 4.0–10.5)
nRBC: 0 % (ref 0.0–0.2)

## 2023-08-14 LAB — OSMOLALITY, URINE: Osmolality, Ur: 631 mosm/kg (ref 300–900)

## 2023-08-14 LAB — BASIC METABOLIC PANEL WITH GFR
Anion gap: 9 (ref 5–15)
BUN: 27 mg/dL — ABNORMAL HIGH (ref 8–23)
CO2: 23 mmol/L (ref 22–32)
Calcium: 8.7 mg/dL — ABNORMAL LOW (ref 8.9–10.3)
Chloride: 93 mmol/L — ABNORMAL LOW (ref 98–111)
Creatinine, Ser: 0.88 mg/dL (ref 0.61–1.24)
GFR, Estimated: >60 mL/min (ref >60)
Glucose, Bld: 87 mg/dL (ref 70–99)
Potassium: 4.6 mmol/L (ref 3.5–5.1)
Sodium: 125 mmol/L — ABNORMAL LOW (ref 135–145)

## 2023-08-14 LAB — SODIUM, URINE, RANDOM: Sodium, Ur: 141 mmol/L

## 2023-08-14 LAB — OSMOLALITY: Osmolality: 281 mosm/kg (ref 275–295)

## 2023-08-14 MED ORDER — SODIUM CHLORIDE 0.9 % IV BOLUS
1000.0000 mL | Freq: Once | INTRAVENOUS | Status: AC
  Administered 2023-08-14: 1000 mL via INTRAVENOUS

## 2023-08-14 NOTE — Progress Notes (Signed)
 PT Cancellation Note  Patient Details Name: Pritesh Sobecki MRN: [id number] DOB: 1956/03/06   Cancelled Treatment:     PT attempt 2 x this date. First attempt, pt asleep upon arrival and quickly falls back to sleep. Second attempt, pt still sound asleep and did not want to participate at this time. Acute PT will return tomorrow and continue to follow per current POC.    Paul Vaughan 08/14/2023, 4:12 PM

## 2023-08-14 NOTE — Plan of Care (Signed)

## 2023-08-14 NOTE — Plan of Care (Signed)

## 2023-08-14 NOTE — TOC Progression Note (Signed)
 Transition of Care [facility]) - Progression Note    Patient Details  Name: Paul Vaughan MRN: [id number] Date of Birth: [birth year]  [facility]) CM/SW Contact  Marlowe Sax, RN Phone Number: 08/14/2023, 10:13 AM  Clinical Narrative:     PASSR still pending level 2  Expected Discharge Plan:  (TBD) Barriers to Discharge: No Barriers Identified  Expected Discharge Plan and Services   Discharge Planning Services: CM Consult   Living arrangements for the past 2 months: Group Home (Patient from [place] and [facility].)                                       Social Determinants of Health (SDOH) Interventions SDOH Screenings   Food Insecurity: No Food Insecurity (07/29/2023)  Housing: Low Risk  (07/29/2023)  Transportation Needs: No Transportation Needs (07/29/2023)  Utilities: Not At Risk (07/29/2023)  Social Connections: Unknown (07/29/2023)  Tobacco Use: High Risk (07/29/2023)    Readmission Risk Interventions    07/01/2021    1:02 PM  Readmission Risk Prevention Plan  Transportation Screening Complete  PCP or Specialist Appt within 5-7 Days Complete  Home Care Screening Complete  Medication Review (RN CM) Complete

## 2023-08-14 NOTE — Progress Notes (Signed)
 Progress Note   Patient: Paul Vaughan ZOX:[id number] DOB: 02/26/1956 DOA: 07/28/2023     17 DOS: the patient was seen and examined on 08/14/2023      Brief hospital course: From HPI "68 y.o. male with medical history significant of dementia, CKD stage IIIa, COPD, CHF, who presents to the ED due to generalized weakness. History obtained through chart review due to patient's underlying dementia. Caregiver previously at bedside noted patient has been experiencing increased generalized weakness with poor appetite and episodes of urinary continence with increasing altered mental status over the last few days.   ED course: On arrival to the ED, patient was hypotensive at 86/53 with heart rate of 90.  He was saturating at 88% on room air and subsequently placed on 3 L.  He was afebrile at 98.6.  Initial workup notable for WBC 12.5, BUN 64, creatinine 2.27, AST 189, ALT 76, GFR of 31.  Urinalysis with no leukocytes, nitrites or bacteria.  Chest x-ray with chronic interstitial lung changes, however increased opacities compared to prior with possible right apical pneumonia.  CT of the abdomen with no acute findings.  Patient started on IV fluids, azithromycin, ceftriaxone, DuoNebs, and Solu-Medrol.  TRH contacted for admission.  "   Hospital course as outlined below     Assessment and Plan:   * Acute hypoxic respiratory failure (HCC) Patient with a pulse ox of 88% on room air on admission, due to sepsis from pneumonia.  3/18 developed acute worsening hypoxia after aspiration episode. Transferred to stepdown and pulmonology was consulted. Has completed antibiotic course Pulmonology consulted - signed off Currently on room air Has completed a steroid taper Continue Mucomyst nebulization as needed Currently awaiting placement   Hyponatremia likely secondary to hypovolemia due to poor oral intake Patient noted to have sodium of 125  We will give IV fluid Urine electrolytes ordered  Severe sepsis   Present on admission with bilateral pneumonia, hypotension, leukocytosis, tachycardia elevated lactic acid and acute hypoxic respiratory failure and acute kidney injury.  Completed sepsis IVF.   Has completed antibiotics   Multifocal pneumonia Has completed antibiotic course   Hypokalemia - K 2.9 on 3/20 was replaced. Mg is 2.0. Monitor BMP   Acute kidney injury superimposed on CKD  AKI on CKD stage IIIa.   Creatinine 2.27 on presentation.  Normalized to baseline. Cr 0.86 (3/23) Monitor renal function   Elevated LFTs - improved Likely secondary to sepsis   Chronic obstructive pulmonary disease, unspecified Continue as needed nebulization --Pulmonology consulted, have signed off   Rhabdomyolysis Hold Crestor.  No falls at home.  PT evaluation.   Resolved with IV fluids.   Chronic diastolic CHF Currently no signs of heart failure.  Watch closely with gentle fluids.  Echo back in 2024 showed EF greater than 55%. 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 Continue Depakote, Lexapro, Seroquel   Essential (primary) hypertension Systolic BP's in 811'B to 150's.  BP meds have been on hold. Continue lisinopril 5 mg   Subjective:  Patient seen and examined at bedside TOC still working on placement Sodium level noted to be low and workup initiated Denies nausea vomiting abdominal pain     Physical Exam: General exam: awake, alert, no acute distress Respiratory system: CTAB, no wheezes, rales or rhonchi, normal respiratory effort. Cardiovascular system: normal S1/S2, RRR  Gastrointestinal system: soft,  NT, ND Central nervous system: no gross focal neurologic deficits, normal speech Extremities: trace pedal edema, normal tone Skin: dry, intact, normal temperature    Disposition: Pending  DSS as well as placement availability   Data Reviewed:      Latest Ref Rng & Units 08/14/2023    1:59 AM 08/10/2023    4:08 AM 08/09/2023    5:10 AM  BMP  Glucose 70 - 99 mg/dL 87  98  161   BUN 8 - 23 mg/dL 27  31  33   Creatinine 0.61 - 1.24 mg/dL 0.96  0.45  4.09   Sodium 135 - 145 mmol/L 125  134  134   Potassium 3.5 - 5.1 mmol/L 4.6  4.7  4.5   Chloride 98 - 111 mmol/L 93  99  97   CO2 22 - 32 mmol/L 23  28  28    Calcium 8.9 - 10.3 mg/dL 8.7  9.3  8.8      Vitals:   08/13/23 2045 08/14/23 0354 08/14/23 0500 08/14/23 0823  BP: 108/64 102/62  114/71  Pulse: (!) 109 99  98  Resp: 16 16  17   Temp: 98.6 F (37 C) 98.5 F (36.9 C)  98.1 F (36.7 C)  TempSrc:    Oral  SpO2: 95% 93%  91%  Weight:   75.8 kg   Height:          Latest Ref Rng & Units 08/14/2023    1:59 AM 08/10/2023    4:08 AM 08/09/2023    5:10 AM  CBC  WBC 4.0 - 10.5 K/uL 6.3  9.1  8.5   Hemoglobin 13.0 - 17.0 g/dL 81.1  91.4  78.2   Hematocrit 39.0 - 52.0 % 30.7  30.1  29.0   Platelets 150 - 400 K/uL 286  297  282      Author: Loyce Dys, MD 08/14/2023 12:09 PM  For on call review [email or web address].

## 2023-08-15 ENCOUNTER — Inpatient Hospital Stay

## 2023-08-15 DIAGNOSIS — J9601 Acute respiratory failure with hypoxia: Secondary | ICD-10-CM | POA: Diagnosis not present

## 2023-08-15 LAB — CBC WITH DIFFERENTIAL/PLATELET
Abs Immature Granulocytes: 0.09 10*3/uL — ABNORMAL HIGH (ref 0.00–0.07)
Basophils Absolute: 0 10*3/uL (ref 0.0–0.1)
Basophils Relative: 1 %
Eosinophils Absolute: 0.1 10*3/uL (ref 0.0–0.5)
Eosinophils Relative: 2 %
HCT: 31.4 % — ABNORMAL LOW (ref 39.0–52.0)
Hemoglobin: 10.5 g/dL — ABNORMAL LOW (ref 13.0–17.0)
Immature Granulocytes: 1 %
Lymphocytes Relative: 25 %
Lymphs Abs: 1.5 10*3/uL (ref 0.7–4.0)
MCH: 32.7 pg (ref 26.0–34.0)
MCHC: 33.4 g/dL (ref 30.0–36.0)
MCV: 97.8 fL (ref 80.0–100.0)
Monocytes Absolute: 0.7 10*3/uL (ref 0.1–1.0)
Monocytes Relative: 12 %
Neutro Abs: 3.7 10*3/uL (ref 1.7–7.7)
Neutrophils Relative %: 59 %
Platelets: 281 10*3/uL (ref 150–400)
RBC: 3.21 MIL/uL — ABNORMAL LOW (ref 4.22–5.81)
RDW: 15.9 % — ABNORMAL HIGH (ref 11.5–15.5)
WBC: 6.2 10*3/uL (ref 4.0–10.5)
nRBC: 0 % (ref 0.0–0.2)

## 2023-08-15 LAB — BASIC METABOLIC PANEL WITH GFR
Anion gap: 9 (ref 5–15)
BUN: 26 mg/dL — ABNORMAL HIGH (ref 8–23)
CO2: 22 mmol/L (ref 22–32)
Calcium: 8.7 mg/dL — ABNORMAL LOW (ref 8.9–10.3)
Chloride: 99 mmol/L (ref 98–111)
Creatinine, Ser: 1.06 mg/dL (ref 0.61–1.24)
GFR, Estimated: >60 mL/min (ref >60)
Glucose, Bld: 106 mg/dL — ABNORMAL HIGH (ref 70–99)
Potassium: 4.4 mmol/L (ref 3.5–5.1)
Sodium: 130 mmol/L — ABNORMAL LOW (ref 135–145)

## 2023-08-15 MED ORDER — SODIUM CHLORIDE 0.9 % IV BOLUS
1000.0000 mL | Freq: Once | INTRAVENOUS | Status: AC
  Administered 2023-08-15: 1000 mL via INTRAVENOUS

## 2023-08-15 MED ORDER — SODIUM CHLORIDE 0.9 % IV SOLN
500.0000 mg | INTRAVENOUS | Status: DC
  Administered 2023-08-15 – 2023-08-16 (×2): 500 mg via INTRAVENOUS
  Filled 2023-08-15 (×3): qty 5

## 2023-08-15 MED ORDER — SODIUM CHLORIDE 0.9 % IV SOLN
2.0000 g | INTRAVENOUS | Status: DC
  Administered 2023-08-15 – 2023-08-18 (×4): 2 g via INTRAVENOUS
  Filled 2023-08-15 (×5): qty 20

## 2023-08-15 NOTE — Plan of Care (Signed)
  Problem: Fluid Volume: Goal: Hemodynamic stability will improve Outcome: Progressing   Problem: Clinical Measurements: Goal: Diagnostic test results will improve Outcome: Progressing Goal: Signs and symptoms of infection will decrease Outcome: Progressing   Problem: Respiratory: Goal: Ability to maintain adequate ventilation will improve Outcome: Progressing   Problem: Education: Goal: Knowledge of General Education information will improve Description: Including pain rating scale, medication(s)/side effects and non-pharmacologic comfort measures Outcome: Not Progressing   Problem: Health Behavior/Discharge Planning: Goal: Ability to manage health-related needs will improve Outcome: Not Progressing   Problem: Clinical Measurements: Goal: Ability to maintain clinical measurements within normal limits will improve Outcome: Progressing Goal: Will remain free from infection Outcome: Progressing Goal: Diagnostic test results will improve Outcome: Progressing Goal: Respiratory complications will improve Outcome: Progressing Goal: Cardiovascular complication will be avoided Outcome: Progressing   Problem: Activity: Goal: Risk for activity intolerance will decrease Outcome: Progressing   Problem: Nutrition: Goal: Adequate nutrition will be maintained Outcome: Progressing   Problem: Coping: Goal: Level of anxiety will decrease Outcome: Progressing   Problem: Elimination: Goal: Will not experience complications related to bowel motility Outcome: Progressing Goal: Will not experience complications related to urinary retention Outcome: Progressing   Problem: Pain Managment: Goal: General experience of comfort will improve and/or be controlled Outcome: Progressing   Problem: Safety: Goal: Ability to remain free from injury will improve Outcome: Progressing   Problem: Skin Integrity: Goal: Risk for impaired skin integrity will decrease Outcome: Progressing

## 2023-08-15 NOTE — TOC Progression Note (Addendum)
 Transition of Care [facility]) - Progression Note    Patient Details  Name: Paul Vaughan MRN: [id number] Date of Birth: 1955-08-22  Transition [facility] Care [facility]) CM/SW Contact  Marlowe Sax, RN Phone Number: 08/15/2023, 12:44 PM  Clinical Narrative:     PASSR number [id number] E, reached out to [name] to inquire if he can come there tomorrow awaiting response  Expected Discharge Plan:  (TBD) Barriers to Discharge: No Barriers Identified  Expected Discharge Plan and Services   Discharge Planning Services: CM Consult   Living arrangements for the past 2 months: Group Home (Patient from [place] and [facility].)                                       Social Determinants of Health (SDOH) Interventions SDOH Screenings   Food Insecurity: No Food Insecurity (07/29/2023)  Housing: Low Risk  (07/29/2023)  Transportation Needs: No Transportation Needs (07/29/2023)  Utilities: Not At Risk (07/29/2023)  Social Connections: Unknown (07/29/2023)  Tobacco Use: High Risk (07/29/2023)    Readmission Risk Interventions    07/01/2021    1:02 PM  Readmission Risk Prevention Plan  Transportation Screening Complete  PCP or Specialist Appt within 5-7 Days Complete  Home Care Screening Complete  Medication Review (RN CM) Complete

## 2023-08-15 NOTE — Progress Notes (Addendum)
       CROSS COVER NOTE  NAME: Paul Vaughan MRN: [id number] DOB : Apr 17, 1956 ATTENDING PHYSICIAN: Loyce Dys, MD    Date of Service   08/15/2023   HPI/Events of Note   Nurse informed me of yellow MEWS due to yellow MEWS secondary to fever and tachycardia Patient admitted with acute resp failure with hypoxia due to bilateral pneumonia of which antiviotics were completed. Today chest xray shows RUL  pneumonia and atelectasis . White count normal Last viral screen 3/16 was negative covid flu and RSV  Interventions   Assessment/Plan:    08/15/2023   11:30 PM 08/15/2023    9:22 PM 08/15/2023    8:28 PM  Vitals with BMI  Systolic 148 150 782  Diastolic 71 91 77  Pulse 121 128 130   Course rhonchi RUL, diminished BLE; sate 91% on room air Temp now 100.3 post acetaminophen Aspiration is likely given location and patient has underlying dysphagia Meets SIRS criteria IV ibuprofen x 1 dos Resp panel Npo Speech therapy for swallow eval Currently on rocephin + azithromycin, will add metronidazole Blood cultures, ua with reflex micro and lactic acid  With drop in blood pressure sepsis bundle recommended fluids ordered 30 ml/kg       Donnie Mesa NP [facility] 7pm-7am - check amion for availability Pager [phone]

## 2023-08-15 NOTE — Progress Notes (Signed)
 Progress Note   Patient: Paul Vaughan ZOX:[id number] DOB: Dec 25, 1955 DOA: 07/28/2023     18 DOS: the patient was seen and examined on 08/15/2023    Brief hospital course: From HPI "68 y.o. male with medical history significant of dementia, CKD stage IIIa, COPD, CHF, who presents to the ED due to generalized weakness. History obtained through chart review due to patient's underlying dementia. Caregiver previously at bedside noted patient has been experiencing increased generalized weakness with poor appetite and episodes of urinary continence with increasing altered mental status over the last few days.   ED course: On arrival to the ED, patient was hypotensive at 86/53 with heart rate of 90.  He was saturating at 88% on room air and subsequently placed on 3 L.  He was afebrile at 98.6.  Initial workup notable for WBC 12.5, BUN 64, creatinine 2.27, AST 189, ALT 76, GFR of 31.  Urinalysis with no leukocytes, nitrites or bacteria.  Chest x-ray with chronic interstitial lung changes, however increased opacities compared to prior with possible right apical pneumonia.  CT of the abdomen with no acute findings.  Patient started on IV fluids, azithromycin, ceftriaxone, DuoNebs, and Solu-Medrol.  TRH contacted for admission.  "   Hospital course as outlined below     Assessment and Plan:   * Acute hypoxic respiratory failure (HCC) Patient with a pulse ox of 88% on room air on admission, due to sepsis from pneumonia.  3/18 developed acute worsening hypoxia after aspiration episode. Transferred to stepdown and pulmonology was consulted. Pulmonology consulted - signed off Patient has been initiated on antibiotic therapy given new infiltrate on the right upper lobe Has completed a steroid taper Continue Mucomyst nebulization as needed Currently awaiting placement   Hyponatremia likely secondary to hypovolemia due to poor oral intake-improved Patient noted to have sodium of 125 on 08/13/2020  with Continue IV fluid Urine electrolytes ordered   Severe sepsis  Present on admission with bilateral pneumonia, hypotension, leukocytosis, tachycardia elevated lactic acid and acute hypoxic respiratory failure and acute kidney injury.  Completed sepsis IVF.   Continue current antibiotics   Multifocal pneumonia Continue current antibiotics   Hypokalemia - K 2.9 on 3/20 was replaced. Mg is 2.0. Monitor BMP   Acute kidney injury superimposed on CKD  AKI on CKD stage IIIa.   Creatinine 2.27 on presentation.  Normalized to baseline. Cr 0.86 (3/23) Monitor renal function   Elevated LFTs - improved Likely secondary to sepsis   Chronic obstructive pulmonary disease, unspecified Continue as needed nebulization --Pulmonology consulted, have signed off   Rhabdomyolysis Hold Crestor.  No falls at home.  PT evaluation.   Resolved with IV fluids.   Chronic diastolic CHF Currently no signs of heart failure.  Watch closely with gentle fluids.  Echo back in 2024 showed EF greater than 55%. 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 Continue Depakote, Lexapro, Seroquel   Essential (primary) hypertension Systolic BP's in 811'B to 150's.  BP meds have been on hold. Continue lisinopril 5 mg   Subjective:  This morning patient's blood pressure became soft and was found to be saturating in the 80s and subsequently placed on 2 L of intranasal oxygen IV fluid bolus initiated Chest pain nausea vomiting or abdominal pain Chest x-ray obtained showing new infiltrate on the right     Physical Exam: General exam:  awake, alert, no acute distress Respiratory system: CTAB, no wheezes, rales or rhonchi, normal respiratory effort. Cardiovascular system: normal S1/S2, RRR  Gastrointestinal system: soft, NT,  ND Central nervous system: no gross focal neurologic deficits, normal speech Extremities: trace pedal edema, normal tone Skin: dry, intact, normal temperature     Disposition: Pending DSS as well as placement availability   Data Reviewed:  Vitals:   08/15/23 0800 08/15/23 0805 08/15/23 0816 08/15/23 0957  BP:   (!) 86/52 (!) 78/53  Pulse:    (!) 102  Resp:   16 17  Temp:   98.4 F (36.9 C) 98.3 F (36.8 C)  TempSrc:    Oral  SpO2: (!) 84% 96% 97% 96%  Weight:      Height:          Latest Ref Rng & Units 08/15/2023    9:26 AM 08/14/2023    1:59 AM 08/10/2023    4:08 AM  CBC  WBC 4.0 - 10.5 K/uL 6.2  6.3  9.1   Hemoglobin 13.0 - 17.0 g/dL 16.1  09.6  04.5   Hematocrit 39.0 - 52.0 % 31.4  30.7  30.1   Platelets 150 - 400 K/uL 281  286  297        Latest Ref Rng & Units 08/15/2023    9:26 AM 08/14/2023    1:59 AM 08/10/2023    4:08 AM  BMP  Glucose 70 - 99 mg/dL 409  87  98   BUN 8 - 23 mg/dL 26  27  31    Creatinine 0.61 - 1.24 mg/dL 8.11  9.14  7.82   Sodium 135 - 145 mmol/L 130  125  134   Potassium 3.5 - 5.1 mmol/L 4.4  4.6  4.7   Chloride 98 - 111 mmol/L 99  93  99   CO2 22 - 32 mmol/L 22  23  28    Calcium 8.9 - 10.3 mg/dL 8.7  8.7  9.3      Author: Loyce Dys, MD 08/15/2023 12:37 PM  For on call review [email or web address].

## 2023-08-15 NOTE — Plan of Care (Signed)

## 2023-08-15 NOTE — Progress Notes (Incomplete)
       CROSS COVER NOTE  NAME: Paul Vaughan MRN: [id number] DOB : 08-27-1955 ATTENDING PHYSICIAN: Loyce Dys, MD    Date of Service   08/15/2023   HPI/Events of Note   Nurse informed me of yellow MEWS due to yellow MEWS secondary to fever and tachycardia Patient admitted with acute resp failure with hypoxia due to bilateral pneumonia of which antiviotics were completed. Today chest xray shows RUL  pneumonia and atelectasis sats. White count normal Last viral screen 3/16 was negative covid flu and RSV  Interventions   Assessment/Plan:    08/15/2023   11:30 PM 08/15/2023    9:22 PM 08/15/2023    8:28 PM  Vitals with BMI  Systolic 148 150 409  Diastolic 71 91 77  Pulse 121 128 130   Course rhonchi RUL Temp now 100.3 post acetaminophen X X X

## 2023-08-15 NOTE — Progress Notes (Signed)
 The patient is currently in yellow MEWS due to tachycardia (HR 128 bpm) and tachypnea (RR 24 breaths/min), with all other vital signs remaining stable. The patient is also experiencing rigors. The hospitalist on call, Manuela Schwartz, NP, and charge RN Marlene Lard have been notified. A recent chest X-ray confirmed pneumonia (PNA). The patient is receiving IV antibiotics, and Tylenol has been administered. Yellow MEWS protocol has been initiated. The patient will be closely monitored and assessed for potential medical interventions or escalation of care as needed    08/15/23 2028  Assess: MEWS Score  Temp 98.7 F (37.1 C)  BP 128/77  MAP (mmHg) 93  Pulse Rate (!) 130  Resp 20  Level of Consciousness Alert  SpO2 95 %  O2 Device Nasal Cannula  O2 Flow Rate (L/min) 2 L/min  Assess: MEWS Score  MEWS Temp 0  MEWS Systolic 0  MEWS Pulse 3  MEWS RR 0  MEWS LOC 0  MEWS Score 3  MEWS Score Color Yellow  Assess: if the MEWS score is Yellow or Red  Were vital signs accurate and taken at a resting state? Yes  Does the patient meet 2 or more of the SIRS criteria? Yes  Does the patient have a confirmed or suspected source of infection? Yes  MEWS guidelines implemented  Yes, yellow  Treat  MEWS Interventions Considered administering scheduled or prn medications/treatments as ordered  Take Vital Signs  Increase Vital Sign Frequency  Yellow: Q2hr x1, continue Q4hrs until patient remains green for 12hrs  Escalate  MEWS: Escalate Yellow: Discuss with charge nurse and consider notifying provider and/or RRT  Notify: Charge Nurse/RN  Name of Charge Nurse/RN Notified Marlene Lard  Provider Notification  Provider Name/Title Manuela Schwartz, NP  Date Provider Notified 08/15/23  Time Provider Notified 2115  Method of Notification Page  Notification Reason Other (Comment) (Yellow MEWS)  Provider response No new orders  Date of Provider Response 08/15/23  Notify: Rapid Response  Name of Rapid  Response RN Notified  (No need for Rapid RN at this time. Pt is stable, monitored closely, and medical provider is aware)  Assess: SIRS CRITERIA  SIRS Temperature  0  SIRS Respirations  0  SIRS Pulse 1  SIRS WBC 0  SIRS Score Sum  1

## 2023-08-15 NOTE — Progress Notes (Signed)
 Occupational Therapy Treatment Patient Details Name: Paul Vaughan MRN: [id number] DOB: 1956-02-17 Today's Date: 08/15/2023   History of present illness Patient is a 68 year old male who presented to ED with weakness and limited ability to ambulate. Patient lives at [facility] and [facility]. PMH includes dementia, CKD stage IIIa, HTN. MD assessment includes: acute hypoxic respiratory failure due to sepsis from pneumonia, AKI, rhabdomyolysis, and hypokalemia.   OT comments  Pt seen for OT treatment on this date. Upon arrival to room pt supine in bed with RN in room, agreeable to tx. Pt completed face washing while seated on EOB, MODA for task completion. Pt tolerated STS with RW from EOB requiring 2 attemps to come for full stance with MODA on second bout. Pt took 4 lateral steps towards the [facility], before visibly fatigued and needing to sit on EOB. Pt on 2L oxygen via [place], 95% throughout session. Pt retired back in bed with RN at bedside. Pt benefits from simple instructions to sequence movements required for mobility. Pt making good progress toward goals, will continue to follow POC. Discharge recommendation remains appropriate.        If plan is discharge home, recommend the following:  Assistance with cooking/housework;Assist for transportation;Direct supervision/assist for medications management;Direct supervision/assist for financial management;Supervision due to cognitive status;Help with stairs or ramp for entrance;A lot of help with walking and/or transfers;A lot of help with bathing/dressing/bathroom   Equipment Recommendations  Other (comment)    Recommendations for Other Services      Precautions / Restrictions Precautions Precautions: Fall Recall of Precautions/Restrictions: Impaired Precaution/Restrictions Comments: hx of IDD/dementia Restrictions Weight Bearing Restrictions Per Provider Order: No       Mobility Bed Mobility Overal bed mobility: Needs Assistance Bed  Mobility: Supine to Sit, Sit to Supine     Supine to sit: Min assist Sit to supine: Min assist   General bed mobility comments: Verbal/tactile cues throughout for sequencing, MINA for LE management during bed mobility.    Transfers Overall transfer level: Needs assistance Equipment used: Rolling walker (2 wheels) Transfers: Sit to/from Stand, Bed to chair/wheelchair/BSC Sit to Stand: Mod assist           General transfer comment: Pt tolerated STS from EOB requiring 2 attemps to come for full stance with MODA on second bout. Pt took 4 lateral steps towards the [facility], before visibly fatigued and needing to sit on EOB.     Balance Overall balance assessment: Needs assistance Sitting-balance support: Feet supported Sitting balance-Leahy Scale: Good     Standing balance support: Bilateral upper extremity supported, During functional activity, Reliant on assistive device for balance Standing balance-Leahy Scale: Poor                             ADL either performed or assessed with clinical judgement   ADL Overall ADL's : Needs assistance/impaired Eating/Feeding: Bed level;Sitting;Set up   Grooming: Wash/dry face;Moderate assistance;Sitting               Lower Body Dressing: Maximal assistance (Adjusting socks while seated on EOB)               Functional mobility during ADLs: Moderate assistance;Rolling walker (2 wheels);Cueing for safety;Cueing for sequencing General ADL Comments: Pt completed face washing while seated on EOB, set up required, MODA for task completion of face washing    Communication Communication Communication: Impaired Factors Affecting Communication: Other (comment)   Cognition Arousal: Alert  Behavior During Therapy: Flat affect Cognition: No family/caregiver present to determine baseline, Cognition impaired   Orientation impairments: Situation     Attention impairment (select first level of impairment): Sustained  attention Executive functioning impairment (select all impairments): Problem solving, Reasoning, Sequencing, Initiation OT - Cognition Comments: A/O to self, location only                 Following commands: Impaired Following commands impaired: Follows one step commands with increased time, Follows multi-step commands inconsistently      Cueing   Cueing Techniques: Verbal cues, Tactile cues  Exercises Exercises: Other exercises Other Exercises Other Exercises: Edu: Sequencing STS and lateral steps up the bed    Shoulder Instructions       General Comments 95% on 2L oxygen throughout session    Pertinent Vitals/ Pain       Pain Assessment Pain Assessment: No/denies pain   Frequency  Min 2X/week        Progress Toward Goals  OT Goals(current goals can now be found in the care plan section)     Acute Rehab OT Goals Patient Stated Goal: breathe better OT Goal Formulation: With patient Time For Goal Achievement: 08/16/23 Potential to Achieve Goals: Fair  Plan      Co-evaluation                 AM-PAC OT "6 Clicks" Daily Activity     Outcome Measure   Help from another person eating meals?: None Help from another person taking care of personal grooming?: A Little Help from another person toileting, which includes using toliet, bedpan, or urinal?: A Lot Help from another person bathing (including washing, rinsing, drying)?: A Lot Help from another person to put on and taking off regular upper body clothing?: A Little Help from another person to put on and taking off regular lower body clothing?: A Lot 6 Click Score: 16    End of Session Equipment Utilized During Treatment: Gait belt;Rolling walker (2 wheels);Oxygen  OT Visit Diagnosis: Other abnormalities of gait and mobility (R26.89);Repeated falls (R29.6);Muscle weakness (generalized) (M62.81)   Activity Tolerance Patient tolerated treatment well   Patient Left in bed;with call bell/phone  within reach;with bed alarm set   Nurse Communication Mobility status        Time: 1421-1446 OT Time Calculation (min): 25 min  Charges: OT General Charges $OT Visit: 1 Visit OT Treatments $Self Care/Home Management : 23-37 mins  Glenard Haring M.S. OTR/L  08/15/23, 3:49 PM

## 2023-08-15 NOTE — Progress Notes (Signed)
 Physical Therapy Treatment Patient Details Name: Paul Vaughan MRN: [id number] DOB: [birth year] Today's Date: 08/15/2023   History of Present Illness Patient is a 68 year old male who presented to ED with weakness and limited ability to ambulate. Patient lives at [facility] and [facility]. PMH includes dementia, CKD stage IIIa, HTN. MD assessment includes: acute hypoxic respiratory failure due to sepsis from pneumonia, AKI, rhabdomyolysis, and hypokalemia.    PT Comments  Pt was long sitting in bed upon arrival. He is much more alert than yesterday afternoon. Agrees to session but presents with very flat affect. Author assisted pt with ordering breakfast prior to performing OOB activity. Sao2 86% on rm air. Applied 2L o2 with sao2 improving to >90%. Resting BP  100/59 (73) HR 101 bpm at rest. Pt was able to exit L side of bed, stand a few times EOB prior to taking steps from EOB to recliner. Pt has severe tremors in standing. Poor gait quality form EOB to recliner. Overall pt tolerated session but did put forth great effort. DC recs remain appropriate to maximize his independence and safety with all ADLs    If plan is discharge home, recommend the following: A lot of help with walking and/or transfers;A lot of help with bathing/dressing/bathroom;Assistance with cooking/housework;Direct supervision/assist for medications management;Direct supervision/assist for financial management;Assist for transportation;Help with stairs or ramp for entrance;Supervision due to cognitive status     Equipment Recommendations  Other (comment) (Defer to next level of care)       Precautions / Restrictions Precautions Precautions: Fall Recall of Precautions/Restrictions: Impaired Precaution/Restrictions Comments: hx of IDD/dementia Restrictions Weight Bearing Restrictions Per Provider Order: No     Mobility  Bed Mobility Overal bed mobility: Needs Assistance Bed Mobility: Supine to Sit  Supine to sit:  Supervision  General bed mobility comments: TCs throughout for sequencing. pt's cognition affect abilities moreso than strength    Transfers Overall transfer level: Needs assistance Equipment used: Rolling walker (2 wheels) Transfers: Sit to/from Stand Sit to Stand: Contact guard assist, Min assist  General transfer comment: Pt was able to stand EOB 2 x prior to taking a few steps from EOB to recliner. Pt very tremortous in standing    Ambulation/Gait Ambulation/Gait assistance: Contact guard assist, Min assist Gait Distance (Feet): 5 Feet Assistive device: Rolling walker (2 wheels) Gait Pattern/deviations: Step-to pattern Gait velocity: decreased  General Gait Details: pt was able to tak ~ 5 steps to recliner from EOB with use of RW. Poor quality steps    Balance Overall balance assessment: Needs assistance Sitting-balance support: Feet supported Sitting balance-Leahy Scale: Good     Standing balance support: Bilateral upper extremity supported, During functional activity, Reliant on assistive device for balance Standing balance-Leahy Scale: Poor Standing balance comment: pt is at high risk of falls       Communication Communication Communication: Impaired Factors Affecting Communication: Other (comment) (flat affect/hx of dementia per chart)  Cognition Arousal: Alert Behavior During Therapy: Flat affect   PT - Cognitive impairments: History of cognitive impairments    PT - Cognition Comments: pt is much more alert today versus previous date. sao2 86% on rm air. Applied 2 L o2 and made RN aware. sao2 > 90 on 2 L o2 Following commands: Impaired Following commands impaired: Follows one step commands with increased time, Follows multi-step commands inconsistently    Cueing Cueing Techniques: Verbal cues, Tactile cues         Pertinent Vitals/Pain Pain Assessment Pain Assessment: No/denies pain Breathing:  normal     PT Goals (current goals can now be found in the  care plan section) Acute Rehab PT Goals Patient Stated Goal: none stated Progress towards PT goals: Progressing toward goals    Frequency    Min 2X/week       AM-PAC PT "6 Clicks" Mobility   Outcome Measure  Help needed turning from your back to your side while in a flat bed without using bedrails?: A Little Help needed moving from lying on your back to sitting on the side of a flat bed without using bedrails?: A Little Help needed moving to and from a bed to a chair (including a wheelchair)?: A Little Help needed standing up from a chair using your arms (e.g., wheelchair or bedside chair)?: A Little Help needed to walk in hospital room?: A Lot Help needed climbing 3-5 steps with a railing? : A Lot 6 Click Score: 16    End of Session Equipment Utilized During Treatment: Oxygen Activity Tolerance: Patient limited by fatigue Patient left: in chair;with chair alarm set;with call bell/phone within reach Nurse Communication: Mobility status PT Visit Diagnosis: Muscle weakness (generalized) (M62.81);Other abnormalities of gait and mobility (R26.89)     Time: 9604-5409 PT Time Calculation (min) (ACUTE ONLY): 19 min  Charges:    $Therapeutic Activity: 8-22 mins PT General Charges $$ ACUTE PT VISIT: 1 Visit                     Jetta Lout PTA 08/15/23, 8:37 AM

## 2023-08-16 DIAGNOSIS — J9601 Acute respiratory failure with hypoxia: Secondary | ICD-10-CM | POA: Diagnosis not present

## 2023-08-16 LAB — BASIC METABOLIC PANEL WITH GFR
Anion gap: 7 (ref 5–15)
BUN: 29 mg/dL — ABNORMAL HIGH (ref 8–23)
CO2: 22 mmol/L (ref 22–32)
Calcium: 7.6 mg/dL — ABNORMAL LOW (ref 8.9–10.3)
Chloride: 103 mmol/L (ref 98–111)
Creatinine, Ser: 1.05 mg/dL (ref 0.61–1.24)
GFR, Estimated: >60 mL/min (ref >60)
Glucose, Bld: 86 mg/dL (ref 70–99)
Potassium: 4.1 mmol/L (ref 3.5–5.1)
Sodium: 132 mmol/L — ABNORMAL LOW (ref 135–145)

## 2023-08-16 LAB — URINALYSIS, ROUTINE W REFLEX MICROSCOPIC
Bilirubin Urine: NEGATIVE
Glucose, UA: NEGATIVE mg/dL
Hgb urine dipstick: NEGATIVE
Ketones, ur: NEGATIVE mg/dL
Leukocytes,Ua: NEGATIVE
Nitrite: NEGATIVE
Protein, ur: NEGATIVE mg/dL
Specific Gravity, Urine: 1.017 (ref 1.005–1.030)
pH: 7 (ref 5.0–8.0)

## 2023-08-16 LAB — CBC WITH DIFFERENTIAL/PLATELET
Abs Immature Granulocytes: 0.08 10*3/uL — ABNORMAL HIGH (ref 0.00–0.07)
Basophils Absolute: 0 10*3/uL (ref 0.0–0.1)
Basophils Relative: 0 %
Eosinophils Absolute: 0.3 10*3/uL (ref 0.0–0.5)
Eosinophils Relative: 5 %
HCT: 24.2 % — ABNORMAL LOW (ref 39.0–52.0)
Hemoglobin: 8.4 g/dL — ABNORMAL LOW (ref 13.0–17.0)
Immature Granulocytes: 1 %
Lymphocytes Relative: 18 %
Lymphs Abs: 1 10*3/uL (ref 0.7–4.0)
MCH: 33.7 pg (ref 26.0–34.0)
MCHC: 34.7 g/dL (ref 30.0–36.0)
MCV: 97.2 fL (ref 80.0–100.0)
Monocytes Absolute: 0.4 10*3/uL (ref 0.1–1.0)
Monocytes Relative: 6 %
Neutro Abs: 4.1 10*3/uL (ref 1.7–7.7)
Neutrophils Relative %: 70 %
Platelets: 223 10*3/uL (ref 150–400)
RBC: 2.49 MIL/uL — ABNORMAL LOW (ref 4.22–5.81)
RDW: 15.9 % — ABNORMAL HIGH (ref 11.5–15.5)
WBC: 5.9 10*3/uL (ref 4.0–10.5)
nRBC: 0 % (ref 0.0–0.2)

## 2023-08-16 LAB — RESP PANEL BY RT-PCR (RSV, FLU A&B, COVID)  RVPGX2
Influenza A by PCR: NEGATIVE
Influenza B by PCR: NEGATIVE
Resp Syncytial Virus by PCR: NEGATIVE
SARS Coronavirus 2 by RT PCR: NEGATIVE

## 2023-08-16 LAB — LACTIC ACID, PLASMA
Lactic Acid, Venous: 1.5 mmol/L (ref 0.5–1.9)
Lactic Acid, Venous: 1.1 mmol/L (ref 0.5–1.9)

## 2023-08-16 MED ORDER — IBUPROFEN 800 MG/200ML IV SOLN
800.0000 mg | Freq: Once | INTRAVENOUS | Status: AC
  Administered 2023-08-16: 800 mg via INTRAVENOUS
  Filled 2023-08-16: qty 200

## 2023-08-16 MED ORDER — SODIUM CHLORIDE 0.9 % IV SOLN
800.0000 mg | Freq: Once | INTRAVENOUS | Status: DC
  Filled 2023-08-16: qty 8

## 2023-08-16 MED ORDER — SODIUM CHLORIDE 0.9 % IV BOLUS
30.0000 mL/kg | Freq: Once | INTRAVENOUS | Status: AC
  Administered 2023-08-16: 2298 mL via INTRAVENOUS

## 2023-08-16 MED ORDER — SODIUM CHLORIDE 0.9 % IV BOLUS
1000.0000 mL | Freq: Once | INTRAVENOUS | Status: AC
  Administered 2023-08-16: 1000 mL via INTRAVENOUS

## 2023-08-16 MED ORDER — SODIUM CHLORIDE 0.9 % IV SOLN: 800.0000 mg | Freq: Once | INTRAVENOUS | Status: DC

## 2023-08-16 MED ORDER — METRONIDAZOLE 500 MG/100ML IV SOLN
500.0000 mg | Freq: Two times a day (BID) | INTRAVENOUS | Status: DC
  Administered 2023-08-16 – 2023-08-17 (×3): 500 mg via INTRAVENOUS
  Filled 2023-08-16 (×4): qty 100

## 2023-08-16 NOTE — Progress Notes (Signed)
 Notified MD of b/p of 72/49. Bolus ordered and started. Patient stated he "feels okay" and denies lightheadedness of chest pain. Will continue to monitor.

## 2023-08-16 NOTE — Plan of Care (Signed)
  Problem: Fluid Volume: Goal: Hemodynamic stability will improve Outcome: Progressing   Problem: Clinical Measurements: Goal: Diagnostic test results will improve Outcome: Progressing   Problem: Respiratory: Goal: Ability to maintain adequate ventilation will improve Outcome: Progressing   Problem: Education: Goal: Knowledge of General Education information will improve Description: Including pain rating scale, medication(s)/side effects and non-pharmacologic comfort measures Outcome: Not Progressing   Problem: Health Behavior/Discharge Planning: Goal: Ability to manage health-related needs will improve Outcome: Not Progressing   Problem: Clinical Measurements: Goal: Ability to maintain clinical measurements within normal limits will improve Outcome: Progressing Goal: Will remain free from infection Outcome: Progressing Goal: Diagnostic test results will improve Outcome: Progressing Goal: Respiratory complications will improve Outcome: Progressing Goal: Cardiovascular complication will be avoided Outcome: Progressing   Problem: Activity: Goal: Risk for activity intolerance will decrease Outcome: Not Progressing   Problem: Coping: Goal: Level of anxiety will decrease Outcome: Progressing   Problem: Elimination: Goal: Will not experience complications related to bowel motility Outcome: Progressing Goal: Will not experience complications related to urinary retention Outcome: Progressing   Problem: Pain Managment: Goal: General experience of comfort will improve and/or be controlled Outcome: Progressing   Problem: Safety: Goal: Ability to remain free from injury will improve Outcome: Progressing   Problem: Skin Integrity: Goal: Risk for impaired skin integrity will decrease Outcome: Progressing

## 2023-08-16 NOTE — Progress Notes (Signed)
 Urine sample was not obtained due to multiple episodes of incontinence. Will endorse to day shift RN to attempt collection.

## 2023-08-16 NOTE — Progress Notes (Signed)
 The patient's heart rate and temperature have improved; however, he developed asymptomatic hypotension with a blood pressure of 70/45 mmHg. The on-call provider, Manuela Schwartz, NP, was notified and ordered a 30 mL/kg IV fluid bolus.  The bolus has been initiated. Vital signs will be reassessed one hour into the bolus and again post completion to evaluate the patient's response

## 2023-08-16 NOTE — Progress Notes (Signed)
 Per [place] [facility] protocol, the patient meets two SIRS criteria, warranting notification of the provider on call Manuela Schwartz, NP) and Rapid Response nurse. Both the provider and RR RN evaluated the patient at the bedside. New orders from the provider include: NPO status, Speech evaluation, IV Ibuprofen, respiratory panel, airborne and droplet precautions to rule out infectious etiology, blood cultures, lactic acid and added flagyl to antibiotics. Orders have been implemented. We will continue to follow the [facility] MEWS protocol and monitor the patient closely for any changes or need for escalation of care.

## 2023-08-16 NOTE — Evaluation (Signed)
 Clinical/Bedside Swallow Evaluation Patient Details  Name: Paul Vaughan MRN: [id number] Date of Birth: [birth year]  Today's Date: 08/16/2023 Time: SLP Start Time (ACUTE ONLY): 0950 SLP Stop Time (ACUTE ONLY): 1050 SLP Time Calculation (min) (ACUTE ONLY): 60 min  Past Medical History:  Past Medical History:  Diagnosis Date   CHF (congestive heart failure) (HCC)    Chronic kidney disease    COPD (chronic obstructive pulmonary disease) (HCC)    Dementia (HCC)    Past Surgical History: History reviewed. No pertinent surgical history. HPI:  Pt is a 68 y.o. male with medical history significant of Dementia, CKD stage IIIa, COPD, CHF, who presents to the ED due to generalized weakness.  History obtained through chart review due to patient's underlying Dementia. Caregiver previously at bedside noted patient has been experiencing increased generalized weakness with poor appetite and episodes of urinary continence with increasing altered mental status over the last few days.  At admit, he was hypotensive at 86/53 with heart rate of 90.  He was saturating at 88% on room air and subsequently placed on 3 L.  Afebrile.  Chest Imaging: Chronic bilateral interstitial thickening and reticular  opacification within the peripheral right-greater-than-left lungs,  greatest within the superolateral right lung. This is in the same  distribution as interstitial thickening and airspace opacity on  06/30/2021 and 12/13/2019, however the opacities are worsened from  prior. This may represent worsening chronic interstitial lung  disease. It is difficult to exclude a superimposed right apical pneumonia.  Also noted Belching w/ oral intake -- unknown if h/o REFLUX issues?  Pt is Edentulous. TOC reports pt is mostly Sedentary which does not help Pulmonary status.    Assessment / Plan / Recommendation  Clinical Impression    Pt seen for BSE today. Today, pt awake, verbally responded to basic questions re: self. He followed  instructions w/ MIN-MOD cue. "it's Paul Vaughan" when asked what he liked to be called. He nodded to something to drink. Pt has dx'd Dementia baseline. Pt is Sedentary per report -- needs to be more Mobile to best support Pulmonary status, healing. Also noted Belching w/ oral intake -- unknown if h/o REFLUX issues?  On [place] o2 3L down from 4L previously; afebrile. WBC WNL.   Pt appears to present w/ grossly functional oropharyngeal phase swallowing in setting of Edentulous status w/ No overt neuromuscular deficits noted. Pt consumed po trials w/ No overt, clinical s/s of aspiration during po trials.  Pt appears at reduced risk for aspiration following general aspiration precautions. However, pt does have challenging factors that could impact oropharyngeal swallowing to include acute/lengthy hospitalization, Cognitive decline/Dementia, deconditioning/weakness, and need for support at meals especially for Positioning d/t weakness and Cognitive decline. These factors can increase risk for dysphagia as well as decreased oral intake overall.    During po trials, pt fed self consuming all consistencies placed on tray table w/ no overt coughing, decline in vocal quality, or change in respiratory presentation during/post trials. O2 sats remained at his baseline of 97%. Oral phase appeared grossly [facility] w/ timely bolus management, mashing/gumming of softened solids, and control of bolus propulsion for A-P transfer for swallowing. Oral clearing achieved w/ all trial consistencies -- well-moistened, soft foods given.  OM Exam appeared [facility] w/ no unilateral weakness noted. Speech Clear, low volume. Setup given.    Recommend return to a more Mech Soft consistency diet w/ well-Chopped meats, moistened foods(for ease of gumming d/t Edentulous status); Thin liquids -- CUP DRINKING ONLY and pt  should Hold Cup when drinking. Recommend general aspiration precautions, tray setup and sitting up support. Reduce distractions during meals  and monitor for any impulsive feeding behaviors. Pills WHOLE vs CRUSHED in Puree for safer, easier swallowing -- pt has been doing this since last assessment and tolerated well per pt/NSG. This is encouraged now and for D/C.    Education given on Pills in Puree; food consistencies and easy to eat options; general aspiration precautions to pt. Suspect pt is at his baseline per last assessment. MD to reconsult if any new needs while admitted. NSG/MD updated, agreed. Recommend Dietician f/u for support. Precautions posted in room, chart. SLP Visit Diagnosis: Dysphagia, oral phase (R13.11) (in setting of Dementia; Edentulous status; support at meals)    Aspiration Risk  Mild aspiration risk;Risk for inadequate nutrition/hydration (reduced w/ Support and general aspiration precautions -- cannot lie down in bed and eat/drink)    Diet Recommendation   Thin;Dysphagia 3 (mechanical soft) (Meats Minced w/ Gravies) = a more Mech Soft consistency diet w/ well-Chopped meats, moistened foods(for ease of gumming d/t Edentulous status); Thin liquids -- CUP DRINKING ONLY and pt should Hold Cup when drinking. Recommend general aspiration precautions, tray setup and sitting up support. Reduce distractions during meals and monitor for any impulsive feeding behaviors.   Medication Administration: Whole meds with puree (vs Crushed in Puree if needed per NSG)    Other  Recommendations Recommended Consults:  (Dietician; GI f/u for REFLUX if indicated?) Oral Care Recommendations: Oral care BID;Oral care before and after PO;Staff/trained caregiver to provide oral care    Recommendations for follow up therapy are one component of a multi-disciplinary discharge planning process, led by the attending physician.  Recommendations may be updated based on patient status, additional functional criteria and insurance authorization.  Follow up Recommendations No SLP follow up      Assistance Recommended at Discharge   Intermittent>full at meals  Functional Status Assessment Patient has had a recent decline in their functional status and demonstrates the ability to make significant improvements in function in a reasonable and predictable amount of time.  Frequency and Duration  (n/a)   (n/a)       Prognosis Prognosis for improved oropharyngeal function: Good Barriers to Reach Goals: Cognitive deficits;Language deficits;Time post onset;Severity of deficits Barriers/Prognosis Comment: Edentulous status; Dementia; need for support at meals      Swallow Study   General Date of Onset: 07/28/23 HPI: Pt is a 68 y.o. male with medical history significant of Dementia, CKD stage IIIa, COPD, CHF, who presents to the ED due to generalized weakness.  History obtained through chart review due to patient's underlying Dementia. Caregiver previously at bedside noted patient has been experiencing increased generalized weakness with poor appetite and episodes of urinary continence with increasing altered mental status over the last few days.  At admit, he was hypotensive at 86/53 with heart rate of 90.  He was saturating at 88% on room air and subsequently placed on 3 L.  Afebrile.  Chest Imaging: Chronic bilateral interstitial thickening and reticular  opacification within the peripheral right-greater-than-left lungs,  greatest within the superolateral right lung. This is in the same  distribution as interstitial thickening and airspace opacity on  06/30/2021 and 12/13/2019, however the opacities are worsened from  prior. This may represent worsening chronic interstitial lung  disease. It is difficult to exclude a superimposed right apical pneumonia.  Also noted Belching w/ oral intake -- unknown if h/o REFLUX issues?  Pt is Edentulous. Type  of Study: Bedside Swallow Evaluation Previous Swallow Assessment: 07/31/2023 - mech soft, thins -- this admit; 06/2021 - regular, thins Diet Prior to this Study: NPO (after a [place] -- had  been rec'd a mech soft diet, thins previously d/t Edentulous status) Temperature Spikes Noted: No (wbc never elevated - 5.9) Respiratory Status: Nasal cannula (3L) History of Recent Intubation: No Behavior/Cognition: Alert;Cooperative;Pleasant mood;Confused;Distractible;Requires cueing (baseline Dementia) Oral Cavity Assessment: Dry Oral Care Completed by SLP: Yes Oral Cavity - Dentition: Edentulous Vision: Functional for self-feeding Self-Feeding Abilities: Able to feed self;Needs assist;Needs set up Patient Positioning: Upright in bed (MOD assist) Baseline Vocal Quality: Normal;Low vocal intensity (min) Volitional Cough: Strong Volitional Swallow: Able to elicit    Oral/Motor/Sensory Function Overall Oral Motor/Sensory Function: Within functional limits   Ice Chips Ice chips: Within functional limits Presentation: Spoon (fed; 2 trials)   Thin Liquid Thin Liquid: Within functional limits Presentation: Cup;Self Fed (~8+ ozs) Other Comments: water, juice    Nectar Thick Nectar Thick Liquid: Not tested   Honey Thick Honey Thick Liquid: Not tested   Puree Puree: Within functional limits Presentation: Self Fed;Spoon (~4 ozs)   Solid     Solid: Impaired (Edentulous baseline) Presentation: Spoon;Self Fed (9+ trials) Oral Phase Impairments: Impaired mastication (Edentulous) Oral Phase Functional Implications:  (min extra Time) Pharyngeal Phase Impairments:  (none) Other Comments: moistened foods well        Jerilynn Som, MS, CCC-SLP Speech Language Pathologist Rehab Services; [facility] [place] [phone] (ascom) Jakiyah Stepney 08/16/2023,1:22 PM

## 2023-08-16 NOTE — Plan of Care (Signed)

## 2023-08-16 NOTE — Progress Notes (Signed)
 Occupational Therapy Treatment Patient Details Name: Paul Vaughan MRN: [id number] DOB: 02-13-1956 Today's Date: 08/16/2023   History of present illness Patient is a 68 year old male who presented to ED with weakness and limited ability to ambulate. Patient lives at [facility] and [facility]. PMH includes dementia, CKD stage IIIa, HTN. MD assessment includes: acute hypoxic respiratory failure due to sepsis from pneumonia, AKI, rhabdomyolysis, and hypokalemia.   OT comments  Paul Vaughan was seen for OT treatment on this date. Upon arrival to room pt in room with O2 doffed, SpO2 87% on RA, replaced on 2L [place] and resolved to 97%. Pt agreeable to tx, reports having BM. Pt requires MIN A + RW simulated BSC t/f, MAX A pericare in standing, required multiple standing trials due to large volume BM.Goals updated to reflect progress, will continue to follow POC. Discharge recommendation remains appropriate.        If plan is discharge home, recommend the following:  Assistance with cooking/housework;Assist for transportation;Direct supervision/assist for medications management;Direct supervision/assist for financial management;Supervision due to cognitive status;Help with stairs or ramp for entrance;A lot of help with walking and/or transfers;A lot of help with bathing/dressing/bathroom   Equipment Recommendations  Other (comment) (defer)    Recommendations for Other Services      Precautions / Restrictions Precautions Precautions: Fall Recall of Precautions/Restrictions: Impaired Precaution/Restrictions Comments: hx of IDD/dementia Restrictions Weight Bearing Restrictions Per Provider Order: No       Mobility Bed Mobility Overal bed mobility: Needs Assistance Bed Mobility: Supine to Sit, Sit to Supine     Supine to sit: Supervision Sit to supine: Supervision        Transfers Overall transfer level: Needs assistance Equipment used: Rolling walker (2 wheels) Transfers: Sit to/from  Stand Sit to Stand: Min assist     Step pivot transfers: Min assist           Balance Overall balance assessment: Needs assistance Sitting-balance support: Feet supported Sitting balance-Leahy Scale: Good     Standing balance support: Bilateral upper extremity supported, During functional activity, Reliant on assistive device for balance Standing balance-Leahy Scale: Poor                             ADL either performed or assessed with clinical judgement   ADL Overall ADL's : Needs assistance/impaired                                       General ADL Comments: MIN A + RW simulated BSC t/f, MAX A pericare in standing, required multiple standing trials due to large volume BM.     Communication Communication Communication: Impaired   Cognition Arousal: Alert Behavior During Therapy: Flat affect Cognition: No family/caregiver present to determine baseline, Cognition impaired   Orientation impairments: Situation                           Following commands: Impaired Following commands impaired: Follows one step commands with increased time, Follows multi-step commands inconsistently      Cueing      Exercises      Shoulder Instructions       General Comments      Pertinent Vitals/ Pain       Pain Assessment Pain Assessment: No/denies pain   Frequency  Min 2X/week  Progress Toward Goals  OT Goals(current goals can now be found in the care plan section)  Progress towards OT goals: Progressing toward goals  Acute Rehab OT Goals Patient Stated Goal: breathe better OT Goal Formulation: With patient Time For Goal Achievement: 08/30/23 Potential to Achieve Goals: Fair ADL Goals Pt Will Perform Lower Body Dressing: with min assist;sit to/from stand;sitting/lateral leans Pt Will Transfer to Toilet: ambulating;regular height toilet;with supervision Pt Will Perform Toileting - Clothing Manipulation and  hygiene: sitting/lateral leans;with supervision Additional ADL Goal #1: Pt will utilize learned ECS to support ADL participation and safety, PRN VC from staff, 3/3 opportunities  Plan      Co-evaluation                 AM-PAC OT "6 Clicks" Daily Activity     Outcome Measure   Help from another person eating meals?: None Help from another person taking care of personal grooming?: A Little Help from another person toileting, which includes using toliet, bedpan, or urinal?: A Lot Help from another person bathing (including washing, rinsing, drying)?: A Lot Help from another person to put on and taking off regular upper body clothing?: A Little Help from another person to put on and taking off regular lower body clothing?: A Lot 6 Click Score: 16    End of Session Equipment Utilized During Treatment: Rolling walker (2 wheels)  OT Visit Diagnosis: Other abnormalities of gait and mobility (R26.89);Repeated falls (R29.6);Muscle weakness (generalized) (M62.81)   Activity Tolerance Patient tolerated treatment well   Patient Left in bed;with call bell/phone within reach;with bed alarm set   Nurse Communication Mobility status        Time: 1610-9604 OT Time Calculation (min): 15 min  Charges: OT General Charges $OT Visit: 1 Visit OT Treatments $Self Care/Home Management : 8-22 mins  Kathie Dike, M.S. OTR/L  08/16/23, 3:52 PM  ascom [phone]

## 2023-08-16 NOTE — Progress Notes (Signed)
 Progress Note   Patient: Paul Vaughan ZOX:[id number] DOB: April 02, 1956 DOA: 07/28/2023     19 DOS: the patient was seen and examined on 08/16/2023     Brief hospital course: From HPI "68 y.o. male with medical history significant of dementia, CKD stage IIIa, COPD, CHF, who presents to the ED due to generalized weakness. History obtained through chart review due to patient's underlying dementia. Caregiver previously at bedside noted patient has been experiencing increased generalized weakness with poor appetite and episodes of urinary continence with increasing altered mental status over the last few days.   ED course: On arrival to the ED, patient was hypotensive at 86/53 with heart rate of 90.  He was saturating at 88% on room air and subsequently placed on 3 L.  He was afebrile at 98.6.  Initial workup notable for WBC 12.5, BUN 64, creatinine 2.27, AST 189, ALT 76, GFR of 31.  Urinalysis with no leukocytes, nitrites or bacteria.  Chest x-ray with chronic interstitial lung changes, however increased opacities compared to prior with possible right apical pneumonia.  CT of the abdomen with no acute findings.  Patient started on IV fluids, azithromycin, ceftriaxone, DuoNebs, and Solu-Medrol.  TRH contacted for admission.  "   Hospital course as outlined below     Assessment and Plan:   * Acute hypoxic respiratory failure (HCC) Patient with a pulse ox of 88% on room air on admission, due to sepsis from pneumonia.  3/18 developed acute worsening hypoxia after aspiration episode. Transferred to stepdown and pulmonology was consulted. Pulmonology consulted - signed off Patient has been initiated on antibiotic therapy given new infiltrate on the right upper lobe  Being re-initiated on IV antibiotics Has completed a steroid taper Continue Mucomyst nebulization as needed    Hyponatremia likely secondary to hypovolemia due to poor oral intake-improved Patient noted to have sodium of 125 on  08/13/2020 with Continue IV fluid   Severe sepsis  Present on admission with bilateral pneumonia, hypotension, leukocytosis, tachycardia elevated lactic acid and acute hypoxic respiratory failure and acute kidney injury.   Continue ceftriaxone azithromycin and metronidazole Follow-up on repeat blood cultures   Multifocal pneumonia Continue current antibiotics   Hypokalemia -improved Monitor BMP   Acute kidney injury superimposed on CKD  AKI on CKD stage IIIa.   Creatinine 2.27 on presentation.  Normalized to baseline. Cr 0.86 (3/23) Monitor renal function   Elevated LFTs - improved Likely secondary to sepsis   Chronic obstructive pulmonary disease, unspecified Continue as needed nebulization --Pulmonology consulted, have signed off   Rhabdomyolysis Hold Crestor.  No falls at home.  PT evaluation.   Resolved with IV fluids.   Chronic diastolic CHF Currently no signs of heart failure.  Watch closely with gentle fluids.  Echo back in 2024 showed EF greater than 55%. 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 Continue Depakote, Lexapro, Seroquel   Essential (primary) hypertension Systolic BP's in 811'B to 150's.  BP meds have been on hold. Holding blood pressure medication given relative hypotension   Subjective:  This morning patient blood pressure 72/49 which later improved to 121/67 IV fluid bolus ordered Blood pressure improving He denies nausea vomiting or abdominal pain     Physical Exam: General exam: awake, alert, no acute distress Respiratory system: CTAB, no wheezes, rales or rhonchi,  normal respiratory effort. Cardiovascular system: normal S1/S2, RRR  Gastrointestinal system: soft, NT, ND Central nervous system: no gross  focal neurologic deficits, normal speech Extremities: trace pedal edema, normal tone Skin: dry, intact, normal temperature     Disposition: Pending DSS as well as placement availability of the medical supply   Data Reviewed:   Vitals:   08/16/23 0516 08/16/23 0616 08/16/23 0830 08/16/23 1300  BP: (!) 84/55 (P) 95/67 (!) 72/49 121/67  Pulse: 91 85 79   Resp: 20 20 18    Temp:  98.5 F (36.9 C) 98 F (36.7 C)   TempSrc:  Oral    SpO2: 99% 98% 97%   Weight:      Height:          Latest Ref Rng & Units 08/16/2023    8:48 AM 08/15/2023    9:26 AM 08/14/2023    1:59 AM  CBC  WBC 4.0 - 10.5 K/uL 5.9  6.2  6.3   Hemoglobin 13.0 - 17.0 g/dL 8.4  40.9  81.1   Hematocrit 39.0 - 52.0 % 24.2  31.4  30.7   Platelets 150 - 400 K/uL 223  281  286        Latest Ref Rng & Units 08/16/2023    8:48 AM 08/15/2023    9:26 AM 08/14/2023    1:59 AM  BMP  Glucose 70 - 99 mg/dL 86  914  87   BUN 8 - 23 mg/dL 29  26  27    Creatinine 0.61 - 1.24 mg/dL 7.82  9.56  2.13   Sodium 135 - 145 mmol/L 132  130  125   Potassium 3.5 - 5.1 mmol/L 4.1  4.4  4.6   Chloride 98 - 111 mmol/L 103  99  93   CO2 22 - 32 mmol/L 22  22  23    Calcium 8.9 - 10.3 mg/dL 7.6  8.7  8.7      Author: Loyce Dys, MD 08/16/2023 3:05 PM  For on call review [email or web address].

## 2023-08-17 DIAGNOSIS — J9601 Acute respiratory failure with hypoxia: Secondary | ICD-10-CM | POA: Diagnosis not present

## 2023-08-17 MED ORDER — AZITHROMYCIN 500 MG PO TABS
500.0000 mg | ORAL_TABLET | Freq: Every day | ORAL | Status: AC
  Administered 2023-08-17: 500 mg via ORAL
  Filled 2023-08-17: qty 1

## 2023-08-17 NOTE — Plan of Care (Signed)
  Problem: Fluid Volume: Goal: Hemodynamic stability will improve Outcome: Progressing   Problem: Clinical Measurements: Goal: Diagnostic test results will improve Outcome: Progressing Goal: Signs and symptoms of infection will decrease Outcome: Progressing   Problem: Respiratory: Goal: Ability to maintain adequate ventilation will improve Outcome: Progressing   Problem: Education: Goal: Knowledge of General Education information will improve Description: Including pain rating scale, medication(s)/side effects and non-pharmacologic comfort measures Outcome: Progressing   Problem: Clinical Measurements: Goal: Ability to maintain clinical measurements within normal limits will improve Outcome: Progressing   Problem: Activity: Goal: Risk for activity intolerance will decrease Outcome: Progressing   Problem: Nutrition: Goal: Adequate nutrition will be maintained Outcome: Progressing   Problem: Coping: Goal: Level of anxiety will decrease Outcome: Progressing   Problem: Elimination: Goal: Will not experience complications related to bowel motility Outcome: Progressing Goal: Will not experience complications related to urinary retention Outcome: Progressing   Problem: Pain Managment: Goal: General experience of comfort will improve and/or be controlled Outcome: Progressing   Problem: Safety: Goal: Ability to remain free from injury will improve Outcome: Progressing   Problem: Skin Integrity: Goal: Risk for impaired skin integrity will decrease Outcome: Progressing

## 2023-08-17 NOTE — Plan of Care (Signed)

## 2023-08-17 NOTE — Progress Notes (Signed)
 Progress Note   Patient: Paul Vaughan ZOX:[id number] DOB: Feb 10, 1956 DOA: 07/28/2023     20 DOS: the patient was seen and examined on 08/17/2023    Brief hospital course: From HPI "68 y.o. male with medical history significant of dementia, CKD stage IIIa, COPD, CHF, who presents to the ED due to generalized weakness. History obtained through chart review due to patient's underlying dementia. Caregiver previously at bedside noted patient has been experiencing increased generalized weakness with poor appetite and episodes of urinary continence with increasing altered mental status over the last few days.   ED course: On arrival to the ED, patient was hypotensive at 86/53 with heart rate of 90.  He was saturating at 88% on room air and subsequently placed on 3 L.  He was afebrile at 98.6.  Initial workup notable for WBC 12.5, BUN 64, creatinine 2.27, AST 189, ALT 76, GFR of 31.  Urinalysis with no leukocytes, nitrites or bacteria.  Chest x-ray with chronic interstitial lung changes, however increased opacities compared to prior with possible right apical pneumonia.  CT of the abdomen with no acute findings.  Patient started on IV fluids, azithromycin, ceftriaxone, DuoNebs, and Solu-Medrol.  TRH contacted for admission.  "   Hospital course as outlined below     Assessment and Plan:   * Acute hypoxic respiratory failure (HCC) Patient with a pulse ox of 88% on room air on admission, due to sepsis from pneumonia.  3/18 developed acute worsening hypoxia after aspiration episode. Transferred to stepdown and pulmonology was consulted. Pulmonology consulted - signed off Patient has been initiated on antibiotic therapy given new infiltrate on the right upper lobe Patient has been re-initiated on IV antibiotics Has completed a steroid taper Continue Mucomyst nebulization as needed     Hyponatremia likely secondary to hypovolemia due to poor oral intake-improved Patient noted to have sodium of 125 on  08/13/2020 with Improved with IV fluid Continue continue fluid therapy Monitor sodium level closely   Severe sepsis  Present on admission with bilateral pneumonia, hypotension, leukocytosis, tachycardia elevated lactic acid and acute hypoxic respiratory failure and acute kidney injury.   Improved and later became septic again requiring reinitiation of antibiotics Continue ceftriaxone and azithromycin is here Follow-up on repeat blood cultures   Multifocal pneumonia Continue current antibiotics   Hypokalemia -improved Monitor BMP   Acute kidney injury superimposed on CKD  AKI on CKD stage IIIa.   Creatinine 2.27 on presentation.  Normalized to baseline. Cr 0.86 (3/23) Monitor renal function   Elevated LFTs - improved Likely secondary to sepsis   Chronic obstructive pulmonary disease, unspecified Continue as needed nebulization --Pulmonology consulted, have signed off   Rhabdomyolysis Hold Crestor.  No falls at home.  PT evaluation.   Resolved with IV fluids.   Chronic diastolic CHF Currently no signs of heart failure.  Watch closely with gentle fluids.  Echo back in 2024 showed EF greater than 55%. 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 Continue Depakote, Lexapro, Seroquel   Essential (primary) hypertension Systolic BP's in 811'B to 150's.  BP meds have been on hold. Holding blood pressure medication given relative hypotension   Subjective:  Patient seen and examined at bedside this morning Hypotension is improving Blood cultures taken yesterday still pending results Denies nausea vomiting abdominal pain or chest  pain We will continue to wean off oxygen as tolerated     Physical Exam: General exam: awake, alert, no acute  distress Respiratory system: CTAB, no wheezes, rales or rhonchi, normal respiratory effort. Cardiovascular system: normal S1/S2, RRR  Gastrointestinal system: soft, NT, ND Central nervous system: no gross focal neurologic deficits, normal speech Extremities: trace pedal edema, normal tone Skin: dry, intact, normal temperature     Disposition: Pending DSS as well as placement availability after medical stabilization   Data Reviewed:      Latest Ref Rng & Units 08/16/2023    8:48 AM 08/15/2023    9:26 AM 08/14/2023    1:59 AM  BMP  Glucose 70 - 99 mg/dL 86  664  87   BUN 8 - 23 mg/dL 29  26  27    Creatinine 0.61 - 1.24 mg/dL 4.03  4.74  2.59   Sodium 135 - 145 mmol/L 132  130  125   Potassium 3.5 - 5.1 mmol/L 4.1  4.4  4.6   Chloride 98 - 111 mmol/L 103  99  93   CO2 22 - 32 mmol/L 22  22  23    Calcium 8.9 - 10.3 mg/dL 7.6  8.7  8.7     Vitals:   08/16/23 2154 08/17/23 0043 08/17/23 0350 08/17/23 0500  BP: 125/62 116/61 (!) 101/47   Pulse: (!) 123 (!) 118    Resp: 16 19 18    Temp: 98.8 F (37.1 C) 98.7 F (37.1 C) 98.6 F (37 C)   TempSrc: Oral Oral Oral   SpO2: 95% 95% 90%   Weight:    80.2 kg  Height:          Latest Ref Rng & Units 08/16/2023    8:48 AM 08/15/2023    9:26 AM 08/14/2023    1:59 AM  CBC  WBC 4.0 - 10.5 K/uL 5.9  6.2  6.3   Hemoglobin 13.0 - 17.0 g/dL 8.4  56.3  87.5   Hematocrit 39.0 - 52.0 % 24.2  31.4  30.7   Platelets 150 - 400 K/uL 223  281  286      Author: Loyce Dys, MD 08/17/2023 11:35 AM  For on call review [email or web address].

## 2023-08-18 DIAGNOSIS — J9601 Acute respiratory failure with hypoxia: Secondary | ICD-10-CM | POA: Diagnosis not present

## 2023-08-18 LAB — CBC WITH DIFFERENTIAL/PLATELET
Abs Immature Granulocytes: 0.11 10*3/uL — ABNORMAL HIGH (ref 0.00–0.07)
Basophils Absolute: 0 10*3/uL (ref 0.0–0.1)
Basophils Relative: 0 %
Eosinophils Absolute: 0.3 10*3/uL (ref 0.0–0.5)
Eosinophils Relative: 4 %
HCT: 27.6 % — ABNORMAL LOW (ref 39.0–52.0)
Hemoglobin: 9.4 g/dL — ABNORMAL LOW (ref 13.0–17.0)
Immature Granulocytes: 2 %
Lymphocytes Relative: 19 %
Lymphs Abs: 1.3 10*3/uL (ref 0.7–4.0)
MCH: 32.6 pg (ref 26.0–34.0)
MCHC: 34.1 g/dL (ref 30.0–36.0)
MCV: 95.8 fL (ref 80.0–100.0)
Monocytes Absolute: 0.7 10*3/uL (ref 0.1–1.0)
Monocytes Relative: 11 %
Neutro Abs: 4.4 10*3/uL (ref 1.7–7.7)
Neutrophils Relative %: 64 %
Platelets: 240 10*3/uL (ref 150–400)
RBC: 2.88 MIL/uL — ABNORMAL LOW (ref 4.22–5.81)
RDW: 15.5 % (ref 11.5–15.5)
WBC: 6.8 10*3/uL (ref 4.0–10.5)
nRBC: 0 % (ref 0.0–0.2)

## 2023-08-18 LAB — BASIC METABOLIC PANEL WITH GFR
Anion gap: 8 (ref 5–15)
BUN: 20 mg/dL (ref 8–23)
CO2: 25 mmol/L (ref 22–32)
Calcium: 8.8 mg/dL — ABNORMAL LOW (ref 8.9–10.3)
Chloride: 97 mmol/L — ABNORMAL LOW (ref 98–111)
Creatinine, Ser: 0.76 mg/dL (ref 0.61–1.24)
GFR, Estimated: >60 mL/min (ref >60)
Glucose, Bld: 103 mg/dL — ABNORMAL HIGH (ref 70–99)
Potassium: 4.1 mmol/L (ref 3.5–5.1)
Sodium: 130 mmol/L — ABNORMAL LOW (ref 135–145)

## 2023-08-18 MED ORDER — EMPAGLIFLOZIN 10 MG PO TABS
10.0000 mg | ORAL_TABLET | Freq: Every day | ORAL | Status: DC
Start: 2023-08-19 — End: 2023-08-19
  Administered 2023-08-19: 10 mg via ORAL
  Filled 2023-08-18: qty 1

## 2023-08-18 MED ORDER — ROSUVASTATIN CALCIUM 20 MG PO TABS
20.0000 mg | ORAL_TABLET | Freq: Every day | ORAL | Status: DC
  Administered 2023-08-19: 20 mg via ORAL
  Filled 2023-08-18 (×2): qty 1

## 2023-08-18 NOTE — Progress Notes (Signed)
 Physical Therapy Treatment Patient Details Name: Paul Vaughan MRN: [id number] DOB: 09/12/1955 Today's Date: 08/18/2023   History of Present Illness Patient is a 68 year old male who presented to ED with weakness and limited ability to ambulate. Patient lives at [facility] and [facility]. PMH includes dementia, CKD stage IIIa, HTN. MD assessment includes: acute hypoxic respiratory failure due to sepsis from pneumonia, AKI, rhabdomyolysis, and hypokalemia.    PT Comments  Pt in bed, ready to get up.  Needs overall increased assist today with mobility.  Mod a x 1 to EOB.  Upon sitting, he has significant L lean today and unable to sit unsupported.  Noted to be inc BM and tech called to assist.  He does stand x 2 EOB with mod a x 1 and continued heavy L lean with L LE remaining flexed and unable to straighten.  He is unable to take any lateral steps along bed or transfer to recliner at bedside.  Returned to supine with max a x 1 and repositioned in bed.    Pt is having increased difficulty today with mobility and L lean which was not noted in prior notes.  Reached out to MD.  He is appropriate in responses and says he feels ok today with no changes.     If plan is discharge home, recommend the following: A lot of help with walking and/or transfers;A lot of help with bathing/dressing/bathroom;Assistance with cooking/housework;Direct supervision/assist for medications management;Direct supervision/assist for financial management;Assist for transportation;Help with stairs or ramp for entrance;Supervision due to cognitive status   Can travel by private vehicle        Equipment Recommendations       Recommendations for Other Services       Precautions / Restrictions Precautions Precautions: Fall Recall of Precautions/Restrictions: Impaired Precaution/Restrictions Comments: hx of IDD/dementia Restrictions Weight Bearing Restrictions Per Provider Order: No     Mobility  Bed Mobility Overal  bed mobility: Needs Assistance Bed Mobility: Supine to Sit, Sit to Supine     Supine to sit: Mod assist Sit to supine: Mod assist     Patient Response: Cooperative  Transfers Overall transfer level: Needs assistance Equipment used: Rolling walker (2 wheels) Transfers: Sit to/from Stand Sit to Stand: Mod assist           General transfer comment: heavy left lean today affecting his ability to transfer to chair or step    Ambulation/Gait                   Stairs             Wheelchair Mobility     Tilt Bed Tilt Bed Patient Response: Cooperative  Modified Rankin (Stroke Patients Only)       Balance Overall balance assessment: Needs assistance Sitting-balance support: Feet supported Sitting balance-Leahy Scale: Poor Sitting balance - Comments: signifiacnt L lean                                    Communication Communication Communication: Impaired  Cognition Arousal: Alert Behavior During Therapy: Flat affect   PT - Cognitive impairments: History of cognitive impairments                         Following commands: Impaired Following commands impaired: Follows one step commands with increased time, Follows multi-step commands inconsistently    Cueing Cueing Techniques: Verbal  cues, Tactile cues  Exercises Other Exercises Other Exercises: stood at bedside for care due to inc BM in bed x 2    General Comments        Pertinent Vitals/Pain Pain Assessment Pain Assessment: No/denies pain    Home Living                          Prior Function            PT Goals (current goals can now be found in the care plan section) Progress towards PT goals: Not progressing toward goals - comment    Frequency    Min 2X/week      PT Plan      Co-evaluation              AM-PAC PT "6 Clicks" Mobility   Outcome Measure  Help needed turning from your back to your side while in a flat bed without  using bedrails?: A Lot Help needed moving from lying on your back to sitting on the side of a flat bed without using bedrails?: A Lot Help needed moving to and from a bed to a chair (including a wheelchair)?: Total Help needed standing up from a chair using your arms (e.g., wheelchair or bedside chair)?: A Lot Help needed to walk in hospital room?: Total Help needed climbing 3-5 steps with a railing? : Total 6 Click Score: 9    End of Session Equipment Utilized During Treatment: Gait belt;Oxygen Activity Tolerance: Patient limited by fatigue Patient left: in bed;with bed alarm set;with call bell/phone within reach Nurse Communication: Mobility status PT Visit Diagnosis: Muscle weakness (generalized) (M62.81);Other abnormalities of gait and mobility (R26.89)     Time: 5284-1324 PT Time Calculation (min) (ACUTE ONLY): 14 min  Charges:    $Therapeutic Activity: 8-22 mins PT General Charges $$ ACUTE PT VISIT: 1 Visit                   Danielle Dess, PTA 08/18/23, 9:33 AM

## 2023-08-18 NOTE — Plan of Care (Signed)

## 2023-08-18 NOTE — Progress Notes (Addendum)
 PROGRESS NOTE    Paul Vaughan   WUJ:[id number] DOB: 01-29-1956  DOA: 07/28/2023 Date of Service: 08/18/23 which is hospital day 21  PCP: Franciso Bend, NP    Hospital course / significant events:   HPI: 68 y.o. male with medical history significant of dementia, CKD stage IIIa, COPD, CHF, who presents to the ED due to generalized weakness.    03/16: admitted to hospitalist service , tx pneumonia and rhabdo.  03/18: respiratory status worsening, to stepdown and pulmonology consulted  03/19: respiratory status stabilized, oxygen sats stable on 4 L/min. Stable to transfer to med/tele floor this afternoon.  03/22: weaned to 3L O2, stable awaiting SNF. Pulmonary s/o 04/03: resume abx for pneumonia  03/25 bed search, PASSR pending. Awaiting DSS guardian consent for placement, bed offer accepted 04/01, PASSR still pending but finalized 04/03, now holding thru weekend and hopefully to [facility] 04/07     Consultants:  Pulmonology   Procedures/Surgeries: none      ASSESSMENT & PLAN:   Acute hypoxic respiratory failure d/t pneumonia - resolved Continue on supportive O2 as needd     Hyponatremia likely secondary to hypovolemia due to poor oral intake - improved Monitor BMP   Severe sepsis d/t pneumonia - sepsis resolved  Multifocal pneumonia Continue abx restarted 4/03  Hypokalemia -improved Monitor BMP   AKI on CKD stage IIIa - resolved  Monitor renal function   Elevated LFTs - improved Likely secondary to sepsis Monitor LFT    Chronic obstructive pulmonary disease, unspecified Continue as needed nebulization Pulmonology consulted, have signed off   Rhabdomyolysis No falls at home.  PT evaluation.   Resolved with IV fluids. Recheck as needed   Chronic diastolic CHF Currently no signs of heart failure.  Watch closely with gentle fluids.  Echo back in 2024 showed EF greater than 55%. 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 Continue Depakote, Lexapro, Seroquel   Essential (primary) hypertension Systolic BP's in 956'O to 150's.  BP meds have been on hold. Holding blood pressure medication given relative hypotension     overweight based on BMI: Body mass index is 26.88 kg/m.  Underweight - under 18  overweight - 25 to 29 obese - 30 or more Class 1 obesity: BMI of 30.0 to 34 Class 2 obesity: BMI of 35.0 to 39 Class 3 obesity: BMI of 40.0 to 49 Super Morbid Obesity: BMI 50-59 Super-super Morbid Obesity: BMI 60+ Significantly low or high BMI is associated with higher medical risk.  Weight management advised as adjunct to other disease management and risk reduction treatments    DVT prophylaxis: lovenox  IV fluids: no continuous IV fluids  Nutrition: dysphagia diet Central lines / other devices: none  Code Status: FULL CODE ACP documentation reviewed:  none on file in [facility]  TOC needs: placement Medical barriers to dispo: none. Expected for discharge tomorrow.              Subjective / Brief ROS:  Patient reports no concerns  Denies CP/SOB.  Pain controlled.  Denies new weakness.  Tolerating diet.  Reports no concerns w/ urination/defecation.   Family Communication: none at this time     Objective Findings:  Vitals:   08/18/23 0448 08/18/23 0505 08/18/23 0736 08/18/23 1625  BP: (!) 109/55  109/64 117/63  Pulse: (!) 106  (!) 109 97  Resp: 16  20 16   Temp: 100.3 F (37.9 C)  98.7 F (37.1 C) 98.2 F (36.8 C)  TempSrc: Oral     SpO2: (!) 86% 95% 92% 91%  Weight:      Height:        Intake/Output Summary (Last 24 hours) at 08/18/2023 1715 Last data filed at 08/18/2023 1627 Gross per 24 hour  Intake 100 ml  Output 700 ml  Net -600 ml   Filed Weights    08/14/23 0500 08/15/23 0500 08/17/23 0500  Weight: 75.8 kg 76.6 kg 80.2 kg    Examination:  Physical Exam Constitutional:      General: He is not in acute distress.    Appearance: He is not ill-appearing.  Cardiovascular:     Rate and Rhythm: Normal rate and regular rhythm.  Pulmonary:     Effort: Pulmonary effort is normal.     Breath sounds: Normal breath sounds.  Abdominal:     General: Bowel sounds are normal.     Palpations: Abdomen is soft.  Musculoskeletal:     Right lower leg: No edema.     Left lower leg: No edema.  Skin:    General: Skin is warm and dry.  Neurological:     Mental Status: He is alert. Mental status is at baseline.  Psychiatric:        Mood and Affect: Mood normal.        Behavior: Behavior normal.          Scheduled Medications:   aspirin EC  81 mg Oral Daily   divalproex  500 mg Oral Daily   And   divalproex  1,000 mg Oral QHS   [START ON 08/19/2023] empagliflozin  10 mg Oral Daily   enoxaparin (LOVENOX) injection  40 mg Subcutaneous Q24H   escitalopram  5 mg Oral QHS   feeding supplement  237 mL Oral TID BM   fluticasone  2 spray Each Nare Daily   folic acid  1 mg Oral Q1200   gabapentin  100 mg Oral TID   multivitamin with minerals  1 tablet Oral Daily   pyridOXINE  100 mg Oral QHS   QUEtiapine  150 mg Oral QHS   QUEtiapine  50 mg Oral Daily   And   QUEtiapine  50 mg Oral Q1400   [START ON 08/19/2023] rosuvastatin  20 mg Oral Daily   sodium chloride flush  3 mL Intravenous Q12H   thiamine  100 mg Oral Q1200    Continuous Infusions:  cefTRIAXone (ROCEPHIN)  IV 2 g (08/18/23 1306)    PRN Medications:  acetaminophen **OR** acetaminophen, albuterol, ondansetron **OR** ondansetron (ZOFRAN) IV, polyethylene glycol  Antimicrobials from admission:  Anti-infectives (From admission, onward)    Start     Dose/Rate Route Frequency Ordered Stop   08/17/23 1000  azithromycin (ZITHROMAX) tablet 500 mg        500 mg Oral Daily 08/17/23  0727 08/17/23 0901   08/16/23 0200  metroNIDAZOLE (FLAGYL) IVPB 500 mg  Status:  Discontinued        500 mg 100 mL/hr over 60 Minutes Intravenous Every 12 hours 08/16/23 0034 08/17/23 0729   08/15/23 1330  cefTRIAXone (ROCEPHIN) 2 g in sodium chloride 0.9 % 100 mL IVPB        2 g 200 mL/hr over 30 Minutes Intravenous Every 24 hours 08/15/23 1236     08/15/23 1330  azithromycin (ZITHROMAX) 500 mg  in sodium chloride 0.9 % 250 mL IVPB  Status:  Discontinued        500 mg 250 mL/hr over 60 Minutes Intravenous Every 24 hours 08/15/23 1236 08/17/23 0727   07/30/23 1600  metroNIDAZOLE (FLAGYL) IVPB 500 mg  Status:  Discontinued        500 mg 100 mL/hr over 60 Minutes Intravenous Every 8 hours 07/30/23 1415 08/12/23 1854   07/30/23 1515  cefTRIAXone (ROCEPHIN) 2 g in sodium chloride 0.9 % 100 mL IVPB  Status:  Discontinued        2 g 200 mL/hr over 30 Minutes Intravenous Daily 07/30/23 1415 08/05/23 1254   07/29/23 1200  azithromycin (ZITHROMAX) 500 mg in sodium chloride 0.9 % 250 mL IVPB        500 mg 250 mL/hr over 60 Minutes Intravenous Every 24 hours 07/28/23 1353 08/01/23 1255   07/28/23 1500  ceFEPIme (MAXIPIME) 2 g in sodium chloride 0.9 % 100 mL IVPB  Status:  Discontinued        2 g 200 mL/hr over 30 Minutes Intravenous Every 12 hours 07/28/23 1354 07/30/23 1336   07/28/23 1100  cefTRIAXone (ROCEPHIN) 2 g in sodium chloride 0.9 % 100 mL IVPB        2 g 200 mL/hr over 30 Minutes Intravenous  Once 07/28/23 1057 07/28/23 1210   07/28/23 1100  azithromycin (ZITHROMAX) 500 mg in sodium chloride 0.9 % 250 mL IVPB        500 mg 250 mL/hr over 60 Minutes Intravenous  Once 07/28/23 1057 07/28/23 1255           Data Reviewed:  I have personally reviewed the following...  CBC: Recent Labs  Lab 08/14/23 0159 08/15/23 0926 08/16/23 0848 08/18/23 0503  WBC 6.3 6.2 5.9 6.8  NEUTROABS 3.5 3.7 4.1 4.4  HGB 10.6* 10.5* 8.4* 9.4*  HCT 30.7* 31.4* 24.2* 27.6*  MCV 93.6 97.8 97.2 95.8   PLT 286 281 223 240   Basic Metabolic Panel: Recent Labs  Lab 08/14/23 0159 08/15/23 0926 08/16/23 0848 08/18/23 0503  NA 125* 130* 132* 130*  K 4.6 4.4 4.1 4.1  CL 93* 99 103 97*  CO2 23 22 22 25   GLUCOSE 87 106* 86 103*  BUN 27* 26* 29* 20  CREATININE 0.88 1.06 1.05 0.76  CALCIUM 8.7* 8.7* 7.6* 8.8*   GFR: Estimated Creatinine Clearance: 85.5 mL/min (by C-G formula based on SCr of 0.76 mg/dL). Liver Function Tests: No results for input(s): "AST", "ALT", "ALKPHOS", "BILITOT", "PROT", "ALBUMIN" in the last 168 hours. No results for input(s): "LIPASE", "AMYLASE" in the last 168 hours. No results for input(s): "AMMONIA" in the last 168 hours. Coagulation Profile: No results for input(s): "INR", "PROTIME" in the last 168 hours. Cardiac Enzymes: No results for input(s): "CKTOTAL", "CKMB", "CKMBINDEX", "TROPONINI" in the last 168 hours. BNP (last 3 results) No results for input(s): "PROBNP" in the last 8760 hours. HbA1C: No results for input(s): "HGBA1C" in the last 72 hours. CBG: No results for input(s): "GLUCAP" in the last 168 hours. Lipid Profile: No results for input(s): "CHOL", "HDL", "LDLCALC", "TRIG", "CHOLHDL", "LDLDIRECT" in the last 72 hours. Thyroid Function Tests: No results for input(s): "TSH", "T4TOTAL", "FREET4", "T3FREE", "THYROIDAB" in the last 72 hours. Anemia Panel: No results for input(s): "VITAMINB12", "FOLATE", "FERRITIN", "TIBC", "IRON", "RETICCTPCT" in the last 72 hours. Most Recent Urinalysis On File:     Component Value Date/Time   COLORURINE YELLOW (A) 08/14/2023 0947   APPEARANCEUR CLEAR (A) 08/14/2023  0947   LABSPEC 1.017 08/14/2023 0947   PHURINE 7.0 08/14/2023 0947   GLUCOSEU NEGATIVE 08/14/2023 0947   HGBUR NEGATIVE 08/14/2023 0947   BILIRUBINUR NEGATIVE 08/14/2023 0947   KETONESUR NEGATIVE 08/14/2023 0947   PROTEINUR NEGATIVE 08/14/2023 0947   NITRITE NEGATIVE 08/14/2023 0947   LEUKOCYTESUR NEGATIVE 08/14/2023 0947   Sepsis  Labs: @LABRCNTIP (procalcitonin:4,lacticidven:4) Microbiology: Recent Results (from the past 240 hours)  Resp panel by RT-PCR (RSV, Flu A&B, Covid) Anterior Nasal Swab     Status: None   Collection Time: 08/15/23 11:59 PM   Specimen: Anterior Nasal Swab  Result Value Ref Range Status   SARS Coronavirus 2 by RT PCR NEGATIVE NEGATIVE Final    Comment: (NOTE) SARS-CoV-2 target nucleic acids are NOT DETECTED.  The SARS-CoV-2 RNA is generally detectable in upper respiratory specimens during the acute phase of infection. The lowest concentration of SARS-CoV-2 viral copies this assay can detect is 138 copies/mL. A negative result does not preclude SARS-Cov-2 infection and should not be used as the sole basis for treatment or other patient management decisions. A negative result may occur with  improper specimen collection/handling, submission of specimen other than nasopharyngeal swab, presence of viral mutation(s) within the areas targeted by this assay, and inadequate number of viral copies(<138 copies/mL). A negative result must be combined with clinical observations, patient history, and epidemiological information. The expected result is Negative.  Fact Sheet for Patients:  BloggerCourse.com  Fact Sheet for Healthcare Providers:  SeriousBroker.it  This test is no t yet approved or cleared by the [place] FDA and  has been authorized for detection and/or diagnosis of SARS-CoV-2 by FDA under an Emergency Use Authorization (EUA). This EUA will remain  in effect (meaning this test can be used) for the duration of the COVID-19 declaration under Section 564(b)(1) of the Act, 21 U.S.C.section 360bbb-3(b)(1), unless the authorization is terminated  or revoked sooner.       Influenza A by PCR NEGATIVE NEGATIVE Final   Influenza B by PCR NEGATIVE NEGATIVE Final    Comment: (NOTE) The Xpert Xpress SARS-CoV-2/FLU/RSV plus assay is  intended as an aid in the diagnosis of influenza from Nasopharyngeal swab specimens and should not be used as a sole basis for treatment. Nasal washings and aspirates are unacceptable for Xpert Xpress SARS-CoV-2/FLU/RSV testing.  Fact Sheet for Patients: BloggerCourse.com  Fact Sheet for Healthcare Providers: SeriousBroker.it  This test is not yet approved or cleared by the [place] FDA and has been authorized for detection and/or diagnosis of SARS-CoV-2 by FDA under an Emergency Use Authorization (EUA). This EUA will remain in effect (meaning this test can be used) for the duration of the COVID-19 declaration under Section 564(b)(1) of the Act, 21 U.S.C. section 360bbb-3(b)(1), unless the authorization is terminated or revoked.     Resp Syncytial Virus by PCR NEGATIVE NEGATIVE Final    Comment: (NOTE) Fact Sheet for Patients: BloggerCourse.com  Fact Sheet for Healthcare Providers: SeriousBroker.it  This test is not yet approved or cleared by the [place] FDA and has been authorized for detection and/or diagnosis of SARS-CoV-2 by FDA under an Emergency Use Authorization (EUA). This EUA will remain in effect (meaning this test can be used) for the duration of the COVID-19 declaration under Section 564(b)(1) of the Act, 21 U.S.C. section 360bbb-3(b)(1), unless the authorization is terminated or revoked.  Performed at [facility], [address] [place], [place] [address]   Culture, blood (Routine X 2) w Reflex to ID Panel  Status: None (Preliminary result)   Collection Time: 08/16/23  1:30 AM   Specimen: BLOOD  Result Value Ref Range Status   Specimen Description BLOOD BLOOD LEFT ARM  Final   Special Requests   Final    BOTTLES DRAWN AEROBIC AND ANAEROBIC Blood Culture adequate volume   Culture   Final    NO GROWTH 2 DAYS Performed at [facility], [address] [place], [place] [address]    Report Status PENDING  Incomplete  Culture, blood (Routine X 2) w Reflex to ID Panel     Status: None (Preliminary result)   Collection Time: 08/16/23  1:30 AM   Specimen: BLOOD  Result Value Ref Range Status   Specimen Description BLOOD BLOOD LEFT HAND  Final   Special Requests   Final    BOTTLES DRAWN AEROBIC ONLY Blood Culture results may not be optimal due to an inadequate volume of blood received in culture bottles   Culture   Final    NO GROWTH 2 DAYS Performed at [facility], [address], [place], [place] [address]    Report Status PENDING  Incomplete      Radiology Studies last 3 days: DG Chest Port 1 View Result Date: 08/15/2023 CLINICAL DATA:  Hypoxia. EXAM: PORTABLE CHEST 1 VIEW COMPARISON:  August 03, 2023. FINDINGS: The heart size and mediastinal contours are within normal limits. Left lung is clear. Right upper lobe airspace opacity is noted concerning for pneumonia with adjacent pleural thickening. Minimal right basilar subsegmental atelectasis is noted. The visualized skeletal structures are unremarkable. IMPRESSION: Right upper lobe airspace opacity is noted concerning for pneumonia with adjacent pleural thickening. Minimal right basilar subsegmental atelectasis is noted. Electronically Signed   By: Lupita Raider M.D.   On: 08/15/2023 11:19       Time spent: 35 min     Sunnie Nielsen, DO [facility] 08/18/2023, 5:15 PM    Dictation software may have been used to generate the above note. Typos may occur and escape review in typed/dictated notes. Please contact Dr Lyn Hollingshead directly for clarity if needed.  Staff may message me via secure chat in Epic  but this may not receive an immediate response,  please page me for urgent matters!  If 7PM-7AM, please contact night coverage [email or web address]

## 2023-08-19 DIAGNOSIS — J9601 Acute respiratory failure with hypoxia: Secondary | ICD-10-CM | POA: Diagnosis not present

## 2023-08-19 MED ORDER — POLYETHYLENE GLYCOL 3350 17 G PO PACK: 17.0000 g | PACK | Freq: Every day | ORAL | Status: DC | PRN

## 2023-08-19 MED ORDER — ADULT MULTIVITAMIN W/MINERALS CH: 1.0000 | ORAL_TABLET | Freq: Every day | ORAL | Status: DC

## 2023-08-19 MED ORDER — ONDANSETRON HCL 4 MG PO TABS: 4.0000 mg | ORAL_TABLET | Freq: Four times a day (QID) | ORAL | Status: DC | PRN

## 2023-08-19 MED ORDER — ENSURE ENLIVE PO LIQD: 237.0000 mL | Freq: Three times a day (TID) | ORAL | Status: DC

## 2023-08-19 MED ORDER — DIVALPROEX SODIUM ER 500 MG PO TB24: ORAL_TABLET | ORAL | Status: DC

## 2023-08-19 NOTE — Plan of Care (Signed)

## 2023-08-19 NOTE — Discharge Summary (Signed)
 Physician Discharge Summary   Patient: Paul Vaughan MRN: [id number]  DOB: [birth year]   Admit:     Date of Admission: 07/28/2023 Admitted from: ALF   Discharge: Date of discharge: 08/19/23 Disposition: Skilled nursing facility Condition at discharge: good  CODE STATUS: FULL CODE     Discharge Physician: Sunnie Nielsen, DO [facility]     PCP: Franciso Bend, NP  Recommendations for Outpatient Follow-up:  Follow up with PCP Franciso Bend, NP in 1-2 weeks  follow up BP, labs w/ BMP CBC, consider repeat CXR monitor pneumonia     Discharge Instructions     Diet general   Complete by: As directed    DIET DYS 3 Room service appropriate? Yes with Assist; Fluid consistency: Thin  Diet effective now      Comments: Extra Gravies on meats, potatoes.  NO STRAWS!! May have a baked/sweet potato per Speech ok.   Increase activity slowly   Complete by: As directed          Discharge Diagnoses: Principal Problem:   Acute hypoxic respiratory failure (HCC) Active Problems:   Severe sepsis (HCC)   Multifocal pneumonia   Acute kidney injury superimposed on CKD (HCC)   Elevated LFTs   Chronic obstructive pulmonary disease, unspecified (HCC)   Essential (primary) hypertension   Dementia with behavioral disturbance (HCC)   Chronic diastolic CHF (congestive heart failure) (HCC)   Rhabdomyolysis        Hospital course / significant events:  68 y.o. male with medical history significant of dementia, CKD stage IIIa, COPD, CHF, who presents to the ED due to generalized weakness. 03/16: admitted to hospitalist service , tx pneumonia and rhabdo.  03/18: respiratory status worsening, to stepdown and pulmonology consulted  03/19: respiratory status stabilized, oxygen sats stable on 4 L/min. Stable to transfer to med/tele floor this afternoon.  03/22: weaned to 3L O2, stable awaiting SNF. Pulmonary s/o 04/03: resume abx for pneumonia  03/25 bed search,  PASSR pending. Awaiting DSS guardian consent for placement, bed offer accepted 04/01, PASSR still pending but finalized 04/03, now holding thru weekend  to [facility] 04/07     Consultants:  Pulmonology   Procedures/Surgeries: none      ASSESSMENT & PLAN:   Acute hypoxic respiratory failure d/t pneumonia - resolved Continue on supportive O2 as needed up to 4L/min   Hyponatremia likely secondary to hypovolemia due to poor oral intake - improved Monitor BMP   Severe sepsis d/t pneumonia - sepsis resolved  Multifocal pneumonia Completed repeat round abx   Hypokalemia -improved Monitor BMP   AKI on CKD stage IIIa - resolved  Monitor renal function   Elevated LFTs - improved Likely secondary to sepsis Monitor LFT    Chronic obstructive pulmonary disease, unspecified Continue as needed nebulization Pulmonology consulted, have signed off, follow outpatient    Rhabdomyolysis No falls at home.  PT evaluation.   Resolved with IV fluids. Recheck as needed   Chronic diastolic CHF Currently no signs of heart failure.  Watch closely with gentle fluids.  Echo back in 2024 showed EF greater than 55%. 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 Continue Depakote, Lexapro, Seroquel   Essential (primary) hypertension Systolic BP's in 865'H to 150's.  BP meds have been on hold. Holding blood pressure medication given relative hypotension  overweight based on BMI: Body mass index is 26.88 kg/m.  Underweight - under 18  overweight - 25 to 29 obese - 30 or more Class 1 obesity: BMI of 30.0 to 34 Class 2 obesity: BMI of 35.0 to 39 Class 3 obesity: BMI of 40.0 to 49 Super Morbid Obesity: BMI 50-59 Super-super Morbid Obesity: BMI 60+ Significantly low or high BMI is associated with  higher medical risk.  Weight management advised as adjunct to other disease management and risk reduction treatments    DVT prophylaxis: lovenox  IV fluids: no continuous IV fluids  Nutrition: dysphagia diet Central lines / other devices: none  Code Status: FULL CODE ACP documentation reviewed:  none on file in [facility]  TOC needs: placement Medical barriers to dispo: none. Expected for discharge tomorrow.             Discharge Instructions  Allergies as of 08/19/2023       Reactions   Penicillins Rash, Swelling   .Has patient had a PCN reaction causing immediate rash, facial/tongue/throat swelling, SOB or lightheadedness with hypotension: Unknown Has patient had a PCN reaction causing severe rash involving mucus membranes or skin necrosis: Unknown Has patient had a PCN reaction that required hospitalization: Unknown Has patient had a PCN reaction occurring within the last 10 years: Unknown If all of the above answers are "NO", then may proceed with Cephalosporin use.   Sulfa Antibiotics Rash, Swelling        Medication List     STOP taking these medications    lisinopril 5 MG tablet Commonly known as: ZESTRIL   memantine 5 MG tablet Commonly known as: NAMENDA       TAKE these medications    albuterol 108 (90 Base) MCG/ACT inhaler Commonly known as: VENTOLIN HFA Inhale 2 puffs into the lungs every 6 (six) hours as needed for wheezing or shortness of breath.   albuterol (2.5 MG/3ML) 0.083% nebulizer solution Commonly known as: PROVENTIL Take 2.5 mg by nebulization QID.   aspirin EC 81 MG tablet Take 81 mg by mouth in the morning. Swallow whole.   divalproex 500 MG 24 hr tablet Commonly known as: DEPAKOTE ER 1 tablet (500 mg) every morning and 2 tablets (1000 mg) at bedtime What changed:  how much to take how to take this when to take this additional instructions   escitalopram 5 MG tablet Commonly known as: LEXAPRO Take 5 mg by mouth at  bedtime.   feeding supplement Liqd Take 237 mLs by mouth 3 (three) times daily between meals.   folic acid 1 MG tablet Commonly known as: FOLVITE Take 1 mg by mouth in the morning.   gabapentin 100 MG capsule Commonly known as: NEURONTIN Take 100 mg by mouth 3 (three) times daily.   Iron Slow Release 142 (45 Fe) MG Tbcr tablet Generic drug: ferrous sulfate ER Take 45 mg by mouth in the morning.   Jardiance 10 MG Tabs tablet Generic drug: empagliflozin Take 10 mg by mouth in the morning.   multivitamin with minerals Tabs tablet Take 1 tablet by mouth daily.   ondansetron 4 MG tablet Commonly known as: ZOFRAN Take 1 tablet (4 mg total) by mouth every 6 (six) hours as needed for nausea.   polyethylene glycol 17 g packet Commonly known as: MIRALAX / GLYCOLAX Take 17 g by mouth daily as needed for mild constipation.   Praluent 150 MG/ML Soaj Generic drug: Alirocumab Inject 150 mg into the skin as directed. Every 2 weeks in  the morning   pyridOXINE 100 MG tablet Commonly known as: VITAMIN B6 Take 100 mg by mouth at bedtime.   QUEtiapine 100 MG tablet Commonly known as: SEROQUEL Take 150 mg by mouth at bedtime.   QUEtiapine 50 MG tablet Commonly known as: SEROQUEL Take 50 mg by mouth 2 (two) times daily. 50 mg in the morning (2) 50 mg at 2 pm   rosuvastatin 20 MG tablet Commonly known as: CRESTOR Take 20 mg by mouth at bedtime.   thiamine 100 MG tablet Commonly known as: VITAMIN B1 Take 100 mg by mouth in the morning.         Contact information for after-discharge care     Destination     [facility] Preferred SNF .   Service: Skilled Nursing Contact information: [address] [place] [address] [phone]                     Allergies  Allergen Reactions   Penicillins Rash and Swelling    .Has patient had a PCN reaction causing immediate rash, facial/tongue/throat swelling, SOB or  lightheadedness with hypotension: Unknown Has patient had a PCN reaction causing severe rash involving mucus membranes or skin necrosis: Unknown Has patient had a PCN reaction that required hospitalization: Unknown Has patient had a PCN reaction occurring within the last 10 years: Unknown If all of the above answers are "NO", then may proceed with Cephalosporin use.    Sulfa Antibiotics Rash and Swelling     Subjective: pt reports no concerns today, tolerating diet, denied CP/SOB, reports feeling a bit weak generalized, no focal weakness, no headache/vision changes    Discharge Exam: BP (!) 132/59 (BP Location: Right Arm)   Pulse 99   Temp 98.7 F (37.1 C)   Resp 18   Ht 5\' 8"  (1.727 m)   Wt 77 kg   SpO2 90%   BMI 25.81 kg/m  General: Pt is alert, awake, not in acute distress Cardiovascular: RRR, S1/S2 +, no rubs, no gallops Respiratory: poor inspiratory effort but lung sound clear  Abdominal: Soft, NT, ND, bowel sounds + Extremities: no edema, no cyanosis     The results of significant diagnostics from this hospitalization (including imaging, microbiology, ancillary and laboratory) are listed below for reference.     Microbiology: Recent Results (from the past 240 hours)  Resp panel by RT-PCR (RSV, Flu A&B, Covid) Anterior Nasal Swab     Status: None   Collection Time: 08/15/23 11:59 PM   Specimen: Anterior Nasal Swab  Result Value Ref Range Status   SARS Coronavirus 2 by RT PCR NEGATIVE NEGATIVE Final    Comment: (NOTE) SARS-CoV-2 target nucleic acids are NOT DETECTED.  The SARS-CoV-2 RNA is generally detectable in upper respiratory specimens during the acute phase of infection. The lowest concentration of SARS-CoV-2 viral copies this assay can detect is 138 copies/mL. A negative result does not preclude SARS-Cov-2 infection and should not be used as the sole basis for treatment or other patient management decisions. A negative result may occur with  improper  specimen collection/handling, submission of specimen other than nasopharyngeal swab, presence of viral mutation(s) within the areas targeted by this assay, and inadequate number of viral copies(<138 copies/mL). A negative result must be combined with clinical observations, patient history, and epidemiological information. The expected result is Negative.  Fact Sheet for Patients:  BloggerCourse.com  Fact Sheet for Healthcare Providers:  SeriousBroker.it  This test is no t yet  approved or cleared by the [place] and  has been authorized for detection and/or diagnosis of SARS-CoV-2 by FDA under an Emergency Use Authorization (EUA). This EUA will remain  in effect (meaning this test can be used) for the duration of the COVID-19 declaration under Section 564(b)(1) of the Act, 21 U.S.C.section 360bbb-3(b)(1), unless the authorization is terminated  or revoked sooner.       Influenza A by PCR NEGATIVE NEGATIVE Final   Influenza B by PCR NEGATIVE NEGATIVE Final    Comment: (NOTE) The Xpert Xpress SARS-CoV-2/FLU/RSV plus assay is intended as an aid in the diagnosis of influenza from Nasopharyngeal swab specimens and should not be used as a sole basis for treatment. Nasal washings and aspirates are unacceptable for Xpert Xpress SARS-CoV-2/FLU/RSV testing.  Fact Sheet for Patients: BloggerCourse.com  Fact Sheet for Healthcare Providers: SeriousBroker.it  This test is not yet approved or cleared by the [place] FDA and has been authorized for detection and/or diagnosis of SARS-CoV-2 by FDA under an Emergency Use Authorization (EUA). This EUA will remain in effect (meaning this test can be used) for the duration of the COVID-19 declaration under Section 564(b)(1) of the Act, 21 U.S.C. section 360bbb-3(b)(1), unless the authorization is terminated or revoked.     Resp  Syncytial Virus by PCR NEGATIVE NEGATIVE Final    Comment: (NOTE) Fact Sheet for Patients: BloggerCourse.com  Fact Sheet for Healthcare Providers: SeriousBroker.it  This test is not yet approved or cleared by the [place] FDA and has been authorized for detection and/or diagnosis of SARS-CoV-2 by FDA under an Emergency Use Authorization (EUA). This EUA will remain in effect (meaning this test can be used) for the duration of the COVID-19 declaration under Section 564(b)(1) of the Act, 21 U.S.C. section 360bbb-3(b)(1), unless the authorization is terminated or revoked.  Performed at [facility], [address] [place], [place] [address]   Culture, blood (Routine X 2) w Reflex to ID Panel     Status: None (Preliminary result)   Collection Time: 08/16/23  1:30 AM   Specimen: BLOOD  Result Value Ref Range Status   Specimen Description BLOOD BLOOD LEFT ARM  Final   Special Requests   Final    BOTTLES DRAWN AEROBIC AND ANAEROBIC Blood Culture adequate volume   Culture   Final    NO GROWTH 3 DAYS Performed at [facility], [address] [place], [place] [address]    Report Status PENDING  Incomplete  Culture, blood (Routine X 2) w Reflex to ID Panel     Status: None (Preliminary result)   Collection Time: 08/16/23  1:30 AM   Specimen: BLOOD  Result Value Ref Range Status   Specimen Description BLOOD BLOOD LEFT HAND  Final   Special Requests   Final    BOTTLES DRAWN AEROBIC ONLY Blood Culture results may not be optimal due to an inadequate volume of blood received in culture bottles   Culture   Final    NO GROWTH 3 DAYS Performed at [facility], [address] [place], [place] [address]    Report Status PENDING  Incomplete     Labs: BNP (last 3 results) No results for input(s): "BNP" in the last 8760 hours. Basic Metabolic Panel: Recent Labs  Lab 08/14/23 0159 08/15/23 0926  08/16/23 0848 08/18/23 0503  NA 125* 130* 132* 130*  K 4.6 4.4 4.1 4.1  CL 93* 99 103 97*  CO2 23 22 22 25   GLUCOSE 87  106* 86 103*  BUN 27* 26* 29* 20  CREATININE 0.88 1.06 1.05 0.76  CALCIUM 8.7* 8.7* 7.6* 8.8*   Liver Function Tests: No results for input(s): "AST", "ALT", "ALKPHOS", "BILITOT", "PROT", "ALBUMIN" in the last 168 hours. No results for input(s): "LIPASE", "AMYLASE" in the last 168 hours. No results for input(s): "AMMONIA" in the last 168 hours. CBC: Recent Labs  Lab 08/14/23 0159 08/15/23 0926 08/16/23 0848 08/18/23 0503  WBC 6.3 6.2 5.9 6.8  NEUTROABS 3.5 3.7 4.1 4.4  HGB 10.6* 10.5* 8.4* 9.4*  HCT 30.7* 31.4* 24.2* 27.6*  MCV 93.6 97.8 97.2 95.8  PLT 286 281 223 240   Cardiac Enzymes: No results for input(s): "CKTOTAL", "CKMB", "CKMBINDEX", "TROPONINI" in the last 168 hours. BNP: Invalid input(s): "POCBNP" CBG: No results for input(s): "GLUCAP" in the last 168 hours. D-Dimer No results for input(s): "DDIMER" in the last 72 hours. Hgb A1c No results for input(s): "HGBA1C" in the last 72 hours. Lipid Profile No results for input(s): "CHOL", "HDL", "LDLCALC", "TRIG", "CHOLHDL", "LDLDIRECT" in the last 72 hours. Thyroid function studies No results for input(s): "TSH", "T4TOTAL", "T3FREE", "THYROIDAB" in the last 72 hours.  Invalid input(s): "FREET3" Anemia work up No results for input(s): "VITAMINB12", "FOLATE", "FERRITIN", "TIBC", "IRON", "RETICCTPCT" in the last 72 hours. Urinalysis    Component Value Date/Time   COLORURINE YELLOW (A) 08/14/2023 0947   APPEARANCEUR CLEAR (A) 08/14/2023 0947   LABSPEC 1.017 08/14/2023 0947   PHURINE 7.0 08/14/2023 0947   GLUCOSEU NEGATIVE 08/14/2023 0947   HGBUR NEGATIVE 08/14/2023 0947   BILIRUBINUR NEGATIVE 08/14/2023 0947   KETONESUR NEGATIVE 08/14/2023 0947   PROTEINUR NEGATIVE 08/14/2023 0947   NITRITE NEGATIVE 08/14/2023 0947   LEUKOCYTESUR NEGATIVE 08/14/2023 0947   Sepsis Labs Recent Labs  Lab  08/14/23 0159 08/15/23 0926 08/16/23 0848 08/18/23 0503  WBC 6.3 6.2 5.9 6.8   Microbiology Recent Results (from the past 240 hours)  Resp panel by RT-PCR (RSV, Flu A&B, Covid) Anterior Nasal Swab     Status: None   Collection Time: 08/15/23 11:59 PM   Specimen: Anterior Nasal Swab  Result Value Ref Range Status   SARS Coronavirus 2 by RT PCR NEGATIVE NEGATIVE Final    Comment: (NOTE) SARS-CoV-2 target nucleic acids are NOT DETECTED.  The SARS-CoV-2 RNA is generally detectable in upper respiratory specimens during the acute phase of infection. The lowest concentration of SARS-CoV-2 viral copies this assay can detect is 138 copies/mL. A negative result does not preclude SARS-Cov-2 infection and should not be used as the sole basis for treatment or other patient management decisions. A negative result may occur with  improper specimen collection/handling, submission of specimen other than nasopharyngeal swab, presence of viral mutation(s) within the areas targeted by this assay, and inadequate number of viral copies(<138 copies/mL). A negative result must be combined with clinical observations, patient history, and epidemiological information. The expected result is Negative.  Fact Sheet for Patients:  BloggerCourse.com  Fact Sheet for Healthcare Providers:  SeriousBroker.it  This test is no t yet approved or cleared by the [place] FDA and  has been authorized for detection and/or diagnosis of SARS-CoV-2 by FDA under an Emergency Use Authorization (EUA). This EUA will remain  in effect (meaning this test can be used) for the duration of the COVID-19 declaration under Section 564(b)(1) of the Act, 21 U.S.C.section 360bbb-3(b)(1), unless the authorization is terminated  or revoked sooner.       Influenza A by PCR NEGATIVE NEGATIVE Final  Influenza B by PCR NEGATIVE NEGATIVE Final    Comment: (NOTE) The Xpert Xpress  SARS-CoV-2/FLU/RSV plus assay is intended as an aid in the diagnosis of influenza from Nasopharyngeal swab specimens and should not be used as a sole basis for treatment. Nasal washings and aspirates are unacceptable for Xpert Xpress SARS-CoV-2/FLU/RSV testing.  Fact Sheet for Patients: BloggerCourse.com  Fact Sheet for Healthcare Providers: SeriousBroker.it  This test is not yet approved or cleared by the [place] FDA and has been authorized for detection and/or diagnosis of SARS-CoV-2 by FDA under an Emergency Use Authorization (EUA). This EUA will remain in effect (meaning this test can be used) for the duration of the COVID-19 declaration under Section 564(b)(1) of the Act, 21 U.S.C. section 360bbb-3(b)(1), unless the authorization is terminated or revoked.     Resp Syncytial Virus by PCR NEGATIVE NEGATIVE Final    Comment: (NOTE) Fact Sheet for Patients: BloggerCourse.com  Fact Sheet for Healthcare Providers: SeriousBroker.it  This test is not yet approved or cleared by the [place] FDA and has been authorized for detection and/or diagnosis of SARS-CoV-2 by FDA under an Emergency Use Authorization (EUA). This EUA will remain in effect (meaning this test can be used) for the duration of the COVID-19 declaration under Section 564(b)(1) of the Act, 21 U.S.C. section 360bbb-3(b)(1), unless the authorization is terminated or revoked.  Performed at [facility], [address] [place], [place] [address]   Culture, blood (Routine X 2) w Reflex to ID Panel     Status: None (Preliminary result)   Collection Time: 08/16/23  1:30 AM   Specimen: BLOOD  Result Value Ref Range Status   Specimen Description BLOOD BLOOD LEFT ARM  Final   Special Requests   Final    BOTTLES DRAWN AEROBIC AND ANAEROBIC Blood Culture adequate volume   Culture   Final    NO GROWTH 3  DAYS Performed at [facility], [address] [place], [place] [address]    Report Status PENDING  Incomplete  Culture, blood (Routine X 2) w Reflex to ID Panel     Status: None (Preliminary result)   Collection Time: 08/16/23  1:30 AM   Specimen: BLOOD  Result Value Ref Range Status   Specimen Description BLOOD BLOOD LEFT HAND  Final   Special Requests   Final    BOTTLES DRAWN AEROBIC ONLY Blood Culture results may not be optimal due to an inadequate volume of blood received in culture bottles   Culture   Final    NO GROWTH 3 DAYS Performed at [facility], [address], [place], [place] [address]    Report Status PENDING  Incomplete   Imaging CT ABDOMEN PELVIS W CONTRAST Result Date: 07/28/2023 CLINICAL DATA:  Diarrhea, lower abdominal pain, stool in urinary incontinence. EXAM: CT ABDOMEN AND PELVIS WITH CONTRAST TECHNIQUE: Multidetector CT imaging of the abdomen and pelvis was performed using the standard protocol following bolus administration of intravenous contrast. RADIATION DOSE REDUCTION: This exam was performed according to the departmental dose-optimization program which includes automated exposure control, adjustment of the mA and/or kV according to patient size and/or use of iterative reconstruction technique. CONTRAST:  75mL OMNIPAQUE IOHEXOL 300 MG/ML  SOLN COMPARISON:  None. FINDINGS: Lower chest: Trace right pleural fluid. Increased peribronchovascular opacities within the right lower lung compatible with sequelae of inflammation or infection. No lobar consolidation. Hepatobiliary: No focal liver abnormality is seen. No gallstones, gallbladder wall thickening, or biliary dilatation. Pancreas: Unremarkable. No pancreatic ductal  dilatation or surrounding inflammatory changes. Spleen: Normal in size without focal abnormality. Adrenals/Urinary Tract: Normal adrenal glands. Anterior left upper pole renal cyst measures 3.2 cm, image 29/2. No follow-up imaging  recommended. No nephrolithiasis or obstructive uropathy. Urinary bladder appears normal. Stomach/Bowel: Stomach appears nondistended. The appendix is visualized and appears normal. No pathologic dilatation of the large or small bowel loops. No bowel wall thickening, inflammation, or distension. Sigmoid diverticulosis without signs of acute diverticulitis. Vascular/Lymphatic: Aortic atherosclerosis. The upper abdominal vascularity appears patent. No signs of abdominopelvic adenopathy. Reproductive: Prostate is unremarkable. Other: There is no free fluid or fluid collections. No signs of pneumoperitoneum. Musculoskeletal: No acute or significant osseous findings. IMPRESSION: 1. No acute findings within the abdomen or pelvis. 2. Sigmoid diverticulosis without signs of acute diverticulitis. 3. Trace right pleural fluid. 4. Increased peribronchovascular opacities within the right lower lung compatible with sequelae of inflammation/infection versus chronic aspiration. 5.  Aortic Atherosclerosis (ICD10-I70.0). Electronically Signed   By: Signa Kell M.D.   On: 07/28/2023 12:08   DG Chest 2 View Result Date: 07/28/2023 CLINICAL DATA:  Suspected sepsis. Poor appetite. Generalized weakness. Episodes of urinary incontinence. Intermittent altered mental status. EXAM: CHEST - 2 VIEW COMPARISON:  Chest radiograph 07/28/2021 and 12/13/2019, CT cervical spine 11/17/2019 FINDINGS: Cardiac silhouette and mediastinal contours are within normal limits. Mild-to-moderate atherosclerotic calcification within the aortic arch. There are mildly to moderately decreased lung volumes. There is chronic bilateral interstitial thickening and reticular opacification within the peripheral right-greater-than-left lungs, greatest within the superolateral right lung and next most severe within the inferolateral right lung. This is in the same distribution as on 06/30/2021 and 12/13/2019, however the opacities are worsened from prior. On  11/17/2019 cervical spine radiographs high-grade emphysematous changes and interlobular septal thickening is seen within the minimally visualized lung apices. No pleural effusion or pneumothorax. Mild multilevel degenerative disc changes of the thoracic spine. Mild elevation of the right hemidiaphragm is unchanged. IMPRESSION: Chronic bilateral interstitial thickening and reticular opacification within the peripheral right-greater-than-left lungs, greatest within the superolateral right lung. This is in the same distribution as interstitial thickening and airspace opacity on 06/30/2021 and 12/13/2019, however the opacities are worsened from prior. This may represent worsening chronic interstitial lung disease. It is difficult to exclude a superimposed right apical pneumonia. Electronically Signed   By: Neita Garnet M.D.   On: 07/28/2023 11:13      Time coordinating discharge: over 30 minutes  SIGNED:  Sunnie Nielsen DO [facility]

## 2023-08-19 NOTE — TOC Progression Note (Signed)
 Transition of Care [facility]) - Progression Note    Patient Details  Name: Paul Vaughan MRN: [id number] Date of Birth: 1956/02/17  Transition of Care [facility]) CM/SW Contact  Marlowe Sax, RN Phone Number: 08/19/2023, 11:18 AM  Clinical Narrative:     Called EMS [facility] to transport to [facility]   Expected Discharge Plan:  (TBD) Barriers to Discharge: No Barriers Identified  Expected Discharge Plan and Services   Discharge Planning Services: CM Consult   Living arrangements for the past 2 months: Group Home (Patient from [facility] and [facility].) Expected Discharge Date: 08/19/23                                     Social Determinants of Health (SDOH) Interventions SDOH Screenings   Food Insecurity: No Food Insecurity (07/29/2023)  Housing: Low Risk  (07/29/2023)  Transportation Needs: No Transportation Needs (07/29/2023)  Utilities: Not At Risk (07/29/2023)  Social Connections: Unknown (07/29/2023)  Tobacco Use: High Risk (07/29/2023)    Readmission Risk Interventions    07/01/2021    1:02 PM  Readmission Risk Prevention Plan  Transportation Screening Complete  PCP or Specialist Appt within 5-7 Days Complete  Home Care Screening Complete  Medication Review (RN CM) Complete

## 2023-08-19 NOTE — Progress Notes (Signed)
 Called report to [facility] and spoke with Alvira Philips, LPN.   Also, Legal Guardian Zy'Anna at [facility] DSS notified of discharge at ~1050

## 2023-08-21 LAB — CULTURE, BLOOD (ROUTINE X 2)
Culture: NO GROWTH
Culture: NO GROWTH
Special Requests: ADEQUATE

## 2023-08-31 ENCOUNTER — Emergency Department

## 2023-08-31 ENCOUNTER — Other Ambulatory Visit: Payer: Self-pay

## 2023-08-31 ENCOUNTER — Inpatient Hospital Stay
Admission: EM | Admit: 2023-08-31 | Discharge: 2023-10-13 | DRG: 870 | Disposition: E | Source: Skilled Nursing Facility | Attending: Internal Medicine | Admitting: Internal Medicine

## 2023-08-31 DIAGNOSIS — E872 Acidosis, unspecified: Secondary | ICD-10-CM | POA: Diagnosis present

## 2023-08-31 DIAGNOSIS — J690 Pneumonitis due to inhalation of food and vomit: Secondary | ICD-10-CM | POA: Diagnosis present

## 2023-08-31 DIAGNOSIS — N182 Chronic kidney disease, stage 2 (mild): Secondary | ICD-10-CM | POA: Diagnosis present

## 2023-08-31 DIAGNOSIS — R748 Abnormal levels of other serum enzymes: Secondary | ICD-10-CM | POA: Diagnosis present

## 2023-08-31 DIAGNOSIS — E440 Moderate protein-calorie malnutrition: Secondary | ICD-10-CM | POA: Diagnosis present

## 2023-08-31 DIAGNOSIS — I10 Essential (primary) hypertension: Secondary | ICD-10-CM | POA: Diagnosis present

## 2023-08-31 DIAGNOSIS — E785 Hyperlipidemia, unspecified: Secondary | ICD-10-CM | POA: Diagnosis not present

## 2023-08-31 DIAGNOSIS — I5032 Chronic diastolic (congestive) heart failure: Secondary | ICD-10-CM | POA: Diagnosis not present

## 2023-08-31 DIAGNOSIS — F32A Depression, unspecified: Secondary | ICD-10-CM | POA: Diagnosis present

## 2023-08-31 DIAGNOSIS — I5043 Acute on chronic combined systolic (congestive) and diastolic (congestive) heart failure: Secondary | ICD-10-CM | POA: Diagnosis present

## 2023-08-31 DIAGNOSIS — J189 Pneumonia, unspecified organism: Secondary | ICD-10-CM | POA: Diagnosis present

## 2023-08-31 DIAGNOSIS — I130 Hypertensive heart and chronic kidney disease with heart failure and stage 1 through stage 4 chronic kidney disease, or unspecified chronic kidney disease: Secondary | ICD-10-CM | POA: Diagnosis present

## 2023-08-31 DIAGNOSIS — Z515 Encounter for palliative care: Secondary | ICD-10-CM | POA: Diagnosis not present

## 2023-08-31 DIAGNOSIS — F4325 Adjustment disorder with mixed disturbance of emotions and conduct: Secondary | ICD-10-CM | POA: Diagnosis not present

## 2023-08-31 DIAGNOSIS — E1122 Type 2 diabetes mellitus with diabetic chronic kidney disease: Secondary | ICD-10-CM | POA: Diagnosis present

## 2023-08-31 DIAGNOSIS — A419 Sepsis, unspecified organism: Secondary | ICD-10-CM | POA: Diagnosis present

## 2023-08-31 DIAGNOSIS — M6282 Rhabdomyolysis: Secondary | ICD-10-CM | POA: Diagnosis present

## 2023-08-31 DIAGNOSIS — J90 Pleural effusion, not elsewhere classified: Secondary | ICD-10-CM | POA: Diagnosis not present

## 2023-08-31 DIAGNOSIS — J9601 Acute respiratory failure with hypoxia: Secondary | ICD-10-CM | POA: Diagnosis not present

## 2023-08-31 DIAGNOSIS — Z7984 Long term (current) use of oral hypoglycemic drugs: Secondary | ICD-10-CM

## 2023-08-31 DIAGNOSIS — R7989 Other specified abnormal findings of blood chemistry: Secondary | ICD-10-CM | POA: Diagnosis not present

## 2023-08-31 DIAGNOSIS — R6520 Severe sepsis without septic shock: Secondary | ICD-10-CM | POA: Diagnosis present

## 2023-08-31 DIAGNOSIS — E870 Hyperosmolality and hypernatremia: Secondary | ICD-10-CM | POA: Diagnosis not present

## 2023-08-31 DIAGNOSIS — Z7189 Other specified counseling: Secondary | ICD-10-CM | POA: Diagnosis not present

## 2023-08-31 DIAGNOSIS — W44F9XA Other object of natural or organic material, entering into or through a natural orifice, initial encounter: Secondary | ICD-10-CM | POA: Diagnosis present

## 2023-08-31 DIAGNOSIS — J9621 Acute and chronic respiratory failure with hypoxia: Secondary | ICD-10-CM

## 2023-08-31 DIAGNOSIS — J9811 Atelectasis: Secondary | ICD-10-CM | POA: Diagnosis present

## 2023-08-31 DIAGNOSIS — T17590A Other foreign object in bronchus causing asphyxiation, initial encounter: Secondary | ICD-10-CM | POA: Diagnosis present

## 2023-08-31 DIAGNOSIS — Z7901 Long term (current) use of anticoagulants: Secondary | ICD-10-CM

## 2023-08-31 DIAGNOSIS — R4183 Borderline intellectual functioning: Secondary | ICD-10-CM

## 2023-08-31 DIAGNOSIS — F17210 Nicotine dependence, cigarettes, uncomplicated: Secondary | ICD-10-CM | POA: Diagnosis present

## 2023-08-31 DIAGNOSIS — N179 Acute kidney failure, unspecified: Secondary | ICD-10-CM | POA: Diagnosis present

## 2023-08-31 DIAGNOSIS — R6510 Systemic inflammatory response syndrome (SIRS) of non-infectious origin without acute organ dysfunction: Secondary | ICD-10-CM | POA: Diagnosis not present

## 2023-08-31 DIAGNOSIS — G928 Other toxic encephalopathy: Secondary | ICD-10-CM | POA: Diagnosis present

## 2023-08-31 DIAGNOSIS — Z1152 Encounter for screening for COVID-19: Secondary | ICD-10-CM

## 2023-08-31 DIAGNOSIS — J440 Chronic obstructive pulmonary disease with acute lower respiratory infection: Secondary | ICD-10-CM | POA: Diagnosis present

## 2023-08-31 DIAGNOSIS — R627 Adult failure to thrive: Secondary | ICD-10-CM | POA: Diagnosis present

## 2023-08-31 DIAGNOSIS — Z880 Allergy status to penicillin: Secondary | ICD-10-CM

## 2023-08-31 DIAGNOSIS — K922 Gastrointestinal hemorrhage, unspecified: Secondary | ICD-10-CM | POA: Diagnosis present

## 2023-08-31 DIAGNOSIS — D62 Acute posthemorrhagic anemia: Secondary | ICD-10-CM | POA: Diagnosis present

## 2023-08-31 DIAGNOSIS — F819 Developmental disorder of scholastic skills, unspecified: Secondary | ICD-10-CM | POA: Diagnosis present

## 2023-08-31 DIAGNOSIS — Z66 Do not resuscitate: Secondary | ICD-10-CM | POA: Diagnosis present

## 2023-08-31 DIAGNOSIS — F0393 Unspecified dementia, unspecified severity, with mood disturbance: Secondary | ICD-10-CM | POA: Diagnosis present

## 2023-08-31 DIAGNOSIS — Z79899 Other long term (current) drug therapy: Secondary | ICD-10-CM

## 2023-08-31 DIAGNOSIS — I2699 Other pulmonary embolism without acute cor pulmonale: Secondary | ICD-10-CM | POA: Diagnosis present

## 2023-08-31 DIAGNOSIS — R6250 Unspecified lack of expected normal physiological development in childhood: Secondary | ICD-10-CM | POA: Diagnosis present

## 2023-08-31 DIAGNOSIS — R042 Hemoptysis: Secondary | ICD-10-CM | POA: Diagnosis present

## 2023-08-31 DIAGNOSIS — R0602 Shortness of breath: Secondary | ICD-10-CM | POA: Diagnosis present

## 2023-08-31 DIAGNOSIS — E11649 Type 2 diabetes mellitus with hypoglycemia without coma: Secondary | ICD-10-CM | POA: Diagnosis not present

## 2023-08-31 DIAGNOSIS — R1310 Dysphagia, unspecified: Secondary | ICD-10-CM | POA: Diagnosis present

## 2023-08-31 DIAGNOSIS — E1165 Type 2 diabetes mellitus with hyperglycemia: Secondary | ICD-10-CM | POA: Diagnosis present

## 2023-08-31 DIAGNOSIS — Z882 Allergy status to sulfonamides status: Secondary | ICD-10-CM

## 2023-08-31 DIAGNOSIS — Z7982 Long term (current) use of aspirin: Secondary | ICD-10-CM

## 2023-08-31 LAB — TROPONIN I (HIGH SENSITIVITY)
Troponin I (High Sensitivity): 14 ng/L (ref <18)
Troponin I (High Sensitivity): 12 ng/L (ref <18)

## 2023-08-31 LAB — URINALYSIS, ROUTINE W REFLEX MICROSCOPIC
Bacteria, UA: NONE SEEN
Bilirubin Urine: NEGATIVE
Glucose, UA: >=500 mg/dL — AB
Hgb urine dipstick: NEGATIVE
Ketones, ur: NEGATIVE mg/dL
Leukocytes,Ua: NEGATIVE
Nitrite: NEGATIVE
Protein, ur: NEGATIVE mg/dL
Specific Gravity, Urine: 1.037 — ABNORMAL HIGH (ref 1.005–1.030)
pH: 5 (ref 5.0–8.0)

## 2023-08-31 LAB — COMPREHENSIVE METABOLIC PANEL WITH GFR
ALT: 22 U/L (ref 0–44)
AST: 46 U/L — ABNORMAL HIGH (ref 15–41)
Albumin: 2.9 g/dL — ABNORMAL LOW (ref 3.5–5.0)
Alkaline Phosphatase: 49 U/L (ref 38–126)
Anion gap: 13 (ref 5–15)
BUN: 36 mg/dL — ABNORMAL HIGH (ref 8–23)
CO2: 23 mmol/L (ref 22–32)
Calcium: 9.5 mg/dL (ref 8.9–10.3)
Chloride: 100 mmol/L (ref 98–111)
Creatinine, Ser: 1.39 mg/dL — ABNORMAL HIGH (ref 0.61–1.24)
GFR, Estimated: 55 mL/min — ABNORMAL LOW (ref >60)
Glucose, Bld: 126 mg/dL — ABNORMAL HIGH (ref 70–99)
Potassium: 5 mmol/L (ref 3.5–5.1)
Sodium: 136 mmol/L (ref 135–145)
Total Bilirubin: 0.7 mg/dL (ref 0.0–1.2)
Total Protein: 8 g/dL (ref 6.5–8.1)

## 2023-08-31 LAB — CBC
HCT: 38 % — ABNORMAL LOW (ref 39.0–52.0)
Hemoglobin: 12.4 g/dL — ABNORMAL LOW (ref 13.0–17.0)
MCH: 32.6 pg (ref 26.0–34.0)
MCHC: 32.6 g/dL (ref 30.0–36.0)
MCV: 100 fL (ref 80.0–100.0)
Platelets: 419 10*3/uL — ABNORMAL HIGH (ref 150–400)
RBC: 3.8 MIL/uL — ABNORMAL LOW (ref 4.22–5.81)
RDW: 14.6 % (ref 11.5–15.5)
WBC: 9.4 10*3/uL (ref 4.0–10.5)
nRBC: 0 % (ref 0.0–0.2)

## 2023-08-31 LAB — RESP PANEL BY RT-PCR (RSV, FLU A&B, COVID)  RVPGX2
Influenza A by PCR: NEGATIVE
Influenza B by PCR: NEGATIVE
Resp Syncytial Virus by PCR: NEGATIVE
SARS Coronavirus 2 by RT PCR: NEGATIVE

## 2023-08-31 LAB — BLOOD GAS, VENOUS

## 2023-08-31 LAB — MRSA NEXT GEN BY PCR, NASAL: MRSA by PCR Next Gen: NOT DETECTED

## 2023-08-31 LAB — LACTIC ACID, PLASMA
Lactic Acid, Venous: 5.7 mmol/L (ref 0.5–1.9)
Lactic Acid, Venous: 5.9 mmol/L (ref 0.5–1.9)
Lactic Acid, Venous: 4.6 mmol/L (ref 0.5–1.9)

## 2023-08-31 LAB — BRAIN NATRIURETIC PEPTIDE: B Natriuretic Peptide: 486.2 pg/mL — ABNORMAL HIGH (ref 0.0–100.0)

## 2023-08-31 LAB — GLUCOSE, CAPILLARY: Glucose-Capillary: 104 mg/dL — ABNORMAL HIGH (ref 70–99)

## 2023-08-31 MED ORDER — DIVALPROEX SODIUM ER 500 MG PO TB24
500.0000 mg | ORAL_TABLET | Freq: Every day | ORAL | Status: DC
  Administered 2023-09-01 – 2023-09-03 (×3): 500 mg via ORAL
  Filled 2023-08-31 (×4): qty 1

## 2023-08-31 MED ORDER — THIAMINE HCL 100 MG PO TABS
100.0000 mg | ORAL_TABLET | Freq: Every morning | ORAL | Status: DC
Start: 2023-09-01 — End: 2023-09-04
  Administered 2023-09-02 – 2023-09-03 (×2): 100 mg via ORAL
  Filled 2023-08-31 (×7): qty 1

## 2023-08-31 MED ORDER — DIVALPROEX SODIUM ER 500 MG PO TB24
500.0000 mg | ORAL_TABLET | Freq: Every day | ORAL | Status: DC
  Administered 2023-09-02 – 2023-09-03 (×2): 500 mg via ORAL
  Filled 2023-08-31 (×4): qty 1

## 2023-08-31 MED ORDER — FOLIC ACID 1 MG PO TABS
1.0000 mg | ORAL_TABLET | Freq: Every morning | ORAL | Status: DC
  Administered 2023-09-02 – 2023-09-03 (×2): 1 mg via ORAL
  Filled 2023-08-31 (×3): qty 1

## 2023-08-31 MED ORDER — ORAL CARE MOUTH RINSE: 15.0000 mL | OROMUCOSAL | Status: DC | PRN

## 2023-08-31 MED ORDER — SODIUM CHLORIDE 0.9 % IV SOLN
2.0000 g | Freq: Two times a day (BID) | INTRAVENOUS | Status: DC
  Administered 2023-08-31: 2 g via INTRAVENOUS
  Filled 2023-08-31 (×2): qty 12.5

## 2023-08-31 MED ORDER — SENNOSIDES-DOCUSATE SODIUM 8.6-50 MG PO TABS: 1.0000 | ORAL_TABLET | Freq: Every evening | ORAL | Status: DC | PRN

## 2023-08-31 MED ORDER — LACTATED RINGERS IV BOLUS
1000.0000 mL | Freq: Once | INTRAVENOUS | Status: AC
Start: 2023-08-31 — End: 2023-08-31
  Administered 2023-08-31: 1000 mL via INTRAVENOUS

## 2023-08-31 MED ORDER — ONDANSETRON HCL 4 MG/2ML IJ SOLN: 4.0000 mg | Freq: Four times a day (QID) | INTRAMUSCULAR | Status: AC | PRN

## 2023-08-31 MED ORDER — SODIUM CHLORIDE 0.9 % IV SOLN
500.0000 mg | INTRAVENOUS | Status: AC
  Administered 2023-09-01 – 2023-09-04 (×4): 500 mg via INTRAVENOUS
  Filled 2023-08-31 (×4): qty 5

## 2023-08-31 MED ORDER — SODIUM CHLORIDE 0.9 % IV SOLN
500.0000 mg | Freq: Once | INTRAVENOUS | Status: AC
  Administered 2023-08-31: 500 mg via INTRAVENOUS
  Filled 2023-08-31: qty 5

## 2023-08-31 MED ORDER — ACETAMINOPHEN 10 MG/ML IV SOLN
1000.0000 mg | Freq: Once | INTRAVENOUS | Status: AC
  Administered 2023-08-31: 1000 mg via INTRAVENOUS
  Filled 2023-08-31: qty 100

## 2023-08-31 MED ORDER — VANCOMYCIN HCL IN DEXTROSE 1-5 GM/200ML-% IV SOLN
1000.0000 mg | Freq: Once | INTRAVENOUS | Status: AC
  Administered 2023-08-31: 1000 mg via INTRAVENOUS
  Filled 2023-08-31: qty 200

## 2023-08-31 MED ORDER — VANCOMYCIN HCL 750 MG/150ML IV SOLN
750.0000 mg | Freq: Once | INTRAVENOUS | Status: AC
  Administered 2023-08-31: 750 mg via INTRAVENOUS
  Filled 2023-08-31: qty 150

## 2023-08-31 MED ORDER — ESCITALOPRAM OXALATE 10 MG PO TABS
5.0000 mg | ORAL_TABLET | Freq: Every day | ORAL | Status: DC
  Administered 2023-08-31 – 2023-09-03 (×4): 5 mg via ORAL
  Filled 2023-08-31 (×4): qty 0.5

## 2023-08-31 MED ORDER — SODIUM CHLORIDE 0.9 % IV SOLN: 2.0000 g | INTRAVENOUS | Status: DC

## 2023-08-31 MED ORDER — CHLORHEXIDINE GLUCONATE CLOTH 2 % EX PADS: 6.0000 | MEDICATED_PAD | Freq: Every day | CUTANEOUS | Status: DC

## 2023-08-31 MED ORDER — SODIUM CHLORIDE 0.9 % IV SOLN: INTRAVENOUS | Status: DC

## 2023-08-31 MED ORDER — ASPIRIN 81 MG PO TBEC
81.0000 mg | DELAYED_RELEASE_TABLET | Freq: Every morning | ORAL | Status: DC
  Administered 2023-09-02 – 2023-09-03 (×2): 81 mg via ORAL
  Filled 2023-08-31 (×3): qty 1

## 2023-08-31 MED ORDER — ACETAMINOPHEN 325 MG PO TABS
650.0000 mg | ORAL_TABLET | Freq: Four times a day (QID) | ORAL | Status: AC | PRN
  Administered 2023-09-01 – 2023-09-02 (×2): 650 mg via ORAL
  Filled 2023-08-31 (×2): qty 2

## 2023-08-31 MED ORDER — ADULT MULTIVITAMIN W/MINERALS CH
1.0000 | ORAL_TABLET | Freq: Every day | ORAL | Status: DC
  Administered 2023-09-02 – 2023-09-03 (×2): 1 via ORAL
  Filled 2023-08-31 (×3): qty 1

## 2023-08-31 MED ORDER — GABAPENTIN 100 MG PO CAPS
100.0000 mg | ORAL_CAPSULE | Freq: Three times a day (TID) | ORAL | Status: DC
  Administered 2023-09-01 – 2023-09-03 (×7): 100 mg via ORAL
  Filled 2023-08-31 (×9): qty 1

## 2023-08-31 MED ORDER — LACTATED RINGERS IV BOLUS
500.0000 mL | Freq: Once | INTRAVENOUS | Status: AC
  Administered 2023-08-31: 500 mL via INTRAVENOUS

## 2023-08-31 MED ORDER — LACTATED RINGERS IV BOLUS
1000.0000 mL | Freq: Once | INTRAVENOUS | Status: AC
  Administered 2023-08-31: 1000 mL via INTRAVENOUS

## 2023-08-31 MED ORDER — ACETAMINOPHEN 650 MG RE SUPP: 650.0000 mg | Freq: Four times a day (QID) | RECTAL | Status: AC | PRN

## 2023-08-31 MED ORDER — ONDANSETRON HCL 4 MG PO TABS: 4.0000 mg | ORAL_TABLET | Freq: Four times a day (QID) | ORAL | Status: AC | PRN

## 2023-08-31 MED ORDER — VITAMIN B-6 100 MG PO TABS
100.0000 mg | ORAL_TABLET | Freq: Every day | ORAL | Status: DC
  Administered 2023-09-01 – 2023-09-03 (×3): 100 mg via ORAL
  Filled 2023-08-31 (×6): qty 1

## 2023-08-31 MED ORDER — HYDRALAZINE HCL 20 MG/ML IJ SOLN: 5.0000 mg | Freq: Four times a day (QID) | INTRAMUSCULAR | Status: DC | PRN

## 2023-08-31 MED ORDER — HEPARIN SODIUM (PORCINE) 5000 UNIT/ML IJ SOLN
5000.0000 [IU] | Freq: Three times a day (TID) | INTRAMUSCULAR | Status: DC
  Administered 2023-08-31 – 2023-09-01 (×2): 5000 [IU] via SUBCUTANEOUS
  Filled 2023-08-31 (×2): qty 1

## 2023-08-31 MED ORDER — ENSURE ENLIVE PO LIQD
237.0000 mL | Freq: Three times a day (TID) | ORAL | Status: DC
  Administered 2023-09-02 – 2023-09-03 (×3): 237 mL via ORAL

## 2023-08-31 MED ORDER — VALPROATE SODIUM 100 MG/ML IV SOLN
500.0000 mg | Freq: Once | INTRAVENOUS | Status: AC
  Administered 2023-09-01: 500 mg via INTRAVENOUS
  Filled 2023-08-31: qty 5

## 2023-08-31 MED ORDER — IPRATROPIUM-ALBUTEROL 0.5-2.5 (3) MG/3ML IN SOLN
3.0000 mL | Freq: Once | RESPIRATORY_TRACT | Status: AC
Start: 2023-08-31 — End: 2023-08-31
  Administered 2023-08-31: 3 mL via RESPIRATORY_TRACT
  Filled 2023-08-31: qty 3

## 2023-08-31 MED ORDER — SODIUM CHLORIDE 0.9 % IV SOLN
2.0000 g | Freq: Once | INTRAVENOUS | Status: AC
  Administered 2023-08-31: 2 g via INTRAVENOUS
  Filled 2023-08-31: qty 12.5

## 2023-08-31 MED ORDER — QUETIAPINE FUMARATE 25 MG PO TABS
50.0000 mg | ORAL_TABLET | ORAL | Status: DC
Start: 2023-09-01 — End: 2023-09-04
  Administered 2023-09-02 – 2023-09-03 (×4): 50 mg via ORAL
  Filled 2023-08-31 (×6): qty 2

## 2023-08-31 MED ORDER — IPRATROPIUM-ALBUTEROL 0.5-2.5 (3) MG/3ML IN SOLN: 3.0000 mL | Freq: Four times a day (QID) | RESPIRATORY_TRACT | Status: AC | PRN

## 2023-08-31 MED ORDER — ROSUVASTATIN CALCIUM 10 MG PO TABS
20.0000 mg | ORAL_TABLET | Freq: Every day | ORAL | Status: DC
  Administered 2023-09-01 – 2023-09-03 (×3): 20 mg via ORAL
  Filled 2023-08-31 (×4): qty 2

## 2023-08-31 MED ORDER — VANCOMYCIN VARIABLE DOSE PER UNSTABLE RENAL FUNCTION (PHARMACIST DOSING): Status: DC

## 2023-08-31 MED ORDER — QUETIAPINE FUMARATE 100 MG PO TABS
150.0000 mg | ORAL_TABLET | Freq: Every day | ORAL | Status: DC
  Administered 2023-08-31 – 2023-09-01 (×2): 150 mg via ORAL
  Filled 2023-08-31 (×2): qty 2

## 2023-08-31 NOTE — ED Notes (Signed)
 1st set of cultures collected at this time.

## 2023-08-31 NOTE — ED Notes (Signed)
 Called lab at this time for collection of 2nd blood cultures.

## 2023-08-31 NOTE — Assessment & Plan Note (Addendum)
 Patient had fever of 101.4, increased respiration, increased heart rate, elevated lactic acid, organ involvement or pulmonary and renal.  Likely secondary to multifocal bilateral pneumonia.  Procalcitonin elevated at 15.77 and preliminary blood cultures negative in 24 hours.  MRSA PCR negative, improving lactic acidosis but still elevated at 2.9 -Ordered expanded RVP - Checking strep pneumo and Legionella antigen - Continue with Zithromax  and cefepime  - Discontinue vancomycin  - Monitor lactic acid - Continue with supportive care - Continue with IV fluid

## 2023-08-31 NOTE — Hospital Course (Addendum)
 Paul Vaughan is a 68 year old male with history of non-insulin-dependent diabetes mellitus, hyperlipidemia, neuropathy, intellectual learning difficulty/dementia, COPD, on baseline 4 L nasal cannula, heart failure preserved ejection fraction, who presents emergency department for chief concerns of respiratory distress noticed by nursing staff.  Vitals in the ED showed temperature of 101.4, respiration rate of 25, heart rate of 130, blood pressure 104/77, SpO2 of 95% on BiPAP.  Serum sodium is 136, potassium 5.0, chloride 100, bicarb 23, BUN 36, serum creatinine 1.39, EGFR 55, nonfasting blood glucose 126, WBC 9.4, hemoglobin 12.4, platelets of 419.BNP was elevated at 486.2.  High sensitive troponin was 14.  Lactic acid was 5.7 and on repeat 1 hour later was 5.9. COVID/influenza A/influenza B/RSV PCR were negative. Blood cultures x 2 are in process.  ED treatment: DuoNebs one-time treatment, acetaminophen  1000 mg IV one-time dose, azithromycin  500 mg IV one-time dose, ceftriaxone  2 g IV one-time dose, LR 2.5 mL liter bolus were given, and vancomycin  per pharmacy.  Patient was recently admitted from 3/16 till 08/19/2023 when presented with generalized weakness, treated for pneumonia and rhabdo.  Patient did had worsening respiratory status during that admission which was stabilizes and he was discharged on 4 L of oxygen to SNF.  4/20: Afebrile with mild tachycardia, remained on BiPAP.  Patient recently treated for multifocal pneumonia.  Also noted significantly elevated CK meeting criteria for rhabdomyolysis with no history of fall.  CK improved with IV fluid, seems like having chronic intermittently elevated CK. MRSA PCR negative so discontinuing vancomycin .  CTA chest was done due to the significant hypoxia, had a small PE and right upper lobe-started on heparin  infusion.  Did show multifocal pneumonia.  Procalcitonin significantly elevated at 15.77.  Ordered expanded respiratory panel, strep  pneumo and Legionella antigen.  4/21: Vital stable but remained on BiPAP.  RVP negative, strep pneumo negative, lactic acidosis resolved.  Labs seems stable.  Patient becoming tachypneic and requiring up to 15 L of oxygen when trying to wean off from BiPAP.  PCCM was also consulted.  Echocardiogram done-pending results

## 2023-08-31 NOTE — ED Triage Notes (Signed)
 Pt in via [facility] for respiratory distress. Pt from [facility]. Staff noticed pt was SOB this monring. pt is A&O. On [place] 4L at baseline. pt on Cpap with EMS. Pt recently d/c from hospital. Per EMS pt had pursed lip breathing and tripoding at facilty but denies feeling SOB.  x2 albuterol  treatments and 125mg  of solumedrol at facility x1 duoneb with EMS

## 2023-08-31 NOTE — Assessment & Plan Note (Signed)
 Home quetiapine  150 mg nightly, quetiapine  50 mg in the morning and in the afternoon were resumed on admission

## 2023-08-31 NOTE — Assessment & Plan Note (Addendum)
 Please see above

## 2023-08-31 NOTE — ED Notes (Signed)
 Pt stuck multiple times to obtain blood cultures. Unsuccessful at any attempts at this time.

## 2023-08-31 NOTE — Sepsis Progress Note (Addendum)
 Elink following code sepsis  1252 1st lactic 5.7, 1 L LR ordered and given, messaged MD to make aware of recommended fluid volume based on weight is . Pt as hx of CHF, asked MD if he plans to give any additional fluids.>> MD responded- he has ordered an additional 1,541ml of fluids to be given.

## 2023-08-31 NOTE — Assessment & Plan Note (Signed)
 Home escitalopram  5 mg nightly resumed

## 2023-08-31 NOTE — Assessment & Plan Note (Signed)
 Recheck LFTs in the a.m. Etiology likely secondary to severe sepsis with organ involvement

## 2023-08-31 NOTE — Assessment & Plan Note (Signed)
 In setting of severe sepsis with organ involvement Avoid nephrotoxic agent Continue fluid hydration, IV antibiotic treatment per above Recheck BMP in a.m.

## 2023-08-31 NOTE — Consult Note (Signed)
 Pharmacy Antibiotic Note  Paul Vaughan is a 68 y.o. male admitted on 08/31/2023 with sepsis and multifocal pneumonia. Pharmacy has been consulted for vancomycin  dosing.  Today, 08/31/2023: Day 1 of vancomycin   WBC 9.4  Tmax 101.4  SCr 1.39 with estimated CrCl of 49.2 mL/min  Baseline renal function ~ 0.8  Received one time antibiotic doses in the ED Azithromycin  500 mg, Cefepime  2 gm, Vancomycin  1000 mg   Plan: Will give an additional vancomycin  750 mg IV to complete a full loading dose of 1750 mg  Given unstable renal function, will check a vancomycin  random level in 24 hours to guide further dosing  F/u MRSA nares for possible de-escalation  Patient also receiving cefepime  2 gm IV Q12H and azithromycin  500 mg IV Q24H  Pharmacy will continue to monitor and dose adjust appropriately   Height: 5\' 8"  (172.7 cm) Weight: 77 kg (169 lb 12.1 oz) IBW/kg (Calculated) : 68.4  Temp (24hrs), Avg:101.4 F (38.6 C), Min:101.4 F (38.6 C), Max:101.4 F (38.6 C)  Recent Labs  Lab 08/31/23 1128 08/31/23 1230  WBC 9.4  --   CREATININE 1.39*  --   LATICACIDVEN 5.7* 5.9*    Estimated Creatinine Clearance: 49.2 mL/min (A) (by C-G formula based on SCr of 1.39 mg/dL (H)).    Allergies  Allergen Reactions   Penicillins Rash and Swelling    .Has patient had a PCN reaction causing immediate rash, facial/tongue/throat swelling, SOB or lightheadedness with hypotension: Unknown Has patient had a PCN reaction causing severe rash involving mucus membranes or skin necrosis: Unknown Has patient had a PCN reaction that required hospitalization: Unknown Has patient had a PCN reaction occurring within the last 10 years: Unknown If all of the above answers are "NO", then may proceed with Cephalosporin use.    Sulfa Antibiotics Rash and Swelling   Antimicrobials this admission: 4/19 cefepime  >>  4/19 azithromycin  >> 4/19 vancomycin  >>   Dose adjustments this admission:  Microbiology  results: 4/19 BCx: sent 4/19 Resp panel: negative  4/19 MRSA nares: needs to be collected   Thank you for allowing pharmacy to be a part of this patient's care.  Perlie Stene, PharmD Pharmacy Resident  08/31/2023 2:28 PM

## 2023-08-31 NOTE — ED Notes (Signed)
 Advised nurse that patient has ready bed

## 2023-08-31 NOTE — Consult Note (Signed)
 CODE SEPSIS - PHARMACY COMMUNICATION  **Broad Spectrum Antibiotics should be administered within 1 hour of Sepsis diagnosis**  Time Code Sepsis Called/Page Received: 1140  Antibiotics Ordered: Vancomycin , Cefepime , Azithromycin    Time of 1st antibiotic administration: 1155  Additional action taken by pharmacy: N/A  If necessary, Name of Provider/Nurse Contacted: N/A  Pansy Bogus, PharmD Pharmacy Resident  08/31/2023 11:54 AM

## 2023-08-31 NOTE — Progress Notes (Signed)
 Attempted to get report to receive pt at 1716. Per ED RN, still waiting on lab results for VBG. Lactic 4.6

## 2023-08-31 NOTE — Assessment & Plan Note (Signed)
 Suspect 2/2 to multilobar pna, however with patient history of HFpEF and learning disability, we will check two HS troponin on admission Continuous pulse oximetry

## 2023-08-31 NOTE — Progress Notes (Signed)
   CROSS COVER NOTE  Patient name: Paul Vaughan MRN: [id number] DOB : Mar 07, 1956  Concern as stated by nurse / staff   Good evening doc. This 68 year old male with history of non-insulin-dependent diabetes mellitus, hyperlipidemia, neuropathy, intellectual learning difficulty/dementia, COPD, on baseline 4 L nasal cannula, heart failure preserved ejection fraction. Came in today due to shortnesss of breath/difficulty breathing. Was on BIPAP in the ED and now on 10 L HFNC (no order for non invasive ventilation). Currently patient is Alert and Oriented x 2. I was giving him his night time meds he did tolerated taking some of his meds but was complaining of Difficulty swallowing and would not want to take the rest of his medication. Looking back on his last admission, he was on dysphagia diet but was ordered heart healthy on this admission. Can I put him NPO for now and put in an order of swallowing evaluation?    Review of Pertinent findings: Medications not given: depakote , gabapentin , Vit B6 and crestor    Assessment and  Interventions   Assessment:remains on HFNC at 10L and otherwise stable vital signs. Has been on high dose of m IVF (150mL/hr) for several hours.  Plan: Made NPO pending swallow evaluation.  Encouraging depakote  administration with others ok to wait. Changed to IV for this evenings dose.  Slowing IV fluids as he is hemodynamically stable and per nursing report has (wet to auscultation) lung sounds. Repeat LA is pending. Most recently was 4.6

## 2023-08-31 NOTE — Assessment & Plan Note (Signed)
 Hydralazine 5 mg IV every 6 hours as needed for SBP > 175, 5 days ordered

## 2023-08-31 NOTE — ED Notes (Signed)
 Pt brief wet. Pt changed at this time. New brief placed. New chux pad placed.

## 2023-08-31 NOTE — ED Notes (Signed)
 Lab at bedside for 2nd set of cultures.

## 2023-08-31 NOTE — Assessment & Plan Note (Signed)
 Home rosuvastatin  20 mg nightly resumed on admission Home Praluent not resumed on admission

## 2023-08-31 NOTE — ED Provider Notes (Signed)
 [facility] Provider Note    Event Date/Time   First MD Initiated Contact with Patient 08/31/23 1134     (approximate)   History   Chief Complaint Shortness of Breath   HPI  Paul Vaughan is a 68 y.o. male with past medical history of intellectual disability/dementia, CKD, COPD, and HFpEF who presents to the ED complaining of shortness of breath.  Per EMS, staff at [facility] noted that patient was having difficulty breathing after waking up this morning.  He was given a couple of albuterol  treatments along with Solu-Medrol  but symptoms seem to worsen throughout the day and eventually EMS was called.  EMS reports that patient initially had an oxygen saturation in the 70s on his typical 2 L nasal cannula.  He was subsequently placed on CPAP with improvement, denies feeling short of breath on arrival to the ED but has obviously increased work of breathing.  Patient denies any chest pain or any other complaints.     Physical Exam   Triage Vital Signs: ED Triage Vitals  Encounter Vitals Group     BP 08/31/23 1131 104/77     Systolic BP Percentile --      Diastolic BP Percentile --      Pulse Rate 08/31/23 1130 (!) 130     Resp 08/31/23 1130 (!) 25     Temp 08/31/23 1132 (!) 101.4 F (38.6 C)     Temp Source 08/31/23 1132 Rectal     SpO2 08/31/23 1130 95 %     Weight 08/31/23 1131 169 lb 12.1 oz (77 kg)     Height 08/31/23 1131 5\' 8"  (1.727 m)     Head Circumference --      [facility] Flow --      Pain Score --      Pain Loc --      Pain Education --      Exclude from Growth Chart --     Most recent vital signs: Vitals:   08/31/23 1131 08/31/23 1132  BP: 104/77   Pulse:    Resp:    Temp:  (!) 101.4 F (38.6 C)  SpO2:      Constitutional: Awake and alert. Eyes: Conjunctivae are normal. Head: Atraumatic. Nose: No congestion/rhinnorhea. Mouth/Throat: Mucous membranes are moist.  Cardiovascular: Tachycardic, regular rhythm. Grossly normal  heart sounds.  2+ radial pulses bilaterally. Respiratory: Tachypneic with increased work of breathing and faint expiratory wheezing noted. Gastrointestinal: Soft and nontender. No distention. Musculoskeletal: No lower extremity tenderness nor edema.  Neurologic:  Normal speech and language. No gross focal neurologic deficits are appreciated.    ED Results / Procedures / Treatments   Labs (all labs ordered are listed, but only abnormal results are displayed) Labs Reviewed  BLOOD GAS, VENOUS - Abnormal; Notable for the following components:      Result Value   pO2, Ven <31 (*)    All other components within normal limits  CBC - Abnormal; Notable for the following components:   RBC 3.80 (*)    Hemoglobin 12.4 (*)    HCT 38.0 (*)    Platelets 419 (*)    All other components within normal limits  COMPREHENSIVE METABOLIC PANEL WITH GFR - Abnormal; Notable for the following components:   Glucose, Bld 126 (*)    BUN 36 (*)    Creatinine, Ser 1.39 (*)    Albumin  2.9 (*)    AST 46 (*)    GFR, Estimated 55 (*)  All other components within normal limits  BRAIN NATRIURETIC PEPTIDE - Abnormal; Notable for the following components:   B Natriuretic Peptide 486.2 (*)    All other components within normal limits  LACTIC ACID, PLASMA - Abnormal; Notable for the following components:   Lactic Acid, Venous 5.7 (*)    All other components within normal limits  LACTIC ACID, PLASMA - Abnormal; Notable for the following components:   Lactic Acid, Venous 5.9 (*)    All other components within normal limits  RESP PANEL BY RT-PCR (RSV, FLU A&B, COVID)  [id number]  CULTURE, BLOOD (ROUTINE X 2)  CULTURE, BLOOD (ROUTINE X 2)  URINALYSIS, ROUTINE W REFLEX MICROSCOPIC  LACTIC ACID, PLASMA  TROPONIN I (HIGH SENSITIVITY)  TROPONIN I (HIGH SENSITIVITY)     EKG  ED ECG REPORT I, Twilla Galea, the attending physician, personally viewed and interpreted this ECG.   Date: 08/31/2023  EKG Time: 11:29   Rate: 127  Rhythm: sinus tachycardia  Axis: LAD  Intervals:none  ST&T Change: None  RADIOLOGY Chest x-ray reviewed and interpreted by me, concerning for multifocal pneumonia.  PROCEDURES:  Critical Care performed: Yes, see critical care procedure note(s)  .Critical Care  Performed by: Twilla Galea, MD Authorized by: Twilla Galea, MD   Critical care provider statement:    Critical care time (minutes):  30   Critical care time was exclusive of:  Separately billable procedures and treating other patients and teaching time   Critical care was necessary to treat or prevent imminent or life-threatening deterioration of the following conditions:  Respiratory failure   Critical care was time spent personally by me on the following activities:  Development of treatment plan with patient or surrogate, discussions with consultants, evaluation of patient's response to treatment, examination of patient, ordering and review of laboratory studies, ordering and review of radiographic studies, ordering and performing treatments and interventions, pulse oximetry, re-evaluation of patient's condition and review of old charts   I assumed direction of critical care for this patient from another provider in my specialty: no     Care discussed with: admitting provider      MEDICATIONS ORDERED IN ED: Medications  acetaminophen  (OFIRMEV ) IV 1,000 mg (0 mg Intravenous Stopped 08/31/23 1303)  vancomycin  (VANCOCIN ) IVPB 1000 mg/200 mL premix (1,000 mg Intravenous New Bag/Given 08/31/23 1310)  ceFEPIme  (MAXIPIME ) 2 g in sodium chloride  0.9 % 100 mL IVPB (0 g Intravenous Stopped 08/31/23 1248)  azithromycin  (ZITHROMAX ) 500 mg in sodium chloride  0.9 % 250 mL IVPB (0 mg Intravenous Stopped 08/31/23 1311)  lactated ringers  bolus 1,000 mL (0 mLs Intravenous Stopped 08/31/23 1401)  ipratropium-albuterol  (DUONEB) 0.5-2.5 (3) MG/3ML nebulizer solution 3 mL (3 mLs Nebulization Given 08/31/23 1248)  lactated ringers   bolus 1,000 mL (0 mLs Intravenous Stopped 08/31/23 1401)  lactated ringers  bolus 500 mL (500 mLs Intravenous New Bag/Given 08/31/23 1401)     IMPRESSION / MDM / ASSESSMENT AND PLAN / ED COURSE  I reviewed the triage vital signs and the nursing notes.                              68 y.o. male with past medical history of intellectual disability/dementia, CKD, COPD, and CHF who presents to the ED with increasing difficulty breathing since this morning.  Patient's presentation is most consistent with acute presentation with potential threat to life or bodily function.  Differential diagnosis includes, but is not limited to, COPD exacerbation, CHF exacerbation,  respiratory failure, sepsis, pneumonia, arrhythmia, ACS, PE, anemia, electrolyte abnormality, AKI.  Patient ill-appearing and in respiratory distress on arrival with tachypnea and accessory muscle use.  He quickly drops oxygen saturation into the 70s off of supplemental oxygen, was placed on BiPAP with improvement in saturations as well as work of breathing.  We will treat ongoing wheezing with DuoNeb, patient also noted to be febrile and tachycardic.  Sepsis workup initiated and we will start on broad-spectrum antibiotics along with IV fluids.  VBG is reassuring, chest x-ray and labs are pending at this time.  Chest x-ray concerning for multifocal pneumonia by my read.  Labs without significant anemia or leukocytosis, patient with AKI but no acute electrolyte abnormality.  Troponin within normal limits, BNP mildly elevated but patient does not appear clinically fluid overloaded.  Lactic acid elevated and slightly uptrending on recheck, will give remainder of 30 cc/kg IV fluid bolus and continue to trend lactic acid.  He has received broad-spectrum antibiotics for pneumonia and case discussed with hospitalist for admission.      FINAL CLINICAL IMPRESSION(S) / ED DIAGNOSES   Final diagnoses:  Sepsis with acute hypoxic respiratory failure  without septic shock, due to unspecified organism [facility])  Multifocal pneumonia     Rx / DC Orders   ED Discharge Orders     None        Note:  This document was prepared using Dragon voice recognition software and may include unintentional dictation errors.   Twilla Galea, MD 08/31/23 1420

## 2023-08-31 NOTE — H&P (Addendum)
 History and Physical   Paul Vaughan:[id number] DOB: Jan 10, 1956 DOA: 08/31/2023  PCP: Meri Stammer, NP  Outpatient Specialists: Dr. Bob Burn, cardiology Patient coming from: Assisted living via EMS  I have personally briefly reviewed patient's old medical records in [facility] EMR.  Chief Concern: respiratory distress  HPI: [name] is a 68 year old male with history of non-insulin-dependent diabetes mellitus, hyperlipidemia, neuropathy, intellectual learning difficulty/dementia, COPD, on baseline 4 L nasal cannula, heart failure preserved ejection fraction, who presents emergency department for chief concerns of respiratory distress noticed by nursing staff.  Vitals in the ED showed temperature of 101.4, respiration rate of 25, heart rate of 130, blood pressure 104/77, SpO2 of 95% on BiPAP.  Serum sodium is 136, potassium 5.0, chloride 100, bicarb 23, BUN 36, serum creatinine 1.39, EGFR 55, nonfasting blood glucose 126, WBC 9.4, hemoglobin 12.4, platelets of 419.  BNP was elevated at 486.2.  High sensitive troponin was 14.  Lactic acid was 5.7 and on repeat 1 hour later was 5.9.  COVID/influenza A/influenza B/RSV PCR were negative.  Blood cultures x 2 are in process.  ED treatment: DuoNebs one-time treatment, acetaminophen  1000 mg IV one-time dose, azithromycin  500 mg IV one-time dose, ceftriaxone  2 g IV one-time dose, LR 2.5 mL liter bolus were given, and vancomycin  per pharmacy. -------------------------------------- At bedside, patient able to tell me his first and last name, his current location of hospital.  He is not able to tell me his age accurately or the current calendar year.  He states initially that he was 68 years old and then 68 years old.  He reports he does not know his family.  He denies any recent cough, chest pain, abdominal pain, dysuria, hematuria, diarrhea.  He had no complaints.  He denies shortness of breath.  He does not know why he  is in the hospital.  Social history: Patient is currently at a assisted living facility, nursing facility.  He denies tobacco, EtOH, recreational drug use.  ROS: Unable to complete reliably as patient has baseline learning disability/intellectual disability and dementia  ED Course: Discussed with EDP, patient requiring hospitalization for chief concerns of severe sepsis.  Assessment/Plan  Principal Problem:   Severe sepsis with acute organ dysfunction (HCC) Active Problems:   AKI (acute kidney injury) (HCC)   Elevated LFTs   Multifocal pneumonia   Borderline intellectual disability   Adjustment disorder with mixed disturbance of emotions and conduct   Chronic depression   Essential (primary) hypertension   Hyperlipidemia, unspecified   Chronic diastolic CHF (congestive heart failure) (HCC)   Acute on chronic hypoxic respiratory failure (HCC)   Assessment and Plan:  * Severe sepsis with acute organ dysfunction (HCC) Patient had fever of 101.4, increased respiration, increased heart rate, elevated lactic acid, organ involvement or pulmonary and renal Blood cultures x 2 are in process Continue with azithromycin  500 mg IV daily, to complete a 5-day course; cefepime  and vancomycin  per pharmacy Continuous pulse oximetry Patient is currently requiring BiPAP therapy DuoNebs every 6 hours as needed for shortness of breath and wheezing, 1 day ordered Status post sepsis bolus on admission by EDP Continue with aggressive fluid hydration at sodium chloride  150 mL/h, 1 day ordered Maintain MAP > 65  Addendum (1745): 3rd lactic acid is downtrending to 4.6 from 5.9. Continue abx per above and aggressive fluid hydration.   Multifocal pneumonia Antibiotic:,  azithromycin  cefepime , vancomycin  Continuous pulse oximetry  Elevated LFTs Recheck LFTs in the a.m. Etiology likely secondary to severe sepsis  with organ involvement  AKI (acute kidney injury) (HCC) In setting of severe sepsis with  organ involvement Avoid nephrotoxic agent Continue fluid hydration, IV antibiotic treatment per above Recheck BMP in a.m.  Acute on chronic hypoxic respiratory failure (HCC) Suspect 2/2 to multilobar pna, however with patient history of HFpEF and learning disability, we will check two HS troponin on admission Continuous pulse oximetry  Hyperlipidemia, unspecified Home rosuvastatin  20 mg nightly resumed on admission Home Praluent not resumed on admission  Essential (primary) hypertension Hydralazine  5 mg IV every 6 hours as needed for SBP > 175, 5 days ordered  Chronic depression Home escitalopram  5 mg nightly resumed  Adjustment disorder with mixed disturbance of emotions and conduct Home quetiapine  150 mg nightly, quetiapine  50 mg in the morning and in the afternoon were resumed on admission  Chart reviewed.   DVT prophylaxis: Heparin  5000 units subcutaneous every 8 hours Code Status: Full code Diet: N.p.o. as patient is currently on BiPAP therapy Family Communication: No Disposition Plan: Pending clinical course Consults called: Pharmacy Admission status: Stepdown, inpatient  Past Medical History:  Diagnosis Date   CHF (congestive heart failure) (HCC)    Chronic kidney disease    COPD (chronic obstructive pulmonary disease) (HCC)    Dementia (HCC)    History reviewed. No pertinent surgical history.  Social History:  reports that he has been smoking cigarettes. He has never used smokeless tobacco. He reports that he does not drink alcohol and does not use drugs.  Allergies  Allergen Reactions   Penicillins Rash and Swelling    .Has patient had a PCN reaction causing immediate rash, facial/tongue/throat swelling, SOB or lightheadedness with hypotension: Unknown Has patient had a PCN reaction causing severe rash involving mucus membranes or skin necrosis: Unknown Has patient had a PCN reaction that required hospitalization: Unknown Has patient had a PCN reaction  occurring within the last 10 years: Unknown If all of the above answers are "NO", then may proceed with Cephalosporin use.    Sulfa Antibiotics Rash and Swelling   Family History  Family history unknown: Yes   Family history: Family history reviewed and not pertinent  Prior to Admission medications   Medication Sig Start Date End Date Taking? Authorizing Provider  albuterol  (PROVENTIL ) (2.5 MG/3ML) 0.083% nebulizer solution Take 2.5 mg by nebulization QID.   Yes [provider]  albuterol  (VENTOLIN  HFA) 108 (90 Base) MCG/ACT inhaler Inhale 2 puffs into the lungs every 6 (six) hours as needed for wheezing or shortness of breath.   Yes [provider]  aspirin  EC 81 MG tablet Take 81 mg by mouth in the morning. Swallow whole.   Yes [provider]  divalproex  (DEPAKOTE  ER) 500 MG 24 hr tablet 1 tablet (500 mg) every morning and 2 tablets (1000 mg) at bedtime 08/19/23  Yes Alexander, Natalie, DO  escitalopram  (LEXAPRO ) 5 MG tablet Take 5 mg by mouth at bedtime. 06/05/21  Yes [provider]  ferrous sulfate ER (IRON SLOW RELEASE) 142 (45 Fe) MG TBCR tablet Take 45 mg by mouth in the morning.   Yes [provider]  folic acid  (FOLVITE ) 1 MG tablet Take 1 mg by mouth in the morning.   Yes [provider]  JARDIANCE  10 MG TABS tablet Take 10 mg by mouth in the morning.   Yes [provider]  naloxone (NARCAN) nasal spray 4 mg/0.1 mL Place 1 spray into the nose once.   Yes [provider]  polyethylene glycol (MIRALAX  /  GLYCOLAX ) 17 g packet Take 17 g by mouth daily as needed for mild constipation. 08/19/23  Yes Alexander, Natalie, DO  feeding supplement (ENSURE ENLIVE / ENSURE PLUS) LIQD Take 237 mLs by mouth 3 (three) times daily between meals. 08/19/23   Alexander, Natalie, DO  gabapentin  (NEURONTIN ) 100 MG capsule Take 100 mg by mouth 3 (three) times daily. 06/05/21   [provider]  Multiple Vitamin (MULTIVITAMIN WITH  MINERALS) TABS tablet Take 1 tablet by mouth daily. 08/19/23   Alexander, Natalie, DO  ondansetron  (ZOFRAN ) 4 MG tablet Take 1 tablet (4 mg total) by mouth every 6 (six) hours as needed for nausea. 08/19/23   Alexander, Natalie, DO  PRALUENT 150 MG/ML SOAJ Inject 150 mg into the skin as directed. Every 2 weeks in the morning    [provider]  pyridOXINE  (VITAMIN B-6) 100 MG tablet Take 100 mg by mouth at bedtime.    [provider]  QUEtiapine  (SEROQUEL ) 100 MG tablet Take 150 mg by mouth at bedtime. 06/05/21   [provider]  QUEtiapine  (SEROQUEL ) 50 MG tablet Take 50 mg by mouth 2 (two) times daily. 50 mg in the morning (2) 50 mg at 2 pm 06/05/21   [provider]  rosuvastatin  (CRESTOR ) 20 MG tablet Take 20 mg by mouth at bedtime. 06/05/21   [provider]  thiamine  100 MG tablet Take 100 mg by mouth in the morning.    [provider]   Physical Exam: Vitals:   08/31/23 1504 08/31/23 1510 08/31/23 1515 08/31/23 1735  BP:  106/67 106/67 (!) 146/87  Pulse: (!) 104 (!) 103 (!) 109 (!) 108  Resp: 20 17  (!) 30  Temp:   98.1 F (36.7 C) (!) 97.3 F (36.3 C)  TempSrc:   Oral Axillary  SpO2: 90%  91% 96%  Weight:    74.6 kg  Height:       Constitutional: appears age-appropriate, frail Eyes: PERRL, lids and conjunctivae normal ENMT: Mucous membranes are moist. Posterior pharynx clear of any exudate or lesions. Age-appropriate dentition. Hearing appropriate Neck: normal, supple, no masses, no thyromegaly Respiratory: clear to auscultation bilaterally, no wheezing, no crackles. Normal respiratory effort. No accessory muscle use.  Cardiovascular: Regular rate and rhythm, no murmurs / rubs / gallops. No extremity edema. 2+ pedal pulses. No carotid bruits.  Abdomen: no tenderness, no masses palpated, no hepatosplenomegaly. Bowel sounds positive.  Musculoskeletal: no clubbing / cyanosis. No joint deformity upper and lower extremities. Good ROM,  no contractures, no atrophy. Normal muscle tone.  Skin: no rashes, lesions, ulcers. No induration Neurologic: Sensation intact. Strength 5/5 in all 4.  Psychiatric: Lacks judgment and insight. Alert and oriented x self and location. Normal mood.   EKG: independently reviewed, showing sinus tachycardia with rate of 127, QTc 458  Chest x-ray on Admission: I personally reviewed and I agree with radiologist reading as below.  DG Chest Port 1 View Result Date: 08/31/2023 CLINICAL DATA:  Shortness of breath EXAM: PORTABLE CHEST 1 VIEW COMPARISON:  08/15/2023, 08/03/2023, 06/30/2021 FINDINGS: Improved aeration at the right apex but new left perihilar and right basilar opacities compared to prior. Stable cardiomediastinal silhouette. No pneumothorax. IMPRESSION: Improved aeration at the right apex but new left perihilar and right basilar opacities compared to prior, suspicious for bilateral pneumonia. Electronically Signed   By: Esmeralda Hedge M.D.   On: 08/31/2023 15:31   Labs on Admission: I have personally reviewed following labs  CBC: Recent Labs  Lab 08/31/23 1128  WBC 9.4  HGB 12.4*  HCT 38.0*  MCV 100.0  PLT 419*   Basic Metabolic Panel: Recent Labs  Lab 08/31/23 1128  NA 136  K 5.0  CL 100  CO2 23  GLUCOSE 126*  BUN 36*  CREATININE 1.39*  CALCIUM  9.5   GFR: Estimated Creatinine Clearance: 49.2 mL/min (A) (by C-G formula based on SCr of 1.39 mg/dL (H)).  Liver Function Tests: Recent Labs  Lab 08/31/23 1128  AST 46*  ALT 22  ALKPHOS 49  BILITOT 0.7  PROT 8.0  ALBUMIN  2.9*   Urine analysis:    Component Value Date/Time   COLORURINE YELLOW (A) 08/14/2023 0947   APPEARANCEUR CLEAR (A) 08/14/2023 0947   LABSPEC 1.017 08/14/2023 0947   PHURINE 7.0 08/14/2023 0947   GLUCOSEU NEGATIVE 08/14/2023 0947   HGBUR NEGATIVE 08/14/2023 0947   BILIRUBINUR NEGATIVE 08/14/2023 0947   KETONESUR NEGATIVE 08/14/2023 0947   PROTEINUR NEGATIVE 08/14/2023 0947   NITRITE NEGATIVE  08/14/2023 0947   LEUKOCYTESUR NEGATIVE 08/14/2023 0947   CRITICAL CARE  Performed by: Dr. Reinhold Carbine  Total critical care time: 32 minutes  Critical care time was exclusive of separately billable procedures and treating other patients.  Critical care was necessary to treat or prevent imminent or life-threatening deterioration.  Critical care was time spent personally by me on the following activities: development of treatment plan with patient, as well as nursing, discussions with consultants, evaluation of patient's response to treatment, examination of patient, obtaining history from patient or surrogate, ordering and performing treatments and interventions, ordering and review of laboratory studies, ordering and review of radiographic studies, pulse oximetry and re-evaluation of patient's condition.  This document was prepared using Dragon Voice Recognition software and may include unintentional dictation errors.  Dr. Reinhold Carbine [facility]  If 7PM-7AM, please contact overnight-coverage provider If 7AM-7PM, please contact day attending provider [email or web address]  08/31/2023, 5:45 PM

## 2023-09-01 ENCOUNTER — Inpatient Hospital Stay

## 2023-09-01 ENCOUNTER — Inpatient Hospital Stay: Admit: 2023-09-01

## 2023-09-01 DIAGNOSIS — J189 Pneumonia, unspecified organism: Secondary | ICD-10-CM | POA: Diagnosis not present

## 2023-09-01 DIAGNOSIS — I2699 Other pulmonary embolism without acute cor pulmonale: Secondary | ICD-10-CM

## 2023-09-01 DIAGNOSIS — J9621 Acute and chronic respiratory failure with hypoxia: Secondary | ICD-10-CM | POA: Diagnosis not present

## 2023-09-01 DIAGNOSIS — N179 Acute kidney failure, unspecified: Secondary | ICD-10-CM

## 2023-09-01 DIAGNOSIS — F32A Depression, unspecified: Secondary | ICD-10-CM

## 2023-09-01 DIAGNOSIS — F4325 Adjustment disorder with mixed disturbance of emotions and conduct: Secondary | ICD-10-CM

## 2023-09-01 DIAGNOSIS — A419 Sepsis, unspecified organism: Secondary | ICD-10-CM | POA: Diagnosis not present

## 2023-09-01 DIAGNOSIS — I5032 Chronic diastolic (congestive) heart failure: Secondary | ICD-10-CM

## 2023-09-01 DIAGNOSIS — R7989 Other specified abnormal findings of blood chemistry: Secondary | ICD-10-CM

## 2023-09-01 DIAGNOSIS — E785 Hyperlipidemia, unspecified: Secondary | ICD-10-CM

## 2023-09-01 DIAGNOSIS — I10 Essential (primary) hypertension: Secondary | ICD-10-CM

## 2023-09-01 LAB — RESPIRATORY PANEL BY PCR

## 2023-09-01 LAB — PROTIME-INR
INR: 1.3 — ABNORMAL HIGH (ref 0.8–1.2)
Prothrombin Time: 16 s — ABNORMAL HIGH (ref 11.4–15.2)

## 2023-09-01 LAB — CBC
HCT: 31.6 % — ABNORMAL LOW (ref 39.0–52.0)
Hemoglobin: 10.2 g/dL — ABNORMAL LOW (ref 13.0–17.0)
MCH: 32.1 pg (ref 26.0–34.0)
MCHC: 32.3 g/dL (ref 30.0–36.0)
MCV: 99.4 fL (ref 80.0–100.0)
Platelets: 286 10*3/uL (ref 150–400)
RBC: 3.18 MIL/uL — ABNORMAL LOW (ref 4.22–5.81)
RDW: 14.8 % (ref 11.5–15.5)
WBC: 10.1 10*3/uL (ref 4.0–10.5)
nRBC: 0 % (ref 0.0–0.2)

## 2023-09-01 LAB — HIV ANTIBODY (ROUTINE TESTING W REFLEX): HIV Screen 4th Generation wRfx: NONREACTIVE

## 2023-09-01 LAB — BASIC METABOLIC PANEL WITH GFR
Anion gap: 12 (ref 5–15)
BUN: 37 mg/dL — ABNORMAL HIGH (ref 8–23)
CO2: 24 mmol/L (ref 22–32)
Calcium: 9.2 mg/dL (ref 8.9–10.3)
Chloride: 100 mmol/L (ref 98–111)
Creatinine, Ser: 0.97 mg/dL (ref 0.61–1.24)
GFR, Estimated: >60 mL/min (ref >60)
Glucose, Bld: 122 mg/dL — ABNORMAL HIGH (ref 70–99)
Potassium: 5 mmol/L (ref 3.5–5.1)
Sodium: 136 mmol/L (ref 135–145)

## 2023-09-01 LAB — HEPATIC FUNCTION PANEL
ALT: 16 U/L (ref 0–44)
AST: 40 U/L (ref 15–41)
Albumin: 2.6 g/dL — ABNORMAL LOW (ref 3.5–5.0)
Alkaline Phosphatase: 33 U/L — ABNORMAL LOW (ref 38–126)
Bilirubin, Direct: 0.1 mg/dL (ref 0.0–0.2)
Indirect Bilirubin: 0.6 mg/dL (ref 0.3–0.9)
Total Bilirubin: 0.7 mg/dL (ref 0.0–1.2)
Total Protein: 7.3 g/dL (ref 6.5–8.1)

## 2023-09-01 LAB — CK: Total CK: 72 U/L (ref 49–397)

## 2023-09-01 LAB — CORTISOL-AM, BLOOD: Cortisol - AM: 10.1 ug/dL (ref 6.7–22.6)

## 2023-09-01 LAB — APTT: aPTT: 33 s (ref 24–36)

## 2023-09-01 LAB — HEPARIN LEVEL (UNFRACTIONATED): Heparin Unfractionated: 0.16 [IU]/mL — ABNORMAL LOW (ref 0.30–0.70)

## 2023-09-01 LAB — STREP PNEUMONIAE URINARY ANTIGEN: Strep Pneumo Urinary Antigen: NEGATIVE

## 2023-09-01 LAB — PROCALCITONIN: Procalcitonin: 15.77 ng/mL

## 2023-09-01 LAB — LACTIC ACID, PLASMA: Lactic Acid, Venous: 2.9 mmol/L (ref 0.5–1.9)

## 2023-09-01 MED ORDER — LACTATED RINGERS IV SOLN: INTRAVENOUS | Status: AC

## 2023-09-01 MED ORDER — HEPARIN (PORCINE) 25000 UT/250ML-% IV SOLN
1400.0000 [IU]/h | INTRAVENOUS | Status: DC
  Administered 2023-09-01: 1200 [IU]/h via INTRAVENOUS
  Administered 2023-09-01: 1400 [IU]/h via INTRAVENOUS
  Filled 2023-09-01 (×3): qty 250

## 2023-09-01 MED ORDER — HEPARIN BOLUS VIA INFUSION
4600.0000 [IU] | Freq: Once | INTRAVENOUS | Status: AC
  Administered 2023-09-01: 4600 [IU] via INTRAVENOUS
  Filled 2023-09-01: qty 4600

## 2023-09-01 MED ORDER — ZINC OXIDE 40 % EX OINT
TOPICAL_OINTMENT | CUTANEOUS | Status: DC | PRN
  Administered 2023-09-01: 1 via TOPICAL
  Filled 2023-09-01 (×2): qty 113

## 2023-09-01 MED ORDER — SODIUM CHLORIDE 0.9 % IV SOLN
2.0000 g | Freq: Three times a day (TID) | INTRAVENOUS | Status: DC
  Administered 2023-09-01 – 2023-09-03 (×7): 2 g via INTRAVENOUS
  Filled 2023-09-01 (×9): qty 12.5

## 2023-09-01 MED ORDER — IOHEXOL 350 MG/ML SOLN
75.0000 mL | Freq: Once | INTRAVENOUS | Status: AC | PRN
  Administered 2023-09-01: 75 mL via INTRAVENOUS

## 2023-09-01 MED ORDER — ORAL CARE MOUTH RINSE: 15.0000 mL | OROMUCOSAL | Status: DC | PRN

## 2023-09-01 MED ORDER — CHLORHEXIDINE GLUCONATE CLOTH 2 % EX PADS
6.0000 | MEDICATED_PAD | Freq: Every day | CUTANEOUS | Status: DC
  Administered 2023-09-01 – 2023-09-18 (×18): 6 via TOPICAL

## 2023-09-01 MED ORDER — NYSTATIN 100000 UNIT/ML MT SUSP
5.0000 mL | Freq: Four times a day (QID) | OROMUCOSAL | Status: DC
  Administered 2023-09-01 – 2023-09-13 (×44): 500000 [IU] via ORAL
  Filled 2023-09-01 (×48): qty 5

## 2023-09-01 MED ORDER — HEPARIN BOLUS VIA INFUSION
2500.0000 [IU] | Freq: Once | INTRAVENOUS | Status: AC
  Administered 2023-09-01: 2500 [IU] via INTRAVENOUS
  Filled 2023-09-01: qty 2500

## 2023-09-01 NOTE — Consult Note (Signed)
 PHARMACY - ANTICOAGULATION CONSULT NOTE  Pharmacy Consult for Heparin  Indication: pulmonary embolus  Allergies  Allergen Reactions   Penicillins Rash and Swelling    .Has patient had a PCN reaction causing immediate rash, facial/tongue/throat swelling, SOB or lightheadedness with hypotension: Unknown Has patient had a PCN reaction causing severe rash involving mucus membranes or skin necrosis: Unknown Has patient had a PCN reaction that required hospitalization: Unknown Has patient had a PCN reaction occurring within the last 10 years: Unknown If all of the above answers are "NO", then may proceed with Cephalosporin use.    Sulfa Antibiotics Rash and Swelling   Patient Measurements: Height: 5\' 8"  (172.7 cm) Weight: 74.6 kg (164 lb 7.4 oz) IBW/kg (Calculated) : 68.4 HEPARIN  DW (KG): 77  Vital Signs: Temp: 98.5 F (36.9 C) (04/20 0725) Temp Source: Axillary (04/20 0725) BP: 124/74 (04/20 0700) Pulse Rate: 101 (04/20 0700)  Labs: Recent Labs    08/31/23 1128 08/31/23 1754 09/01/23 0400  HGB 12.4*  --  10.2*  HCT 38.0*  --  31.6*  PLT 419*  --  286  CREATININE 1.39*  --  0.97  CKTOTAL  --   --  72  TROPONINIHS 14 12  --    Estimated Creatinine Clearance: 70.5 mL/min (by C-G formula based on SCr of 0.97 mg/dL).  Medical History: Past Medical History:  Diagnosis Date   CHF (congestive heart failure) (HCC)    Chronic kidney disease    COPD (chronic obstructive pulmonary disease) (HCC)    Dementia (HCC)    Medications:  No PTA anticoagulation  Received SQ heparin  4/20 @ 0544  Assessment: 68 year old male that presented with respiratory distress. CT angio shows a pulmonary embolus within the right superior pulmonary artery. Pharmacy has been consulted for initiaiton and management of a heparin  infusion. Patient not on any anticoagulation PTA. Baseline labs: Hgb 10.2, PLT 286, aPTT and INR have been ordered.   Goal of Therapy:  Heparin  level 0.3-0.7 units/ml Monitor  platelets by anticoagulation protocol: Yes   Plan:  Give 4600 unit bolus  Start heparin  infusion at 1200 units/hr  Check HL 6 hours after start of infusion  Monitor CBC daily and for s/sx of bleeding   Pansy Bogus, PharmD Pharmacy Resident  09/01/2023 10:39 AM

## 2023-09-01 NOTE — Consult Note (Signed)
 PHARMACY - ANTICOAGULATION CONSULT NOTE  Pharmacy Consult for Heparin  Indication: pulmonary embolus  Allergies  Allergen Reactions   Penicillins Rash and Swelling    .Has patient had a PCN reaction causing immediate rash, facial/tongue/throat swelling, SOB or lightheadedness with hypotension: Unknown Has patient had a PCN reaction causing severe rash involving mucus membranes or skin necrosis: Unknown Has patient had a PCN reaction that required hospitalization: Unknown Has patient had a PCN reaction occurring within the last 10 years: Unknown If all of the above answers are "NO", then may proceed with Cephalosporin use.    Sulfa Antibiotics Rash and Swelling   Patient Measurements: Height: 5\' 8"  (172.7 cm) Weight: 74.6 kg (164 lb 7.4 oz) IBW/kg (Calculated) : 68.4 HEPARIN  DW (KG): 77  Vital Signs: Temp: 98.7 F (37.1 C) (04/20 1600) Temp Source: Axillary (04/20 1600) BP: 154/80 (04/20 1900) Pulse Rate: 110 (04/20 1900)  Labs: Recent Labs    08/31/23 1128 08/31/23 1754 09/01/23 0400 09/01/23 1123 09/01/23 1842  HGB 12.4*  --  10.2*  --   --   HCT 38.0*  --  31.6*  --   --   PLT 419*  --  286  --   --   APTT  --   --   --  33  --   LABPROT  --   --   --  16.0*  --   INR  --   --   --  1.3*  --   HEPARINUNFRC  --   --   --   --  0.16*  CREATININE 1.39*  --  0.97  --   --   CKTOTAL  --   --  72  --   --   TROPONINIHS 14 12  --   --   --    Estimated Creatinine Clearance: 70.5 mL/min (by C-G formula based on SCr of 0.97 mg/dL).  Medical History: Past Medical History:  Diagnosis Date   CHF (congestive heart failure) (HCC)    Chronic kidney disease    COPD (chronic obstructive pulmonary disease) (HCC)    Dementia (HCC)    Medications:  No PTA anticoagulation  Received SQ heparin  4/20 @ 0544  Assessment: 68 year old male that presented with respiratory distress. CT angio shows a pulmonary embolus within the right superior pulmonary artery. Pharmacy has been  consulted for initiaiton and management of a heparin  infusion. Patient not on any anticoagulation PTA. Drop in Hgb, but possibly hemodilutional.   4/20 1842 HL 0.16.   Goal of Therapy:  Heparin  level 0.3-0.7 units/ml Monitor platelets by anticoagulation protocol: Yes   Plan:  Heparin  level is subtherapeutic. Will give heparin  bolus of 2500 units x 1 and increase the heparin  infusion to 1400 units/hr. Recheck heparin  level and CBC in 6 hours.   Trinidad Funk, PharmD 09/01/2023 7:41 PM

## 2023-09-01 NOTE — Plan of Care (Signed)
  Problem: Fluid Volume: Goal: Hemodynamic stability will improve Outcome: Not Progressing   Problem: Clinical Measurements: Goal: Diagnostic test results will improve Outcome: Not Progressing Goal: Signs and symptoms of infection will decrease Outcome: Not Progressing   Problem: Respiratory: Goal: Ability to maintain adequate ventilation will improve Outcome: Not Progressing   Problem: Education: Goal: Knowledge of General Education information will improve Description: Including pain rating scale, medication(s)/side effects and non-pharmacologic comfort measures Outcome: Not Progressing   Problem: Health Behavior/Discharge Planning: Goal: Ability to manage health-related needs will improve Outcome: Not Progressing   Problem: Clinical Measurements: Goal: Ability to maintain clinical measurements within normal limits will improve Outcome: Not Progressing Goal: Will remain free from infection Outcome: Not Progressing Goal: Diagnostic test results will improve Outcome: Not Progressing Goal: Respiratory complications will improve Outcome: Not Progressing Goal: Cardiovascular complication will be avoided Outcome: Not Progressing   Problem: Activity: Goal: Risk for activity intolerance will decrease Outcome: Not Progressing   Problem: Nutrition: Goal: Adequate nutrition will be maintained Outcome: Not Progressing   Problem: Coping: Goal: Level of anxiety will decrease Outcome: Not Progressing   Problem: Elimination: Goal: Will not experience complications related to bowel motility Outcome: Not Progressing Goal: Will not experience complications related to urinary retention Outcome: Not Progressing   Problem: Pain Managment: Goal: General experience of comfort will improve and/or be controlled Outcome: Not Progressing   Problem: Safety: Goal: Ability to remain free from injury will improve Outcome: Not Progressing   Problem: Skin Integrity: Goal: Risk for  impaired skin integrity will decrease Outcome: Not Progressing   Problem: Activity: Goal: Ability to tolerate increased activity will improve Outcome: Not Progressing   Problem: Clinical Measurements: Goal: Ability to maintain a body temperature in the normal range will improve Outcome: Not Progressing   Problem: Respiratory: Goal: Ability to maintain adequate ventilation will improve Outcome: Not Progressing Goal: Ability to maintain a clear airway will improve Outcome: Not Progressing

## 2023-09-01 NOTE — Evaluation (Signed)
 Clinical/Bedside Swallow Evaluation Patient Details  Name: Paul Vaughan MRN: [id number] Date of Birth: 25-May-1955  Today's Date: 09/01/2023 Time: SLP Start Time (ACUTE ONLY): 0815 SLP Stop Time (ACUTE ONLY): 0905 SLP Time Calculation (min) (ACUTE ONLY): 50 min  Past Medical History:  Past Medical History:  Diagnosis Date   CHF (congestive heart failure) (HCC)    Chronic kidney disease    COPD (chronic obstructive pulmonary disease) (HCC)    Dementia (HCC)    Past Surgical History: History reviewed. No pertinent surgical history. HPI:  Pt is a 68 year old male with history of intellectual learning difficulty/dementia, adjustment dis., non-insulin -dependent diabetes mellitus, hyperlipidemia, neuropathy, COPD, on baseline 4 L nasal cannula, heart failure preserved ejection fraction, who presents emergency department for chief concerns of respiratory distress noticed by nursing staff.  Pt was just recently admitted during 08/16/23 from his facility w/ poor appetite/po intake and increasing altered mental status.   CXR: Improved aeration at the right apex but new left perihilar and right  basilar opacities compared to prior, suspicious for bilateral  pneumonia.  CT Of Chest today: Lungs/Pleura: Centrilobular emphysematous changes are present.  Patchy airspace opacities are present bilaterally. Consolidative  changes are present posteriorly in the left upper lobe. Findings are compatible with  multifocal pneumonia.  Pulmonary Embolus within the right superior pulmonary artery.    Assessment / Plan / Recommendation  Clinical Impression   Pt seen for BSE today. Pt awake, verbally responded to basic questions re: self w/ head nod only -- no consistent, verbal communication. He followed basic instruction w/ MOD cue. Decreased attention during tasks. Pt has dx'd Dementia and adjustment dis. Baseline. Noted pt winced each time he swallowed but shook his head "No" when asked if he had pain when  swallowing. Pt has been seen by ST services in recent admits w/ mech soft diets/thin liquids ordered then. Edentulous at Baseline.  On [place] O2 support 13L(off BIPAP from last night); afebrile. WBC WNL.  Pt appears to present w/ oropharyngeal phase dysphagia in setting of Acute illness/respiratory decline and declined Cognitive status- Baseline Dementia. ANY Cognitive decline can impact overall awareness/timing of swallow and safety during po tasks which increases risk for aspiration, choking. Pt also seems to experience discomfort when swallowing although shakes his head "No" when asked if he is having pain when swallowing.  Pt's risk for aspiration can be reduced when following general aspiration precautions and using a modified diet consistency of Puree foods d/t Edentulous status at baseline, and Nectar liquids- Both is Small bites/sips. He required Total support w/ feeding this session; cues during po tasks.       Pt was fed TSP trials of Nectar liquids and purees w/ No immediate, overt clinical s/s of aspiration noted: no decline in vocal quality, no cough, and no decline in respiratory status during/post trials. O2 sats remained at his baseline of 91-93%. Oral phase was adequate for bolus management and oral clearing of the boluses given. A-P transfer was adequate. OF NOTE: pt exhibited wincing and effort when swallowing though shook his head "No" when asked if had pain when swallowing.  OM Exam appeared [facility] w/ No unilateral weakness lingual weakness noted. Dry tongue/mouth noted. Some confusion during few OM tasks and oral care noted.          In setting of overall presentation and medical/respiratory/Cognitive status', recommend initiation of the dysphagia level 1(Puree foods) well-moistened for ease of oral phase/swallowing) w/ Nectar liquids; general aspiration precautions; reduce Distractions during meals and engage  pt for self-feeding during meals. SMALL bites/sips- TSP size. Pills Crushed in  Puree for safer swallowing. Support w/ feeding at meals and give encouragement to "try" few po's off/on during the day. MD/NSG updated.   ST services recommends follow w/ Palliative Care for GOC; possible need for ENT for direct view of pharynx. Recommend Dietician f/u for support. ST services will continue to follow pt during admit per POC; trials to upgrade diet as able. Precautions posted in room, chart. SLP Visit Diagnosis: Dysphagia, oropharyngeal phase (R13.12) (Cognitive decline at baseline; Edentulous)    Aspiration Risk  Mild aspiration risk;Risk for inadequate nutrition/hydration    Diet Recommendation   Nectar;Dysphagia 1 (puree) (when accepting of it) = Dysphagia level 1(Puree foods) well-moistened for ease of oral phase/swallowing) w/ Nectar liquids; general aspiration precautions; reduce Distractions during meals and engage pt for self-feeding during meals. SMALL bites/sips- TSP size. Support w/ feeding at meals and give encouragement to "try" few po's off/on during the day.   Medication Administration: Crushed with puree (when accepting of it)    Other  Recommendations Recommended Consults:  (TBD; Dietician. Pallliative Care for GOC) Oral Care Recommendations: Oral care BID;Oral care before and after PO;Staff/trained caregiver to provide oral care Caregiver Recommendations: Avoid jello, ice cream, thin soups, popsicles;Remove water  pitcher;Have oral suction available    Recommendations for follow up therapy are one component of a multi-disciplinary discharge planning process, led by the attending physician.  Recommendations may be updated based on patient status, additional functional criteria and insurance authorization.  Follow up Recommendations Follow physician's recommendations for discharge plan and follow up therapies (TBD)      Assistance Recommended at Discharge  FULL  Functional Status Assessment Patient has had a recent decline in their functional status and/or  demonstrates limited ability to make significant improvements in function in a reasonable and predictable amount of time  Frequency and Duration min 2x/week  2 weeks       Prognosis Prognosis for improved oropharyngeal function: Fair Barriers to Reach Goals: Cognitive deficits;Language deficits;Time post onset;Severity of deficits Barriers/Prognosis Comment: Edentulous status; Dementia/Cognitive decline; need for Feeding support at meals      Swallow Study   General Date of Onset: 08/31/23 HPI: Pt is a 68 year old male with history of intellectual learning difficulty/dementia, adjustment dis., non-insulin -dependent diabetes mellitus, hyperlipidemia, neuropathy, COPD, on baseline 4 L nasal cannula, heart failure preserved ejection fraction, who presents emergency department for chief concerns of respiratory distress noticed by nursing staff.  Pt was just recently admitted during 08/16/23 from his facility w/ poor appetite/po intake and increasing altered mental status.   CXR: Improved aeration at the right apex but new left perihilar and right  basilar opacities compared to prior, suspicious for bilateral  pneumonia.  CT Of Chest today: Lungs/Pleura: Centrilobular emphysematous changes are present.  Patchy airspace opacities are present bilaterally. Consolidative  changes are present posteriorly in the left upper lobe. Findings are compatible with  multifocal pneumonia.  Pulmonary Embolus within the right superior pulmonary artery. Type of Study: Bedside Swallow Evaluation Previous Swallow Assessment: 08/16/23- mech soft, thins; 07/31/2023 - mech soft, thins -- this admit; 06/2021 - regular, thins Diet Prior to this Study: NPO Temperature Spikes Noted: No (wbc 10.1 - has not been outside of WNL even at admit) Respiratory Status: Nasal cannula (13 L; on chronic O2 at facility) History of Recent Intubation: No Behavior/Cognition: Alert;Cooperative;Pleasant mood;Confused;Distractible;Requires cueing Oral  Cavity Assessment: Dry;Dried secretions (on tongue) Oral Care Completed by SLP: Yes Oral Cavity - Dentition:  Edentulous Vision:  (n/a) Self-Feeding Abilities: Total assist Patient Positioning: Upright in bed (MOD++ assist) Baseline Vocal Quality: Low vocal intensity (minimal words) Volitional Cough: Cognitively unable to elicit Volitional Swallow: Unable to elicit    Oral/Motor/Sensory Function Overall Oral Motor/Sensory Function: Within functional limits   Ice Chips Ice chips: Not tested   Thin Liquid Thin Liquid: Not tested    Nectar Thick Nectar Thick Liquid: Within functional limits Presentation: Spoon (9 trials) Other Comments: pt winced and gave increased appearance of effort during the swallow   Honey Thick Honey Thick Liquid: Not tested   Puree Puree: Within functional limits Presentation: Spoon (fed; 6 trials) Other Comments: pt winced and gave increased appearance of effort during the swallow   Solid     Solid: Not tested        Darla Edward, MS, CCC-SLP Speech Language Pathologist Rehab Services; [facility] [phone] (ascom) Hilman Kissling 09/01/2023,12:58 PM

## 2023-09-01 NOTE — Progress Notes (Signed)
 Progress Note   Patient: Paul Vaughan ZOX:[id number] DOB: 12-23-1955 DOA: 08/31/2023     1 DOS: the patient was seen and examined on 09/01/2023   Brief hospital course: [name] is a 68 year old male with history of non-insulin-dependent diabetes mellitus, hyperlipidemia, neuropathy, intellectual learning difficulty/dementia, COPD, on baseline 4 L nasal cannula, heart failure preserved ejection fraction, who presents emergency department for chief concerns of respiratory distress noticed by nursing staff.  Vitals in the ED showed temperature of 101.4, respiration rate of 25, heart rate of 130, blood pressure 104/77, SpO2 of 95% on BiPAP.  Serum sodium is 136, potassium 5.0, chloride 100, bicarb 23, BUN 36, serum creatinine 1.39, EGFR 55, nonfasting blood glucose 126, WBC 9.4, hemoglobin 12.4, platelets of 419.BNP was elevated at 486.2.  High sensitive troponin was 14.  Lactic acid was 5.7 and on repeat 1 hour later was 5.9. COVID/influenza A/influenza B/RSV PCR were negative. Blood cultures x 2 are in process.  ED treatment: DuoNebs one-time treatment, acetaminophen  1000 mg IV one-time dose, azithromycin  500 mg IV one-time dose, ceftriaxone  2 g IV one-time dose, LR 2.5 mL liter bolus were given, and vancomycin  per pharmacy.  Patient was recently admitted from 3/16 till 08/19/2023 when presented with generalized weakness, treated for pneumonia and rhabdo.  Patient did had worsening respiratory status during that admission which was stabilizes and he was discharged on 4 L of oxygen to SNF.  4/20: Afebrile with mild tachycardia, remained on BiPAP.  Patient recently treated for multifocal pneumonia.  Also noted significantly elevated CK meeting criteria for rhabdomyolysis with no history of fall.  CK improved with IV fluid, seems like having chronic intermittently elevated CK. MRSA PCR negative so discontinuing vancomycin .  CTA chest was done due to the significant hypoxia, had a small  PE and right upper lobe-started on heparin  infusion.  Did show multifocal pneumonia.  Procalcitonin significantly elevated at 15.77.  Ordered expanded respiratory panel, strep pneumo and Legionella antigen.  Assessment and Plan: * Severe sepsis with acute organ dysfunction [facility]) Patient had fever of 101.4, increased respiration, increased heart rate, elevated lactic acid, organ involvement or pulmonary and renal.  Likely secondary to multifocal bilateral pneumonia.  Procalcitonin elevated at 15.77 and preliminary blood cultures negative in 24 hours.  MRSA PCR negative, improving lactic acidosis but still elevated at 2.9 -Ordered expanded RVP - Checking strep pneumo and Legionella antigen - Continue with Zithromax  and cefepime  - Discontinue vancomycin  - Monitor lactic acid - Continue with supportive care - Continue with IV fluid  Multifocal pneumonia - Please see above  Acute pulmonary embolism (HCC) CTA was done due to acute profound hypoxia and patient with recent prolonged hospitalization and decreased mobility. Came back positive for a small upper lobe PE -Starting on heparin  infusion  Acute on chronic hypoxic respiratory failure (HCC) Likely multifactorial with bilateral multifocal pneumonia, CTA also shows a small right upper lobe PE,  Able to wean off from BiPAP but needing 12 to 13 L of oxygen with baseline of 4 L use. -Continue with supplemental oxygen-wean to baseline as tolerated  AKI (acute kidney injury) (HCC) Improved with IV fluid. -Monitor renal function -Avoid nephrotoxins  Elevated LFTs Improved, likely secondary to sepsis.  Chronic diastolic CHF (congestive heart failure) (HCC) Prior echocardiogram done last year with normal EF and grade 1 diastolic dysfunction.  Elevated BNP at 483.  Clinically appears euvolemic.  Patient did receive IV fluid due to sepsis and lactic acidosis. - Repeat echocardiogram ordered-pending -Monitor volume status closely  Essential  (primary) hypertension Mildly elevated blood pressure this morning, patient was not on any antihypertensives at baseline. Hydralazine  5 mg IV every 6 hours as needed for SBP > 175,  Continue to monitor  Adjustment disorder with mixed disturbance of emotions and conduct Home quetiapine  150 mg nightly, quetiapine  50 mg in the morning and in the afternoon were resumed on admission  Chronic depression Home escitalopram  5 mg nightly resumed  Hyperlipidemia, unspecified Home rosuvastatin  20 mg nightly resumed on admission Home Praluent not resumed on admission   Subjective: Patient was seen and examined today.  Oriented to self only and denies any pain.  Physical Exam: Vitals:   09/01/23 1130 09/01/23 1145 09/01/23 1200 09/01/23 1215  BP:   (!) 159/87   Pulse: (!) 107 (!) 105 (!) 111 (!) 112  Resp: (!) 28 (!) 26 (!) 27 (!) 28  Temp:  98.9 F (37.2 C)    TempSrc:  Axillary    SpO2: (!) 89% 92% 92% 93%  Weight:      Height:       General.  Severely malnourished gentleman, in no acute distress. Pulmonary.  Lungs clear bilaterally, normal respiratory effort. CV.  Regular rate and rhythm, no JVD, rub or murmur. Abdomen.  Soft, nontender, nondistended, BS positive. CNS.  Alert and oriented to self.  No focal neurologic deficit. Extremities.  No edema, no cyanosis, pulses intact and symmetrical.  Data Reviewed: Prior data reviewed  Family Communication: DSS is legal guardian  Disposition: Status is: Inpatient Remains inpatient appropriate because: Severity of illness  Planned Discharge Destination: Skilled nursing facility  DVT prophylaxis.  Heparin  infusion Time spent: 50 minutes  This record has been created using Conservation officer, historic buildings. Errors have been sought and corrected,but may not always be located. Such creation errors do not reflect on the standard of care.   Author: Luna Salinas, MD 09/01/2023 12:42 PM  For on call review [email or web address].

## 2023-09-01 NOTE — Plan of Care (Signed)
 Discussed with patient plan of care for the evening, pain management and drinking or eating while on the Bipap with some teach back displayed.  Problem: Fluid Volume: Goal: Hemodynamic stability will improve Outcome: Not Progressing   Problem: Respiratory: Goal: Ability to maintain adequate ventilation will improve Outcome: Not Progressing

## 2023-09-01 NOTE — Assessment & Plan Note (Signed)
 Prior echocardiogram done last year with normal EF and grade 1 diastolic dysfunction.  Elevated BNP at 483.  Clinically appears euvolemic.  Patient did receive IV fluid due to sepsis and lactic acidosis. - Repeat echocardiogram ordered-pending -Monitor volume status closely

## 2023-09-01 NOTE — Plan of Care (Signed)
  Problem: Fluid Volume: Goal: Hemodynamic stability will improve Outcome: Progressing   Problem: Activity: Goal: Risk for activity intolerance will decrease Outcome: Progressing   Problem: Elimination: Goal: Will not experience complications related to bowel motility Outcome: Progressing   Problem: Safety: Goal: Ability to remain free from injury will improve Outcome: Progressing   Problem: Skin Integrity: Goal: Risk for impaired skin integrity will decrease Outcome: Progressing

## 2023-09-01 NOTE — Assessment & Plan Note (Signed)
 CTA was done due to acute profound hypoxia and patient with recent prolonged hospitalization and decreased mobility. Came back positive for a small upper lobe PE -Starting on heparin  infusion

## 2023-09-02 ENCOUNTER — Other Ambulatory Visit (HOSPITAL_COMMUNITY): Payer: Self-pay

## 2023-09-02 ENCOUNTER — Telehealth (HOSPITAL_COMMUNITY): Payer: Self-pay | Admitting: Pharmacy Technician

## 2023-09-02 ENCOUNTER — Inpatient Hospital Stay
Admit: 2023-09-02 | Discharge: 2023-09-02 | Disposition: A | Discharge status: Home / Self Care | Attending: Internal Medicine | Admitting: Internal Medicine

## 2023-09-02 DIAGNOSIS — J189 Pneumonia, unspecified organism: Secondary | ICD-10-CM | POA: Diagnosis not present

## 2023-09-02 DIAGNOSIS — I2699 Other pulmonary embolism without acute cor pulmonale: Secondary | ICD-10-CM | POA: Diagnosis not present

## 2023-09-02 DIAGNOSIS — A419 Sepsis, unspecified organism: Secondary | ICD-10-CM | POA: Diagnosis not present

## 2023-09-02 DIAGNOSIS — J9621 Acute and chronic respiratory failure with hypoxia: Secondary | ICD-10-CM | POA: Diagnosis not present

## 2023-09-02 LAB — BASIC METABOLIC PANEL WITH GFR
Anion gap: 10 (ref 5–15)
BUN: 41 mg/dL — ABNORMAL HIGH (ref 8–23)
CO2: 22 mmol/L (ref 22–32)
Calcium: 9 mg/dL (ref 8.9–10.3)
Chloride: 105 mmol/L (ref 98–111)
Creatinine, Ser: 0.95 mg/dL (ref 0.61–1.24)
GFR, Estimated: >60 mL/min (ref >60)
Glucose, Bld: 93 mg/dL (ref 70–99)
Potassium: 4.3 mmol/L (ref 3.5–5.1)
Sodium: 137 mmol/L (ref 135–145)

## 2023-09-02 LAB — CBC
HCT: 30.9 % — ABNORMAL LOW (ref 39.0–52.0)
Hemoglobin: 10.1 g/dL — ABNORMAL LOW (ref 13.0–17.0)
MCH: 32.6 pg (ref 26.0–34.0)
MCHC: 32.7 g/dL (ref 30.0–36.0)
MCV: 99.7 fL (ref 80.0–100.0)
Platelets: 220 10*3/uL (ref 150–400)
RBC: 3.1 MIL/uL — ABNORMAL LOW (ref 4.22–5.81)
RDW: 15 % (ref 11.5–15.5)
WBC: 9.1 10*3/uL (ref 4.0–10.5)
nRBC: 0 % (ref 0.0–0.2)

## 2023-09-02 LAB — LACTIC ACID, PLASMA: Lactic Acid, Venous: 1.8 mmol/L (ref 0.5–1.9)

## 2023-09-02 LAB — PROCALCITONIN: Procalcitonin: 6.95 ng/mL

## 2023-09-02 LAB — HEPARIN LEVEL (UNFRACTIONATED): Heparin Unfractionated: 0.37 [IU]/mL (ref 0.30–0.70)

## 2023-09-02 MED ORDER — APIXABAN 5 MG PO TABS: 5.0000 mg | ORAL_TABLET | Freq: Two times a day (BID) | ORAL | Status: DC

## 2023-09-02 MED ORDER — FUROSEMIDE 10 MG/ML IJ SOLN
40.0000 mg | Freq: Once | INTRAMUSCULAR | Status: AC
  Administered 2023-09-02: 40 mg via INTRAVENOUS
  Filled 2023-09-02: qty 4

## 2023-09-02 MED ORDER — APIXABAN 5 MG PO TABS
10.0000 mg | ORAL_TABLET | Freq: Two times a day (BID) | ORAL | Status: DC
  Administered 2023-09-02 – 2023-09-03 (×4): 10 mg via ORAL
  Filled 2023-09-02 (×4): qty 2

## 2023-09-02 MED ORDER — QUETIAPINE FUMARATE 100 MG PO TABS
100.0000 mg | ORAL_TABLET | Freq: Every day | ORAL | Status: DC
  Administered 2023-09-02 – 2023-09-03 (×2): 100 mg via ORAL
  Filled 2023-09-02 (×2): qty 1

## 2023-09-02 NOTE — Progress Notes (Signed)
 MD notified in person the patient was placed on high flow nasal cannula again currently on 13 liters. The patient did eat one cup of apple sauce with meds and drank 3/4 of his container of milk this morning. Current O2 is 96% on 13 L of O2 and resp in 20's.

## 2023-09-02 NOTE — Progress Notes (Addendum)
 MD has been notified that the patient was placed back on Bipap as he desats after eating. Oxygen dropped after eating lunch to 80% and after dinner oxygen dropped to 84%.   MD notified sputum sample has not been able to be obtained as the patient has a weak cough making it difficult for the patient to provide sample.

## 2023-09-02 NOTE — Assessment & Plan Note (Signed)
 Improved with IV fluid. -Monitor renal function -Avoid nephrotoxins

## 2023-09-02 NOTE — Plan of Care (Signed)

## 2023-09-02 NOTE — Progress Notes (Signed)
 Meal intake today  09/02/23 0800 09/02/23 1201 09/02/23 1741  Intake (mL)  P.O. 70 mL 80 mL 50 mL  Percent Meals Eaten (%) 6 % 15 % 10 %  Feeding Total assist Total assist Total assist

## 2023-09-02 NOTE — Consult Note (Signed)
 PHARMACY - ANTICOAGULATION CONSULT NOTE  Pharmacy Consult for Heparin  Indication: pulmonary embolus  Allergies  Allergen Reactions   Penicillins Rash and Swelling    .Has patient had a PCN reaction causing immediate rash, facial/tongue/throat swelling, SOB or lightheadedness with hypotension: Unknown Has patient had a PCN reaction causing severe rash involving mucus membranes or skin necrosis: Unknown Has patient had a PCN reaction that required hospitalization: Unknown Has patient had a PCN reaction occurring within the last 10 years: Unknown If all of the above answers are "NO", then may proceed with Cephalosporin use.    Sulfa Antibiotics Rash and Swelling   Patient Measurements: Height: 5\' 8"  (172.7 cm) Weight: 74.6 kg (164 lb 7.4 oz) IBW/kg (Calculated) : 68.4 HEPARIN  DW (KG): 77  Vital Signs: Temp: 97.8 F (36.6 C) (04/21 0300) Temp Source: Axillary (04/21 0300) BP: 161/72 (04/21 0400) Pulse Rate: 27 (04/21 0500)  Labs: Recent Labs    08/31/23 1128 08/31/23 1754 09/01/23 0400 09/01/23 1123 09/01/23 1842 09/02/23 0432 09/02/23 0451  HGB 12.4*  --  10.2*  --   --  10.1*  --   HCT 38.0*  --  31.6*  --   --  30.9*  --   PLT 419*  --  286  --   --  220  --   APTT  --   --   --  33  --   --   --   LABPROT  --   --   --  16.0*  --   --   --   INR  --   --   --  1.3*  --   --   --   HEPARINUNFRC  --   --   --   --  0.16*  --  0.37  CREATININE 1.39*  --  0.97  --   --  0.95  --   CKTOTAL  --   --  72  --   --   --   --   TROPONINIHS 14 12  --   --   --   --   --    Estimated Creatinine Clearance: 72 mL/min (by C-G formula based on SCr of 0.95 mg/dL).  Medical History: Past Medical History:  Diagnosis Date   CHF (congestive heart failure) (HCC)    Chronic kidney disease    COPD (chronic obstructive pulmonary disease) (HCC)    Dementia (HCC)    Medications:  No PTA anticoagulation  Received SQ heparin  4/20 @ 0544  Assessment: 68 year old male that presented  with respiratory distress. CT angio shows a pulmonary embolus within the right superior pulmonary artery. Pharmacy has been consulted for initiaiton and management of a heparin  infusion. Patient not on any anticoagulation PTA. Drop in Hgb, but possibly hemodilutional.   4/20 1842 HL 0.16 4/21 0451 HL 0.37, therapeutic x 1  Goal of Therapy:  Heparin  level 0.3-0.7 units/ml Monitor platelets by anticoagulation protocol: Yes   Plan:  Continue the heparin  infusion at 1400 units/hr.  Recheck HL in 6 hrs to confirm CBC daily while on heparin    Coretta Dexter, PharmD, [facility] 09/02/2023 5:32 AM

## 2023-09-02 NOTE — Progress Notes (Signed)
*  PRELIMINARY RESULTS* Echocardiogram 2D Echocardiogram has been performed.  Paul Vaughan 09/02/2023, 8:54 AM

## 2023-09-02 NOTE — Progress Notes (Signed)
*  PRELIMINARY RESULTS* Echocardiogram 2D Echocardiogram has been performed.  Broadus Canes 09/02/2023, 8:54 AM

## 2023-09-02 NOTE — Consult Note (Signed)
 PULMONOLOGY         Date: 09/02/2023,   MRN# [id number] Paul Vaughan Jan 14, 1956     AdmissionWeight: 77 kg                 CurrentWeight: 74.6 kg  Referring provider: Dr Ariel Begun   CHIEF COMPLAINT:    Sepsis with shock   HISTORY OF PRESENT ILLNESS   68 yo M with hx of DM, COPD, dyslipidemia, chronic hypoxemia, cognitive impairment, CHF came in febrile hypoxemic requiring BIPAP. He had viral workup done which was negative, he had blood cultures done which are negative x2d.  Cardiac biomarkers have been elevated. Patient has care giver from DSS and I met with [name] to review care plan. He had speech/swallow evaluation with goal to use thickened pureed nourishment.    PAST MEDICAL HISTORY   Past Medical History:  Diagnosis Date   CHF (congestive heart failure) (HCC)    Chronic kidney disease    COPD (chronic obstructive pulmonary disease) (HCC)    Dementia (HCC)      SURGICAL HISTORY   History reviewed. No pertinent surgical history.   FAMILY HISTORY   Family History  Family history unknown: Yes     SOCIAL HISTORY   Social History   Tobacco Use   Smoking status: Every Day    Current packs/day: 1.00    Types: Cigarettes   Smokeless tobacco: Never  Substance Use Topics   Alcohol use: No   Drug use: No     MEDICATIONS    Home Medication:    Current Medication:  Current Facility-Administered Medications:    acetaminophen  (TYLENOL ) tablet 650 mg, 650 mg, Oral, Q6H PRN, 650 mg at 09/01/23 2027 **OR** acetaminophen  (TYLENOL ) suppository 650 mg, 650 mg, Rectal, Q6H PRN, Cox, Amy N, DO   apixaban  (ELIQUIS ) tablet 10 mg, 10 mg, Oral, BID, 10 mg at 09/02/23 0926 **FOLLOWED BY** [START ON 09/09/2023] apixaban  (ELIQUIS ) tablet 5 mg, 5 mg, Oral, BID, Amin, Sumayya, MD   aspirin  EC tablet 81 mg, 81 mg, Oral, q AM, Cox, Amy N, DO, 81 mg at 09/02/23 0710   azithromycin  (ZITHROMAX ) 500 mg in sodium chloride  0.9 % 250 mL IVPB, 500 mg,  Intravenous, Q24H, Cox, Amy N, DO, Stopped at 09/01/23 1243   ceFEPIme  (MAXIPIME ) 2 g in sodium chloride  0.9 % 100 mL IVPB, 2 g, Intravenous, Q8H, Nazari, Walid A, RPH, Last Rate: 200 mL/hr at 09/02/23 0936, 2 g at 09/02/23 4098   Chlorhexidine  Gluconate Cloth 2 % PADS 6 each, 6 each, Topical, Q1400, Cox, Amy N, DO, 6 each at 09/01/23 1500   divalproex  (DEPAKOTE  ER) 24 hr tablet 500 mg, 500 mg, Oral, Daily, Cox, Amy N, DO, 500 mg at 09/02/23 1191   divalproex  (DEPAKOTE  ER) 24 hr tablet 500 mg, 500 mg, Oral, QHS, Ramonita Burow, RPH, 500 mg at 09/01/23 2027   escitalopram  (LEXAPRO ) tablet 5 mg, 5 mg, Oral, QHS, Cox, Amy N, DO, 5 mg at 09/01/23 2026   feeding supplement (ENSURE ENLIVE / ENSURE PLUS) liquid 237 mL, 237 mL, Oral, TID BM, Cox, Amy N, DO, 237 mL at 09/02/23 0932   folic acid  (FOLVITE ) tablet 1 mg, 1 mg, Oral, q AM, Cox, Amy N, DO, 1 mg at 09/02/23 0710   gabapentin  (NEURONTIN ) capsule 100 mg, 100 mg, Oral, TID, Cox, Amy N, DO, 100 mg at 09/02/23 0931   liver oil-zinc  oxide (DESITIN) 40 % ointment, , Topical, PRN, Amin, Sumayya, MD, 1 Application  at 09/01/23 1754   multivitamin with minerals tablet 1 tablet, 1 tablet, Oral, Daily, Cox, Amy N, DO, 1 tablet at 09/02/23 8413   nystatin  (MYCOSTATIN ) 100000 UNIT/ML suspension 500,000 Units, 5 mL, Oral, QID, Amin, Sumayya, MD, 500,000 Units at 09/01/23 2025   ondansetron  (ZOFRAN ) tablet 4 mg, 4 mg, Oral, Q6H PRN **OR** ondansetron  (ZOFRAN ) injection 4 mg, 4 mg, Intravenous, Q6H PRN, Cox, Amy N, DO   Oral care mouth rinse, 15 mL, Mouth Rinse, PRN, Cox, Amy N, DO   Oral care mouth rinse, 15 mL, Mouth Rinse, PRN, Luna Salinas, MD   pyridOXINE  (VITAMIN B6) tablet 100 mg, 100 mg, Oral, QHS, Cox, Amy N, DO, 100 mg at 09/01/23 2025   QUEtiapine  (SEROQUEL ) tablet 150 mg, 150 mg, Oral, QHS, Cox, Amy N, DO, 150 mg at 09/01/23 2027   QUEtiapine  (SEROQUEL ) tablet 50 mg, 50 mg, Oral, 2 times per day, Cox, Amy N, DO, 50 mg at 09/02/23 0710    rosuvastatin  (CRESTOR ) tablet 20 mg, 20 mg, Oral, QHS, Cox, Amy N, DO, 20 mg at 09/01/23 2028   thiamine  (VITAMIN B1) tablet 100 mg, 100 mg, Oral, q AM, Cox, Amy N, DO, 100 mg at 09/02/23 0710    ALLERGIES   Penicillins and Sulfa antibiotics     REVIEW OF SYSTEMS    Review of Systems:  Gen:  Denies  fever, sweats, chills weigh loss  HEENT: Denies blurred vision, double vision, ear pain, eye pain, hearing loss, nose bleeds, sore throat Cardiac:  No dizziness, chest pain or heaviness, chest tightness,edema Resp:   reports dyspnea chronically  Gi: Denies swallowing difficulty, stomach pain, nausea or vomiting, diarrhea, constipation, bowel incontinence Gu:  Denies bladder incontinence, burning urine Ext:   Denies Joint pain, stiffness or swelling Skin: Denies  skin rash, easy bruising or bleeding or hives Endoc:  Denies polyuria, polydipsia , polyphagia or weight change Psych:   Denies depression, insomnia or hallucinations   Other:  All other systems negative   VS: BP (!) 162/82   Pulse (!) 108   Temp 99.1 F (37.3 C) (Axillary)   Resp (!) 28   Ht 5\' 8"  (1.727 m)   Wt 74.6 kg   SpO2 91%   BMI 25.01 kg/m      PHYSICAL EXAM    GENERAL:NAD, no fevers, chills, no weakness no fatigue HEAD: Normocephalic, atraumatic.  EYES: Pupils equal, round, reactive to light. Extraocular muscles intact. No scleral icterus.  MOUTH: Moist mucosal membrane. Dentition intact. No abscess noted.  EAR, NOSE, THROAT: Clear without exudates. No external lesions.  NECK: Supple. No thyromegaly. No nodules. No JVD.  PULMONARY: decreased breath sounds with mild rhonchi worse at bases bilaterally.  CARDIOVASCULAR: S1 and S2. Regular rate and rhythm. No murmurs, rubs, or gallops. No edema. Pedal pulses 2+ bilaterally.  GASTROINTESTINAL: Soft, nontender, nondistended. No masses. Positive bowel sounds. No hepatosplenomegaly.  MUSCULOSKELETAL: No swelling, clubbing, or edema. Range of motion full in  all extremities.  NEUROLOGIC: Cranial nerves II through XII are intact. No gross focal neurological deficits. Sensation intact. Reflexes intact.  SKIN: No ulceration, lesions, rashes, or cyanosis. Skin warm and dry. Turgor intact.  PSYCHIATRIC: Mood, affect within normal limits. The patient is awake, alert and oriented x 3. Insight, judgment intact.       IMAGING   Reviewed CT chest - 09/01/23- multifocal pneumonia and PE  ASSESSMENT/PLAN   Acute hypoxemic respiratory failure     - continue antbitics- patient with multifocal pneumonia - dc cefepime ,  continue zithromax  start rocephin     Pulmonary embolism     - continue anticoagulation    Pulmonary edema and pleural effusion      - lasix  40 bid    Bibasilar atelectasis     - bed chest pt due to cognitive impairment    - VEST therapy      Bilateral mucus plugging     - mucomyst  20 % 4ml BID       Thank you for allowing me to participate in the care of this patient.   Patient/Family are satisfied with care plan and all questions have been answered.    Provider disclosure: Patient with at least one acute or chronic illness or injury that poses a threat to life or bodily function and is being managed actively during this encounter.  All of the below services have been performed independently by signing provider:  review of prior documentation from internal and or external health records.  Review of previous and current lab results.  Interview and comprehensive assessment during patient visit today. Review of current and previous chest radiographs/CT scans. Discussion of management and test interpretation with health care team and patient/family.   This document was prepared using Dragon voice recognition software and may include unintentional dictation errors.     Cotina Freedman, M.D.  Division of Pulmonary & Critical Care Medicine

## 2023-09-02 NOTE — Progress Notes (Addendum)
 RN notified MD about ordering oral nystatin  for fungus and nystatin  powder for scrotum.

## 2023-09-02 NOTE — TOC Initial Note (Signed)
 Transition of Care [facility]) - Initial/Assessment Note    Patient Details  Name: Paul Vaughan MRN: [id number] Date of Birth: 1955/11/19  [facility]) CM/SW Contact:    Olar Santini A Ewin Rehberg, RN Phone Number: 09/02/2023, 3:47 PM  Clinical Narrative:                 Chart reviewed.  Noted that patient was admitted with Severe Sepsis with acute organ dysfunction.  Patient is also being treated with Multifocal PNA and PE.  Patient is currently on IV Zithromax  and IV Cefepime .  Patient also started on IV Heparin  drip for PE.  Patient is currently require Bipap and 15 liters of 02.   Noted that patient is from [facility].  I have spoken with [name], Admissions Coordinator from [facility].  She informs me that patient was a receiving short term rehab at [facility].  [name] reports that patient is able to return back to [facility] on discharge.  [name] informs me that patient was on 02 at 3L per [place] at the facility.    I have spoken with patient's guardian Paul Vaughan.  Her contact numbers are:  [phone] and cell phone is [phone].  Paul reports that she would like for patient to return back to [facility] when stable for discharge.       Expected Discharge Plan: Skilled Nursing Facility Barriers to Discharge: Continued Medical Work up   Patient Goals and CMS Choice            Expected Discharge Plan and Services   Discharge Planning Services: CM Consult Post Acute Care Choice: Skilled Nursing Facility Living arrangements for the past 2 months: Skilled Nursing Facility                                      Prior Living Arrangements/Services Living arrangements for the past 2 months: Skilled Nursing Facility                     Activities of Daily Living   ADL Screening (condition at time of admission) Independently performs ADLs?: Yes (appropriate for developmental age) Is the patient deaf or have difficulty hearing?:  No Does the patient have difficulty seeing, even when wearing glasses/contacts?: No Does the patient have difficulty concentrating, remembering, or making decisions?: Yes  Permission Sought/Granted                  Emotional Assessment              Admission diagnosis:  Severe sepsis with acute organ dysfunction (HCC) [A41.9, R65.20] Multifocal pneumonia [J18.9] Sepsis with acute hypoxic respiratory failure without septic shock, due to unspecified organism (HCC) [A41.9, R65.20, J96.01] Patient Active Problem List   Diagnosis Date Noted   Acute pulmonary embolism (HCC) 09/01/2023   Severe sepsis with acute organ dysfunction (HCC) 08/31/2023   Acute on chronic hypoxic respiratory failure (HCC) 08/31/2023   Multifocal pneumonia 07/29/2023   Chronic diastolic CHF (congestive heart failure) (HCC) 07/29/2023   Rhabdomyolysis 07/29/2023   Acute hypoxic respiratory failure (HCC) 07/28/2023   Elevated LFTs 07/28/2023   Thrombocytopenia (HCC) 07/01/2021   Anemia of chronic disease 07/01/2021   AKI (acute kidney injury) (HCC) 07/01/2021   Severe sepsis (HCC) 06/30/2021   Acute hypoxemic respiratory failure due to COVID-19 (HCC) 06/30/2021   Chronic depression 06/08/2021   Chronic obstructive pulmonary disease, unspecified (HCC) 06/08/2021  Essential (primary) hypertension 06/08/2021   Dementia with behavioral disturbance (HCC) 06/08/2021   Proteinuria, unspecified 06/08/2021   Hyperlipidemia, unspecified 06/08/2021   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 [place] [address] Phone: [phone] Fax: [phone]  [facility] - [place], [place] - [address] [place] [address] Phone: [phone] Fax:  [phone]     Social Drivers of Health (SDOH) Social History: SDOH Screenings   Food Insecurity: Patient Unable To Answer (08/31/2023)  Housing: Patient Unable To Answer (09/01/2023)  Transportation Needs: Patient Unable To Answer (08/31/2023)  Utilities: Patient Unable To Answer (08/31/2023)  Social Connections: Patient Unable To Answer (08/31/2023)  Tobacco Use: High Risk (08/31/2023)   SDOH Interventions:     Readmission Risk Interventions    07/01/2021    1:02 PM  Readmission Risk Prevention Plan  Transportation Screening Complete  PCP or Specialist Appt within 5-7 Days Complete  Home Care Screening Complete  Medication Review (RN CM) Complete

## 2023-09-02 NOTE — Progress Notes (Signed)
 Speech Language Pathology Treatment: Dysphagia  Patient Details Name: Paul Vaughan MRN: [id number] DOB: 1955-11-25 Today's Date: 09/02/2023 Time: 9518-8416 SLP Time Calculation (min) (ACUTE ONLY): 20 min  Assessment / Plan / Recommendation Clinical Impression  Met w/ pt and NSG today as NSG was feeding pt Crushed Pills/Meds in puree, then feeding pt some of his lunch meal. No wincing behaviors noted during oral intake this session (as at evaluation). Pt accepted each bolus readily given cue. Decreased attention during tasks. Pt has dx'd Dementia and adjustment dis. Baseline.  On Green O2 support 13-15L; afebrile. WBC WNL   Pt appears to present w/ oropharyngeal phase dysphagia in setting of Acute illness/respiratory decline and declined Cognitive status- Baseline Dementia. ANY Cognitive decline can impact overall awareness/timing of swallow and safety during po tasks which increases risk for aspiration, choking. Pt also seems to experience discomfort when swallowing although shakes his head "No" when asked if he is having pain when swallowing.  Pt's risk for aspiration can be reduced when following general aspiration precautions and using a modified diet consistency of Puree foods d/t Edentulous status at baseline, and Nectar liquids- Both is Small bites/sips. He required Total support w/ feeding this session; cues during po tasks.  n setting of overall presentation and medical/respiratory/Cognitive status', recommend initiation of the dysphagia level 1(Puree foods) well-moistened for ease of oral phase/swallowing) w/ Nectar liquids; general aspiration precautions; reduce Distractions during meals and engage pt for self-feeding during meals. SMALL bites/sips- TSP size. Pills Crushed in Puree for safer swallowing. Support w/ feeding at meals and give encouragement to "try" few po's off/on during the day. MD/NSG updated.    ST services recommends follow w/ Palliative Care for GOC. Recommend Dietician  f/u for support. ST services will continue to follow pt during admit per POC; trials to upgrade diet as able. Precautions posted in room, chart.        HPI HPI: Pt is a 68 year old male with history of intellectual learning difficulty/dementia, adjustment dis., non-insulin -dependent diabetes mellitus, hyperlipidemia, neuropathy, COPD, on baseline 4 L nasal cannula, heart failure preserved ejection fraction, who presents emergency department for chief concerns of respiratory distress noticed by nursing staff.  Pt was just recently admitted during 08/16/23 from his facility w/ poor appetite/po intake and increasing altered mental status.   CXR: Improved aeration at the right apex but new left perihilar and right  basilar opacities compared to prior, suspicious for bilateral  pneumonia.  CT Of Chest today: Lungs/Pleura: Centrilobular emphysematous changes are present.  Patchy airspace opacities are present bilaterally. Consolidative  changes are present posteriorly in the left upper lobe. Findings are compatible with  multifocal pneumonia.  Pulmonary Embolus within the right superior pulmonary artery.      SLP Plan  Continue with current plan of care      Recommendations for follow up therapy are one component of a multi-disciplinary discharge planning process, led by the attending physician.  Recommendations may be updated based on patient status, additional functional criteria and insurance authorization.    Recommendations  Diet recommendations: Dysphagia 1 (puree);Nectar-thick liquid Liquids provided via: Teaspoon;Cup;No straw Medication Administration: Crushed with puree Supervision: Staff to assist with self feeding;Full supervision/cueing for compensatory strategies Compensations: Minimize environmental distractions;Slow rate;Small sips/bites;Lingual sweep for clearance of pocketing;Multiple dry swallows after each bite/sip;Follow solids with liquid Postural Changes and/or Swallow  Maneuvers: Out of bed for meals;Seated upright 90 degrees;Upright 30-60 min after meal                 (  Palliative Care consult; Dietician) Oral care QID;Oral care before and after PO;Staff/trained caregiver to provide oral care   Frequent or constant Supervision/Assistance Dysphagia, oropharyngeal phase (R13.12) (Baseline Dementia; dependent care for feeding; Edentulous)     Continue with current plan of care        Paul Edward, MS, CCC-SLP Speech Language Pathologist Rehab Services; [facility] [phone] (ascom) Paul Vaughan  09/02/2023, 5:50 PM

## 2023-09-02 NOTE — Progress Notes (Signed)
 Progress Note   Patient: Paul Vaughan ZOX:[id number] DOB: Jul 07, 1955 DOA: 08/31/2023     2 DOS: the patient was seen and examined on 09/02/2023   Brief hospital course: [name] is a 68 year old male with history of non-insulin-dependent diabetes mellitus, hyperlipidemia, neuropathy, intellectual learning difficulty/dementia, COPD, on baseline 4 L nasal cannula, heart failure preserved ejection fraction, who presents emergency department for chief concerns of respiratory distress noticed by nursing staff.  Vitals in the ED showed temperature of 101.4, respiration rate of 25, heart rate of 130, blood pressure 104/77, SpO2 of 95% on BiPAP.  Serum sodium is 136, potassium 5.0, chloride 100, bicarb 23, BUN 36, serum creatinine 1.39, EGFR 55, nonfasting blood glucose 126, WBC 9.4, hemoglobin 12.4, platelets of 419.BNP was elevated at 486.2.  High sensitive troponin was 14.  Lactic acid was 5.7 and on repeat 1 hour later was 5.9. COVID/influenza A/influenza B/RSV PCR were negative. Blood cultures x 2 are in process.  ED treatment: DuoNebs one-time treatment, acetaminophen  1000 mg IV one-time dose, azithromycin  500 mg IV one-time dose, ceftriaxone  2 g IV one-time dose, LR 2.5 mL liter bolus were given, and vancomycin  per pharmacy.  Patient was recently admitted from 3/16 till 08/19/2023 when presented with generalized weakness, treated for pneumonia and rhabdo.  Patient did had worsening respiratory status during that admission which was stabilizes and he was discharged on 4 L of oxygen to SNF.  4/20: Afebrile with mild tachycardia, remained on BiPAP.  Patient recently treated for multifocal pneumonia.  Also noted significantly elevated CK meeting criteria for rhabdomyolysis with no history of fall.  CK improved with IV fluid, seems like having chronic intermittently elevated CK. MRSA PCR negative so discontinuing vancomycin .  CTA chest was done due to the significant hypoxia, had a small  PE and right upper lobe-started on heparin  infusion.  Did show multifocal pneumonia.  Procalcitonin significantly elevated at 15.77.  Ordered expanded respiratory panel, strep pneumo and Legionella antigen.  4/21: Vital stable but remained on BiPAP.  RVP negative, strep pneumo negative, lactic acidosis resolved.  Labs seems stable.  Patient becoming tachypneic and requiring up to 15 L of oxygen when trying to wean off from BiPAP.  PCCM was also consulted.  Echocardiogram done-pending results  Assessment and Plan: * Severe sepsis with acute organ dysfunction [facility]) Patient had fever of 101.4, increased respiration, increased heart rate, elevated lactic acid, organ involvement or pulmonary and renal.  Likely secondary to multifocal bilateral pneumonia.  Procalcitonin elevated at 15.77 and preliminary blood cultures negative in 24 hours.  MRSA PCR negative, lactic acidosis resolved.  Expanded RVP negative, strep pneumo negative Legionella pending.  Vancomycin  was discontinued when MRSA PCR was negative.  Procalcitonin improving - Continue with Zithromax  and cefepime  - Continue with supportive care  Multifocal pneumonia - Please see above  Acute pulmonary embolism (HCC) CTA was done due to acute profound hypoxia and patient with recent prolonged hospitalization and decreased mobility. Came back positive for a small upper lobe PE -Starting on heparin  infusion-will switch to Eliquis   Acute on chronic hypoxic respiratory failure (HCC) Likely multifactorial with bilateral multifocal pneumonia, CTA also shows a small right upper lobe PE,  Going back and forth with BiPAP and nasal cannula, difficult to wean as he was becoming tachypneic and still requiring up to 18 L of oxygen, desaturated with minor exertion like moving in bed -Pulmonary was also consulted -Continue with supplemental oxygen-wean to baseline as tolerated - Will try 1 dose of IV Lasix   AKI (  acute kidney injury) (HCC) Improved with IV  fluid. -Monitor renal function -Avoid nephrotoxins  Elevated LFTs Improved, likely secondary to sepsis.  Chronic diastolic CHF (congestive heart failure) (HCC) Prior echocardiogram done last year with normal EF and grade 1 diastolic dysfunction.  Elevated BNP at 483.  Clinically appears euvolemic.  Patient did receive IV fluid due to sepsis and lactic acidosis. - Repeat echocardiogram ordered-pending -Monitor volume status closely  Essential (primary) hypertension Mildly elevated blood pressure this morning, patient was not on any antihypertensives at baseline. Hydralazine  5 mg IV every 6 hours as needed for SBP > 175,  Continue to monitor  Adjustment disorder with mixed disturbance of emotions and conduct Home quetiapine  150 mg nightly, quetiapine  50 mg in the morning and in the afternoon were resumed on admission  Chronic depression Home escitalopram  5 mg nightly resumed  Hyperlipidemia, unspecified Home rosuvastatin  20 mg nightly resumed on admission Home Praluent not resumed on admission   Subjective: Patient was little tachypneic and on 15 L of oxygen when seen today.  Appetite remained poor.  Having difficulty weaning off from BiPAP due to tachypnea and hypoxia.  Denies any chest pain.  Physical Exam: Vitals:   09/02/23 1100 09/02/23 1115 09/02/23 1125 09/02/23 1130  BP: (!) 143/68     Pulse: (!) 106 (!) 110  100  Resp: (!) 25 (!) 30  (!) 23  Temp:   98.8 F (37.1 C)   TempSrc:   Axillary   SpO2: 94% 96%  94%  Weight:      Height:       General.  Malnourished gentleman, in no acute distress. Pulmonary.  Harsh breath sounds with scattered rhonchi bilaterally, mildly increased work of breathing. CV.  Regular rate and rhythm, no JVD, rub or murmur. Abdomen.  Soft, nontender, nondistended, BS positive. CNS.  Alert and oriented .  No focal neurologic deficit. Extremities.  No edema, no cyanosis, pulses intact and symmetrical.  Data Reviewed: Prior data  reviewed  Family Communication: DSS is legal guardian, tried calling the representative listed in his chart with no response  Disposition: Status is: Inpatient Remains inpatient appropriate because: Severity of illness  Planned Discharge Destination: Skilled nursing facility  DVT prophylaxis.  Heparin  infusion Time spent: 50 minutes  This record has been created using Conservation officer, historic buildings. Errors have been sought and corrected,but may not always be located. Such creation errors do not reflect on the standard of care.   Author: Luna Salinas, MD 09/02/2023 1:04 PM  For on call review [email or web address].

## 2023-09-02 NOTE — Assessment & Plan Note (Signed)
 Mildly elevated blood pressure this morning, patient was not on any antihypertensives at baseline. Hydralazine  5 mg IV every 6 hours as needed for SBP > 175,  Continue to monitor

## 2023-09-02 NOTE — Assessment & Plan Note (Addendum)
 Patient had fever of 101.4, increased respiration, increased heart rate, elevated lactic acid, organ involvement or pulmonary and renal.  Likely secondary to multifocal bilateral pneumonia.  Procalcitonin elevated at 15.77 and preliminary blood cultures negative in 24 hours.  MRSA PCR negative, lactic acidosis resolved.  Expanded RVP negative, strep pneumo negative Legionella pending.  Vancomycin  was discontinued when MRSA PCR was negative.  Procalcitonin improving Swallow evaluation is high risk for recurrent aspiration which is likely contributory to recurrent pneumonia, patient also has very weak cough and very debilitated. - Continue with Zithromax  and cefepime  - Continue with supportive care

## 2023-09-02 NOTE — Progress Notes (Signed)
 MD notified: the patient is more confused this morning. Sounds more congested then  yesterday. he had a bowel movement and we had to clean him up. I had him on high flow nasal cannula on 15 liters this morning and he would desat to 87%. He is more confused over the night he kept trying to remove the bipap. After having a bath and getting cleaned up  this morning he was placed back on the bipap as his respiration were up in the 30's. He managed to remove his bipap about 5 minutes ago and desated to 79%. Bipap was placed by on him again and mittens placed for further preventions. Her did take his meds over the night.

## 2023-09-02 NOTE — Assessment & Plan Note (Addendum)
 CTA was done due to acute profound hypoxia and patient with recent prolonged hospitalization and decreased mobility. Came back positive for a small upper lobe PE - Initial heparin  infusion has been switched with Eliquis 

## 2023-09-02 NOTE — Telephone Encounter (Signed)
 Patient Product/process development scientist completed.    The patient is insured through [employer]. Patient has Medicare and is not eligible for a copay card, but may be able to apply for patient assistance or Medicare RX Payment Plan (Patient Must reach out to their plan, if eligible for payment plan), if available.    Ran test claim for Eliquis  5 mg and the current 30 day co-pay is $12.15.   This test claim was processed through [facility]- copay amounts may vary at other pharmacies due to pharmacy/plan contracts, or as the patient moves through the different stages of their insurance plan.     Morgan Arab, CPHT Pharmacy Technician III Certified Patient Advocate [facility] Patient Advocate Team Direct Number: [phone]  Fax: [phone]

## 2023-09-02 NOTE — Assessment & Plan Note (Addendum)
 Likely multifactorial with bilateral multifocal pneumonia, CTA also shows a small right upper lobe PE, recurrent aspiration with a weak cough, overall physical and mental debility Currently on heated high flow at 50 L of oxygen, FiO2 of 65% Received 1 dose of IV Lasix  yesterday -Pulmonary was also consulted -Continue with supplemental oxygen-wean to baseline as tolerated

## 2023-09-02 NOTE — Progress Notes (Signed)
 MD notified: Dr. Ariel Begun I tried giving this patient his meds in apple sauce. He took I bite of medicine and struggle trough the process. His would cough and did get some secretions out but it was mixed with the medicine reports he is not able to swallow and the medicine does not go down is what he mentioned. His respirations increased in the 30's just by trying to swallow the apple sauce. It might be more beneficial to change his depakote  and some of his other meds to IV if possible since it seems like he cough what I tries to feed him plus he refuses the rest of his medicine and he reports he can't swallow

## 2023-09-03 DIAGNOSIS — R6520 Severe sepsis without septic shock: Secondary | ICD-10-CM | POA: Diagnosis not present

## 2023-09-03 DIAGNOSIS — J189 Pneumonia, unspecified organism: Secondary | ICD-10-CM | POA: Diagnosis not present

## 2023-09-03 DIAGNOSIS — A419 Sepsis, unspecified organism: Secondary | ICD-10-CM | POA: Diagnosis not present

## 2023-09-03 DIAGNOSIS — I2699 Other pulmonary embolism without acute cor pulmonale: Secondary | ICD-10-CM | POA: Diagnosis not present

## 2023-09-03 DIAGNOSIS — Z7189 Other specified counseling: Secondary | ICD-10-CM | POA: Diagnosis not present

## 2023-09-03 DIAGNOSIS — J9621 Acute and chronic respiratory failure with hypoxia: Secondary | ICD-10-CM | POA: Diagnosis not present

## 2023-09-03 LAB — LEGIONELLA PNEUMOPHILA SEROGP 1 UR AG: L. pneumophila Serogp 1 Ur Ag: NEGATIVE

## 2023-09-03 LAB — ECHOCARDIOGRAM COMPLETE
Height: 68 in
S' Lateral: 2.5 cm
Weight: 2631.41 [oz_av]

## 2023-09-03 LAB — GLUCOSE, CAPILLARY: Glucose-Capillary: 113 mg/dL — ABNORMAL HIGH (ref 70–99)

## 2023-09-03 LAB — BLOOD GAS, VENOUS

## 2023-09-03 MED ORDER — ACETYLCYSTEINE 20 % IN SOLN
4.0000 mL | Freq: Two times a day (BID) | RESPIRATORY_TRACT | Status: DC
  Administered 2023-09-03 – 2023-09-12 (×18): 4 mL via RESPIRATORY_TRACT
  Filled 2023-09-03 (×18): qty 4

## 2023-09-03 MED ORDER — ORAL CARE MOUTH RINSE
15.0000 mL | OROMUCOSAL | Status: DC
  Administered 2023-09-03: 15 mL via OROMUCOSAL

## 2023-09-03 MED ORDER — IPRATROPIUM-ALBUTEROL 0.5-2.5 (3) MG/3ML IN SOLN
3.0000 mL | Freq: Three times a day (TID) | RESPIRATORY_TRACT | Status: DC
  Administered 2023-09-03 – 2023-09-20 (×51): 3 mL via RESPIRATORY_TRACT
  Filled 2023-09-03 (×52): qty 3

## 2023-09-03 MED ORDER — ORAL CARE MOUTH RINSE: 15.0000 mL | OROMUCOSAL | Status: DC | PRN

## 2023-09-03 MED ORDER — SODIUM CHLORIDE 0.9 % IV SOLN
2.0000 g | INTRAVENOUS | Status: DC
  Administered 2023-09-03 – 2023-09-05 (×3): 2 g via INTRAVENOUS
  Filled 2023-09-03 (×4): qty 20

## 2023-09-03 MED ORDER — FUROSEMIDE 10 MG/ML IJ SOLN
40.0000 mg | Freq: Two times a day (BID) | INTRAMUSCULAR | Status: AC
  Administered 2023-09-03 – 2023-09-06 (×6): 40 mg via INTRAVENOUS
  Filled 2023-09-03 (×6): qty 4

## 2023-09-03 NOTE — Consult Note (Signed)
 Consultation Note Date: 09/03/2023   Patient Name: Paul Vaughan  DOB: 07-Apr-1956  MRN: [id number]  Age / Sex: 68 y.o., male  PCP: Meri Stammer, NP Referring Physician: Luna Salinas, MD  Reason for Consultation: Establishing goals of care  HPI/Patient Profile: [name] is a 68 year old male with history of non-insulin-dependent diabetes mellitus, hyperlipidemia, neuropathy, intellectual learning difficulty/dementia, COPD, on baseline 4 L nasal cannula, heart failure preserved ejection fraction, who presents emergency department for chief concerns of respiratory distress noticed by nursing staff.   Clinical Assessment and Goals of Care: Notes and labs reviewed for this admission.  Notes reviewed from previous admissions, including prior [facility] notes.  In to see patient.  He is currently sitting in ICU bed with high flow cannula in place.  Work of breathing noted, but patient denies feeling of shortness of breath.  He is able to tell me his name and that he is at the hospital.  He states he is thirsty and would like water; nurse who is at bedside gives him nectar thick water and patient states "this is not water".  He sounds congested.  With further conversation he stops answering questions and appears to go to sleep.  Attempted to reach out to DSS unsuccessfully at the number listed in demographics.  Voicemail message did not match the name of the person listed as legal guardian in demographics.    SUMMARY OF RECOMMENDATIONS   PMT will follow  Prognosis:  Poor overall      Primary Diagnoses: Present on Admission:  Severe sepsis with acute organ dysfunction (HCC)  Multifocal pneumonia  Hyperlipidemia, unspecified  Elevated LFTs  Essential (primary) hypertension  Chronic depression  Chronic diastolic CHF (congestive heart failure) (HCC)  Adjustment disorder with mixed  disturbance of emotions and conduct  AKI (acute kidney injury) (HCC)   I have reviewed the medical record, interviewed the patient and family, and examined the patient. The following aspects are pertinent.  Past Medical History:  Diagnosis Date   CHF (congestive heart failure) (HCC)    Chronic kidney disease    COPD (chronic obstructive pulmonary disease) (HCC)    Dementia (HCC)    Social History   Socioeconomic History   Marital status: Single    Spouse name: Not on file   Number of children: Not on file   Years of education: Not on file   Highest education level: Not on file  Occupational History   Not on file  Tobacco Use   Smoking status: Every Day    Current packs/day: 1.00    Types: Cigarettes   Smokeless tobacco: Never  Substance and Sexual Activity   Alcohol use: No   Drug use: No   Sexual activity: Not Currently  Other Topics Concern   Not on file  Social History Narrative   Not on file   Social Drivers of Health   Financial Resource Strain: Not on file  Food Insecurity: Patient Unable To Answer (08/31/2023)   Hunger Vital Sign  Worried About Programme researcher, broadcasting/film/video in the Last Year: Patient unable to answer    Ran Out of Food in the Last Year: Patient unable to answer  Transportation Needs: Patient Unable To Answer (08/31/2023)   PRAPARE - Transportation    Lack of Transportation (Medical): Patient unable to answer    Lack of Transportation (Non-Medical): Patient unable to answer  Physical Activity: Not on file  Stress: Not on file  Social Connections: Patient Unable To Answer (08/31/2023)   Social Connection and Isolation Panel [NHANES]    Frequency of Communication with Friends and Family: Patient unable to answer    Frequency of Social Gatherings with Friends and Family: Patient unable to answer    Attends Religious Services: Patient unable to answer    Active Member of Clubs or Organizations: Patient unable to answer    Attends Banker  Meetings: Patient unable to answer    Marital Status: Patient unable to answer   Family History  Family history unknown: Yes   Scheduled Meds:  apixaban   10 mg Oral BID   Followed by   Cecily Cohen ON 09/09/2023] apixaban   5 mg Oral BID   aspirin  EC  81 mg Oral q AM   Chlorhexidine  Gluconate Cloth  6 each Topical Q1400   divalproex   500 mg Oral Daily   divalproex   500 mg Oral QHS   escitalopram   5 mg Oral QHS   feeding supplement  237 mL Oral TID BM   folic acid   1 mg Oral q AM   gabapentin   100 mg Oral TID   ipratropium-albuterol   3 mL Nebulization TID   multivitamin with minerals  1 tablet Oral Daily   nystatin   5 mL Oral QID   pyridOXINE   100 mg Oral QHS   QUEtiapine   100 mg Oral QHS   QUEtiapine   50 mg Oral 2 times per day   rosuvastatin   20 mg Oral QHS   thiamine   100 mg Oral q AM   Continuous Infusions:  azithromycin  500 mg (09/03/23 1410)   ceFEPime  (MAXIPIME ) IV 2 g (09/03/23 1011)   PRN Meds:.acetaminophen  **OR** acetaminophen , liver oil-zinc  oxide, ondansetron  **OR** ondansetron  (ZOFRAN ) IV, mouth rinse, mouth rinse Medications Prior to Admission:  Prior to Admission medications   Medication Sig Start Date End Date Taking? Authorizing Provider  albuterol  (PROVENTIL ) (2.5 MG/3ML) 0.083% nebulizer solution Take 2.5 mg by nebulization QID.   Yes [provider]  albuterol  (VENTOLIN  HFA) 108 (90 Base) MCG/ACT inhaler Inhale 2 puffs into the lungs every 6 (six) hours as needed for wheezing or shortness of breath.   Yes [provider]  aspirin  EC 81 MG tablet Take 81 mg by mouth in the morning. Swallow whole.   Yes [provider]  divalproex  (DEPAKOTE  ER) 500 MG 24 hr tablet 1 tablet (500 mg) every morning and 2 tablets (1000 mg) at bedtime 08/19/23  Yes Alexander, Natalie, DO  escitalopram  (LEXAPRO ) 5 MG tablet Take 5 mg by mouth at bedtime. 06/05/21  Yes [provider]  ferrous sulfate ER (IRON SLOW RELEASE) 142 (45 Fe) MG TBCR tablet Take 45  mg by mouth in the morning.   Yes [provider]  folic acid  (FOLVITE ) 1 MG tablet Take 1 mg by mouth in the morning.   Yes [provider]  gabapentin  (NEURONTIN ) 100 MG capsule Take 100 mg by mouth 3 (three) times daily. 06/05/21  Yes [provider]  JARDIANCE  10 MG TABS tablet Take 10 mg  by mouth in the morning.   Yes [provider]  Multiple Vitamin (MULTIVITAMIN WITH MINERALS) TABS tablet Take 1 tablet by mouth daily. 08/19/23  Yes Alexander, Natalie, DO  naloxone [facility]) nasal spray 4 mg/0.1 mL Place 1 spray into the nose once.   Yes [provider]  ondansetron  (ZOFRAN ) 4 MG tablet Take 1 tablet (4 mg total) by mouth every 6 (six) hours as needed for nausea. 08/19/23  Yes Alexander, Natalie, DO  polyethylene glycol (MIRALAX  / GLYCOLAX ) 17 g packet Take 17 g by mouth daily as needed for mild constipation. 08/19/23  Yes Alexander, Natalie, DO  PRALUENT 150 MG/ML SOAJ Inject 150 mg into the skin as directed. Every 2 weeks in the morning   Yes [provider]  pyridOXINE  (VITAMIN B-6) 100 MG tablet Take 100 mg by mouth at bedtime.   Yes [provider]  QUEtiapine  (SEROQUEL ) 100 MG tablet Take 150 mg by mouth at bedtime. 06/05/21  Yes [provider]  QUEtiapine  (SEROQUEL ) 50 MG tablet Take 50 mg by mouth 2 (two) times daily. 50 mg in the morning (2) 50 mg at 2 pm 06/05/21  Yes [provider]  rosuvastatin  (CRESTOR ) 20 MG tablet Take 20 mg by mouth at bedtime. 06/05/21  Yes [provider]  thiamine  100 MG tablet Take 100 mg by mouth in the morning.   Yes [provider]  feeding supplement (ENSURE ENLIVE / ENSURE PLUS) LIQD Take 237 mLs by mouth 3 (three) times daily between meals. 08/19/23   Alexander, Natalie, DO   Allergies  Allergen Reactions   Penicillins Rash and Swelling    .Has patient had a PCN reaction causing immediate rash, facial/tongue/throat swelling, SOB or lightheadedness with  hypotension: Unknown Has patient had a PCN reaction causing severe rash involving mucus membranes or skin necrosis: Unknown Has patient had a PCN reaction that required hospitalization: Unknown Has patient had a PCN reaction occurring within the last 10 years: Unknown If all of the above answers are "NO", then may proceed with Cephalosporin use.    Sulfa Antibiotics Rash and Swelling   Review of Systems  All other systems reviewed and are negative.   Physical Exam Pulmonary:     Comments: High flow cannula.  Work of breathing noted. Neurological:     Mental Status: He is alert.     Vital Signs: BP (!) 148/64   Pulse 99   Temp 99.4 F (37.4 C) (Axillary)   Resp (!) 29   Ht 5\' 8"  (1.727 m)   Wt 74.6 kg   SpO2 93%   BMI 25.01 kg/m  Pain Scale: 0-10   Pain Score: 0-No pain   SpO2: SpO2: 93 % O2 Device:SpO2: 93 % O2 Flow Rate: .O2 Flow Rate (L/min): 50 L/min  IO: Intake/output summary:  Intake/Output Summary (Last 24 hours) at 09/03/2023 1538 Last data filed at 09/03/2023 0250 Gross per 24 hour  Intake 250 ml  Output 270 ml  Net -20 ml    LBM: Last BM Date : 09/01/23 Baseline Weight: Weight: 77 kg Most recent weight: Weight: 74.6 kg       Signed by: Meribeth Standard, NP   Please contact Palliative Medicine Team phone at [phone] for questions and concerns.  For individual provider: See Cloyde Daring.

## 2023-09-03 NOTE — Progress Notes (Signed)
 PULMONOLOGY         Date: 09/03/2023,   MRN# [id number] Paul Vaughan [birth year]     AdmissionWeight: 77 kg                 CurrentWeight: 74.6 kg  Referring provider: Dr Ariel Begun   CHIEF COMPLAINT:    Sepsis with shock   HISTORY OF PRESENT ILLNESS   68 yo M with hx of DM, COPD, dyslipidemia, chronic hypoxemia, cognitive impairment, CHF came in febrile hypoxemic requiring BIPAP. He had viral workup done which was negative, he had blood cultures done which are negative x2d.  Cardiac biomarkers have been elevated. Patient has care giver from DSS and I met with [name] to review care plan. He had speech/swallow evaluation with goal to use thickened pureed nourishment.    PAST MEDICAL HISTORY   Past Medical History:  Diagnosis Date   CHF (congestive heart failure) (HCC)    Chronic kidney disease    COPD (chronic obstructive pulmonary disease) (HCC)    Dementia (HCC)      SURGICAL HISTORY   History reviewed. No pertinent surgical history.   FAMILY HISTORY   Family History  Family history unknown: Yes     SOCIAL HISTORY   Social History   Tobacco Use   Smoking status: Every Day    Current packs/day: 1.00    Types: Cigarettes   Smokeless tobacco: Never  Substance Use Topics   Alcohol use: No   Drug use: No     MEDICATIONS    Home Medication:    Current Medication:  Current Facility-Administered Medications:    acetaminophen  (TYLENOL ) tablet 650 mg, 650 mg, Oral, Q6H PRN, 650 mg at 09/02/23 1726 **OR** acetaminophen  (TYLENOL ) suppository 650 mg, 650 mg, Rectal, Q6H PRN, Cox, Amy N, DO   acetylcysteine  (MUCOMYST ) 20 % nebulizer / oral solution 4 mL, 4 mL, Nebulization, BID, Adalai Perl, MD   apixaban  (ELIQUIS ) tablet 10 mg, 10 mg, Oral, BID, 10 mg at 09/03/23 1012 **FOLLOWED BY** [START ON 09/09/2023] apixaban  (ELIQUIS ) tablet 5 mg, 5 mg, Oral, BID, Amin, Sumayya, MD   aspirin  EC tablet 81 mg, 81 mg, Oral, q AM, Cox, Amy N, DO, 81  mg at 09/03/23 0604   azithromycin  (ZITHROMAX ) 500 mg in sodium chloride  0.9 % 250 mL IVPB, 500 mg, Intravenous, Q24H, Cox, Amy N, DO, Last Rate: 250 mL/hr at 09/03/23 1410, 500 mg at 09/03/23 1410   Chlorhexidine  Gluconate Cloth 2 % PADS 6 each, 6 each, Topical, Q1400, Cox, Amy N, DO, 6 each at 09/03/23 1415   divalproex  (DEPAKOTE  ER) 24 hr tablet 500 mg, 500 mg, Oral, Daily, Cox, Amy N, DO, 500 mg at 09/03/23 1012   divalproex  (DEPAKOTE  ER) 24 hr tablet 500 mg, 500 mg, Oral, QHS, Ramonita Burow, RPH, 500 mg at 09/02/23 2125   escitalopram  (LEXAPRO ) tablet 5 mg, 5 mg, Oral, QHS, Cox, Amy N, DO, 5 mg at 09/02/23 2130   feeding supplement (ENSURE ENLIVE / ENSURE PLUS) liquid 237 mL, 237 mL, Oral, TID BM, Cox, Amy N, DO, 237 mL at 09/03/23 1415   folic acid  (FOLVITE ) tablet 1 mg, 1 mg, Oral, q AM, Cox, Amy N, DO, 1 mg at 09/03/23 0604   furosemide  (LASIX ) injection 40 mg, 40 mg, Intravenous, BID, Fonda Rochon, MD   gabapentin  (NEURONTIN ) capsule 100 mg, 100 mg, Oral, TID, Cox, Amy N, DO, 100 mg at 09/03/23 1614   ipratropium-albuterol  (DUONEB) 0.5-2.5 (3) MG/3ML nebulizer solution 3  mL, 3 mL, Nebulization, TID, Amin, Sumayya, MD, 3 mL at 09/03/23 1335   liver oil-zinc  oxide (DESITIN) 40 % ointment, , Topical, PRN, Amin, Sumayya, MD, 1 Application at 09/01/23 1754   multivitamin with minerals tablet 1 tablet, 1 tablet, Oral, Daily, Cox, Amy N, DO, 1 tablet at 09/03/23 1013   nystatin  (MYCOSTATIN ) 100000 UNIT/ML suspension 500,000 Units, 5 mL, Oral, QID, Amin, Sumayya, MD, 500,000 Units at 09/03/23 1416   ondansetron  (ZOFRAN ) tablet 4 mg, 4 mg, Oral, Q6H PRN **OR** ondansetron  (ZOFRAN ) injection 4 mg, 4 mg, Intravenous, Q6H PRN, Cox, Amy N, DO   Oral care mouth rinse, 15 mL, Mouth Rinse, PRN, Luna Salinas, MD   Oral care mouth rinse, 15 mL, Mouth Rinse, PRN, [place], Jenni Mody L, NP   pyridOXINE  (VITAMIN B6) tablet 100 mg, 100 mg, Oral, QHS, Cox, Amy N, DO, 100 mg at 09/02/23 2139    QUEtiapine  (SEROQUEL ) tablet 100 mg, 100 mg, Oral, QHS, [place], Phoebe Breed, NP, 100 mg at 09/02/23 2129   QUEtiapine  (SEROQUEL ) tablet 50 mg, 50 mg, Oral, 2 times per day, Cox, Amy N, DO, 50 mg at 09/03/23 1415   rosuvastatin  (CRESTOR ) tablet 20 mg, 20 mg, Oral, QHS, Cox, Amy N, DO, 20 mg at 09/02/23 2124   thiamine  (VITAMIN B1) tablet 100 mg, 100 mg, Oral, q AM, Cox, Amy N, DO, 100 mg at 09/03/23 0604    ALLERGIES   Penicillins and Sulfa antibiotics     REVIEW OF SYSTEMS    Review of Systems:  Gen:  Denies  fever, sweats, chills weigh loss  HEENT: Denies blurred vision, double vision, ear pain, eye pain, hearing loss, nose bleeds, sore throat Cardiac:  No dizziness, chest pain or heaviness, chest tightness,edema Resp:   reports dyspnea chronically  Gi: Denies swallowing difficulty, stomach pain, nausea or vomiting, diarrhea, constipation, bowel incontinence Gu:  Denies bladder incontinence, burning urine Ext:   Denies Joint pain, stiffness or swelling Skin: Denies  skin rash, easy bruising or bleeding or hives Endoc:  Denies polyuria, polydipsia , polyphagia or weight change Psych:   Denies depression, insomnia or hallucinations   Other:  All other systems negative   VS: BP (!) 148/64   Pulse 99   Temp 99.4 F (37.4 C) (Axillary)   Resp (!) 29   Ht 5\' 8"  (1.727 m)   Wt 74.6 kg   SpO2 93%   BMI 25.01 kg/m      PHYSICAL EXAM    GENERAL:NAD, no fevers, chills, no weakness no fatigue HEAD: Normocephalic, atraumatic.  EYES: Pupils equal, round, reactive to light. Extraocular muscles intact. No scleral icterus.  MOUTH: Moist mucosal membrane. Dentition intact. No abscess noted.  EAR, NOSE, THROAT: Clear without exudates. No external lesions.  NECK: Supple. No thyromegaly. No nodules. No JVD.  PULMONARY: decreased breath sounds with mild rhonchi worse at bases bilaterally.  CARDIOVASCULAR: S1 and S2. Regular rate and rhythm. No murmurs, rubs, or gallops. No  edema. Pedal pulses 2+ bilaterally.  GASTROINTESTINAL: Soft, nontender, nondistended. No masses. Positive bowel sounds. No hepatosplenomegaly.  MUSCULOSKELETAL: No swelling, clubbing, or edema. Range of motion full in all extremities.  NEUROLOGIC: Cranial nerves II through XII are intact. No gross focal neurological deficits. Sensation intact. Reflexes intact.  SKIN: No ulceration, lesions, rashes, or cyanosis. Skin warm and dry. Turgor intact.  PSYCHIATRIC: Mood, affect within normal limits. The patient is awake, alert and oriented x 3. Insight, judgment intact.       IMAGING   Reviewed CT  chest - 09/01/23- multifocal pneumonia and PE  ASSESSMENT/PLAN   Acute hypoxemic respiratory failure     - continue antbitics- patient with multifocal pneumonia - dc cefepime , continue zithromax  start rocephin     Pulmonary embolism     - continue anticoagulation    Pulmonary edema and pleural effusion      - lasix  40 bid    Bibasilar atelectasis     - bed chest pt due to cognitive impairment    - VEST therapy      Bilateral mucus plugging     - mucomyst  20 % 4ml BID       Critical care provider statement:   Total critical care time: 33 minutes   Performed by: Jaclynn Mast MD   Critical care time was exclusive of separately billable procedures and treating other patients.   Critical care was necessary to treat or prevent imminent or life-threatening deterioration.   Critical care was time spent personally by me on the following activities: development of treatment plan with patient and/or surrogate as well as nursing, discussions with consultants, evaluation of patient's response to treatment, examination of patient, obtaining history from patient or surrogate, ordering and performing treatments and interventions, ordering and review of laboratory studies, ordering and review of radiographic studies, pulse oximetry and re-evaluation of patient's condition.    Tanith Dagostino, M.D.   Pulmonary & Critical Care Medicine         Erskin Hearing, M.D.  Division of Pulmonary & Critical Care Medicine

## 2023-09-03 NOTE — Progress Notes (Signed)
 Progress Note   Patient: Paul Vaughan WGN:[id number] DOB: 1955/05/16 DOA: 08/31/2023     3 DOS: the patient was seen and examined on 09/03/2023   Brief hospital course: [name] is a 68 year old male with history of non-insulin-dependent diabetes mellitus, hyperlipidemia, neuropathy, intellectual learning difficulty/dementia, COPD, on baseline 4 L nasal cannula, heart failure preserved ejection fraction, who presents emergency department for chief concerns of respiratory distress noticed by nursing staff.  Vitals in the ED showed temperature of 101.4, respiration rate of 25, heart rate of 130, blood pressure 104/77, SpO2 of 95% on BiPAP.  Serum sodium is 136, potassium 5.0, chloride 100, bicarb 23, BUN 36, serum creatinine 1.39, EGFR 55, nonfasting blood glucose 126, WBC 9.4, hemoglobin 12.4, platelets of 419.BNP was elevated at 486.2.  High sensitive troponin was 14.  Lactic acid was 5.7 and on repeat 1 hour later was 5.9. COVID/influenza A/influenza B/RSV PCR were negative. Blood cultures x 2 are in process.  ED treatment: DuoNebs one-time treatment, acetaminophen  1000 mg IV one-time dose, azithromycin  500 mg IV one-time dose, ceftriaxone  2 g IV one-time dose, LR 2.5 mL liter bolus were given, and vancomycin  per pharmacy.  Patient was recently admitted from 3/16 till 08/19/2023 when presented with generalized weakness, treated for pneumonia and rhabdo.  Patient did had worsening respiratory status during that admission which was stabilizes and he was discharged on 4 L of oxygen to SNF.  4/20: Afebrile with mild tachycardia, remained on BiPAP.  Patient recently treated for multifocal pneumonia.  Also noted significantly elevated CK meeting criteria for rhabdomyolysis with no history of fall.  CK improved with IV fluid, seems like having chronic intermittently elevated CK. MRSA PCR negative so discontinuing vancomycin .  CTA chest was done due to the significant hypoxia, had a small  PE and right upper lobe-started on heparin  infusion.  Did show multifocal pneumonia.  Procalcitonin significantly elevated at 15.77.  Ordered expanded respiratory panel, strep pneumo and Legionella antigen.  4/21: Vital stable but remained on BiPAP.  RVP negative, strep pneumo negative, lactic acidosis resolved.  Labs seems stable.  Patient becoming tachypneic and requiring up to 15 L of oxygen when trying to wean off from BiPAP.  PCCM was also consulted.    4/22: Patient remained little tachypneic and requiring heated high flow at 50 L of oxygen, FiO2 of 65%, VBG with significant hypoxia.  Swallow evaluation with high risk for aspiration, had a very weak cough.  He was placed on dysphagia diet with nectar thick liquid. Palliative care was also consulted.  Assessment and Plan: * Severe sepsis with acute organ dysfunction [facility]) Patient had fever of 101.4, increased respiration, increased heart rate, elevated lactic acid, organ involvement or pulmonary and renal.  Likely secondary to multifocal bilateral pneumonia.  Procalcitonin elevated at 15.77 and preliminary blood cultures negative in 24 hours.  MRSA PCR negative, lactic acidosis resolved.  Expanded RVP negative, strep pneumo negative Legionella pending.  Vancomycin  was discontinued when MRSA PCR was negative.  Procalcitonin improving Swallow evaluation is high risk for recurrent aspiration which is likely contributory to recurrent pneumonia, patient also has very weak cough and very debilitated. - Continue with Zithromax  and cefepime  - Continue with supportive care  Multifocal pneumonia - Please see above  Acute pulmonary embolism (HCC) CTA was done due to acute profound hypoxia and patient with recent prolonged hospitalization and decreased mobility. Came back positive for a small upper lobe PE - Initial heparin  infusion has been switched with Eliquis   Acute on chronic  hypoxic respiratory failure (HCC) Likely multifactorial with  bilateral multifocal pneumonia, CTA also shows a small right upper lobe PE, recurrent aspiration with a weak cough, overall physical and mental debility Currently on heated high flow at 50 L of oxygen, FiO2 of 65% Received 1 dose of IV Lasix  yesterday -Pulmonary was also consulted -Continue with supplemental oxygen-wean to baseline as tolerated  AKI (acute kidney injury) (HCC) Improved with IV fluid. -Monitor renal function -Avoid nephrotoxins  Elevated LFTs Improved, likely secondary to sepsis.  Chronic diastolic CHF (congestive heart failure) (HCC) Prior echocardiogram done last year with normal EF and grade 1 diastolic dysfunction.  Elevated BNP at 483.  Clinically appears euvolemic.  Patient did receive IV fluid due to sepsis and lactic acidosis. - Repeat echocardiogram ordered-pending -Monitor volume status closely  Essential (primary) hypertension Mildly elevated blood pressure this morning, patient was not on any antihypertensives at baseline. Hydralazine  5 mg IV every 6 hours as needed for SBP > 175,  Continue to monitor  Adjustment disorder with mixed disturbance of emotions and conduct Home quetiapine  150 mg nightly, quetiapine  50 mg in the morning and in the afternoon were resumed on admission  Chronic depression Home escitalopram  5 mg nightly resumed  Hyperlipidemia, unspecified Home rosuvastatin  20 mg nightly resumed on admission Home Praluent not resumed on admission   Subjective: Patient remained mildly tachypneic, on 50 L of oxygen.  Physical Exam: Vitals:   09/03/23 0600 09/03/23 0808 09/03/23 0823 09/03/23 0900  BP: (!) 117/59     Pulse: 96 93    Resp: (!) 31 (!) 33    Temp:    99.7 F (37.6 C)  TempSrc:    Axillary  SpO2: 96% 94% 94%   Weight:      Height:       General.  Cognitively impaired, malnourished gentleman, in no acute distress. Pulmonary.  Harsh breath sounds bilaterally with mildly increased work of breathing CV.  Regular rate and  rhythm, no JVD, rub or murmur. Abdomen.  Soft, nontender, nondistended, BS positive. CNS.  Resting, no new focal deficit Extremities.  No edema, no cyanosis, pulses intact and symmetrical.  Data Reviewed: Prior data reviewed  Family Communication: DSS is legal guardian, PCCM already discussed with legal guardian today.  Disposition: Status is: Inpatient Remains inpatient appropriate because: Severity of illness  Planned Discharge Destination: Skilled nursing facility  DVT prophylaxis.  Heparin  infusion Time spent: 50 minutes  This record has been created using Conservation officer, historic buildings. Errors have been sought and corrected,but may not always be located. Such creation errors do not reflect on the standard of care.   Author: Luna Salinas, MD 09/03/2023 1:04 PM  For on call review [email or web address].

## 2023-09-04 ENCOUNTER — Inpatient Hospital Stay

## 2023-09-04 DIAGNOSIS — Z7189 Other specified counseling: Secondary | ICD-10-CM | POA: Diagnosis not present

## 2023-09-04 DIAGNOSIS — A419 Sepsis, unspecified organism: Secondary | ICD-10-CM | POA: Diagnosis not present

## 2023-09-04 DIAGNOSIS — R6520 Severe sepsis without septic shock: Secondary | ICD-10-CM | POA: Diagnosis not present

## 2023-09-04 LAB — BLOOD GAS, ARTERIAL
Acid-Base Excess: 9.9 mmol/L — ABNORMAL HIGH (ref 0.0–2.0)
Bicarbonate: 36.8 mmol/L — ABNORMAL HIGH (ref 20.0–28.0)
FIO2: 100 %
MECHVT: 500 mL
Mechanical Rate: 18
O2 Saturation: 99.7 %
PEEP: 10 cmH2O
Patient temperature: 37
pCO2 arterial: 58 mmHg — ABNORMAL HIGH (ref 32–48)
pH, Arterial: 7.41 (ref 7.35–7.45)
pO2, Arterial: 210 mmHg — ABNORMAL HIGH (ref 83–108)

## 2023-09-04 LAB — BASIC METABOLIC PANEL WITH GFR
Anion gap: 8 (ref 5–15)
BUN: 49 mg/dL — ABNORMAL HIGH (ref 8–23)
CO2: 29 mmol/L (ref 22–32)
Calcium: 9 mg/dL (ref 8.9–10.3)
Chloride: 103 mmol/L (ref 98–111)
Creatinine, Ser: 1.06 mg/dL (ref 0.61–1.24)
GFR, Estimated: >60 mL/min (ref >60)
Glucose, Bld: 120 mg/dL — ABNORMAL HIGH (ref 70–99)
Potassium: 3.1 mmol/L — ABNORMAL LOW (ref 3.5–5.1)
Sodium: 140 mmol/L (ref 135–145)

## 2023-09-04 LAB — MAGNESIUM: Magnesium: 2.3 mg/dL (ref 1.7–2.4)

## 2023-09-04 LAB — APTT: aPTT: >200 s (ref 24–36)

## 2023-09-04 LAB — CBC
HCT: 28.3 % — ABNORMAL LOW (ref 39.0–52.0)
Hemoglobin: 9.6 g/dL — ABNORMAL LOW (ref 13.0–17.0)
MCH: 32.8 pg (ref 26.0–34.0)
MCHC: 33.9 g/dL (ref 30.0–36.0)
MCV: 96.6 fL (ref 80.0–100.0)
Platelets: 188 10*3/uL (ref 150–400)
RBC: 2.93 MIL/uL — ABNORMAL LOW (ref 4.22–5.81)
RDW: 15.6 % — ABNORMAL HIGH (ref 11.5–15.5)
WBC: 10.3 10*3/uL (ref 4.0–10.5)
nRBC: 0 % (ref 0.0–0.2)

## 2023-09-04 LAB — GLUCOSE, CAPILLARY: Glucose-Capillary: 109 mg/dL — ABNORMAL HIGH (ref 70–99)

## 2023-09-04 LAB — POTASSIUM: Potassium: 2.9 mmol/L — ABNORMAL LOW (ref 3.5–5.1)

## 2023-09-04 LAB — PHOSPHORUS: Phosphorus: 4 mg/dL (ref 2.5–4.6)

## 2023-09-04 MED ORDER — POTASSIUM CHLORIDE 10 MEQ/100ML IV SOLN
10.0000 meq | INTRAVENOUS | Status: AC
  Administered 2023-09-04 (×3): 10 meq via INTRAVENOUS
  Filled 2023-09-04 (×3): qty 100

## 2023-09-04 MED ORDER — MIDAZOLAM HCL 2 MG/2ML IJ SOLN
1.0000 mg | INTRAMUSCULAR | Status: DC | PRN
  Administered 2023-09-05 – 2023-09-08 (×6): 2 mg via INTRAVENOUS
  Filled 2023-09-04 (×7): qty 2

## 2023-09-04 MED ORDER — METRONIDAZOLE 500 MG/100ML IV SOLN
500.0000 mg | Freq: Two times a day (BID) | INTRAVENOUS | Status: DC
  Administered 2023-09-04 – 2023-09-06 (×4): 500 mg via INTRAVENOUS
  Filled 2023-09-04 (×5): qty 100

## 2023-09-04 MED ORDER — LACTATED RINGERS IV BOLUS
500.0000 mL | Freq: Once | INTRAVENOUS | Status: AC
  Administered 2023-09-04: 500 mL via INTRAVENOUS

## 2023-09-04 MED ORDER — FENTANYL CITRATE (PF) 100 MCG/2ML IJ SOLN
150.0000 ug | Freq: Once | INTRAMUSCULAR | Status: AC
  Administered 2023-09-04: 150 ug via INTRAVENOUS

## 2023-09-04 MED ORDER — DOCUSATE SODIUM 50 MG/5ML PO LIQD
100.0000 mg | Freq: Two times a day (BID) | ORAL | Status: DC
  Administered 2023-09-04 – 2023-09-15 (×10): 100 mg
  Filled 2023-09-04 (×12): qty 10

## 2023-09-04 MED ORDER — FENTANYL 2500MCG IN NS 250ML (10MCG/ML) PREMIX INFUSION
0.0000 ug/h | INTRAVENOUS | Status: DC
Start: 2023-09-04 — End: 2023-09-12
  Administered 2023-09-04: 50 ug/h via INTRAVENOUS
  Administered 2023-09-05 – 2023-09-10 (×5): 100 ug/h via INTRAVENOUS
  Administered 2023-09-11: 175 ug/h via INTRAVENOUS
  Administered 2023-09-11: 150 ug/h via INTRAVENOUS
  Filled 2023-09-04 (×9): qty 250

## 2023-09-04 MED ORDER — POTASSIUM CHLORIDE 20 MEQ PO PACK
40.0000 meq | PACK | Freq: Once | ORAL | Status: AC
  Administered 2023-09-04: 40 meq
  Filled 2023-09-04: qty 2

## 2023-09-04 MED ORDER — NOREPINEPHRINE 4 MG/250ML-% IV SOLN
0.0000 ug/min | INTRAVENOUS | Status: DC
  Administered 2023-09-04: 2 ug/min via INTRAVENOUS
  Administered 2023-09-05 (×2): 3 ug/min via INTRAVENOUS
  Administered 2023-09-06: 2 ug/min via INTRAVENOUS
  Administered 2023-09-08: 8 ug/min via INTRAVENOUS
  Administered 2023-09-08: 9 ug/min via INTRAVENOUS
  Administered 2023-09-09: 5 ug/min via INTRAVENOUS
  Administered 2023-09-09: 8 ug/min via INTRAVENOUS
  Filled 2023-09-04 (×8): qty 250

## 2023-09-04 MED ORDER — FAMOTIDINE 20 MG PO TABS
20.0000 mg | ORAL_TABLET | Freq: Two times a day (BID) | ORAL | Status: DC
  Administered 2023-09-04 – 2023-09-05 (×2): 20 mg
  Filled 2023-09-04 (×2): qty 1

## 2023-09-04 MED ORDER — HEPARIN (PORCINE) 25000 UT/250ML-% IV SOLN
1400.0000 [IU]/h | INTRAVENOUS | Status: DC
  Administered 2023-09-04: 1400 [IU]/h via INTRAVENOUS
  Filled 2023-09-04: qty 250

## 2023-09-04 MED ORDER — ROCURONIUM BROMIDE 10 MG/ML (PF) SYRINGE
50.0000 mg | PREFILLED_SYRINGE | Freq: Once | INTRAVENOUS | Status: AC
  Administered 2023-09-04: 50 mg via INTRAVENOUS

## 2023-09-04 MED ORDER — HEPARIN (PORCINE) 25000 UT/250ML-% IV SOLN
1100.0000 [IU]/h | INTRAVENOUS | Status: DC
  Administered 2023-09-05: 1100 [IU]/h via INTRAVENOUS
  Filled 2023-09-04: qty 250

## 2023-09-04 MED ORDER — FENTANYL BOLUS VIA INFUSION
25.0000 ug | INTRAVENOUS | Status: DC | PRN
  Administered 2023-09-05 – 2023-09-06 (×4): 100 ug via INTRAVENOUS
  Administered 2023-09-06: 50 ug via INTRAVENOUS
  Administered 2023-09-07: 100 ug via INTRAVENOUS
  Administered 2023-09-08 – 2023-09-10 (×4): 50 ug via INTRAVENOUS
  Administered 2023-09-10: 100 ug via INTRAVENOUS
  Administered 2023-09-11: 50 ug via INTRAVENOUS
  Administered 2023-09-12 (×2): 100 ug via INTRAVENOUS
  Administered 2023-09-12: 50 ug via INTRAVENOUS

## 2023-09-04 MED ORDER — FENTANYL CITRATE PF 50 MCG/ML IJ SOSY
PREFILLED_SYRINGE | INTRAMUSCULAR | Status: AC
  Administered 2023-09-04: 150 ug
  Filled 2023-09-04: qty 3

## 2023-09-04 MED ORDER — ROCURONIUM BROMIDE 10 MG/ML (PF) SYRINGE
PREFILLED_SYRINGE | INTRAVENOUS | Status: AC
  Filled 2023-09-04: qty 10

## 2023-09-04 MED ORDER — MIDAZOLAM HCL 2 MG/2ML IJ SOLN
2.0000 mg | Freq: Once | INTRAMUSCULAR | Status: AC
  Administered 2023-09-04: 2 mg via INTRAVENOUS

## 2023-09-04 MED ORDER — POLYETHYLENE GLYCOL 3350 17 G PO PACK
17.0000 g | PACK | Freq: Every day | ORAL | Status: DC
  Administered 2023-09-04 – 2023-09-15 (×7): 17 g
  Filled 2023-09-04 (×8): qty 1

## 2023-09-04 MED ORDER — MIDAZOLAM HCL 2 MG/2ML IJ SOLN
INTRAMUSCULAR | Status: AC
Start: 2023-09-04 — End: 2023-09-04
  Administered 2023-09-04: 2 mg
  Filled 2023-09-04: qty 2

## 2023-09-04 MED ORDER — ORAL CARE MOUTH RINSE: 15.0000 mL | OROMUCOSAL | Status: DC | PRN

## 2023-09-04 MED ORDER — SODIUM CHLORIDE 0.9 % IV SOLN
250.0000 mL | INTRAVENOUS | Status: AC
  Administered 2023-09-04: 250 mL via INTRAVENOUS

## 2023-09-04 MED ORDER — POTASSIUM CHLORIDE 10 MEQ/100ML IV SOLN
10.0000 meq | INTRAVENOUS | Status: AC
  Administered 2023-09-04 – 2023-09-05 (×4): 10 meq via INTRAVENOUS
  Filled 2023-09-04 (×4): qty 100

## 2023-09-04 MED ORDER — ORAL CARE MOUTH RINSE: 15.0000 mL | OROMUCOSAL | Status: DC

## 2023-09-04 MED ORDER — ORAL CARE MOUTH RINSE
15.0000 mL | OROMUCOSAL | Status: DC
Start: 2023-09-04 — End: 2023-09-12
  Administered 2023-09-04 – 2023-09-12 (×94): 15 mL via OROMUCOSAL

## 2023-09-04 MED ORDER — PANTOPRAZOLE SODIUM 40 MG IV SOLR
40.0000 mg | Freq: Every day | INTRAVENOUS | Status: DC
  Administered 2023-09-04: 40 mg via INTRAVENOUS
  Filled 2023-09-04: qty 10

## 2023-09-04 NOTE — Progress Notes (Signed)
 PHARMACY CONSULT NOTE - FOLLOW UP  Pharmacy Consult for Electrolyte Monitoring and Replacement   Recent Labs: Potassium (mmol/L)  Date Value  09/04/2023 2.9 (L)   Magnesium (mg/dL)  Date Value  91/47/8295 2.3   Calcium  (mg/dL)  Date Value  62/13/0865 9.0   Albumin  (g/dL)  Date Value  78/46/9629 2.6 (L)   Phosphorus (mg/dL)  Date Value  52/84/1324 4.0   Sodium (mmol/L)  Date Value  09/04/2023 140     Assessment: 4/23:  K @ 2136 = 2.9  Goal of Therapy:  Electrolytes WNL   Plan:  Will order KCl 10 mEq IV X 4. - recheck electrolytes on 4/24 with AM labs   Mark Benecke D ,PharmD Clinical Pharmacist 09/04/2023 10:13 PM

## 2023-09-04 NOTE — Progress Notes (Signed)
 Progress Note   Patient: Paul Vaughan ZOX:[id number] DOB: [birth year] DOA: 08/31/2023     4 DOS: the patient was seen and examined on 09/04/2023   Brief hospital course: [name] is a 68 year old male with history of non-insulin-dependent diabetes mellitus, hyperlipidemia, neuropathy, intellectual learning difficulty/dementia, COPD, on baseline 4 L nasal cannula, heart failure preserved ejection fraction, who presents emergency department for chief concerns of respiratory distress noticed by nursing staff.   Vitals in the ED showed temperature of 101.4, respiration rate of 25, heart rate of 130, blood pressure 104/77, SpO2 of 95% on BiPAP.   Serum sodium is 136, potassium 5.0, chloride 100, bicarb 23, BUN 36, serum creatinine 1.39, EGFR 55, nonfasting blood glucose 126, WBC 9.4, hemoglobin 12.4, platelets of 419.BNP was elevated at 486.2.  High sensitive troponin was 14.  Lactic acid was 5.7 and on repeat 1 hour later was 5.9. COVID/influenza A/influenza B/RSV PCR were negative. Blood cultures x 2 are in process.   ED treatment: DuoNebs one-time treatment, acetaminophen  1000 mg IV one-time dose, azithromycin  500 mg IV one-time dose, ceftriaxone  2 g IV one-time dose, LR 2.5 mL liter bolus were given, and vancomycin  per pharmacy.   Patient was recently admitted from 3/16 till 08/19/2023 when presented with generalized weakness, treated for pneumonia and rhabdo.  Patient did had worsening respiratory status during that admission which was stabilizes and he was discharged on 4 L of oxygen to SNF.   4/20: Afebrile with mild tachycardia, remained on BiPAP.  Patient recently treated for multifocal pneumonia.  Also noted significantly elevated CK meeting criteria for rhabdomyolysis with no history of fall.  CK improved with IV fluid, seems like having chronic intermittently elevated CK. MRSA PCR negative so discontinuing vancomycin .  CTA chest was done due to the significant hypoxia, had a  small PE and right upper lobe-started on heparin  infusion.  Did show multifocal pneumonia.  Procalcitonin significantly elevated at 15.77.  Ordered expanded respiratory panel, strep pneumo and Legionella antigen.   4/21: Vital stable but remained on BiPAP.  RVP negative, strep pneumo negative, lactic acidosis resolved.  Labs seems stable.  Patient becoming tachypneic and requiring up to 15 L of oxygen when trying to wean off from BiPAP.  PCCM was also consulted.     4/22: Patient remained little tachypneic and requiring heated high flow at 50 L of oxygen, FiO2 of 65%, VBG with significant hypoxia.  Swallow evaluation with high risk for aspiration, had a very weak cough.  He was placed on dysphagia diet with nectar thick liquid. Palliative care was also consulted.  Assessment and Plan: * Severe sepsis with acute organ dysfunction [facility]) Patient had fever of 101.4, increased respiration, increased heart rate, elevated lactic acid, organ involvement or pulmonary and renal.  Likely secondary to multifocal bilateral pneumonia.  Procalcitonin elevated at 15.77 and preliminary blood cultures negative in 24 hours.  MRSA PCR negative, lactic acidosis resolved.  Expanded RVP negative, strep pneumo negative Legionella pending.  Vancomycin  was discontinued when MRSA PCR was negative.  Procalcitonin improving Swallow evaluation is high risk for recurrent aspiration which is likely contributory to recurrent pneumonia, patient also has very weak cough and very debilitated. - Continue with Zithromax  and ceftriaxone , will add flagyl  given aspiration concern - Continue with supportive care - at high risk for aspiration  Multifocal pneumonia - Please see above  Acute pulmonary embolism (HCC) CTA was done due to acute profound hypoxia and patient with recent prolonged hospitalization and decreased mobility. Came back positive  for a small upper lobe PE - heparin  infusion    Acute on chronic hypoxic respiratory  failure (HCC) Likely multifactorial with bilateral multifocal pneumonia, CTA also shows a small right upper lobe PE, recurrent aspiration with a weak cough, overall physical and mental debility Currently on heated high flow at 50 L of oxygen, FiO2 of 65% Received 1 dose of IV Lasix  yesterday -Pulmonary consulted -Continue with hfnc, is at high risk for needing intubation. DSS guardian conferring with her supervisors regarding goals of care  AKI (acute kidney injury) (HCC) Improved with IV fluid. -Monitor renal function -Avoid nephrotoxins  Elevated LFTs Improved, likely secondary to sepsis.  Chronic diastolic CHF (congestive heart failure) (HCC) Prior echocardiogram done last year with normal EF and grade 1 diastolic dysfunction.  Elevated BNP at 483.  Clinically appears euvolemic.  Patient did receive IV fluid due to sepsis and lactic acidosis. Ef 45-50 on TTE here  Essential (primary) hypertension Mildly elevated blood pressure this morning, patient was not on any antihypertensives at baseline. Hydralazine  5 mg IV every 6 hours as needed for SBP > 175,  Continue to monitor  Adjustment disorder with mixed disturbance of emotions and conduct calm  Chronic depression Home meds when taking po  Hyperlipidemia, unspecified Home meds when taking po   Subjective: Patient remained tachypneic, on 50 L of oxygen. No distress  Physical Exam: Vitals:   09/04/23 1200 09/04/23 1300 09/04/23 1307 09/04/23 1400  BP: 129/70 (!) 91/51  (!) 149/69  Pulse: 94 87 94 (!) 103  Resp: (!) 29 (!) 26 (!) 27 (!) 32  Temp: 98 F (36.7 C)     TempSrc: Axillary     SpO2: 94% 94% 94% 90%  Weight:      Height:       General.  Cognitively impaired, malnourished gentleman, in no acute distress. Pulmonary.  Rhonchi throughout CV.  Regular rate and rhythm, no JVD, rub or murmur. Abdomen.  Soft, nontender, nondistended,  CNS.  Resting, no new focal deficit Extremities.  No edema, no cyanosis, pulses  intact and symmetrical.  Data Reviewed: Prior data reviewed  Family Communication: DSS is legal guardian   Disposition: Status is: Inpatient Remains inpatient appropriate because: Severity of illness  Planned Discharge Destination: Skilled nursing facility  DVT prophylaxis.  Heparin  infusion  Author: Raymonde Calico, MD 09/04/2023 3:37 PM  For on call review [email or web address].

## 2023-09-04 NOTE — Consult Note (Signed)
 PHARMACY - ANTICOAGULATION CONSULT NOTE  Pharmacy Consult for IV heparin  Indication: pulmonary embolus  Allergies  Allergen Reactions   Penicillins Rash and Swelling    .Has patient had a PCN reaction causing immediate rash, facial/tongue/throat swelling, SOB or lightheadedness with hypotension: Unknown Has patient had a PCN reaction causing severe rash involving mucus membranes or skin necrosis: Unknown Has patient had a PCN reaction that required hospitalization: Unknown Has patient had a PCN reaction occurring within the last 10 years: Unknown If all of the above answers are "NO", then may proceed with Cephalosporin use.    Sulfa Antibiotics Rash and Swelling   Patient Measurements: Height: 5\' 8"  (172.7 cm) Weight: 74.6 kg (164 lb 7.4 oz) IBW/kg (Calculated) : 68.4 HEPARIN  DW (KG): 77  Vital Signs: Temp: 97.9 F (36.6 C) (04/23 2000) Temp Source: Axillary (04/23 2000) BP: 99/55 (04/23 2245) Pulse Rate: 118 (04/23 2245)  Labs: Recent Labs    09/02/23 0432 09/02/23 0451 09/04/23 0536 09/04/23 2136  HGB 10.1*  --  9.6*  --   HCT 30.9*  --  28.3*  --   PLT 220  --  188  --   APTT  --   --   --  >200*  HEPARINUNFRC  --  0.37  --   --   CREATININE 0.95  --  1.06  --    Estimated Creatinine Clearance: 64.5 mL/min (by C-G formula based on SCr of 1.06 mg/dL).  Medical History: Past Medical History:  Diagnosis Date   CHF (congestive heart failure) (HCC)    Chronic kidney disease    COPD (chronic obstructive pulmonary disease) (HCC)    Dementia (HCC)    Medications:  No PTA anticoagulation  Received SQ heparin  4/20 @ 0544  Assessment: 68 year old male that presented with respiratory distress. CT angio shows a pulmonary embolus within the right superior pulmonary artery. Pharmacy has been consulted for initiaiton and management of a heparin  infusion. He was transitioned to apixaban  (last dose 09/03/23 2152) but is no longer appropriate to take oral medications  Goal  of Therapy:  Anti-Xa level 0.3-0.7 units/ml aPTT 66 - 102 seconds Monitor platelets by anticoagulation protocol: Yes   Plan:  4/23:  aPTT @ > 200, elevated - RN stated she witnessed sample drawn and it was drawn from opposite arm as infusion.  RN also reports pt experiencing nosebleeds - Will hold heparin  drip for 1 hr and restart @ 1100 units/hr - recheck aPTT level 6 hrs after restart (will check Anti-Xa level once daily but will use aPTT to guide therapy until Anti-Xa level and aPTT correlate) ---CBC daily while on heparin    Jamyria Ozanich D 09/04/2023 11:18 PM

## 2023-09-04 NOTE — Progress Notes (Signed)
 PHARMACY CONSULT NOTE - FOLLOW UP  Pharmacy Consult for Electrolyte Monitoring and Replacement   Recent Labs: Potassium (mmol/L)  Date Value  09/04/2023 3.1 (L)   Magnesium (mg/dL)  Date Value  21/30/8657 2.3   Calcium  (mg/dL)  Date Value  84/69/6295 9.0   Albumin  (g/dL)  Date Value  28/41/3244 2.6 (L)   Phosphorus (mg/dL)  Date Value  05/16/7251 4.0   Sodium (mmol/L)  Date Value  09/04/2023 140     Assessment:  68 y.o. male with medical history significant of dementia, CKD stage IIIa, COPD, CHF, who presented to emergency department for chief concerns of respiratory distress. Pharmacy is asked to follow and replace electrolytes while in CCU  Diuretics: furosemide  40 mg IV BID  Goal of Therapy:  Electrolytes WNL  Plan:  ---10 mEq IV KCl x 3 ---recheck electrolytes in am  Paul Vaughan ,PharmD Clinical Pharmacist 09/04/2023 8:26 AM

## 2023-09-04 NOTE — Consult Note (Signed)
 PHARMACY - ANTICOAGULATION CONSULT NOTE  Pharmacy Consult for IV heparin  Indication: pulmonary embolus  Allergies  Allergen Reactions   Penicillins Rash and Swelling    .Has patient had a PCN reaction causing immediate rash, facial/tongue/throat swelling, SOB or lightheadedness with hypotension: Unknown Has patient had a PCN reaction causing severe rash involving mucus membranes or skin necrosis: Unknown Has patient had a PCN reaction that required hospitalization: Unknown Has patient had a PCN reaction occurring within the last 10 years: Unknown If all of the above answers are "NO", then may proceed with Cephalosporin use.    Sulfa Antibiotics Rash and Swelling   Patient Measurements: Height: 5\' 8"  (172.7 cm) Weight: 74.6 kg (164 lb 7.4 oz) IBW/kg (Calculated) : 68.4 HEPARIN  DW (KG): 77  Vital Signs: Temp: 98.2 F (36.8 C) (04/23 0800) Temp Source: Axillary (04/23 0800) BP: 145/69 (04/23 0600) Pulse Rate: 100 (04/23 0900)  Labs: Recent Labs    09/01/23 1123 09/01/23 1842 09/02/23 0432 09/02/23 0451 09/04/23 0536  HGB  --   --  10.1*  --  9.6*  HCT  --   --  30.9*  --  28.3*  PLT  --   --  220  --  188  APTT 33  --   --   --   --   LABPROT 16.0*  --   --   --   --   INR 1.3*  --   --   --   --   HEPARINUNFRC  --  0.16*  --  0.37  --   CREATININE  --   --  0.95  --  1.06   Estimated Creatinine Clearance: 64.5 mL/min (by C-G formula based on SCr of 1.06 mg/dL).  Medical History: Past Medical History:  Diagnosis Date   CHF (congestive heart failure) (HCC)    Chronic kidney disease    COPD (chronic obstructive pulmonary disease) (HCC)    Dementia (HCC)    Medications:  No PTA anticoagulation  Received SQ heparin  4/20 @ 0544  Assessment: 68 year old male that presented with respiratory distress. CT angio shows a pulmonary embolus within the right superior pulmonary artery. Pharmacy has been consulted for initiaiton and management of a heparin  infusion. He was  transitioned to apixaban  (last dose 09/03/23 2152) but is no longer appropriate to take oral medications  Goal of Therapy:  Anti-Xa level 0.3-0.7 units/ml aPTT 66 - 102 seconds Monitor platelets by anticoagulation protocol: Yes   Plan:  ---start heparin  infusion at 1400 units/hr (given recency of apixaban  administration can avoid a loading dose) ---check aPTT level in 8 hours (will check Anti-Xa level once daily but will use aPTT to guide therapy until Anti-Xa level and aPTT correlate) ---CBC daily while on heparin    Barney Boozer, PharmD, BCPS 09/04/2023 10:45 AM

## 2023-09-04 NOTE — Procedures (Signed)
 Endotracheal Intubation: Patient required placement of an artificial airway secondary to Respiratory Failure  Consent: Emergent.   Hand washing performed prior to starting the procedure.   Medications administered for sedation prior to procedure:  Midazolam  2 mg IV,  Rocuronium  40 mg IV, Fentanyl  100 mcg IV.    A time out procedure was called and correct patient, name, & ID confirmed. Needed supplies and equipment were assembled and checked to include ETT, 10 ml syringe, Glidescope, Mac and Miller blades, suction, oxygen and bag mask valve, end tidal CO2 monitor.   Patient was positioned to align the mouth and pharynx to facilitate visualization of the glottis.   Heart rate, SpO2 and blood pressure was continuously monitored during the procedure. Pre-oxygenation was conducted prior to intubation and endotracheal tube was placed through the vocal cords into the trachea.     The artificial airway was placed under direct visualization via glidescope route using a 24 ETT on the first attempt.  ETT was secured at 24 cm mark.  Placement was confirmed by auscuitation of lungs with good breath sounds bilaterally and no stomach sounds.  Condensation was noted on endotracheal tube.   Pulse ox 98%.  CO2 detector in place with appropriate color change.   Complications: None .    Chest radiograph ordered and pending.   Comments: OGT placed via glidescope.    Moksha Dorgan, M.D.  Pulmonary & Critical Care Medicine  [facility]

## 2023-09-04 NOTE — Progress Notes (Signed)
 Daily Progress Note   Patient Name: Paul Vaughan       Date: 09/04/2023 DOB: 06-Nov-1955  Age: 68 y.o. MRN#: [id number] Attending Physician: Janeane Mealy, MD Primary Care Physician: Meri Stammer, NP Admit Date: 08/31/2023  Reason for Consultation/Follow-up: Establishing goals of care  Subjective: Epic chat received from care team as patient's respiratory status is tenuous.  He is currently on high flow cannula and there is concern for possible impending intubation.  Notes and labs reviewed.  In to see patient.  He is currently resting in bed on high flow nasal cannula.  Called DSS [name] [phone] ).  She discusses that the patient had been living in a group home, but was recently placed into [facility] to help gain strength.  We discussed his pneumonia.  We discussed the inability to do an MBS at this time due to his status.  Discussed aggressive treatment path versus comfort path.  Discussed importance of quality of life.  Discussed CODE STATUS, question of ventilator support, and question of PEG tube.  Comfort care with hospice discussed.  DSS advised that she will speak with her supervisor and begin making decisions on any limits and boundaries to care moving forward.  RN, CCM, attending updated.  Length of Stay: 4  Current Medications: Scheduled Meds:   acetylcysteine   4 mL Nebulization BID   Chlorhexidine  Gluconate Cloth  6 each Topical Q1400   furosemide   40 mg Intravenous BID   ipratropium-albuterol   3 mL Nebulization TID   nystatin   5 mL Oral QID    Continuous Infusions:  azithromycin  Stopped (09/03/23 1510)   cefTRIAXone  (ROCEPHIN )  IV Stopped (09/03/23 2014)   heparin  1,400 Units/hr (09/04/23 1122)   potassium chloride  10 mEq (09/04/23 1120)     PRN Meds: acetaminophen  **OR** acetaminophen , liver oil-zinc  oxide, ondansetron  **OR** ondansetron  (ZOFRAN ) IV, mouth rinse, mouth rinse  Physical Exam Constitutional:      Comments: Eyes closed  Pulmonary:     Comments: On high flow cannula            Vital Signs: BP (!) 91/50   Pulse 94   Temp 98.2 F (36.8 C) (Axillary)   Resp (!) 29   Ht 5\' 8"  (1.727 m)   Wt 74.6 kg   SpO2 93%   BMI 25.01  kg/m  SpO2: SpO2: 93 % O2 Device: O2 Device: Heated High Flow Nasal Cannula O2 Flow Rate: O2 Flow Rate (L/min): 50 L/min  Intake/output summary:  Intake/Output Summary (Last 24 hours) at 09/04/2023 1204 Last data filed at 09/04/2023 0100 Gross per 24 hour  Intake 450.17 ml  Output 1425 ml  Net -974.83 ml   LBM: Last BM Date : 09/02/23 Baseline Weight: Weight: 77 kg Most recent weight: Weight: 74.6 kg    Patient Active Problem List   Diagnosis Date Noted   Acute pulmonary embolism (HCC) 09/01/2023   Severe sepsis with acute organ dysfunction (HCC) 08/31/2023   Acute on chronic hypoxic respiratory failure (HCC) 08/31/2023   Multifocal pneumonia 07/29/2023   Chronic diastolic CHF (congestive heart failure) (HCC) 07/29/2023   Rhabdomyolysis 07/29/2023   Acute hypoxic respiratory failure (HCC) 07/28/2023   Elevated LFTs 07/28/2023   Thrombocytopenia (HCC) 07/01/2021   Anemia of chronic disease 07/01/2021   AKI (acute kidney injury) (HCC) 07/01/2021   Severe sepsis (HCC) 06/30/2021   Acute hypoxemic respiratory failure due to COVID-19 (HCC) 06/30/2021   Chronic depression 06/08/2021   Chronic obstructive pulmonary disease, unspecified (HCC) 06/08/2021   Essential (primary) hypertension 06/08/2021   Dementia with behavioral disturbance (HCC) 06/08/2021   Proteinuria, unspecified 06/08/2021   Hyperlipidemia, unspecified 06/08/2021   Borderline intellectual disability 09/08/2015   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     Palliative Care Assessment & Plan     Recommendations/Plan: Discussed concepts of CODE STATUS, ventilator support, and PEG tube.  She advised that she will speak with her director regarding any boundaries or limits to care. DSS guardian is [name] [phone] ).   Code Status:    Code Status Orders  (From admission, onward)           Start     Ordered   08/31/23 1741  Full code  Continuous       Question:  By:  Answer:  Default: patient does not have capacity for decision making, no surrogate or prior directive available   08/31/23 1741           Code Status History     Date Active Date Inactive Code Status Order ID Comments User Context   08/31/2023 1421 08/31/2023 1740 Full Code [id number]  CoxTrenton Frock, DO ED   07/28/2023 1353 08/19/2023 1706 Full Code [id number]  Avi Body, MD ED   06/30/2021 1239 07/06/2021 1904 Full Code [id number]  Read Camel, MD ED   11/20/2019 0652 12/14/2019 2059 Full Code [id number]  Isa Manuel, MD ED       Prognosis:  poor    Care plan was discussed with CCM and attending  Thank you for allowing the Palliative Medicine Team to assist in the care of this patient.   Meribeth Standard, NP  Please contact Palliative Medicine Team phone at [phone] for questions and concerns.

## 2023-09-04 NOTE — Progress Notes (Signed)
 PULMONOLOGY         Date: 09/04/2023,   MRN# [id number] Paul Vaughan 08-23-1955     AdmissionWeight: 77 kg                 CurrentWeight: 74.6 kg  Referring provider: Dr Ariel Begun   CHIEF COMPLAINT:    Sepsis with shock   HISTORY OF PRESENT ILLNESS   68 yo M with hx of DM, COPD, dyslipidemia, chronic hypoxemia, cognitive impairment, CHF came in febrile hypoxemic requiring BIPAP. He had viral workup done which was negative, he had blood cultures done which are negative x2d.  Cardiac biomarkers have been elevated. Patient has care giver from DSS and I met with [name] to review care plan. He had speech/swallow evaluation with goal to use thickened pureed nourishment.   09/04/23- patient with increased O2 req now on elevated HFNC settings. High risk for intubation.  Palliative care is following  PAST MEDICAL HISTORY   Past Medical History:  Diagnosis Date   CHF (congestive heart failure) (HCC)    Chronic kidney disease    COPD (chronic obstructive pulmonary disease) (HCC)    Dementia (HCC)      SURGICAL HISTORY   History reviewed. No pertinent surgical history.   FAMILY HISTORY   Family History  Family history unknown: Yes     SOCIAL HISTORY   Social History   Tobacco Use   Smoking status: Every Day    Current packs/day: 1.00    Types: Cigarettes   Smokeless tobacco: Never  Substance Use Topics   Alcohol use: No   Drug use: No     MEDICATIONS    Home Medication:    Current Medication:  Current Facility-Administered Medications:    acetaminophen  (TYLENOL ) tablet 650 mg, 650 mg, Oral, Q6H PRN, 650 mg at 09/02/23 1726 **OR** acetaminophen  (TYLENOL ) suppository 650 mg, 650 mg, Rectal, Q6H PRN, Cox, Amy N, DO   acetylcysteine  (MUCOMYST ) 20 % nebulizer / oral solution 4 mL, 4 mL, Nebulization, BID, Anaisha Mago, MD, 4 mL at 09/04/23 0744   apixaban  (ELIQUIS ) tablet 10 mg, 10 mg, Oral, BID, 10 mg at 09/03/23 2152 **FOLLOWED BY**  [START ON 09/09/2023] apixaban  (ELIQUIS ) tablet 5 mg, 5 mg, Oral, BID, Amin, Sumayya, MD   aspirin  EC tablet 81 mg, 81 mg, Oral, q AM, Cox, Amy N, DO, 81 mg at 09/03/23 0604   azithromycin  (ZITHROMAX ) 500 mg in sodium chloride  0.9 % 250 mL IVPB, 500 mg, Intravenous, Q24H, Cox, Amy N, DO, Paused at 09/03/23 1510   cefTRIAXone  (ROCEPHIN ) 2 g in sodium chloride  0.9 % 100 mL IVPB, 2 g, Intravenous, Q24H, Angelica Frandsen, MD, Stopped at 09/03/23 2014   Chlorhexidine  Gluconate Cloth 2 % PADS 6 each, 6 each, Topical, Q1400, Cox, Amy N, DO, 6 each at 09/03/23 1415   divalproex  (DEPAKOTE  ER) 24 hr tablet 500 mg, 500 mg, Oral, Daily, Cox, Amy N, DO, 500 mg at 09/03/23 1012   divalproex  (DEPAKOTE  ER) 24 hr tablet 500 mg, 500 mg, Oral, QHS, Ramonita Burow, RPH, 500 mg at 09/03/23 2155   escitalopram  (LEXAPRO ) tablet 5 mg, 5 mg, Oral, QHS, Cox, Amy N, DO, 5 mg at 09/03/23 2154   folic acid  (FOLVITE ) tablet 1 mg, 1 mg, Oral, q AM, Cox, Amy N, DO, 1 mg at 09/03/23 0604   furosemide  (LASIX ) injection 40 mg, 40 mg, Intravenous, BID, Christyna Letendre, MD, 40 mg at 09/04/23 0944   gabapentin  (NEURONTIN ) capsule 100 mg, 100 mg,  Oral, TID, Cox, Amy N, DO, 100 mg at 09/03/23 2152   ipratropium-albuterol  (DUONEB) 0.5-2.5 (3) MG/3ML nebulizer solution 3 mL, 3 mL, Nebulization, TID, Amin, Sumayya, MD, 3 mL at 09/04/23 0744   liver oil-zinc  oxide (DESITIN) 40 % ointment, , Topical, PRN, Amin, Sumayya, MD, 1 Application at 09/01/23 1754   multivitamin with minerals tablet 1 tablet, 1 tablet, Oral, Daily, Cox, Amy N, DO, 1 tablet at 09/03/23 1013   nystatin  (MYCOSTATIN ) 100000 UNIT/ML suspension 500,000 Units, 5 mL, Oral, QID, Amin, Sumayya, MD, 500,000 Units at 09/03/23 2157   ondansetron  (ZOFRAN ) tablet 4 mg, 4 mg, Oral, Q6H PRN **OR** ondansetron  (ZOFRAN ) injection 4 mg, 4 mg, Intravenous, Q6H PRN, Cox, Amy N, DO   Oral care mouth rinse, 15 mL, Mouth Rinse, PRN, Luna Salinas, MD   Oral care mouth rinse, 15 mL, Mouth  Rinse, PRN, Rust[place], Jenni Mody L, NP   potassium chloride  10 mEq in 100 mL IVPB, 10 mEq, Intravenous, Q1 Hr x 3, [name], RPH, Last Rate: 100 mL/hr at 09/04/23 0930, 10 mEq at 09/04/23 0930   pyridOXINE  (VITAMIN B6) tablet 100 mg, 100 mg, Oral, QHS, Cox, Amy N, DO, 100 mg at 09/03/23 2155   QUEtiapine  (SEROQUEL ) tablet 100 mg, 100 mg, Oral, QHS, [place], Phoebe Breed, NP, 100 mg at 09/03/23 2151   QUEtiapine  (SEROQUEL ) tablet 50 mg, 50 mg, Oral, 2 times per day, Cox, Amy N, DO, 50 mg at 09/03/23 1415   rosuvastatin  (CRESTOR ) tablet 20 mg, 20 mg, Oral, QHS, Cox, Amy N, DO, 20 mg at 09/03/23 2150   thiamine  (VITAMIN B1) tablet 100 mg, 100 mg, Oral, q AM, Cox, Amy N, DO, 100 mg at 09/03/23 0604    ALLERGIES   Penicillins and Sulfa antibiotics     REVIEW OF SYSTEMS    Review of Systems:  Gen:  Denies  fever, sweats, chills weigh loss  HEENT: Denies blurred vision, double vision, ear pain, eye pain, hearing loss, nose bleeds, sore throat Cardiac:  No dizziness, chest pain or heaviness, chest tightness,edema Resp:   reports dyspnea chronically  Gi: Denies swallowing difficulty, stomach pain, nausea or vomiting, diarrhea, constipation, bowel incontinence Gu:  Denies bladder incontinence, burning urine Ext:   Denies Joint pain, stiffness or swelling Skin: Denies  skin rash, easy bruising or bleeding or hives Endoc:  Denies polyuria, polydipsia , polyphagia or weight change Psych:   Denies depression, insomnia or hallucinations   Other:  All other systems negative   VS: BP (!) 145/69   Pulse 100   Temp 98.2 F (36.8 C) (Axillary)   Resp (!) 35   Ht 5\' 8"  (1.727 m)   Wt 74.6 kg   SpO2 (!) 88%   BMI 25.01 kg/m      PHYSICAL EXAM    GENERAL:NAD, no fevers, chills, no weakness no fatigue HEAD: Normocephalic, atraumatic.  EYES: Pupils equal, round, reactive to light. Extraocular muscles intact. No scleral icterus.  MOUTH: Moist mucosal membrane. Dentition intact.  No abscess noted.  EAR, NOSE, THROAT: Clear without exudates. No external lesions.  NECK: Supple. No thyromegaly. No nodules. No JVD.  PULMONARY: decreased breath sounds with mild rhonchi worse at bases bilaterally.  CARDIOVASCULAR: S1 and S2. Regular rate and rhythm. No murmurs, rubs, or gallops. No edema. Pedal pulses 2+ bilaterally.  GASTROINTESTINAL: Soft, nontender, nondistended. No masses. Positive bowel sounds. No hepatosplenomegaly.  MUSCULOSKELETAL: No swelling, clubbing, or edema. Range of motion full in all extremities.  NEUROLOGIC: Cranial nerves II through XII  are intact. No gross focal neurological deficits. Sensation intact. Reflexes intact.  SKIN: No ulceration, lesions, rashes, or cyanosis. Skin warm and dry. Turgor intact.  PSYCHIATRIC: Mood, affect within normal limits. The patient is awake, alert and oriented x 3. Insight, judgment intact.       IMAGING   Reviewed CT chest - 09/01/23- multifocal pneumonia and PE  ASSESSMENT/PLAN   Acute hypoxemic respiratory failure     - continue antbitics- patient with multifocal pneumonia - dc cefepime , continue zithromax  start rocephin     Pulmonary embolism     - continue anticoagulation    Pulmonary edema and pleural effusion      - lasix  40 bid    Bibasilar atelectasis     - bed chest pt due to cognitive impairment    - VEST therapy      Bilateral mucus plugging     - mucomyst  20 % 4ml BID       Critical care provider statement:   Total critical care time: 33 minutes   Performed by: Jaclynn Mast MD   Critical care time was exclusive of separately billable procedures and treating other patients.   Critical care was necessary to treat or prevent imminent or life-threatening deterioration.   Critical care was time spent personally by me on the following activities: development of treatment plan with patient and/or surrogate as well as nursing, discussions with consultants, evaluation of patient's response to  treatment, examination of patient, obtaining history from patient or surrogate, ordering and performing treatments and interventions, ordering and review of laboratory studies, ordering and review of radiographic studies, pulse oximetry and re-evaluation of patient's condition.    Janelie Goltz, M.D.  Pulmonary & Critical Care Medicine         Erskin Hearing, M.D.  Division of Pulmonary & Critical Care Medicine

## 2023-09-04 NOTE — Plan of Care (Signed)
  Problem: Fluid Volume: Goal: Hemodynamic stability will improve Outcome: Progressing   Problem: Clinical Measurements: Goal: Diagnostic test results will improve Outcome: Progressing Goal: Signs and symptoms of infection will decrease Outcome: Progressing   Problem: Clinical Measurements: Goal: Ability to maintain clinical measurements within normal limits will improve Outcome: Progressing Goal: Will remain free from infection Outcome: Progressing Goal: Diagnostic test results will improve Outcome: Progressing Goal: Cardiovascular complication will be avoided Outcome: Progressing   Problem: Respiratory: Goal: Ability to maintain adequate ventilation will improve Outcome: Not Progressing   Problem: Education: Goal: Knowledge of General Education information will improve Description: Including pain rating scale, medication(s)/side effects and non-pharmacologic comfort measures Outcome: Not Progressing   Problem: Health Behavior/Discharge Planning: Goal: Ability to manage health-related needs will improve Outcome: Not Progressing   Problem: Clinical Measurements: Goal: Respiratory complications will improve Outcome: Not Progressing

## 2023-09-04 NOTE — Progress Notes (Signed)
 SLP Cancellation Note  Patient Details Name: Paul Vaughan MRN: [id number] DOB: September 04, 1955   Cancelled treatment:       Reason Eval/Treat Not Completed: Medical issues which prohibited therapy;Patient not medically ready (chart reviewed; consulted NSG and MD re: pt's Pulmonary status currently.)  Currently, pt has been demonstrating inconsistency w/ toleration of diet; NSG reports intermittent congested coughing both during po's and when not eating/drinking. MD is having to provided increased O2 support; HFNC at 50L currently w/ O2 sats in the upper 80s. MD is concerned re: need for oral intubation for support/tx.  Recommended NPO status -- pt has only been taking minimal po's at meals per NSG report. ST services will monitor pt's evolving status next 2-3 days. Discussed w/ MD that pt is not appropriate for a MBSS in current medical status. MD agreed.      Darla Edward, MS, CCC-SLP Speech Language Pathologist Rehab Services; [facility] [phone] (ascom) Dariann Huckaba 09/04/2023, 10:28 AM

## 2023-09-05 DIAGNOSIS — A419 Sepsis, unspecified organism: Secondary | ICD-10-CM | POA: Diagnosis not present

## 2023-09-05 DIAGNOSIS — R6520 Severe sepsis without septic shock: Secondary | ICD-10-CM | POA: Diagnosis not present

## 2023-09-05 DIAGNOSIS — Z7189 Other specified counseling: Secondary | ICD-10-CM | POA: Diagnosis not present

## 2023-09-05 LAB — CBC
HCT: 28.1 % — ABNORMAL LOW (ref 39.0–52.0)
Hemoglobin: 9.5 g/dL — ABNORMAL LOW (ref 13.0–17.0)
MCH: 32.1 pg (ref 26.0–34.0)
MCHC: 33.8 g/dL (ref 30.0–36.0)
MCV: 94.9 fL (ref 80.0–100.0)
Platelets: 171 10*3/uL (ref 150–400)
RBC: 2.96 MIL/uL — ABNORMAL LOW (ref 4.22–5.81)
RDW: 16.1 % — ABNORMAL HIGH (ref 11.5–15.5)
WBC: 12.5 10*3/uL — ABNORMAL HIGH (ref 4.0–10.5)
nRBC: 0.2 % (ref 0.0–0.2)

## 2023-09-05 LAB — BASIC METABOLIC PANEL WITH GFR
Anion gap: 12 (ref 5–15)
BUN: 50 mg/dL — ABNORMAL HIGH (ref 8–23)
CO2: 30 mmol/L (ref 22–32)
Calcium: 8.8 mg/dL — ABNORMAL LOW (ref 8.9–10.3)
Chloride: 99 mmol/L (ref 98–111)
Creatinine, Ser: 1.14 mg/dL (ref 0.61–1.24)
GFR, Estimated: >60 mL/min (ref >60)
Glucose, Bld: 134 mg/dL — ABNORMAL HIGH (ref 70–99)
Potassium: 3.8 mmol/L (ref 3.5–5.1)
Sodium: 141 mmol/L (ref 135–145)

## 2023-09-05 LAB — GLUCOSE, CAPILLARY
Glucose-Capillary: 109 mg/dL — ABNORMAL HIGH (ref 70–99)
Glucose-Capillary: 114 mg/dL — ABNORMAL HIGH (ref 70–99)
Glucose-Capillary: 119 mg/dL — ABNORMAL HIGH (ref 70–99)
Glucose-Capillary: 130 mg/dL — ABNORMAL HIGH (ref 70–99)
Glucose-Capillary: 130 mg/dL — ABNORMAL HIGH (ref 70–99)
Glucose-Capillary: 133 mg/dL — ABNORMAL HIGH (ref 70–99)
Glucose-Capillary: 148 mg/dL — ABNORMAL HIGH (ref 70–99)

## 2023-09-05 LAB — CULTURE, BLOOD (ROUTINE X 2)
Culture: NO GROWTH
Culture: NO GROWTH
Special Requests: ADEQUATE
Special Requests: ADEQUATE

## 2023-09-05 LAB — PHOSPHORUS: Phosphorus: 4.8 mg/dL — ABNORMAL HIGH (ref 2.5–4.6)

## 2023-09-05 LAB — APTT
aPTT: 85 s — ABNORMAL HIGH (ref 24–36)
aPTT: 129 s — ABNORMAL HIGH (ref 24–36)

## 2023-09-05 LAB — HEPARIN LEVEL (UNFRACTIONATED): Heparin Unfractionated: >1.1 [IU]/mL — ABNORMAL HIGH (ref 0.30–0.70)

## 2023-09-05 LAB — MAGNESIUM: Magnesium: 2.2 mg/dL (ref 1.7–2.4)

## 2023-09-05 MED ORDER — PANTOPRAZOLE SODIUM 40 MG IV SOLR
40.0000 mg | Freq: Two times a day (BID) | INTRAVENOUS | Status: DC
Start: 2023-09-05 — End: 2023-09-16
  Administered 2023-09-05 – 2023-09-15 (×21): 40 mg via INTRAVENOUS
  Filled 2023-09-05 (×21): qty 10

## 2023-09-05 NOTE — Plan of Care (Signed)
  Problem: Fluid Volume: Goal: Hemodynamic stability will improve Outcome: Progressing   Problem: Clinical Measurements: Goal: Diagnostic test results will improve Outcome: Progressing   Problem: Elimination: Goal: Will not experience complications related to bowel motility Outcome: Progressing Goal: Will not experience complications related to urinary retention Outcome: Progressing   Problem: Respiratory: Goal: Ability to maintain adequate ventilation will improve Outcome: Not Progressing   Problem: Clinical Measurements: Goal: Respiratory complications will improve Outcome: Not Progressing   Problem: Nutrition: Goal: Adequate nutrition will be maintained Outcome: Not Progressing   Problem: Respiratory: Goal: Ability to maintain adequate ventilation will improve Outcome: Not Progressing

## 2023-09-05 NOTE — Progress Notes (Signed)
 SLP Cancellation Note  Patient Details Name: Paul Vaughan MRN: [id number] DOB: 1956-02-27   Cancelled treatment:       Reason Eval/Treat Not Completed: Medical issues which prohibited therapy;Patient not medically ready (chart reviewed)  Per chart notes, pt was orally intubated yesterday evening d/t further decline in Pulmonary status. Patient had remained tachypneic requiring heated high flow at 50 L of oxygen, FiO2 of 65% in recent days, VBG with significant hypoxia. Pt was at a High risk for aspiration and had been on a dysphagia diet with nectar thick liquid w/ strict aspiration precautions. ST services will sign off at this time w/ MD to reconsult in future when pt is appropriate for assessment.      Darla Edward, MS, CCC-SLP Speech Language Pathologist Rehab Services; [facility] [phone] (ascom) Zylah Elsbernd 09/05/2023, 9:26 AM

## 2023-09-05 NOTE — Procedures (Addendum)
 PROCEDURE: BRONCHOSCOPY Therapeutic Aspiration of Tracheobronchial Tree  PROCEDURE DATE: 09/05/2023  TIME:  NAME:  Herron Fero  DOB:November 19, 1955  MRN: [id number] LOC:  IC16A/IC16A-AA    HOSP DAY: @LENGTHOFSTAYDAYS @ CODE STATUS:      Code Status Orders  (From admission, onward)           Start     Ordered   08/31/23 1741  Full code  Continuous       Question:  By:  Answer:  Default: patient does not have capacity for decision making, no surrogate or prior directive available   08/31/23 1741           Code Status History     Date Active Date Inactive Code Status Order ID Comments User Context   08/31/2023 1421 08/31/2023 1740 Full Code [id number]  CoxTrenton Frock, DO ED   07/28/2023 1353 08/19/2023 1706 Full Code [id number]  Avi Body, MD ED   06/30/2021 1239 07/06/2021 1904 Full Code [id number]  Read Camel, MD ED   11/20/2019 0652 12/14/2019 2059 Full Code [id number]  Isa Manuel, MD ED           Indications/Preliminary Diagnosis:   Consent: (Place X beside choice/s below)  The benefits, risks and possible complications of the procedure were        explained to:  ___ patient  ___ patient's family  ___ other:___________  who verbalized understanding and gave:  __x_ verbal  ___ written  ___ verbal and written  __x_ telephone  ___ other:________ consent.      Unable to obtain consent; procedure performed on emergent basis.     Other: I obtained consent from DSS DIRECTOR [name]      PRESEDATION ASSESSMENT: History and Physical has been performed. Patient meds and allergies have been reviewed. Presedation airway examination has been performed and documented. Baseline vital signs, sedation score, oxygenation status, and cardiac rhythm were reviewed. Patient was deemed to be in satisfactory condition to undergo the procedure.    PREMEDICATIONS:   Sedative/Narcotic Amt Dose       PROCEDURE DETAILS: Timeout performed and correct patient, name, & ID  confirmed. Following prep per Pulmonary policy, appropriate sedation was administered. The Bronchoscope was inserted in to oral cavity with bite block in place. Therapeutic aspiration of Tracheobronchial tree was performed.  Airway exam proceeded with findings, technical procedures, and specimen collection as noted below. At the end of exam the scope was withdrawn without incident. Impression and Plan as noted below.           Airway Prep (Place X beside choice below)   1% Transtracheal Lidocaine  Anesthetization 7 cc   Patient prepped per Bronchoscopy Lab Policy       Insertion Route (Place X beside choice below)   Nasal   Oral  X Endotracheal Tube   Tracheostomy   INTRAPROCEDURE MEDICATIONS:  Sedative/Narcotic Amt Dose   Versed   mg   Fentanyl  GTT mcg  Diprivan   mg       Medication Amt Dose  Medication Amt Dose  Lidocaine  1%  cc  Epinephrine 1:10,000 sol  cc  Xylocaine  4%  cc  Cocaine  cc   TECHNICAL PROCEDURES: (Place X beside choice below)   Procedures  Description    None     Electrocautery     Cryotherapy     Balloon Dilatation     Bronchography     Stent Placement   X  Therapeutic Aspiration BILATERALLY    Laser/Argon Plasma  Brachytherapy Catheter Placement    Foreign Body Removal         SPECIMENS (Sites): (Place X beside choice below)  Specimens Description   No Specimens Obtained     Washings   X Lavage [facility]   Biopsies    Fine Needle Aspirates    Brushings    Sputum    FINDINGS:  Bilaterally mucosa is erythematous with dark brown mucus and blood.  This was washed with bronchial washings, mucus plugging was treated with therapeutic aspiration of tracheobronchial tree.  BAL was done at RML   ESTIMATED BLOOD LOSS: none COMPLICATIONS/RESOLUTION: none      IMPRESSION:POST-PROCEDURE DX:   Bilaterally mucosa is erythematous with dark brown mucus and blood.  This was washed with bronchial washings, mucus plugging was treated with therapeutic  aspiration of tracheobronchial tree.  BAL was done at [facility]  RECOMMENDATION/PLAN:   Awaiting culture results from BAL microbiology     Erskin Hearing, M.D.  Pulmonary & Critical Care Medicine  [facility]

## 2023-09-05 NOTE — Progress Notes (Signed)
 PULMONOLOGY         Date: 09/05/2023,   MRN# [id number] Paul Vaughan 08-19-1955     AdmissionWeight: 77 kg                 CurrentWeight: 74.6 kg  Referring provider: Dr Ariel Begun   CHIEF COMPLAINT:    Sepsis with shock   HISTORY OF PRESENT ILLNESS   68 yo M with hx of DM, COPD, dyslipidemia, chronic hypoxemia, cognitive impairment, CHF came in febrile hypoxemic requiring BIPAP. He had viral workup done which was negative, he had blood cultures done which are negative x2d.  Cardiac biomarkers have been elevated. Patient has care giver from DSS and I met with [name] to review care plan. He had speech/swallow evaluation with goal to use thickened pureed nourishment.   09/04/23- patient with increased O2 req now on elevated HFNC settings. High risk for intubation.  Palliative care is following 09/05/23-patient continues to have thick mucus per ETT and blood.  I will reach out to DSS to discuss his goals of care and if remains full scope may need to have EGD and bronchoscopy.   PAST MEDICAL HISTORY   Past Medical History:  Diagnosis Date   CHF (congestive heart failure) (HCC)    Chronic kidney disease    COPD (chronic obstructive pulmonary disease) (HCC)    Dementia (HCC)      SURGICAL HISTORY   History reviewed. No pertinent surgical history.   FAMILY HISTORY   Family History  Family history unknown: Yes     SOCIAL HISTORY   Social History   Tobacco Use   Smoking status: Every Day    Current packs/day: 1.00    Types: Cigarettes   Smokeless tobacco: Never  Substance Use Topics   Alcohol use: No   Drug use: No     MEDICATIONS    Home Medication:    Current Medication:  Current Facility-Administered Medications:    0.9 %  sodium chloride  infusion, 250 mL, Intravenous, Continuous, Rust-Chester, Britton L, NP, Last Rate: 10 mL/hr at 09/05/23 1000, Infusion Verify at 09/05/23 1000   acetaminophen  (TYLENOL ) tablet 650 mg, 650 mg, Oral,  Q6H PRN, 650 mg at 09/02/23 1726 **OR** acetaminophen  (TYLENOL ) suppository 650 mg, 650 mg, Rectal, Q6H PRN, Cox, Amy N, DO   acetylcysteine  (MUCOMYST ) 20 % nebulizer / oral solution 4 mL, 4 mL, Nebulization, BID, Pattiann Solanki, MD, 4 mL at 09/05/23 0739   cefTRIAXone  (ROCEPHIN ) 2 g in sodium chloride  0.9 % 100 mL IVPB, 2 g, Intravenous, Q24H, Marveen Donlon, MD, Stopped at 09/04/23 1825   Chlorhexidine  Gluconate Cloth 2 % PADS 6 each, 6 each, Topical, Q1400, Cox, Amy N, DO, 6 each at 09/04/23 1400   docusate (COLACE) 50 MG/5ML liquid 100 mg, 100 mg, Per Tube, BID, Rust-Chester, Britton L, NP, 100 mg at 09/04/23 2204   famotidine  (PEPCID ) tablet 20 mg, 20 mg, Per Tube, BID, Rust-Chester, Britton L, NP, 20 mg at 09/04/23 2204   fentaNYL  (SUBLIMAZE ) bolus via infusion 25-100 mcg, 25-100 mcg, Intravenous, Q15 min PRN, Rust-Chester, Britton L, NP, 100 mcg at 09/05/23 0112   fentaNYL  in NS (24mcg/ml) infusion-PREMIX, 0-400 mcg/hr, Intravenous, Continuous, Shaindy Reader, MD, Last Rate: 10 mL/hr at 09/05/23 1000, 100 mcg/hr at 09/05/23 1000   furosemide  (LASIX ) injection 40 mg, 40 mg, Intravenous, BID, Dalores Weger, MD, 40 mg at 09/05/23 0814   heparin  ADULT infusion 100 units/mL (25000 units/250mL), 1,100 Units/hr, Intravenous, Continuous, Red Mandt, MD, Last  Rate: 11 mL/hr at 09/05/23 1000, 1,100 Units/hr at 09/05/23 1000   ipratropium-albuterol  (DUONEB) 0.5-2.5 (3) MG/3ML nebulizer solution 3 mL, 3 mL, Nebulization, TID, Amin, Sumayya, MD, 3 mL at 09/05/23 0739   liver oil-zinc  oxide (DESITIN) 40 % ointment, , Topical, PRN, Amin, Sumayya, MD, 1 Application at 09/01/23 1754   metroNIDAZOLE  (FLAGYL ) IVPB 500 mg, 500 mg, Intravenous, Q12H, Wouk, Haynes Lips, MD, Stopped at 09/05/23 0457   midazolam  (VERSED ) injection 1-2 mg, 1-2 mg, Intravenous, Q1H PRN, Rust-Chester, Gara July, NP   norepinephrine  (LEVOPHED ) 4mg  in (0.016 mg/mL) premix infusion, 2-10 mcg/min, Intravenous,  Titrated, Rust-Chester, Britton L, NP, Last Rate: 11.25 mL/hr at 09/05/23 1000, 3 mcg/min at 09/05/23 1000   nystatin  (MYCOSTATIN ) 100000 UNIT/ML suspension 500,000 Units, 5 mL, Oral, QID, Amin, Sumayya, MD, 500,000 Units at 09/04/23 2204   ondansetron  (ZOFRAN ) tablet 4 mg, 4 mg, Oral, Q6H PRN **OR** ondansetron  (ZOFRAN ) injection 4 mg, 4 mg, Intravenous, Q6H PRN, Cox, Amy N, DO   Oral care mouth rinse, 15 mL, Mouth Rinse, Q2H, Rust-Chester, Britton L, NP, 15 mL at 09/05/23 0818   Oral care mouth rinse, 15 mL, Mouth Rinse, PRN, Rust-Chester, Jenni Mody L, NP   pantoprazole  (PROTONIX ) injection 40 mg, 40 mg, Intravenous, Q12H, Cherylann Corpus D, NP   polyethylene glycol (MIRALAX  / GLYCOLAX ) packet 17 g, 17 g, Per Tube, Daily, Rust-Chester, Gara July, NP, 17 g at 09/04/23 2016    ALLERGIES   Penicillins and Sulfa antibiotics     REVIEW OF SYSTEMS    Review of Systems:  Gen:  Denies  fever, sweats, chills weigh loss  HEENT: Denies blurred vision, double vision, ear pain, eye pain, hearing loss, nose bleeds, sore throat Cardiac:  No dizziness, chest pain or heaviness, chest tightness,edema Resp:   reports dyspnea chronically  Gi: Denies swallowing difficulty, stomach pain, nausea or vomiting, diarrhea, constipation, bowel incontinence Gu:  Denies bladder incontinence, burning urine Ext:   Denies Joint pain, stiffness or swelling Skin: Denies  skin rash, easy bruising or bleeding or hives Endoc:  Denies polyuria, polydipsia , polyphagia or weight change Psych:   Denies depression, insomnia or hallucinations   Other:  All other systems negative   VS: BP (!) 120/57   Pulse (!) 102   Temp 98.6 F (37 C) (Axillary)   Resp 14   Ht 5\' 8"  (1.727 m)   Wt 74.6 kg   SpO2 (!) 89%   BMI 25.01 kg/m      PHYSICAL EXAM    GENERAL:NAD, no fevers, chills, no weakness no fatigue HEAD: Normocephalic, atraumatic.  EYES: Pupils equal, round, reactive to light. Extraocular muscles intact. No  scleral icterus.  MOUTH: Moist mucosal membrane. Dentition intact. No abscess noted.  EAR, NOSE, THROAT: Clear without exudates. No external lesions.  NECK: Supple. No thyromegaly. No nodules. No JVD.  PULMONARY: decreased breath sounds with mild rhonchi worse at bases bilaterally.  CARDIOVASCULAR: S1 and S2. Regular rate and rhythm. No murmurs, rubs, or gallops. No edema. Pedal pulses 2+ bilaterally.  GASTROINTESTINAL: Soft, nontender, nondistended. No masses. Positive bowel sounds. No hepatosplenomegaly.  MUSCULOSKELETAL: No swelling, clubbing, or edema. Range of motion full in all extremities.  NEUROLOGIC: Cranial nerves II through XII are intact. No gross focal neurological deficits. Sensation intact. Reflexes intact.  SKIN: No ulceration, lesions, rashes, or cyanosis. Skin warm and dry. Turgor intact.  PSYCHIATRIC: Mood, affect within normal limits. The patient is awake, alert and oriented x 3. Insight, judgment intact.  IMAGING   Reviewed CT chest - 09/01/23- multifocal pneumonia and PE  ASSESSMENT/PLAN   Acute hypoxemic respiratory failure     - continue antbitics- patient with multifocal pneumonia - dc cefepime , continue zithromax  start rocephin     Pulmonary embolism     - continue anticoagulation    Pulmonary edema and pleural effusion      - lasix  40 bid    Bibasilar atelectasis     - bed chest pt due to cognitive impairment    - VEST therapy      Bilateral mucus plugging     - mucomyst  20 % 4ml BID       Critical care provider statement:   Total critical care time: 33 minutes   Performed by: Jaclynn Mast MD   Critical care time was exclusive of separately billable procedures and treating other patients.   Critical care was necessary to treat or prevent imminent or life-threatening deterioration.   Critical care was time spent personally by me on the following activities: development of treatment plan with patient and/or surrogate as well as nursing,  discussions with consultants, evaluation of patient's response to treatment, examination of patient, obtaining history from patient or surrogate, ordering and performing treatments and interventions, ordering and review of laboratory studies, ordering and review of radiographic studies, pulse oximetry and re-evaluation of patient's condition.      Anthonee Gelin, M.D.  Division of Pulmonary & Critical Care Medicine

## 2023-09-05 NOTE — Progress Notes (Signed)
 PHARMACY CONSULT NOTE - FOLLOW UP  Pharmacy Consult for Electrolyte Monitoring and Replacement   Recent Labs: Potassium (mmol/L)  Date Value  09/05/2023 3.8   Magnesium (mg/dL)  Date Value  14/78/2956 2.2   Calcium  (mg/dL)  Date Value  21/30/8657 8.8 (L)   Albumin  (g/dL)  Date Value  84/69/6295 2.6 (L)   Phosphorus (mg/dL)  Date Value  28/41/3244 4.8 (H)   Sodium (mmol/L)  Date Value  09/05/2023 141     Assessment:  68 y.o. male with medical history significant of dementia, CKD stage IIIa, COPD, CHF, who presented to emergency department for chief concerns of respiratory distress. Pharmacy is asked to follow and replace electrolytes while in CCU  Diuretics: furosemide  40 mg IV BID  Goal of Therapy:  Electrolytes WNL  Plan:  ---no electrolyte replacement warranted for today ---recheck electrolytes in am  Paul Vaughan ,PharmD Clinical Pharmacist 09/05/2023 7:25 AM

## 2023-09-05 NOTE — Progress Notes (Signed)
 Daily Progress Note   Patient Name: Paul Vaughan       Date: 09/05/2023 DOB: [birth year]  Age: 68 y.o. MRN#: [id number] Attending Physician: Erskin Hearing, MD Primary Care Physician: Meri Stammer, NP Admit Date: 08/31/2023  Reason for Consultation/Follow-up: Establishing goals of care  Subjective: Notes and labs reviewed. In to see patient.  He is currently resting in bed on ventilator support.  No family/visitors at bedside.  Spoke with CCM in depth about case.  He shares that he has spoken with DSS legal guardian and at this time they would like to complete bronchoscopy and see how patient does, and make decisions from there.  Discussed dysphagia diet and concerns for potential recurrent aspiration.  Will need time for outcomes.   Length of Stay: 5  Current Medications: Scheduled Meds:   acetylcysteine   4 mL Nebulization BID   Chlorhexidine  Gluconate Cloth  6 each Topical Q1400   docusate  100 mg Per Tube BID   famotidine   20 mg Per Tube BID   furosemide   40 mg Intravenous BID   ipratropium-albuterol   3 mL Nebulization TID   nystatin   5 mL Oral QID   mouth rinse  15 mL Mouth Rinse Q2H   pantoprazole  (PROTONIX ) IV  40 mg Intravenous Q12H   polyethylene glycol  17 g Per Tube Daily    Continuous Infusions:  sodium chloride  Stopped (09/05/23 1343)   cefTRIAXone  (ROCEPHIN )  IV Stopped (09/04/23 1825)   fentaNYL  infusion INTRAVENOUS 100 mcg/hr (09/05/23 1410)   metronidazole  Stopped (09/05/23 0457)   norepinephrine  (LEVOPHED ) Adult infusion 3 mcg/min (09/05/23 1410)    PRN Meds: fentaNYL , liver oil-zinc  oxide, midazolam , mouth rinse  Physical Exam Constitutional:      Comments: Eyes closed  HENT:     Head: Normocephalic.  Pulmonary:     Comments: On  ventilator Skin:    General: Skin is warm and dry.             Vital Signs: BP (!) 96/55   Pulse (!) 120   Temp 98.6 F (37 C)   Resp (!) 9   Ht 5\' 8"  (1.727 m)   Wt 74.6 kg   SpO2 97%   BMI 25.01 kg/m  SpO2: SpO2: 97 % O2 Device: O2 Device: Ventilator O2 Flow Rate: O2 Flow Rate (L/min): 50  L/min  Intake/output summary:  Intake/Output Summary (Last 24 hours) at 09/05/2023 1536 Last data filed at 09/05/2023 1410 Gross per 24 hour  Intake 2013.91 ml  Output 710 ml  Net 1303.91 ml   LBM: Last BM Date : 09/02/23 Baseline Weight: Weight: 77 kg Most recent weight: Weight: 74.6 kg      Patient Active Problem List   Diagnosis Date Noted   Acute pulmonary embolism (HCC) 09/01/2023   Severe sepsis with acute organ dysfunction (HCC) 08/31/2023   Acute on chronic hypoxic respiratory failure (HCC) 08/31/2023   Multifocal pneumonia 07/29/2023   Chronic diastolic CHF (congestive heart failure) (HCC) 07/29/2023   Rhabdomyolysis 07/29/2023   Acute hypoxic respiratory failure (HCC) 07/28/2023   Elevated LFTs 07/28/2023   Thrombocytopenia (HCC) 07/01/2021   Anemia of chronic disease 07/01/2021   AKI (acute kidney injury) (HCC) 07/01/2021   Severe sepsis (HCC) 06/30/2021   Acute hypoxemic respiratory failure due to COVID-19 (HCC) 06/30/2021   Chronic depression 06/08/2021   Chronic obstructive pulmonary disease, unspecified (HCC) 06/08/2021   Essential (primary) hypertension 06/08/2021   Dementia with behavioral disturbance (HCC) 06/08/2021   Proteinuria, unspecified 06/08/2021   Hyperlipidemia, unspecified 06/08/2021   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  Plans for bronchoscopy and then time for outcomes. PMT will follow.  Code Status:    Code Status Orders  (From admission,  onward)           Start     Ordered   08/31/23 1741  Full code  Continuous       Question:  By:  Answer:  Default: patient does not have capacity for decision making, no surrogate or prior directive available   08/31/23 1741           Code Status History     Date Active Date Inactive Code Status Order ID Comments User Context   08/31/2023 1421 08/31/2023 1740 Full Code [id number]  CoxTrenton Frock, DO ED   07/28/2023 1353 08/19/2023 1706 Full Code [id number]  Avi Body, MD ED   06/30/2021 1239 07/06/2021 1904 Full Code [id number]  Read Camel, MD ED   11/20/2019 0652 12/14/2019 2059 Full Code [id number]  Isa Manuel, MD ED       Prognosis:  Poor overall  Care plan was discussed with CCM  Thank you for allowing the Palliative Medicine Team to assist in the care of this patient.   Meribeth Standard, NP  Please contact Palliative Medicine Team phone at [phone] for questions and concerns.

## 2023-09-05 NOTE — Progress Notes (Signed)
 Initial Nutrition Assessment  DOCUMENTATION CODES:   Non-severe (moderate) malnutrition in context of chronic illness  INTERVENTION:   Once appropriate for tube feeds:  Vital 1.2@65ml /hr- Initiate at 18ml/hr and increase by 15ml/hr q 8 hours until goal rate is reached.   Free water flushes 30ml q4 hours to maintain tube patency   Regimen provides 1872kcal/day, 117g/day protein and 1445ml/day of free water.   Pt at high refeed risk; recommend monitor potassium, magnesium and phosphorus labs daily until stable  Daily weights   NUTRITION DIAGNOSIS:   Moderate Malnutrition related to chronic illness as evidenced by moderate fat depletion, moderate muscle depletion.  GOAL:   Provide needs based on ASPEN/SCCM guidelines  MONITOR:   Vent status, Labs, Weight trends, Skin, I & O's  REASON FOR ASSESSMENT:   Ventilator    ASSESSMENT:   68 year old male with history of non-insulin-dependent diabetes mellitus, HTN, depression, CKD, hyperlipidemia, neuropathy, intellectual learning difficulty/dementia, COPD and CHF who is admitted with PNA, sepsis, AKI and PE.  Pt sedated and ventilated. OGT in place. Pt noted to have blood in his mouth and coming from the OGT. Plan is to hold on tube feeds at this time per MD. Will plan to initiate tube feeds when able. Pt is at high refeed risk. Per chart, pt is down ~14lbs(8%) over the past year; this is not significant.   Medications reviewed and include: colace, pepcid , lasix , protonix , miralax , ceftriaxone , metronidazole , levophed   Labs reviewed: K 3.8 wnl, BUN 50(H), P 4.8(H), Mg 2.2 wnl Wbc- 12.5(H), Hgb 9.5(L), Hct 28.1(L) Cbgs- 130, 130, 133, 148 x 24 hrs    Patient is currently intubated on ventilator support MV: 13.8 L/min Temp (24hrs), Avg:98.3 F (36.8 C), Min:97.9 F (36.6 C), Max:98.6 F (37 C)  MAP >69mmHg   UOP-   NUTRITION - FOCUSED PHYSICAL EXAM:  Flowsheet Row Most Recent Value  Orbital Region Mild  depletion  Upper Arm Region Moderate depletion  Thoracic and Lumbar Region Moderate depletion  Buccal Region Mild depletion  Temple Region Moderate depletion  Clavicle Bone Region Moderate depletion  Clavicle and Acromion Bone Region Moderate depletion  Scapular Bone Region Moderate depletion  Dorsal Hand Mild depletion  Patellar Region Moderate depletion  Anterior Thigh Region Moderate depletion  Posterior Calf Region Moderate depletion  Edema (RD Assessment) Mild  Hair Reviewed  Eyes Reviewed  Mouth Reviewed  Skin Reviewed  Nails Reviewed   Diet Order:   Diet Order             Diet NPO time specified  Diet effective now                  EDUCATION NEEDS:   No education needs have been identified at this time  Skin:  Skin Assessment: Reviewed RN Assessment (ecchymosis)  Last BM:  4/21- TYPE 6  Height:   Ht Readings from Last 1 Encounters:  08/31/23 5\' 8"  (1.727 m)    Weight:   Wt Readings from Last 1 Encounters:  08/31/23 74.6 kg    Ideal Body Weight:  70 kg  BMI:  Body mass index is 25.01 kg/m.  Estimated Nutritional Needs:   Kcal:  1830kcal/day  Protein:  110-125g/day  Fluid:  1.8-2.1L/day  Torrance Freestone MS, RD, LDN If unable to be reached, please send secure chat to "RD inpatient" available from 8:00a-4:00p daily

## 2023-09-05 NOTE — Plan of Care (Signed)
  Problem: Fluid Volume: Goal: Hemodynamic stability will improve Outcome: Progressing   Problem: Respiratory: Goal: Ability to maintain adequate ventilation will improve Outcome: Progressing   Problem: Clinical Measurements: Goal: Cardiovascular complication will be avoided Outcome: Progressing   Problem: Elimination: Goal: Will not experience complications related to urinary retention Outcome: Progressing   Problem: Pain Managment: Goal: General experience of comfort will improve and/or be controlled Outcome: Progressing   Problem: Safety: Goal: Ability to remain free from injury will improve Outcome: Progressing   Problem: Clinical Measurements: Goal: Ability to maintain a body temperature in the normal range will improve Outcome: Progressing   Problem: Education: Goal: Knowledge of General Education information will improve Description: Including pain rating scale, medication(s)/side effects and non-pharmacologic comfort measures Outcome: Not Progressing   Problem: Health Behavior/Discharge Planning: Goal: Ability to manage health-related needs will improve Outcome: Not Progressing   Problem: Nutrition: Goal: Adequate nutrition will be maintained Outcome: Not Progressing   Problem: Activity: Goal: Ability to tolerate increased activity will improve Outcome: Not Progressing

## 2023-09-05 NOTE — Progress Notes (Signed)
 RN at bedside to assess patient. Oxygenation desat to 80%. RN suctioned patient and gave 02 ven breaths without improvement. NP to bedside to assess patient as well as RT at bedside to bag patient. PRN versed  given to patient and heparin  held at this time due to extensive bleeding with suctioning per NP verbal. RT bagged patient with oxygenation improving to 98% afterward placed back on vent at 100%. Will continue to monitor closely. MD Aleskerov conversing multiple times with DSS in regards to patient care. Awaiting DSS call at this time.

## 2023-09-06 ENCOUNTER — Inpatient Hospital Stay

## 2023-09-06 LAB — RENAL FUNCTION PANEL
Albumin: 1.9 g/dL — ABNORMAL LOW (ref 3.5–5.0)
Anion gap: 9 (ref 5–15)
BUN: 58 mg/dL — ABNORMAL HIGH (ref 8–23)
CO2: 31 mmol/L (ref 22–32)
Calcium: 8.4 mg/dL — ABNORMAL LOW (ref 8.9–10.3)
Chloride: 100 mmol/L (ref 98–111)
Creatinine, Ser: 1.51 mg/dL — ABNORMAL HIGH (ref 0.61–1.24)
GFR, Estimated: 50 mL/min — ABNORMAL LOW (ref >60)
Glucose, Bld: 106 mg/dL — ABNORMAL HIGH (ref 70–99)
Phosphorus: 4.3 mg/dL (ref 2.5–4.6)
Potassium: 3.6 mmol/L (ref 3.5–5.1)
Sodium: 140 mmol/L (ref 135–145)

## 2023-09-06 LAB — MAGNESIUM: Magnesium: 2.2 mg/dL (ref 1.7–2.4)

## 2023-09-06 LAB — CBC
HCT: 28 % — ABNORMAL LOW (ref 39.0–52.0)
Hemoglobin: 8.9 g/dL — ABNORMAL LOW (ref 13.0–17.0)
MCH: 32 pg (ref 26.0–34.0)
MCHC: 31.8 g/dL (ref 30.0–36.0)
MCV: 100.7 fL — ABNORMAL HIGH (ref 80.0–100.0)
Platelets: 149 10*3/uL — ABNORMAL LOW (ref 150–400)
RBC: 2.78 MIL/uL — ABNORMAL LOW (ref 4.22–5.81)
RDW: 16.7 % — ABNORMAL HIGH (ref 11.5–15.5)
WBC: 13.5 10*3/uL — ABNORMAL HIGH (ref 4.0–10.5)
nRBC: 0.1 % (ref 0.0–0.2)

## 2023-09-06 LAB — GLUCOSE, CAPILLARY
Glucose-Capillary: 106 mg/dL — ABNORMAL HIGH (ref 70–99)
Glucose-Capillary: 105 mg/dL — ABNORMAL HIGH (ref 70–99)
Glucose-Capillary: 104 mg/dL — ABNORMAL HIGH (ref 70–99)
Glucose-Capillary: 117 mg/dL — ABNORMAL HIGH (ref 70–99)
Glucose-Capillary: 112 mg/dL — ABNORMAL HIGH (ref 70–99)
Glucose-Capillary: 127 mg/dL — ABNORMAL HIGH (ref 70–99)

## 2023-09-06 MED ORDER — SODIUM CHLORIDE 0.9 % IV SOLN: INTRAVENOUS | Status: DC | PRN

## 2023-09-06 MED ORDER — VITAL AF 1.2 CAL PO LIQD
1000.0000 mL | ORAL | Status: DC
  Administered 2023-09-06 – 2023-09-13 (×8): 1000 mL

## 2023-09-06 MED ORDER — LACTULOSE 10 GM/15ML PO SOLN
30.0000 g | Freq: Once | ORAL | Status: AC
  Administered 2023-09-06: 30 g
  Filled 2023-09-06: qty 60

## 2023-09-06 MED ORDER — FREE WATER
30.0000 mL | Status: DC
  Administered 2023-09-06 – 2023-09-11 (×30): 30 mL

## 2023-09-06 MED ORDER — PROPOFOL 1000 MG/100ML IV EMUL
5.0000 ug/kg/min | INTRAVENOUS | Status: DC
  Administered 2023-09-06: 10 ug/kg/min via INTRAVENOUS
  Administered 2023-09-06: 20 ug/kg/min via INTRAVENOUS
  Administered 2023-09-07: 30 ug/kg/min via INTRAVENOUS
  Administered 2023-09-07 – 2023-09-08 (×2): 20 ug/kg/min via INTRAVENOUS
  Administered 2023-09-08 (×2): 30 ug/kg/min via INTRAVENOUS
  Administered 2023-09-09 (×2): 20 ug/kg/min via INTRAVENOUS
  Administered 2023-09-10: 30 ug/kg/min via INTRAVENOUS
  Administered 2023-09-10 (×2): 20 ug/kg/min via INTRAVENOUS
  Administered 2023-09-11: 30 ug/kg/min via INTRAVENOUS
  Administered 2023-09-11: 40 ug/kg/min via INTRAVENOUS
  Administered 2023-09-11 – 2023-09-12 (×3): 30 ug/kg/min via INTRAVENOUS
  Filled 2023-09-06 (×17): qty 100

## 2023-09-06 MED ORDER — HEPARIN SODIUM (PORCINE) 5000 UNIT/ML IJ SOLN
5000.0000 [IU] | Freq: Three times a day (TID) | INTRAMUSCULAR | Status: DC
  Administered 2023-09-06 – 2023-09-09 (×9): 5000 [IU] via SUBCUTANEOUS
  Filled 2023-09-06 (×9): qty 1

## 2023-09-06 MED ORDER — METRONIDAZOLE 500 MG/100ML IV SOLN
500.0000 mg | Freq: Two times a day (BID) | INTRAVENOUS | Status: DC
  Administered 2023-09-06 – 2023-09-07 (×2): 500 mg via INTRAVENOUS
  Filled 2023-09-06 (×3): qty 100

## 2023-09-06 MED ORDER — SODIUM CHLORIDE 0.9 % IV SOLN
2.0000 g | Freq: Two times a day (BID) | INTRAVENOUS | Status: DC
  Administered 2023-09-06 – 2023-09-08 (×4): 2 g via INTRAVENOUS
  Filled 2023-09-06 (×5): qty 12.5

## 2023-09-06 NOTE — Progress Notes (Signed)
 PULMONOLOGY         Date: 09/06/2023,   MRN# [id number] Paul Vaughan [birth year]     AdmissionWeight: 77 kg                 CurrentWeight: 72.8 kg  Referring provider: Dr Ariel Begun   CHIEF COMPLAINT:    Sepsis with shock   HISTORY OF PRESENT ILLNESS   68 yo M with hx of DM, COPD, dyslipidemia, chronic hypoxemia, cognitive impairment, CHF came in febrile hypoxemic requiring BIPAP. He had viral workup done which was negative, he had blood cultures done which are negative x2d.  Cardiac biomarkers have been elevated. Patient has care giver from DSS and I met with [name] to review care plan. He had speech/swallow evaluation with goal to use thickened pureed nourishment.   09/04/23- patient with increased O2 req now on elevated HFNC settings. High risk for intubation.  Palliative care is following 09/05/23-patient continues to have thick mucus per ETT and blood.  I will reach out to DSS to discuss his goals of care and if remains full scope may need to have EGD and bronchoscopy.  09/06/23- patient on 45% PRVC, blood per ETT has subsided on pressor support.  No Blood per OGT on LIS.  We will start nourishment today.[name]  PAST MEDICAL HISTORY   Past Medical History:  Diagnosis Date   CHF (congestive heart failure) (HCC)    Chronic kidney disease    COPD (chronic obstructive pulmonary disease) (HCC)    Dementia (HCC)      SURGICAL HISTORY   History reviewed. No pertinent surgical history.   FAMILY HISTORY   Family History  Family history unknown: Yes     SOCIAL HISTORY   Social History   Tobacco Use   Smoking status: Every Day    Current packs/day: 1.00    Types: Cigarettes   Smokeless tobacco: Never  Substance Use Topics   Alcohol use: No   Drug use: No     MEDICATIONS    Home Medication:    Current Medication:  Current Facility-Administered Medications:    acetylcysteine  (MUCOMYST ) 20 % nebulizer / oral solution 4 mL, 4 mL, Nebulization,  BID, Jashley Yellin, MD, 4 mL at 09/06/23 0714   cefTRIAXone  (ROCEPHIN ) 2 g in sodium chloride  0.9 % 100 mL IVPB, 2 g, Intravenous, Q24H, Renleigh Ouellet, MD, Stopped at 09/05/23 1811   Chlorhexidine  Gluconate Cloth 2 % PADS 6 each, 6 each, Topical, Q1400, Cox, Amy N, DO, 6 each at 09/05/23 1537   docusate (COLACE) 50 MG/5ML liquid 100 mg, 100 mg, Per Tube, BID, Rust-Chester, Britton L, NP, 100 mg at 09/06/23 1030   fentaNYL  (SUBLIMAZE ) bolus via infusion 25-100 mcg, 25-100 mcg, Intravenous, Q15 min PRN, Rust-Chester, Britton L, NP, 100 mcg at 09/06/23 0915   fentaNYL  in NS 250mL (10mcg/ml) infusion-PREMIX, 0-200 mcg/hr, Intravenous, Continuous, Rust-Chester, Britton L, NP, Last Rate: 10 mL/hr at 09/06/23 1027, 100 mcg/hr at 09/06/23 1027   ipratropium-albuterol  (DUONEB) 0.5-2.5 (3) MG/3ML nebulizer solution 3 mL, 3 mL, Nebulization, TID, Amin, Sumayya, MD, 3 mL at 09/06/23 0714   liver oil-zinc  oxide (DESITIN) 40 % ointment, , Topical, PRN, Amin, Sumayya, MD, 1 Application at 09/01/23 1754   metroNIDAZOLE  (FLAGYL ) IVPB 500 mg, 500 mg, Intravenous, Q12H, Wouk, Haynes Lips, MD, Stopped at 09/06/23 0556   midazolam  (VERSED ) injection 1-2 mg, 1-2 mg, Intravenous, Q1H PRN, Rust-Chester, Britton L, NP, 2 mg at 09/06/23 0742   norepinephrine  (LEVOPHED ) 4mg  in (  0.016 mg/mL) premix infusion, 2-10 mcg/min, Intravenous, Titrated, Rust-Chester, Britton L, NP, Last Rate: 7.5 mL/hr at 09/06/23 1030, 2 mcg/min at 09/06/23 1030   nystatin  (MYCOSTATIN ) 100000 UNIT/ML suspension 500,000 Units, 5 mL, Oral, QID, Amin, Sumayya, MD, 500,000 Units at 09/05/23 2222   Oral care mouth rinse, 15 mL, Mouth Rinse, Q2H, Rust-Chester, Britton L, NP, 15 mL at 09/06/23 1038   Oral care mouth rinse, 15 mL, Mouth Rinse, PRN, Rust-Chester, Jenni Mody L, NP   pantoprazole  (PROTONIX ) injection 40 mg, 40 mg, Intravenous, Q12H, Cherylann Corpus D, NP, 40 mg at 09/06/23 4696   polyethylene glycol (MIRALAX  / GLYCOLAX ) packet 17  g, 17 g, Per Tube, Daily, Rust-Chester, Jenni Mody L, NP, 17 g at 09/06/23 1030   propofol  (DIPRIVAN ) 1000 MG/100ML infusion, 5-80 mcg/kg/min, Intravenous, Titrated, Cherylann Corpus D, NP, Last Rate: 8.74 mL/hr at 09/06/23 1027, 20 mcg/kg/min at 09/06/23 1027    ALLERGIES   Penicillins and Sulfa antibiotics     REVIEW OF SYSTEMS    Review of Systems:  Gen:  Denies  fever, sweats, chills weigh loss  HEENT: Denies blurred vision, double vision, ear pain, eye pain, hearing loss, nose bleeds, sore throat Cardiac:  No dizziness, chest pain or heaviness, chest tightness,edema Resp:   reports dyspnea chronically  Gi: Denies swallowing difficulty, stomach pain, nausea or vomiting, diarrhea, constipation, bowel incontinence Gu:  Denies bladder incontinence, burning urine Ext:   Denies Joint pain, stiffness or swelling Skin: Denies  skin rash, easy bruising or bleeding or hives Endoc:  Denies polyuria, polydipsia , polyphagia or weight change Psych:   Denies depression, insomnia or hallucinations   Other:  All other systems negative   VS: BP (!) 88/54   Pulse (!) 107   Temp 98.8 F (37.1 C) (Axillary)   Resp 20   Ht 5\' 8"  (1.727 m)   Wt 72.8 kg   SpO2 92%   BMI 24.40 kg/m      PHYSICAL EXAM    GENERAL:NAD, no fevers, chills, no weakness no fatigue HEAD: Normocephalic, atraumatic.  EYES: Pupils equal, round, reactive to light. Extraocular muscles intact. No scleral icterus.  MOUTH: Moist mucosal membrane. Dentition intact. No abscess noted.  EAR, NOSE, THROAT: Clear without exudates. No external lesions.  NECK: Supple. No thyromegaly. No nodules. No JVD.  PULMONARY: decreased breath sounds with mild rhonchi worse at bases bilaterally.  CARDIOVASCULAR: S1 and S2. Regular rate and rhythm. No murmurs, rubs, or gallops. No edema. Pedal pulses 2+ bilaterally.  GASTROINTESTINAL: Soft, nontender, nondistended. No masses. Positive bowel sounds. No hepatosplenomegaly.   MUSCULOSKELETAL: No swelling, clubbing, or edema. Range of motion full in all extremities.  NEUROLOGIC: gcs4T SKIN: No ulceration, lesions, rashes, or cyanosis. Skin warm and dry. Turgor intact.  PSYCHIATRIC: Mood, affect within normal limits. The patient is awake, alert and oriented x 3. Insight, judgment intact.       IMAGING   Reviewed CT chest - 09/01/23- multifocal pneumonia and PE  ASSESSMENT/PLAN   Acute hypoxemic respiratory failure     - continue antbitics- patient with multifocal pneumonia - dc cefepime , continue zithromax  start rocephin     Pulmonary embolism     - continue anticoagulation    Pulmonary edema and pleural effusion      - lasix  40 bid    Bibasilar atelectasis     - bed chest pt due to cognitive impairment    - VEST therapy      Bilateral mucus plugging     - mucomyst  20 % 4ml  BID       Critical care provider statement:   Total critical care time: 33 minutes   Performed by: Jaclynn Mast MD   Critical care time was exclusive of separately billable procedures and treating other patients.   Critical care was necessary to treat or prevent imminent or life-threatening deterioration.   Critical care was time spent personally by me on the following activities: development of treatment plan with patient and/or surrogate as well as nursing, discussions with consultants, evaluation of patient's response to treatment, examination of patient, obtaining history from patient or surrogate, ordering and performing treatments and interventions, ordering and review of laboratory studies, ordering and review of radiographic studies, pulse oximetry and re-evaluation of patient's condition.      Jalie Eiland, M.D.  Division of Pulmonary & Critical Care Medicine

## 2023-09-06 NOTE — Progress Notes (Signed)
 Paul Vaughan, pt's guardian at bedside to check patient's status. Inquired about results bronchoscopy, and requested that she be updated if any changes occur this weekend. Contact number: [phone].

## 2023-09-06 NOTE — Progress Notes (Signed)
 PHARMACY CONSULT NOTE - FOLLOW UP  Pharmacy Consult for Electrolyte Monitoring and Replacement   Recent Labs: Potassium (mmol/L)  Date Value  09/06/2023 3.6   Magnesium (mg/dL)  Date Value  08/65/7846 2.2   Calcium  (mg/dL)  Date Value  96/29/5284 8.4 (L)   Albumin  (g/dL)  Date Value  13/24/4010 1.9 (L)   Phosphorus (mg/dL)  Date Value  27/25/3664 4.3   Sodium (mmol/L)  Date Value  09/06/2023 140     Assessment:  68 y.o. male with medical history significant of dementia, CKD stage IIIa, COPD, CHF, who presented to emergency department for chief concerns of respiratory distress. Pharmacy is asked to follow and replace electrolytes while in CCU   Goal of Therapy:  Electrolytes WNL  Plan:  ---no electrolyte replacement warranted for today ---recheck electrolytes in am  Paul Vaughan ,PharmD Clinical Pharmacist 09/06/2023 7:20 AM

## 2023-09-06 NOTE — Plan of Care (Signed)
  Problem: Clinical Measurements: Goal: Diagnostic test results will improve Outcome: Progressing Goal: Signs and symptoms of infection will decrease Outcome: Progressing   Problem: Respiratory: Goal: Ability to maintain adequate ventilation will improve Outcome: Not Progressing   Problem: Education: Goal: Knowledge of General Education information will improve Description: Including pain rating scale, medication(s)/side effects and non-pharmacologic comfort measures Outcome: Not Progressing   Problem: Health Behavior/Discharge Planning: Goal: Ability to manage health-related needs will improve Outcome: Not Progressing   Problem: Clinical Measurements: Goal: Respiratory complications will improve Outcome: Not Progressing

## 2023-09-07 ENCOUNTER — Inpatient Hospital Stay

## 2023-09-07 DIAGNOSIS — A419 Sepsis, unspecified organism: Secondary | ICD-10-CM | POA: Diagnosis not present

## 2023-09-07 DIAGNOSIS — Z515 Encounter for palliative care: Secondary | ICD-10-CM | POA: Diagnosis not present

## 2023-09-07 DIAGNOSIS — I2699 Other pulmonary embolism without acute cor pulmonale: Secondary | ICD-10-CM | POA: Diagnosis not present

## 2023-09-07 DIAGNOSIS — J189 Pneumonia, unspecified organism: Secondary | ICD-10-CM | POA: Diagnosis not present

## 2023-09-07 DIAGNOSIS — E440 Moderate protein-calorie malnutrition: Secondary | ICD-10-CM | POA: Insufficient documentation

## 2023-09-07 LAB — RENAL FUNCTION PANEL
Albumin: 1.9 g/dL — ABNORMAL LOW (ref 3.5–5.0)
Anion gap: 7 (ref 5–15)
BUN: 62 mg/dL — ABNORMAL HIGH (ref 8–23)
CO2: 30 mmol/L (ref 22–32)
Calcium: 8.5 mg/dL — ABNORMAL LOW (ref 8.9–10.3)
Chloride: 105 mmol/L (ref 98–111)
Creatinine, Ser: 1.4 mg/dL — ABNORMAL HIGH (ref 0.61–1.24)
GFR, Estimated: 55 mL/min — ABNORMAL LOW (ref >60)
Glucose, Bld: 135 mg/dL — ABNORMAL HIGH (ref 70–99)
Phosphorus: 3.3 mg/dL (ref 2.5–4.6)
Potassium: 3.3 mmol/L — ABNORMAL LOW (ref 3.5–5.1)
Sodium: 142 mmol/L (ref 135–145)

## 2023-09-07 LAB — TRIGLYCERIDES
Triglycerides: 416 mg/dL — ABNORMAL HIGH (ref <150)
Triglycerides: 403 mg/dL — ABNORMAL HIGH (ref <150)

## 2023-09-07 LAB — CBC
HCT: 28.1 % — ABNORMAL LOW (ref 39.0–52.0)
Hemoglobin: 9.3 g/dL — ABNORMAL LOW (ref 13.0–17.0)
MCH: 32.7 pg (ref 26.0–34.0)
MCHC: 33.1 g/dL (ref 30.0–36.0)
MCV: 98.9 fL (ref 80.0–100.0)
Platelets: 164 10*3/uL (ref 150–400)
RBC: 2.84 MIL/uL — ABNORMAL LOW (ref 4.22–5.81)
RDW: 17.2 % — ABNORMAL HIGH (ref 11.5–15.5)
WBC: 16.6 10*3/uL — ABNORMAL HIGH (ref 4.0–10.5)
nRBC: 0.3 % — ABNORMAL HIGH (ref 0.0–0.2)

## 2023-09-07 LAB — MAGNESIUM: Magnesium: 2.4 mg/dL (ref 1.7–2.4)

## 2023-09-07 LAB — CULTURE, BAL-QUANTITATIVE W GRAM STAIN: Culture: 30000 — AB

## 2023-09-07 LAB — GLUCOSE, CAPILLARY
Glucose-Capillary: 113 mg/dL — ABNORMAL HIGH (ref 70–99)
Glucose-Capillary: 117 mg/dL — ABNORMAL HIGH (ref 70–99)
Glucose-Capillary: 118 mg/dL — ABNORMAL HIGH (ref 70–99)
Glucose-Capillary: 135 mg/dL — ABNORMAL HIGH (ref 70–99)
Glucose-Capillary: 136 mg/dL — ABNORMAL HIGH (ref 70–99)
Glucose-Capillary: 138 mg/dL — ABNORMAL HIGH (ref 70–99)

## 2023-09-07 LAB — CULTURE, RESPIRATORY W GRAM STAIN: Culture: NORMAL

## 2023-09-07 MED ORDER — POTASSIUM CHLORIDE 20 MEQ PO PACK
40.0000 meq | PACK | Freq: Once | ORAL | Status: AC
  Administered 2023-09-07: 40 meq
  Filled 2023-09-07: qty 2

## 2023-09-07 MED ORDER — QUETIAPINE FUMARATE 25 MG PO TABS
50.0000 mg | ORAL_TABLET | ORAL | Status: DC
  Administered 2023-09-08 – 2023-09-18 (×20): 50 mg
  Filled 2023-09-07 (×20): qty 2

## 2023-09-07 MED ORDER — QUETIAPINE FUMARATE 25 MG PO TABS
150.0000 mg | ORAL_TABLET | Freq: Every day | ORAL | Status: DC
  Administered 2023-09-07 – 2023-09-17 (×10): 150 mg
  Filled 2023-09-07 (×10): qty 2

## 2023-09-07 MED ORDER — ESCITALOPRAM OXALATE 10 MG PO TABS
5.0000 mg | ORAL_TABLET | Freq: Every day | ORAL | Status: DC
  Administered 2023-09-07 – 2023-09-17 (×11): 5 mg
  Filled 2023-09-07 (×11): qty 0.5

## 2023-09-07 MED ORDER — SODIUM CHLORIDE 0.9 % IV SOLN
INTRAVENOUS | Status: AC | PRN
  Administered 2023-09-08: 10 mL via INTRAVENOUS

## 2023-09-07 MED ORDER — BISACODYL 10 MG RE SUPP
10.0000 mg | Freq: Every day | RECTAL | Status: DC | PRN
  Administered 2023-09-07: 10 mg via RECTAL
  Filled 2023-09-07: qty 1

## 2023-09-07 MED ORDER — VALPROIC ACID 250 MG/5ML PO SOLN
500.0000 mg | Freq: Three times a day (TID) | ORAL | Status: DC
  Administered 2023-09-07 – 2023-09-18 (×32): 500 mg
  Filled 2023-09-07 (×35): qty 10

## 2023-09-07 MED ORDER — GABAPENTIN 250 MG/5ML PO SOLN
100.0000 mg | Freq: Three times a day (TID) | ORAL | Status: DC
  Administered 2023-09-07 – 2023-09-18 (×31): 100 mg
  Filled 2023-09-07 (×33): qty 2

## 2023-09-07 NOTE — Progress Notes (Signed)
 PHARMACY CONSULT NOTE  Pharmacy Consult for Electrolyte Monitoring and Replacement   Recent Labs: Potassium (mmol/L)  Date Value  09/07/2023 3.3 (L)   Magnesium (mg/dL)  Date Value  16/02/9603 2.4   Calcium  (mg/dL)  Date Value  54/01/8118 8.5 (L)   Albumin  (g/dL)  Date Value  14/78/2956 1.9 (L)   Phosphorus (mg/dL)  Date Value  21/30/8657 3.3   Sodium (mmol/L)  Date Value  09/07/2023 142   Assessment:  68 y.o. male with medical history significant of dementia, CKD stage IIIa, COPD, CHF, who presented to emergency department for chief concerns of respiratory distress. Pharmacy is asked to follow and replace electrolytes while in CCU  Goal of Therapy:  Electrolytes WNL  Plan:  --K 3.3, Kcl 40 mEq per tube x 1 dose --Re-check electrolytes in am  Page Boast 09/07/2023 7:01 AM

## 2023-09-07 NOTE — Plan of Care (Signed)
  Problem: Fluid Volume: Goal: Hemodynamic stability will improve Outcome: Progressing   Problem: Clinical Measurements: Goal: Ability to maintain clinical measurements within normal limits will improve Outcome: Progressing   Problem: Respiratory: Goal: Ability to maintain adequate ventilation will improve Outcome: Not Progressing   Problem: Education: Goal: Knowledge of General Education information will improve Description: Including pain rating scale, medication(s)/side effects and non-pharmacologic comfort measures Outcome: Not Progressing   Problem: Activity: Goal: Risk for activity intolerance will decrease Outcome: Not Progressing

## 2023-09-07 NOTE — Progress Notes (Signed)
 Daily Progress Note   Patient Name: Paul Vaughan       Date: 09/07/2023 DOB: 1955/08/28  Age: 68 y.o. MRN#: [id number] Attending Physician: Erskin Hearing, MD Primary Care Physician: Meri Stammer, NP Admit Date: 08/31/2023  Reason for Consultation/Follow-up: Establishing goals of care  HPI/Brief Hospital Review:  Mr. [name] is a 68 year old male with history of non-insulin-dependent diabetes mellitus, hyperlipidemia, neuropathy, intellectual learning difficulty/dementia, COPD, on baseline 4 L nasal cannula, heart failure preserved ejection fraction, who presents emergency department for chief concerns of respiratory distress noticed by nursing staff.   4/26 remains intubated and sedated Unable to tolerate heparin  due to bleeding, found to have PE, s/p bronch due to bilateral mucus plugging (4/24) Will require second bronchoscopy today  Palliative Medicine consulted for assisting with goals of care conversations.  Subjective: Extensive chart review has been completed prior to meeting patient including labs, vital signs, imaging, progress notes, orders, and available advanced directive documents from current and previous encounters.    Mr. [name] remains intubated and sedated, unable to follow commands. Continues to have desaturation events and thick secretions from ETT tube suctioning, needs repeat bronchoscopy.  Called and spoke with Agricultural consultant. Provided medical updates and discussed events during hospitalization. Obtained consent for bronchoscopy with Photographer. [name] provided consent for procedure. We discussed code status, [name] shares her team has already made contact with family who shared they would be on board with whatever  decisions DSS made. [name] shares at this time she and her team would like to provide more time before making changes to code status. [name] shares she would be open to DNR status if [name] decompensates or if he develops further complications.  Answered and addressed all questions and concerns. PMT to continue to follow for ongoing needs and support.   Palliative Care Assessment & Plan   Assessment/Recommendation/Plan  Time for outcomes Full Code-Full Scope  Care plan was discussed with CCM team and nursing staff  Thank you for allowing the Palliative Medicine Team to assist in the care of this patient.  Total time:  50 minutes  Time spent includes: Detailed review of medical records (labs, imaging, vital signs), medically appropriate exam (mental status, respiratory, cardiac, skin), discussed with treatment team, counseling and educating patient, family and staff, documenting clinical information, medication  management and coordination of care.  [name], DNP, AGNP-C Palliative Medicine   Please contact Palliative Medicine Team phone at [phone] for questions and concerns.

## 2023-09-07 NOTE — Progress Notes (Signed)
 PULMONOLOGY         Date: 09/07/2023,   MRN# [id number] Paul Vaughan [birth year]     AdmissionWeight: 77 kg                 CurrentWeight: 72.7 kg  Referring provider: Dr Ariel Begun   CHIEF COMPLAINT:    Sepsis with shock   HISTORY OF PRESENT ILLNESS   68 yo M with hx of DM, COPD, dyslipidemia, chronic hypoxemia, cognitive impairment, CHF came in febrile hypoxemic requiring BIPAP. He had viral workup done which was negative, he had blood cultures done which are negative x2d.  Cardiac biomarkers have been elevated. Patient has care giver from DSS and I met with [name] to review care plan. He had speech/swallow evaluation with goal to use thickened pureed nourishment.   09/04/23- patient with increased O2 req now on elevated HFNC settings. High risk for intubation.  Palliative care is following 09/05/23-patient continues to have thick mucus per ETT and blood.  I will reach out to DSS to discuss his goals of care and if remains full scope may need to have EGD and bronchoscopy.  09/06/23- patient on 45% PRVC, blood per ETT has subsided on pressor support.  No Blood per OGT on LIS.  We will start nourishment today.. 09/07/23 - patient continues to desaturate to <70% with heavy mucus plugging and thick secretions from ETT.  Plan to bronch today.  We reached approval for [facility] but currently patient is unstable for transfer. Obtained consent for bronchoscopy from [name] - director of DSS.   PAST MEDICAL HISTORY   Past Medical History:  Diagnosis Date   CHF (congestive heart failure) (HCC)    Chronic kidney disease    COPD (chronic obstructive pulmonary disease) (HCC)    Dementia (HCC)      SURGICAL HISTORY   History reviewed. No pertinent surgical history.   FAMILY HISTORY   Family History  Family history unknown: Yes     SOCIAL HISTORY   Social History   Tobacco Use   Smoking status: Every Day    Current packs/day: 1.00    Types:  Cigarettes   Smokeless tobacco: Never  Substance Use Topics   Alcohol use: No   Drug use: No     MEDICATIONS    Home Medication:    Current Medication:  Current Facility-Administered Medications:    0.9 %  sodium chloride  infusion, , Intravenous, PRN, Chakira Jachim, MD, Last Rate: 10 mL/hr at 09/07/23 0639, Infusion Verify at 09/07/23 2130   acetylcysteine  (MUCOMYST ) 20 % nebulizer / oral solution 4 mL, 4 mL, Nebulization, BID, Aniello Christopoulos, MD, 4 mL at 09/07/23 0725   ceFEPIme  (MAXIPIME ) 2 g in sodium chloride  0.9 % 100 mL IVPB, 2 g, Intravenous, Q12H, Porschia Willbanks, MD, Stopped at 09/07/23 0451   Chlorhexidine  Gluconate Cloth 2 % PADS 6 each, 6 each, Topical, Q1400, Cox, Amy N, DO, 6 each at 09/06/23 1217   docusate (COLACE) 50 MG/5ML liquid 100 mg, 100 mg, Per Tube, BID, Rust-Chester, Britton L, NP, 100 mg at 09/07/23 0913   feeding supplement (VITAL AF 1.2 CAL) liquid 1,000 mL, 1,000 mL, Per Tube, Continuous, Alizae Bechtel, MD, Last Rate: 50 mL/hr at 09/07/23 0920, Rate Change at 09/07/23 0920   fentaNYL  (SUBLIMAZE ) bolus via infusion 25-100 mcg, 25-100 mcg, Intravenous, Q15 min PRN, Rust-Chester, Britton L, NP, 100 mcg at 09/07/23 0730   fentaNYL  in NS (87mcg/ml) infusion-PREMIX, 0-200 mcg/hr, Intravenous, Continuous, Rust-Chester,  Gara July, NP, Last Rate: 7.5 mL/hr at 09/07/23 0639, 75 mcg/hr at 09/07/23 0639   free water 30 mL, 30 mL, Per Tube, Q4H, Dandre Sisler, MD, 30 mL at 09/07/23 0535   heparin  injection 5,000 Units, 5,000 Units, Subcutaneous, Q8H, Rufus Cypert, MD, 5,000 Units at 09/07/23 0537   ipratropium-albuterol  (DUONEB) 0.5-2.5 (3) MG/3ML nebulizer solution 3 mL, 3 mL, Nebulization, TID, Amin, Sumayya, MD, 3 mL at 09/07/23 0724   liver oil-zinc  oxide (DESITIN) 40 % ointment, , Topical, PRN, Amin, Sumayya, MD, Given at 09/06/23 1923   metroNIDAZOLE  (FLAGYL ) IVPB 500 mg, 500 mg, Intravenous, Q12H, [name], RPH, Stopped at 09/07/23  5284   midazolam  (VERSED ) injection 1-2 mg, 1-2 mg, Intravenous, Q1H PRN, Rust-Chester, Gara July, NP, 2 mg at 09/06/23 1324   norepinephrine  (LEVOPHED ) 4mg  in (0.016 mg/mL) premix infusion, 2-10 mcg/min, Intravenous, Titrated, Rust-Chester, Gara July, NP, Stopped at 09/06/23 2153   nystatin  (MYCOSTATIN ) 100000 UNIT/ML suspension 500,000 Units, 5 mL, Oral, QID, Amin, Sumayya, MD, 500,000 Units at 09/07/23 0913   Oral care mouth rinse, 15 mL, Mouth Rinse, Q2H, Rust-Chester, Britton L, NP, 15 mL at 09/07/23 0800   Oral care mouth rinse, 15 mL, Mouth Rinse, PRN, Rust-Chester, Jenni Mody L, NP   pantoprazole  (PROTONIX ) injection 40 mg, 40 mg, Intravenous, Q12H, Cherylann Corpus D, NP, 40 mg at 09/07/23 4010   polyethylene glycol (MIRALAX  / GLYCOLAX ) packet 17 g, 17 g, Per Tube, Daily, Rust-Chester, Gara July, NP, 17 g at 09/07/23 2725   propofol  (DIPRIVAN ) 1000 MG/100ML infusion, 5-80 mcg/kg/min, Intravenous, Titrated, Delanna Fears, NP, Last Rate: 8.74 mL/hr at 09/07/23 0639, 20 mcg/kg/min at 09/07/23 3664    ALLERGIES   Penicillins and Sulfa antibiotics     REVIEW OF SYSTEMS    Review of Systems:  Gen:  Denies  fever, sweats, chills weigh loss  HEENT: Denies blurred vision, double vision, ear pain, eye pain, hearing loss, nose bleeds, sore throat Cardiac:  No dizziness, chest pain or heaviness, chest tightness,edema Resp:   reports dyspnea chronically  Gi: Denies swallowing difficulty, stomach pain, nausea or vomiting, diarrhea, constipation, bowel incontinence Gu:  Denies bladder incontinence, burning urine Ext:   Denies Joint pain, stiffness or swelling Skin: Denies  skin rash, easy bruising or bleeding or hives Endoc:  Denies polyuria, polydipsia , polyphagia or weight change Psych:   Denies depression, insomnia or hallucinations   Other:  All other systems negative   VS: BP 113/60   Pulse (!) 119   Temp 98.9 F (37.2 C) (Axillary)   Resp 19   Ht 5\' 8"  (1.727 m)    Wt 72.7 kg   SpO2 93%   BMI 24.37 kg/m      PHYSICAL EXAM    GENERAL:NAD, no fevers, chills, no weakness no fatigue HEAD: Normocephalic, atraumatic.  EYES: Pupils equal, round, reactive to light. Extraocular muscles intact. No scleral icterus.  MOUTH: Moist mucosal membrane. Dentition intact. No abscess noted.  EAR, NOSE, THROAT: Clear without exudates. No external lesions.  NECK: Supple. No thyromegaly. No nodules. No JVD.  PULMONARY: decreased breath sounds with mild rhonchi worse at bases bilaterally.  CARDIOVASCULAR: S1 and S2. Regular rate and rhythm. No murmurs, rubs, or gallops. No edema. Pedal pulses 2+ bilaterally.  GASTROINTESTINAL: Soft, nontender, nondistended. No masses. Positive bowel sounds. No hepatosplenomegaly.  MUSCULOSKELETAL: No swelling, clubbing, or edema. Range of motion full in all extremities.  NEUROLOGIC: gcs4T SKIN: No ulceration, lesions, rashes, or cyanosis. Skin warm and dry. Turgor intact.  PSYCHIATRIC: Mood, affect within normal limits. The patient is awake, alert and oriented x 3. Insight, judgment intact.       IMAGING   Reviewed CT chest - 09/01/23- multifocal pneumonia and PE  ASSESSMENT/PLAN   Acute hypoxemic respiratory failure     - continue antbitics- patient with multifocal pneumonia - patient is PCN allergic and sulfa allergic.  Continue with maxipime    Pulmonary embolism     - continue anticoagulation -Gracey for now due to bleeding of upper airway   Pulmonary edema and pleural effusion      - stopped lasix  due to aki   Bibasilar atelectasis     - bed chest pt due to cognitive impairment    - VEST therapy      Bilateral mucus plugging     - mucomyst  20 % 4ml BID    -s/p bronch with BAL - micro with GPC in pairs from tracheal aspirate but BAL micro is in process      Critical care provider statement:   Total critical care time: 33 minutes   Performed by: Jaclynn Mast MD   Critical care time was exclusive of separately  billable procedures and treating other patients.   Critical care was necessary to treat or prevent imminent or life-threatening deterioration.   Critical care was time spent personally by me on the following activities: development of treatment plan with patient and/or surrogate as well as nursing, discussions with consultants, evaluation of patient's response to treatment, examination of patient, obtaining history from patient or surrogate, ordering and performing treatments and interventions, ordering and review of laboratory studies, ordering and review of radiographic studies, pulse oximetry and re-evaluation of patient's condition.      Tanelle Lanzo, M.D.  Division of Pulmonary & Critical Care Medicine

## 2023-09-07 NOTE — Procedures (Signed)
 PROCEDURE: BRONCHOSCOPY Therapeutic Aspiration of Tracheobronchial Tree and Bronchoalveolar lavage  PROCEDURE DATE: 09/07/2023  TIME:  NAME:  Paul Vaughan  DOB:[birth year]  MRN: [id number] LOC:  IC16A/IC16A-AA    HOSP DAY: @LENGTHOFSTAYDAYS @ CODE STATUS:      Code Status Orders  (From admission, onward)           Start     Ordered   08/31/23 1741  Full code  Continuous       Question:  By:  Answer:  Default: patient does not have capacity for decision making, no surrogate or prior directive available   08/31/23 1741           Code Status History     Date Active Date Inactive Code Status Order ID Comments User Context   08/31/2023 1421 08/31/2023 1740 Full Code [id number]  CoxTrenton Frock, DO ED   07/28/2023 1353 08/19/2023 1706 Full Code [id number]  Avi Body, MD ED   06/30/2021 1239 07/06/2021 1904 Full Code [id number]  Read Camel, MD ED   11/20/2019 0652 12/14/2019 2059 Full Code [id number]  Paul Manuel, MD ED           Indications/Preliminary Diagnosis:   Consent: (Place X beside choice/s below)  The benefits, risks and possible complications of the procedure were        explained to:  ___ patient  ___ patient's family  __x_ other:_DSS director [name]__________  who verbalized understanding and gave:  ___ verbal  ___ written  ___ verbal and written  __x_ telephone  ___ other:________ consent.      Unable to obtain consent; procedure performed on emergent basis.     Other:       PRESEDATION ASSESSMENT: History and Physical has been performed. Patient meds and allergies have been reviewed. Presedation airway examination has been performed and documented. Baseline vital signs, sedation score, oxygenation status, and cardiac rhythm were reviewed. Patient was deemed to be in satisfactory condition to undergo the procedure.      PROCEDURE DETAILS: Timeout performed and correct patient, name, & ID confirmed. Following prep per Pulmonary policy,  appropriate sedation was administered. The Bronchoscope was inserted in to oral cavity with bite block in place. Therapeutic aspiration of Tracheobronchial tree was performed.  Airway exam proceeded with findings, technical procedures, and specimen collection as noted below. At the end of exam the scope was withdrawn without incident. Impression and Plan as noted below.           Airway Prep (Place X beside choice below)   1% Transtracheal Lidocaine  Anesthetization 7 cc   Patient prepped per Bronchoscopy Lab Policy       Insertion Route (Place X beside choice below)   Nasal   Oral  x Endotracheal Tube   Tracheostomy   INTRAPROCEDURE MEDICATIONS:  Sedative/Narcotic Amt Dose   Versed   mg   Fentanyl  gtt mcg  Diprivan  gtt mg       Medication Amt Dose  Medication Amt Dose  Lidocaine  1%  cc  Epinephrine 1:10,000 sol  cc  Xylocaine  4%  cc  Cocaine  cc   TECHNICAL PROCEDURES: (Place X beside choice below)   Procedures  Description    None     Electrocautery     Cryotherapy     Balloon Dilatation     Bronchography     Stent Placement   x  Therapeutic Aspiration Bilateral     Laser/Argon Plasma    Brachytherapy Catheter Placement    Foreign Body Removal  SPECIMENS (Sites): (Place X beside choice below)  Specimens Description   No Specimens Obtained     Washings   x Lavage Right lower lobe   Biopsies    Fine Needle Aspirates    Brushings    Sputum    FINDINGS:     Media Information  Document Information    Media Information  Document Information   Media Information  Document Information     ESTIMATED BLOOD LOSS: none COMPLICATIONS/RESOLUTION: none      IMPRESSION:POST-PROCEDURE DX:   Bilateral mucus plugging treated with therapeutic aspiration of tracheobronchial tree.  BAL done at [facility] sent for microbiology  RECOMMENDATION/PLAN:   Awaiting cultures for gram stain and culture.     Paul Vaughan, M.D.  Pulmonary & Critical  Care Medicine  [facility]

## 2023-09-08 ENCOUNTER — Inpatient Hospital Stay

## 2023-09-08 DIAGNOSIS — Z515 Encounter for palliative care: Secondary | ICD-10-CM | POA: Diagnosis not present

## 2023-09-08 DIAGNOSIS — J189 Pneumonia, unspecified organism: Secondary | ICD-10-CM | POA: Diagnosis not present

## 2023-09-08 DIAGNOSIS — A419 Sepsis, unspecified organism: Secondary | ICD-10-CM | POA: Diagnosis not present

## 2023-09-08 DIAGNOSIS — J9621 Acute and chronic respiratory failure with hypoxia: Secondary | ICD-10-CM | POA: Diagnosis not present

## 2023-09-08 LAB — MAGNESIUM: Magnesium: 2.5 mg/dL — ABNORMAL HIGH (ref 1.7–2.4)

## 2023-09-08 LAB — CBC
HCT: 28.4 % — ABNORMAL LOW (ref 39.0–52.0)
Hemoglobin: 9 g/dL — ABNORMAL LOW (ref 13.0–17.0)
MCH: 32.1 pg (ref 26.0–34.0)
MCHC: 31.7 g/dL (ref 30.0–36.0)
MCV: 101.4 fL — ABNORMAL HIGH (ref 80.0–100.0)
Platelets: 157 10*3/uL (ref 150–400)
RBC: 2.8 MIL/uL — ABNORMAL LOW (ref 4.22–5.81)
RDW: 17.2 % — ABNORMAL HIGH (ref 11.5–15.5)
WBC: 19.2 10*3/uL — ABNORMAL HIGH (ref 4.0–10.5)
nRBC: 0.3 % — ABNORMAL HIGH (ref 0.0–0.2)

## 2023-09-08 LAB — RENAL FUNCTION PANEL
Albumin: 1.8 g/dL — ABNORMAL LOW (ref 3.5–5.0)
Anion gap: 7 (ref 5–15)
BUN: 63 mg/dL — ABNORMAL HIGH (ref 8–23)
CO2: 32 mmol/L (ref 22–32)
Calcium: 8.7 mg/dL — ABNORMAL LOW (ref 8.9–10.3)
Chloride: 106 mmol/L (ref 98–111)
Creatinine, Ser: 1.29 mg/dL — ABNORMAL HIGH (ref 0.61–1.24)
GFR, Estimated: >60 mL/min (ref >60)
Glucose, Bld: 159 mg/dL — ABNORMAL HIGH (ref 70–99)
Phosphorus: 3.2 mg/dL (ref 2.5–4.6)
Potassium: 3.7 mmol/L (ref 3.5–5.1)
Sodium: 145 mmol/L (ref 135–145)

## 2023-09-08 LAB — GLUCOSE, CAPILLARY
Glucose-Capillary: 140 mg/dL — ABNORMAL HIGH (ref 70–99)
Glucose-Capillary: 137 mg/dL — ABNORMAL HIGH (ref 70–99)
Glucose-Capillary: 172 mg/dL — ABNORMAL HIGH (ref 70–99)
Glucose-Capillary: 203 mg/dL — ABNORMAL HIGH (ref 70–99)
Glucose-Capillary: 240 mg/dL — ABNORMAL HIGH (ref 70–99)
Glucose-Capillary: 271 mg/dL — ABNORMAL HIGH (ref 70–99)

## 2023-09-08 LAB — PROCALCITONIN: Procalcitonin: 1.16 ng/mL

## 2023-09-08 MED ORDER — DEXAMETHASONE SODIUM PHOSPHATE 10 MG/ML IJ SOLN
6.0000 mg | Freq: Every day | INTRAMUSCULAR | Status: DC
  Administered 2023-09-08 – 2023-09-09 (×2): 6 mg via INTRAVENOUS
  Filled 2023-09-08 (×2): qty 0.6

## 2023-09-08 MED ORDER — MIDAZOLAM HCL 2 MG/2ML IJ SOLN
2.0000 mg | Freq: Once | INTRAMUSCULAR | Status: AC
  Administered 2023-09-08: 2 mg via INTRAVENOUS

## 2023-09-08 MED ORDER — POTASSIUM CHLORIDE 20 MEQ PO PACK
40.0000 meq | PACK | Freq: Once | ORAL | Status: AC
  Administered 2023-09-08: 40 meq
  Filled 2023-09-08: qty 2

## 2023-09-08 MED ORDER — VECURONIUM BROMIDE 10 MG IV SOLR
10.0000 mg | Freq: Once | INTRAVENOUS | Status: AC
  Administered 2023-09-08: 10 mg via INTRAVENOUS
  Filled 2023-09-08: qty 10

## 2023-09-08 MED ORDER — LACTULOSE 10 GM/15ML PO SOLN
20.0000 g | Freq: Three times a day (TID) | ORAL | Status: AC
  Administered 2023-09-08 (×2): 20 g
  Filled 2023-09-08 (×2): qty 30

## 2023-09-08 NOTE — Progress Notes (Signed)
 PHARMACY CONSULT NOTE  Pharmacy Consult for Electrolyte Monitoring and Replacement   Recent Labs: Potassium (mmol/L)  Date Value  09/08/2023 3.7   Magnesium (mg/dL)  Date Value  16/02/9603 2.5 (H)   Calcium  (mg/dL)  Date Value  54/01/8118 8.7 (L)   Albumin  (g/dL)  Date Value  14/78/2956 1.8 (L)   Phosphorus (mg/dL)  Date Value  21/30/8657 3.2   Sodium (mmol/L)  Date Value  09/08/2023 145   Assessment:  68 y.o. male with medical history significant of dementia, CKD stage IIIa, COPD, CHF, who presented to emergency department for chief concerns of respiratory distress. Pharmacy is asked to follow and replace electrolytes while in CCU  Goal of Therapy:  Electrolytes WNL  Plan:  --K 3.7, Kcl 40 mEq per tube x 1 dose --Re-check electrolytes in am  Page Boast 09/08/2023 7:02 AM

## 2023-09-08 NOTE — Progress Notes (Signed)
 CRITICAL CARE          Date: 09/08/2023,   MRN# [id number] Paul Vaughan 1956/03/27     AdmissionWeight: 77 kg                 CurrentWeight: 73.6 kg  Referring provider: Dr Ariel Begun   CHIEF COMPLAINT:    Sepsis with shock   HISTORY OF PRESENT ILLNESS   68 yo M with hx of DM, COPD, dyslipidemia, chronic hypoxemia, cognitive impairment, CHF came in febrile hypoxemic requiring BIPAP. He had viral workup done which was negative, he had blood cultures done which are negative x2d.  Cardiac biomarkers have been elevated. Patient has care giver from DSS and I met with [name] to review care plan. He had speech/swallow evaluation with goal to use thickened pureed nourishment.   09/04/23- patient with increased O2 req now on elevated HFNC settings. High risk for intubation.  Palliative care is following 09/05/23-patient continues to have thick mucus per ETT and blood.  I will reach out to DSS to discuss his goals of care and if remains full scope may need to have EGD and bronchoscopy.  09/06/23- patient on 45% PRVC, blood per ETT has subsided on pressor support.  No Blood per OGT on LIS.  We will start nourishment today.. 09/07/23 - patient continues to desaturate to <70% with heavy mucus plugging and thick secretions from ETT.  Plan to bronch today.  We reached approval for [facility] but currently patient is unstable for transfer. Obtained consent for bronchoscopy from [name] - director of DSS.  09/08/23- patient on 60% FiO2 PRVC, completed 9d abx.  No BM for 6d with +abd distension, for KUB today.   PAST MEDICAL HISTORY   Past Medical History:  Diagnosis Date   CHF (congestive heart failure) (HCC)    Chronic kidney disease    COPD (chronic obstructive pulmonary disease) (HCC)    Dementia (HCC)      SURGICAL HISTORY   History reviewed. No pertinent surgical history.   FAMILY HISTORY   Family History  Family history unknown: Yes     SOCIAL HISTORY    Social History   Tobacco Use   Smoking status: Every Day    Current packs/day: 1.00    Types: Cigarettes   Smokeless tobacco: Never  Substance Use Topics   Alcohol use: No   Drug use: No     MEDICATIONS    Home Medication:    Current Medication:  Current Facility-Administered Medications:    0.9 %  sodium chloride  infusion, , Intravenous, PRN, Danthony Kendrix, MD, Last Rate: 10 mL/hr at 09/08/23 1029, Infusion Verify at 09/08/23 1029   acetylcysteine  (MUCOMYST ) 20 % nebulizer / oral solution 4 mL, 4 mL, Nebulization, BID, Nyliah Nierenberg, MD, 4 mL at 09/08/23 0725   bisacodyl (DULCOLAX) suppository 10 mg, 10 mg, Rectal, Daily PRN, Lonia Roane, MD, 10 mg at 09/07/23 1300   ceFEPIme  (MAXIPIME ) 2 g in sodium chloride  0.9 % 100 mL IVPB, 2 g, Intravenous, Q12H, Meril Dray, MD, Stopped at 09/08/23 0608   Chlorhexidine  Gluconate Cloth 2 % PADS 6 each, 6 each, Topical, Q1400, Cox, Amy N, DO, 6 each at 09/07/23 1300   docusate (COLACE) 50 MG/5ML liquid 100 mg, 100 mg, Per Tube, BID, Rust-Chester, Britton L, NP, 100 mg at 09/08/23 0810   escitalopram  (LEXAPRO ) tablet 5 mg, 5 mg, Per Tube, QHS, Deloria Brassfield, MD, 5 mg at 09/07/23 2135   feeding supplement (VITAL AF  1.2 CAL) liquid 1,000 mL, 1,000 mL, Per Tube, Continuous, Erskin Hearing, MD, Last Rate: 65 mL/hr at 09/08/23 1029, Infusion Verify at 09/08/23 1029   fentaNYL  (SUBLIMAZE ) bolus via infusion 25-100 mcg, 25-100 mcg, Intravenous, Q15 min PRN, Rust-Chester, Gara July, NP, 50 mcg at 09/08/23 0918   fentaNYL  in NS (43mcg/ml) infusion-PREMIX, 0-200 mcg/hr, Intravenous, Continuous, Rust-Chester, Britton L, NP, Last Rate: 10 mL/hr at 09/08/23 1029, 100 mcg/hr at 09/08/23 1029   free water 30 mL, 30 mL, Per Tube, Q4H, Dario Yono, MD, 30 mL at 09/08/23 0910   gabapentin  (NEURONTIN ) 250 MG/5ML solution 100 mg, 100 mg, Per Tube, Q8H, Danaja Lasota, MD, 100 mg at 09/08/23 0826   heparin  injection 5,000 Units,  5,000 Units, Subcutaneous, Q8H, Aurilla Coulibaly, MD, 5,000 Units at 09/08/23 0538   ipratropium-albuterol  (DUONEB) 0.5-2.5 (3) MG/3ML nebulizer solution 3 mL, 3 mL, Nebulization, TID, Luna Salinas, MD, 3 mL at 09/08/23 0725   liver oil-zinc  oxide (DESITIN) 40 % ointment, , Topical, PRN, Luna Salinas, MD, Given at 09/06/23 1923   midazolam  (VERSED ) injection 1-2 mg, 1-2 mg, Intravenous, Q1H PRN, Rust-Chester, Gara July, NP, 2 mg at 09/08/23 1610   norepinephrine  (LEVOPHED ) 4mg  in (0.016 mg/mL) premix infusion, 2-10 mcg/min, Intravenous, Titrated, Rust-Chester, Britton L, NP, Last Rate: 33.8 mL/hr at 09/08/23 1029, 9 mcg/min at 09/08/23 1029   nystatin  (MYCOSTATIN ) 100000 UNIT/ML suspension 500,000 Units, 5 mL, Oral, QID, Amin, Sumayya, MD, 500,000 Units at 09/08/23 0809   Oral care mouth rinse, 15 mL, Mouth Rinse, Q2H, Rust-Chester, Britton L, NP, 15 mL at 09/08/23 0946   Oral care mouth rinse, 15 mL, Mouth Rinse, PRN, Rust-Chester, Jenni Mody L, NP   pantoprazole  (PROTONIX ) injection 40 mg, 40 mg, Intravenous, Q12H, Cherylann Corpus D, NP, 40 mg at 09/08/23 0810   polyethylene glycol (MIRALAX  / GLYCOLAX ) packet 17 g, 17 g, Per Tube, Daily, Rust-Chester, Jenni Mody L, NP, 17 g at 09/08/23 0810   propofol  (DIPRIVAN ) 1000 MG/100ML infusion, 5-80 mcg/kg/min, Intravenous, Titrated, Cherylann Corpus D, NP, Last Rate: 13.1 mL/hr at 09/08/23 1029, 30 mcg/kg/min at 09/08/23 1029   QUEtiapine  (SEROQUEL ) tablet 150 mg, 150 mg, Per Tube, QHS, Suren Payne, MD, 150 mg at 09/07/23 2135   QUEtiapine  (SEROQUEL ) tablet 50 mg, 50 mg, Per Tube, 2 times per day, Drayden Lukas, MD, 50 mg at 09/08/23 0810   valproic acid (DEPAKENE) 250 MG/5ML solution 500 mg, 500 mg, Per Tube, TID, Jarome Trull, MD, 500 mg at 09/08/23 0809    ALLERGIES   Penicillins and Sulfa antibiotics     REVIEW OF SYSTEMS    Review of Systems:  Gen:  Denies  fever, sweats, chills weigh loss  HEENT: Denies blurred vision, double  vision, ear pain, eye pain, hearing loss, nose bleeds, sore throat Cardiac:  No dizziness, chest pain or heaviness, chest tightness,edema Resp:   reports dyspnea chronically  Gi: Denies swallowing difficulty, stomach pain, nausea or vomiting, diarrhea, constipation, bowel incontinence Gu:  Denies bladder incontinence, burning urine Ext:   Denies Joint pain, stiffness or swelling Skin: Denies  skin rash, easy bruising or bleeding or hives Endoc:  Denies polyuria, polydipsia , polyphagia or weight change Psych:   Denies depression, insomnia or hallucinations   Other:  All other systems negative   VS: BP (!) 101/51   Pulse (!) 113   Temp 99 F (37.2 C) (Axillary)   Resp 20   Ht 5\' 8"  (1.727 m)   Wt 73.6 kg   SpO2 100%  BMI 24.67 kg/m      PHYSICAL EXAM    GENERAL:NAD, no fevers, chills, no weakness no fatigue HEAD: Normocephalic, atraumatic.  EYES: Pupils equal, round, reactive to light. Extraocular muscles intact. No scleral icterus.  MOUTH: Moist mucosal membrane. Dentition intact. No abscess noted.  EAR, NOSE, THROAT: Clear without exudates. No external lesions.  NECK: Supple. No thyromegaly. No nodules. No JVD.  PULMONARY: decreased breath sounds with mild rhonchi worse at bases bilaterally.  CARDIOVASCULAR: S1 and S2. Regular rate and rhythm. No murmurs, rubs, or gallops. No edema. Pedal pulses 2+ bilaterally.  GASTROINTESTINAL: Soft, nontender, nondistended. No masses. Positive bowel sounds. No hepatosplenomegaly.  MUSCULOSKELETAL: No swelling, clubbing, or edema. Range of motion full in all extremities.  NEUROLOGIC: gcs4T SKIN: No ulceration, lesions, rashes, or cyanosis. Skin warm and dry. Turgor intact.  PSYCHIATRIC: Mood, affect within normal limits. The patient is awake, alert and oriented x 3. Insight, judgment intact.       IMAGING   Reviewed CT chest - 09/01/23- multifocal pneumonia and PE  ASSESSMENT/PLAN   Acute hypoxemic respiratory failure     -  continue antbitics- patient with multifocal pneumonia - patient is PCN allergic and sulfa allergic.  Continue with maxipime    Pulmonary embolism     - continue anticoagulation -[place] for now due to bleeding of upper airway   Pulmonary edema and pleural effusion      - stopped lasix  due to aki   Bibasilar atelectasis     - bed chest pt due to cognitive impairment    - VEST therapy      Bilateral mucus plugging     - mucomyst  20 % 4ml BID    -s/p bronch with BAL - micro with GPC in pairs from tracheal aspirate but BAL micro is in process      Critical care provider statement:   Total critical care time: 33 minutes   Performed by: Jaclynn Mast MD   Critical care time was exclusive of separately billable procedures and treating other patients.   Critical care was necessary to treat or prevent imminent or life-threatening deterioration.   Critical care was time spent personally by me on the following activities: development of treatment plan with patient and/or surrogate as well as nursing, discussions with consultants, evaluation of patient's response to treatment, examination of patient, obtaining history from patient or surrogate, ordering and performing treatments and interventions, ordering and review of laboratory studies, ordering and review of radiographic studies, pulse oximetry and re-evaluation of patient's condition.      Domenica Weightman, M.D.  Division of Pulmonary & Critical Care Medicine

## 2023-09-08 NOTE — Progress Notes (Addendum)
 Patient double stacking on vent. Prn Versed  and Fentanyl  administered. NP placed order for 1x vec and STAT chest xray to assess airway while at bedside to assess patient. RT contacted by nursing with advice to increase O2 to 100%. Saturation remains >88%. Ventilator Filter changed. Closely monitoring patient.

## 2023-09-08 NOTE — Progress Notes (Signed)
 Daily Progress Note   Patient Name: Paul Vaughan       Date: 09/08/2023 DOB: August 22, 1955  Age: 68 y.o. MRN#: [id number] Attending Physician: Erskin Hearing, MD Primary Care Physician: Meri Stammer, NP Admit Date: 08/31/2023  Reason for Consultation/Follow-up: Establishing goals of care  HPI/Brief Hospital Review: Paul Vaughan is a 68 year old male with history of non-insulin-dependent diabetes mellitus, hyperlipidemia, neuropathy, intellectual learning difficulty/dementia, COPD, on baseline 4 L nasal cannula, heart failure preserved ejection fraction, who presents emergency department for chief concerns of respiratory distress noticed by nursing staff.    4/26 remains intubated and sedated Unable to tolerate heparin  due to bleeding, found to have PE, s/p bronch due to bilateral mucus plugging (4/24) Will require second bronchoscopy today   Palliative Medicine consulted for assisting with goals of care conversations.  Subjective: Extensive chart review has been completed prior to meeting patient including labs, vital signs, imaging, progress notes, orders, and available advanced directive documents from current and previous encounters.    Visited with Paul Vaughan at his bedside.  He remains intubated and sedated.  Discussed case with ICU team.  Paul Vaughan required suctioning administration of Versed  and paralytic due to vent desynchronous.  ICU team shares their concern for overall poor prognosis.  Attempted to call to Kennieth Peaches, legal guardian.  Received callback from [name] and provided her with updates as discussed with ICU team.  Again encouraged consideration of changing CODE STATUS to DNR due to Mr. Andersons poor long-term prognosis without significant  improvements since being intubated.  [name] verbalizes understanding and is leaning toward DNR status but request time to meet with her team tomorrow before giving definitive answer.  Will have PMT team follow-up with [name] tomorrow regarding CODE STATUS and goals of care.  Care plan was discussed with CCM team and nursing staff  Thank you for allowing the Palliative Medicine Team to assist in the care of this patient.  Total time:  35 minutes  Time spent includes: Detailed review of medical records (labs, imaging, vital signs), medically appropriate exam (mental status, respiratory, cardiac, skin), discussed with treatment team, counseling and educating patient, family and staff, documenting clinical information, medication management and coordination of care.  [name], DNP, AGNP-C Palliative Medicine   Please contact Palliative Medicine Team phone at [phone] for questions and concerns.

## 2023-09-09 ENCOUNTER — Inpatient Hospital Stay

## 2023-09-09 DIAGNOSIS — R6510 Systemic inflammatory response syndrome (SIRS) of non-infectious origin without acute organ dysfunction: Secondary | ICD-10-CM

## 2023-09-09 DIAGNOSIS — J189 Pneumonia, unspecified organism: Secondary | ICD-10-CM | POA: Diagnosis not present

## 2023-09-09 DIAGNOSIS — R042 Hemoptysis: Secondary | ICD-10-CM | POA: Diagnosis not present

## 2023-09-09 DIAGNOSIS — E440 Moderate protein-calorie malnutrition: Secondary | ICD-10-CM

## 2023-09-09 DIAGNOSIS — A419 Sepsis, unspecified organism: Secondary | ICD-10-CM | POA: Diagnosis not present

## 2023-09-09 DIAGNOSIS — J9601 Acute respiratory failure with hypoxia: Secondary | ICD-10-CM

## 2023-09-09 DIAGNOSIS — D62 Acute posthemorrhagic anemia: Secondary | ICD-10-CM

## 2023-09-09 DIAGNOSIS — Z7189 Other specified counseling: Secondary | ICD-10-CM | POA: Diagnosis not present

## 2023-09-09 DIAGNOSIS — J90 Pleural effusion, not elsewhere classified: Secondary | ICD-10-CM | POA: Diagnosis not present

## 2023-09-09 DIAGNOSIS — R6520 Severe sepsis without septic shock: Secondary | ICD-10-CM | POA: Diagnosis not present

## 2023-09-09 DIAGNOSIS — I2699 Other pulmonary embolism without acute cor pulmonale: Secondary | ICD-10-CM | POA: Diagnosis not present

## 2023-09-09 LAB — BLOOD GAS, ARTERIAL
Acid-Base Excess: 7 mmol/L — ABNORMAL HIGH (ref 0.0–2.0)
Acid-Base Excess: 6 mmol/L — ABNORMAL HIGH (ref 0.0–2.0)
Bicarbonate: 37.2 mmol/L — ABNORMAL HIGH (ref 20.0–28.0)
Bicarbonate: 34.7 mmol/L — ABNORMAL HIGH (ref 20.0–28.0)
FIO2: 50 %
FIO2: 50 %
MECHVT: 410 mL
MECHVT: 500 mL
Mechanical Rate: 16
O2 Saturation: 99.3 %
O2 Saturation: 99.1 %
PEEP: 10 cmH2O
PEEP: 10 cmH2O
Patient temperature: 37
Patient temperature: 37
RATE: 16 {breaths}/min
RATE: 20 {breaths}/min
Spontaneous VT: 410 mL
pCO2 arterial: 83 mmHg (ref 32–48)
pCO2 arterial: 69 mmHg (ref 32–48)
pH, Arterial: 7.26 — ABNORMAL LOW (ref 7.35–7.45)
pH, Arterial: 7.31 — ABNORMAL LOW (ref 7.35–7.45)
pO2, Arterial: 147 mmHg — ABNORMAL HIGH (ref 83–108)
pO2, Arterial: 97 mmHg (ref 83–108)

## 2023-09-09 LAB — RENAL FUNCTION PANEL
Albumin: 1.6 g/dL — ABNORMAL LOW (ref 3.5–5.0)
Anion gap: 9 (ref 5–15)
BUN: 63 mg/dL — ABNORMAL HIGH (ref 8–23)
CO2: 32 mmol/L (ref 22–32)
Calcium: 9 mg/dL (ref 8.9–10.3)
Chloride: 104 mmol/L (ref 98–111)
Creatinine, Ser: 1.3 mg/dL — ABNORMAL HIGH (ref 0.61–1.24)
GFR, Estimated: 60 mL/min — ABNORMAL LOW (ref >60)
Glucose, Bld: 267 mg/dL — ABNORMAL HIGH (ref 70–99)
Phosphorus: 3.4 mg/dL (ref 2.5–4.6)
Potassium: 4.8 mmol/L (ref 3.5–5.1)
Sodium: 145 mmol/L (ref 135–145)

## 2023-09-09 LAB — GLUCOSE, CAPILLARY
Glucose-Capillary: 224 mg/dL — ABNORMAL HIGH (ref 70–99)
Glucose-Capillary: 229 mg/dL — ABNORMAL HIGH (ref 70–99)
Glucose-Capillary: 264 mg/dL — ABNORMAL HIGH (ref 70–99)
Glucose-Capillary: 270 mg/dL — ABNORMAL HIGH (ref 70–99)
Glucose-Capillary: 276 mg/dL — ABNORMAL HIGH (ref 70–99)
Glucose-Capillary: 300 mg/dL — ABNORMAL HIGH (ref 70–99)

## 2023-09-09 LAB — CBC
HCT: 26 % — ABNORMAL LOW (ref 39.0–52.0)
Hemoglobin: 8.1 g/dL — ABNORMAL LOW (ref 13.0–17.0)
MCH: 32.1 pg (ref 26.0–34.0)
MCHC: 31.2 g/dL (ref 30.0–36.0)
MCV: 103.2 fL — ABNORMAL HIGH (ref 80.0–100.0)
Platelets: 161 10*3/uL (ref 150–400)
RBC: 2.52 MIL/uL — ABNORMAL LOW (ref 4.22–5.81)
RDW: 17.2 % — ABNORMAL HIGH (ref 11.5–15.5)
WBC: 19.6 10*3/uL — ABNORMAL HIGH (ref 4.0–10.5)
nRBC: 0.3 % — ABNORMAL HIGH (ref 0.0–0.2)

## 2023-09-09 LAB — APTT: aPTT: 104 s — ABNORMAL HIGH (ref 24–36)

## 2023-09-09 LAB — MAGNESIUM: Magnesium: 2.7 mg/dL — ABNORMAL HIGH (ref 1.7–2.4)

## 2023-09-09 LAB — MRSA NEXT GEN BY PCR, NASAL: MRSA by PCR Next Gen: DETECTED — AB

## 2023-09-09 MED ORDER — MUPIROCIN 2 % EX OINT
1.0000 | TOPICAL_OINTMENT | Freq: Two times a day (BID) | CUTANEOUS | Status: AC
  Administered 2023-09-09 – 2023-09-13 (×10): 1 via NASAL
  Filled 2023-09-09: qty 22

## 2023-09-09 MED ORDER — SODIUM CHLORIDE 0.9 % IV SOLN: INTRAVENOUS | Status: AC | PRN

## 2023-09-09 MED ORDER — VANCOMYCIN HCL 1500 MG/300ML IV SOLN
1500.0000 mg | Freq: Once | INTRAVENOUS | Status: AC
  Administered 2023-09-09: 1500 mg via INTRAVENOUS
  Filled 2023-09-09: qty 300

## 2023-09-09 MED ORDER — HEPARIN (PORCINE) 25000 UT/250ML-% IV SOLN
800.0000 [IU]/h | INTRAVENOUS | Status: DC
  Administered 2023-09-09: 950 [IU]/h via INTRAVENOUS
  Filled 2023-09-09: qty 250

## 2023-09-09 MED ORDER — INSULIN GLARGINE-YFGN 100 UNIT/ML ~~LOC~~ SOLN
10.0000 [IU] | Freq: Every day | SUBCUTANEOUS | Status: DC
  Administered 2023-09-09: 10 [IU] via SUBCUTANEOUS
  Filled 2023-09-09: qty 0.1

## 2023-09-09 MED ORDER — ALBUMIN HUMAN 25 % IV SOLN
25.0000 g | Freq: Four times a day (QID) | INTRAVENOUS | Status: AC
Start: 2023-09-09 — End: 2023-09-10
  Administered 2023-09-09 – 2023-09-10 (×2): 25 g via INTRAVENOUS
  Filled 2023-09-09 (×2): qty 100

## 2023-09-09 MED ORDER — INSULIN ASPART 100 UNIT/ML IJ SOLN
0.0000 [IU] | INTRAMUSCULAR | Status: DC
  Administered 2023-09-10 (×2): 4 [IU] via SUBCUTANEOUS
  Administered 2023-09-10: 3 [IU] via SUBCUTANEOUS
  Administered 2023-09-10 (×2): 7 [IU] via SUBCUTANEOUS
  Administered 2023-09-10: 11 [IU] via SUBCUTANEOUS
  Administered 2023-09-11 (×3): 4 [IU] via SUBCUTANEOUS
  Administered 2023-09-11: 3 [IU] via SUBCUTANEOUS
  Administered 2023-09-11 – 2023-09-12 (×3): 4 [IU] via SUBCUTANEOUS
  Administered 2023-09-12: 3 [IU] via SUBCUTANEOUS
  Administered 2023-09-12: 4 [IU] via SUBCUTANEOUS
  Administered 2023-09-12: 3 [IU] via SUBCUTANEOUS
  Administered 2023-09-12: 4 [IU] via SUBCUTANEOUS
  Administered 2023-09-13 (×2): 3 [IU] via SUBCUTANEOUS
  Administered 2023-09-13 (×3): 4 [IU] via SUBCUTANEOUS
  Administered 2023-09-13: 3 [IU] via SUBCUTANEOUS
  Administered 2023-09-14: 4 [IU] via SUBCUTANEOUS
  Administered 2023-09-14: 7 [IU] via SUBCUTANEOUS
  Administered 2023-09-14 (×4): 4 [IU] via SUBCUTANEOUS
  Administered 2023-09-15: 7 [IU] via SUBCUTANEOUS
  Administered 2023-09-15: 4 [IU] via SUBCUTANEOUS
  Administered 2023-09-16 – 2023-09-18 (×6): 3 [IU] via SUBCUTANEOUS
  Filled 2023-09-09 (×37): qty 1

## 2023-09-09 MED ORDER — INSULIN ASPART 100 UNIT/ML IJ SOLN
3.0000 [IU] | Freq: Once | INTRAMUSCULAR | Status: AC
  Administered 2023-09-09: 3 [IU] via SUBCUTANEOUS
  Filled 2023-09-09: qty 1

## 2023-09-09 MED ORDER — SODIUM CHLORIDE 0.9 % IV SOLN
2.0000 g | Freq: Two times a day (BID) | INTRAVENOUS | Status: DC
  Administered 2023-09-09 – 2023-09-10 (×2): 2 g via INTRAVENOUS
  Filled 2023-09-09 (×3): qty 12.5

## 2023-09-09 MED ORDER — HYDROCORTISONE SOD SUC (PF) 100 MG IJ SOLR
50.0000 mg | Freq: Four times a day (QID) | INTRAMUSCULAR | Status: DC
  Administered 2023-09-09 – 2023-09-15 (×23): 50 mg via INTRAVENOUS
  Filled 2023-09-09 (×23): qty 2

## 2023-09-09 MED ORDER — INSULIN ASPART 100 UNIT/ML IJ SOLN
0.0000 [IU] | INTRAMUSCULAR | Status: DC
  Administered 2023-09-09: 8 [IU] via SUBCUTANEOUS
  Administered 2023-09-09 (×2): 5 [IU] via SUBCUTANEOUS
  Administered 2023-09-09 (×2): 8 [IU] via SUBCUTANEOUS
  Filled 2023-09-09 (×5): qty 1

## 2023-09-09 MED ORDER — VANCOMYCIN HCL 1250 MG/250ML IV SOLN
1250.0000 mg | INTRAVENOUS | Status: DC
  Administered 2023-09-10: 1250 mg via INTRAVENOUS
  Filled 2023-09-09 (×2): qty 250

## 2023-09-09 NOTE — Procedures (Signed)
 Central Venous Catheter Insertion Procedure Note  Paul Vaughan  [id number]  [birth year]  Date:09/09/23  Time:11:37 AM   Provider Performing:Hildagarde Holleran   Procedure: Insertion of Non-tunneled Central Venous Catheter(36556) with US  guidance (40981)   Indication(s) Medication administration and Difficult access  Consent Unable to obtain consent due to emergent nature of procedure.  Anesthesia Topical only with 1% lidocaine    Timeout Verified patient identification, verified procedure, site/side was marked, verified correct patient position, special equipment/implants available, medications/allergies/relevant history reviewed, required imaging and test results available.  Sterile Technique Maximal sterile technique including full sterile barrier drape, hand hygiene, sterile gown, sterile gloves, mask, hair covering, sterile ultrasound probe cover (if used).  Procedure Description Area of catheter insertion was cleaned with chlorhexidine  and draped in sterile fashion.  With real-time ultrasound guidance a central venous catheter was placed into the left subclavian vein. I was unable to cannulate the subclavian vein after multiple attempts. I then switched to the left internal jugular vein which was cannulated with one attempt. Nonpulsatile blood flow and easy flushing noted in all ports. Wire was confirmed to be in the vein with US . The catheter was sutured in place and sterile dressing applied.  Complications/Tolerance None; patient tolerated the procedure well. Chest X-ray is ordered to verify placement for internal jugular or subclavian cannulation.   Chest x-ray is not ordered for femoral cannulation.  EBL Minimal  Specimen(s) None  [name], MD [facility] 09/09/2023 11:38 AM

## 2023-09-09 NOTE — Progress Notes (Addendum)
 PHARMACY - ANTICOAGULATION CONSULT NOTE  Pharmacy Consult for heparin  infusioin Indication: pulmonary embolus  Allergies  Allergen Reactions   Penicillins Rash and Swelling    .Has patient had a PCN reaction causing immediate rash, facial/tongue/throat swelling, SOB or lightheadedness with hypotension: Unknown Has patient had a PCN reaction causing severe rash involving mucus membranes or skin necrosis: Unknown Has patient had a PCN reaction that required hospitalization: Unknown Has patient had a PCN reaction occurring within the last 10 years: Unknown If all of the above answers are "NO", then may proceed with Cephalosporin use.    Sulfa Antibiotics Rash and Swelling    Patient Measurements: Height: 5' 7.99" (172.7 cm) Weight: 74.3 kg (163 lb 12.8 oz) IBW/kg (Calculated) : 68.38 HEPARIN  DW (KG): 77  Vital Signs: Temp: 97.4 F (36.3 C) (04/28 1930) Temp Source: Axillary (04/28 1930) BP: 97/57 (04/28 2300) Pulse Rate: 106 (04/28 2300)  Labs: Recent Labs    09/07/23 0341 09/08/23 0218 09/09/23 0309 09/09/23 2209  HGB 9.3* 9.0* 8.1*  --   HCT 28.1* 28.4* 26.0*  --   PLT 164 157 161  --   APTT  --   --   --  104*  CREATININE 1.40* 1.29* 1.30*  --     Estimated Creatinine Clearance: 52.6 mL/min (A) (by C-G formula based on SCr of 1.3 mg/dL (H)).   Medical History: Past Medical History:  Diagnosis Date   CHF (congestive heart failure) (HCC)    Chronic kidney disease    COPD (chronic obstructive pulmonary disease) (HCC)    Dementia (HCC)     Medications:  Scheduled:   acetylcysteine   4 mL Nebulization BID   Chlorhexidine  Gluconate Cloth  6 each Topical Q1400   docusate  100 mg Per Tube BID   escitalopram   5 mg Per Tube QHS   free water  30 mL Per Tube Q4H   gabapentin   100 mg Per Tube Q8H   hydrocortisone sod succinate (SOLU-CORTEF) inj  50 mg Intravenous Q6H   [START ON 09/10/2023] insulin aspart  0-20 Units Subcutaneous Q4H   insulin glargine-yfgn  10  Units Subcutaneous Daily   ipratropium-albuterol   3 mL Nebulization TID   mupirocin ointment  1 Application Nasal BID   nystatin   5 mL Oral QID   mouth rinse  15 mL Mouth Rinse Q2H   pantoprazole  (PROTONIX ) IV  40 mg Intravenous Q12H   polyethylene glycol  17 g Per Tube Daily   QUEtiapine   150 mg Per Tube QHS   QUEtiapine   50 mg Per Tube 2 times per day   valproic acid  500 mg Per Tube TID    Assessment: 68 year old male that presented with respiratory distress. CT angio shows a pulmonary embolus within the right superior pulmonary artery. Pharmacy has been consulted for initiaiton and management of a heparin  infusion. He was transitioned to apixaban  (last dose 09/03/23 2152) but is no longer appropriate to take oral medications.    Goal of Therapy:  Anti-Xa level 0.3-0.5 units/ml aPTT 66 - 85 seconds Monitor platelets by anticoagulation protocol: Yes  04/28 2209 aPTT 104, supratherapeutic   Plan:  ---Decrease heparin  infusion to 800 units/hr (no bolus per MD request) ---Recheck aPTT level at 0800 tomorrow after rate change  ---Will check Anti-Xa level once daily but will use aPTT to guide therapy until Anti-Xa level and aPTT correlate ---CBC daily while on heparin    Paul Vaughan, PharmD, [facility] 09/09/2023 11:40 PM

## 2023-09-09 NOTE — Progress Notes (Signed)
 Pharmacy Antibiotic Note  Paul Vaughan is a 68 y.o. male w/ PMH of DM, HLD, neuropathy, dementia, COPD, CHF, depression admitted on 08/31/2023 with pneumonia.  Pharmacy has been consulted for vancomycin  and cefepime  dosing.  Plan:  1) start cefepime  2 grams IV every 12 hours  2) start vancomycin  1500 mg IV x 1, then 1250 mg IV every 24 hours  Goal AUC 400-550. Expected AUC: 448.1 SCr used: 1.3 mg/dL   Height: 5\' 8"  (172.7 cm) Weight: 74.3 kg (163 lb 12.8 oz) IBW/kg (Calculated) : 68.4  Temp (24hrs), Avg:98.1 F (36.7 C), Min:97.6 F (36.4 C), Max:99.3 F (37.4 C)  Recent Labs  Lab 09/05/23 0218 09/06/23 0544 09/07/23 0341 09/08/23 0218 09/09/23 0309  WBC 12.5* 13.5* 16.6* 19.2* 19.6*  CREATININE 1.14 1.51* 1.40* 1.29* 1.30*    Estimated Creatinine Clearance: 52.6 mL/min (A) (by C-G formula based on SCr of 1.3 mg/dL (H)).    Allergies  Allergen Reactions   Penicillins Rash and Swelling    .Has patient had a PCN reaction causing immediate rash, facial/tongue/throat swelling, SOB or lightheadedness with hypotension: Unknown Has patient had a PCN reaction causing severe rash involving mucus membranes or skin necrosis: Unknown Has patient had a PCN reaction that required hospitalization: Unknown Has patient had a PCN reaction occurring within the last 10 years: Unknown If all of the above answers are "NO", then may proceed with Cephalosporin use.    Sulfa Antibiotics Rash and Swelling    Antimicrobials this admission: 04/19 azithromycin  >> 04/23 04/23 ceftriaxone  >> 04/24 04/23 metronidazole  >> 04/26 04/19 cefepime  >> 04/23  04/28 >> 04/28 vancomycin  >>  Microbiology results: 04/19 BCx: NG final 04/23 Sputum: NRF 04/24 Sputum: NRF 04/26 Sputum: pending  04/28 MRSA PCR: positive  Thank you for allowing pharmacy to be a part of this patient's care.  Paul Vaughan 09/09/2023 12:25 PM

## 2023-09-09 NOTE — Procedures (Signed)
 Arterial Catheter Insertion Procedure Note  Fiona Hogenson  [id number]  [birth year]  Date:09/09/23  Time:12:27 PM    Provider Performing: Delanna Fears    Procedure: Insertion of Arterial Line (40981) with US  guidance (19147)   Indication(s) Blood pressure monitoring and/or need for frequent ABGs  Consent Unable to obtain consent due to emergent nature of procedure.  Anesthesia None   Time Out Verified patient identification, verified procedure, site/side was marked, verified correct patient position, special equipment/implants available, medications/allergies/relevant history reviewed, required imaging and test results available.   Sterile Technique Maximal sterile technique including full sterile barrier drape, hand hygiene, sterile gown, sterile gloves, mask, hair covering, sterile ultrasound probe cover (if used).   Procedure Description Area of catheter insertion was cleaned with chlorhexidine  and draped in sterile fashion. With real-time ultrasound guidance an arterial catheter was placed into the right radial artery.  Appropriate arterial tracings confirmed on monitor.     Complications/Tolerance None; patient tolerated the procedure well.   EBL Minimal   Specimen(s) None   Cherylann Corpus, AGACNP-BC [facility] Prefer epic messenger for cross cover needs If after hours, please call E-link

## 2023-09-09 NOTE — Progress Notes (Signed)
 PHARMACY CONSULT NOTE  Pharmacy Consult for Electrolyte Monitoring and Replacement   Recent Labs: Potassium (mmol/L)  Date Value  09/09/2023 4.8   Magnesium (mg/dL)  Date Value  84/69/6295 2.7 (H)   Calcium  (mg/dL)  Date Value  28/41/3244 9.0   Albumin  (g/dL)  Date Value  05/16/7251 1.6 (L)   Phosphorus (mg/dL)  Date Value  66/44/0347 3.4   Sodium (mmol/L)  Date Value  09/09/2023 145   Assessment:  68 y.o. male with medical history significant of dementia, CKD stage IIIa, COPD, CHF, who presented to emergency department for chief concerns of respiratory distress. Pharmacy is asked to follow and replace electrolytes while in CCU  Goal of Therapy:  Electrolytes WNL  Plan:  --no electrolyte replacement warranted for today --Re-check electrolytes in am  Paul Vaughan 09/09/2023 7:15 AM

## 2023-09-09 NOTE — Inpatient Diabetes Management (Signed)
 Inpatient Diabetes Program Recommendations  AACE/ADA: New Consensus Statement on Inpatient Glycemic Control (2015)  Target Ranges:  Prepandial:   less than 140 mg/dL      Peak postprandial:   less than 180 mg/dL (1-2 hours)      Critically ill patients:  140 - 180 mg/dL   Lab Results  Component Value Date   GLUCAP 270 (H) 09/09/2023    Review of Glycemic Control  Diabetes history: DM2 Outpatient Diabetes medications: Jardiance  10 mg QAM Current orders for Inpatient glycemic control: Semglee 10 units daily, Novolog 0-15 Q4H Vital AF @65 /H  Inpatient Diabetes Program Recommendations:    If TFs continue, add Novolog 3 units Q4H  Continue to follow.  Thank you. Joni Net, RD, LDN, CDCES Inpatient Diabetes Coordinator [phone]

## 2023-09-09 NOTE — Progress Notes (Signed)
 NAME:  Paul Vaughan, MRN:  [id number], DOB:  01-May-1956, LOS: 9 ADMISSION DATE:  08/31/2023, CONSULTATION DATE:  09/02/23 REFERRING MD:  Dr Ariel Begun, CHIEF COMPLAINT:  Severe Sepsis with Septic shock   Brief Pt Description / Synopsis:  68 y.o male with PMHx significant for COPD on baseline 4L [place], HFpEF, intellectual learning difficultly, dementia, HLD, and diabetes mellitus who is admitted with Acute Metabolic Encephalopathy and Acute Hypoxic Respiratory Failure in the setting of Multifocal Pneumonia, Pulmonary Embolism, and Acute Decompensated HFmrEF.  Required intubation and mechanical ventilation.  History of Present Illness:  Paul Vaughan is a 68 year old male with history of non-insulin-dependent diabetes mellitus, hyperlipidemia, neuropathy, intellectual learning difficulty/dementia, COPD, on baseline 4 L nasal cannula, heart failure preserved ejection fraction, who presents emergency department for chief concerns of respiratory distress noticed by nursing staff.   Of note, he was recently admitted from 3/16 till 08/19/2023 when presented with generalized weakness, treated for pneumonia and rhabdo. Patient did had worsening respiratory status during that admission which was stabilizes and he was discharged on 4 L of oxygen to SNF.   ED Course: Initial Vital Signs: temperature of 101.4, respiration rate of 25, heart rate of 130, blood pressure 104/77, SpO2 of 95% on BiPAP  Significant Labs: sodium is 136, potassium 5.0, chloride 100, bicarb 23, BUN 36, serum creatinine 1.39, EGFR 55, nonfasting blood glucose 126, WBC 9.4, hemoglobin 12.4, platelets of 419.BNP was elevated at 486.2.  High sensitive troponin was 14.  Lactic acid was 5.7 and on repeat 1 hour later was 5.9. COVID/influenza A/influenza B/RSV PCR were negative. Blood cultures x 2  Imaging Chest X-ray>>IMPRESSION: Improved aeration at the right apex but new left perihilar and right basilar opacities compared to prior,  suspicious for bilateral pneumonia. CTa Chest>>IMPRESSION: 1. Pulmonary embolus within the right superior pulmonary artery. 2. Patchy airspace opacities bilaterally with consolidative changes posteriorly in the left upper lobe. Findings are compatible with multifocal pneumonia. 3. Mild mediastinal adenopathy, likely reactive. Medications Administered: DuoNebs one-time treatment, acetaminophen  1000 mg IV one-time dose, azithromycin  500 mg IV one-time dose, ceftriaxone  2 g IV one-time dose, LR 2.5 mL liter bolus were given, and vancomycin  per pharmacy   Hospitalists were asked to admit for further workup and treatment.  Please see "Significant Hospital Events" section below for full detailed hospital course.   Pertinent  Medical History   Past Medical History:  Diagnosis Date   CHF (congestive heart failure) (HCC)    Chronic kidney disease    COPD (chronic obstructive pulmonary disease) (HCC)    Dementia (HCC)     Micro Data:  4/19: SARS-CoV-2/flu/RSV PCR>> negative 4/19: Blood culture x 2>> no growth 4/19: MRSA PCR>>Negative  4/20: RVP>> negative 4/20: HIV>>nonreactive 4/20: Strep pneumo and Legionella urine antigens>> negative 4/23: Tracheal aspirate>> normal respiratory flora 4/24: BAL>> normal respiratory flora 4/26: BAL>> normal respiratory flora 4/28: MRSA PCR +  Antimicrobials:   Anti-infectives (From admission, onward)    Start     Dose/Rate Route Frequency Ordered Stop   09/10/23 0600  vancomycin  (VANCOREADY) IVPB 1250 mg/250 mL        1,250 mg 166.7 mL/hr over 90 Minutes Intravenous Every 24 hours 09/09/23 1336     09/09/23 1400  vancomycin  (VANCOREADY) IVPB 1500 mg/300 mL        1,500 mg 150 mL/hr over 120 Minutes Intravenous  Once 09/09/23 1226 09/09/23 1537   09/09/23 1200  ceFEPIme  (MAXIPIME ) 2 g in sodium chloride  0.9 % 100 mL IVPB  2 g 200 mL/hr over 30 Minutes Intravenous Every 12 hours 09/09/23 1043     09/06/23 1800  metroNIDAZOLE  (FLAGYL )  IVPB 500 mg  Status:  Discontinued        500 mg 100 mL/hr over 60 Minutes Intravenous Every 12 hours 09/06/23 1224 09/07/23 1046   09/06/23 1700  ceFEPIme  (MAXIPIME ) 2 g in sodium chloride  0.9 % 100 mL IVPB  Status:  Discontinued        2 g 200 mL/hr over 30 Minutes Intravenous Every 12 hours 09/06/23 1225 09/08/23 1040   09/04/23 1630  metroNIDAZOLE  (FLAGYL ) IVPB 500 mg  Status:  Discontinued        500 mg 100 mL/hr over 60 Minutes Intravenous Every 12 hours 09/04/23 1542 09/06/23 1209   09/03/23 1830  cefTRIAXone  (ROCEPHIN ) 2 g in sodium chloride  0.9 % 100 mL IVPB  Status:  Discontinued        2 g 200 mL/hr over 30 Minutes Intravenous Every 24 hours 09/03/23 1636 09/06/23 1209   09/01/23 1200  cefTRIAXone  (ROCEPHIN ) 2 g in sodium chloride  0.9 % 100 mL IVPB  Status:  Discontinued        2 g 200 mL/hr over 30 Minutes Intravenous Every 24 hours 08/31/23 1421 08/31/23 1427   09/01/23 1200  azithromycin  (ZITHROMAX ) 500 mg in sodium chloride  0.9 % 250 mL IVPB        500 mg 250 mL/hr over 60 Minutes Intravenous Every 24 hours 08/31/23 1421 09/04/23 1443   09/01/23 1000  ceFEPIme  (MAXIPIME ) 2 g in sodium chloride  0.9 % 100 mL IVPB  Status:  Discontinued        2 g 200 mL/hr over 30 Minutes Intravenous Every 8 hours 09/01/23 0841 09/03/23 1633   09/01/23 0000  ceFEPIme  (MAXIPIME ) 2 g in sodium chloride  0.9 % 100 mL IVPB  Status:  Discontinued        2 g 200 mL/hr over 30 Minutes Intravenous Every 12 hours 08/31/23 1444 09/01/23 0841   08/31/23 1530  vancomycin  (VANCOREADY) IVPB 750 mg/150 mL        750 mg 150 mL/hr over 60 Minutes Intravenous  Once 08/31/23 1441 08/31/23 1700   08/31/23 1445  vancomycin  variable dose per unstable renal function (pharmacist dosing)  Status:  Discontinued         Does not apply See admin instructions 08/31/23 1445 09/01/23 0805   08/31/23 1145  vancomycin  (VANCOCIN ) IVPB 1000 mg/200 mL premix        1,000 mg 200 mL/hr over 60 Minutes Intravenous  Once 08/31/23  1140 08/31/23 1509   08/31/23 1145  ceFEPIme  (MAXIPIME ) 2 g in sodium chloride  0.9 % 100 mL IVPB        2 g 200 mL/hr over 30 Minutes Intravenous  Once 08/31/23 1140 08/31/23 1248   08/31/23 1145  azithromycin  (ZITHROMAX ) 500 mg in sodium chloride  0.9 % 250 mL IVPB        500 mg 250 mL/hr over 60 Minutes Intravenous  Once 08/31/23 1140 08/31/23 1311       Significant Hospital Events: Including procedures, antibiotic start and stop dates in addition to other pertinent events   4/20: Afebrile with mild tachycardia, remained on BiPAP.  Patient recently treated for multifocal pneumonia.  Also noted significantly elevated CK meeting criteria for rhabdomyolysis with no history of fall.  CK improved with IV fluid, seems like having chronic intermittently elevated CK. MRSA PCR negative so discontinuing vancomycin .  CTA chest was done due to  the significant hypoxia, had a small PE and right upper lobe-started on heparin  infusion.  Did show multifocal pneumonia.  Procalcitonin significantly elevated at 15.77.  Ordered expanded respiratory panel, strep pneumo and Legionella antigen. 4/21: Vital stable but remained on BiPAP.  RVP negative, strep pneumo negative, lactic acidosis resolved.  Labs seems stable.  Patient becoming tachypneic and requiring up to 15 L of oxygen when trying to wean off from BiPAP.  PCCM was also consulted.  Echocardiogram done-pending results 09/04/23- patient with increased O2 req now on elevated HFNC settings. High risk for intubation.  Palliative care is following 09/05/23-patient continues to have thick mucus per ETT and blood.  I will reach out to DSS to discuss his goals of care and if remains full scope may need to have EGD and bronchoscopy.  09/06/23- patient on 45% PRVC, blood per ETT has subsided on pressor support.  No Blood per OGT on LIS.  We will start nourishment today.. 09/07/23 - patient continues to desaturate to <70% with heavy mucus plugging and thick secretions from  ETT.  Plan to bronch today.  We reached approval for [facility] but currently patient is unstable for transfer. Obtained consent for bronchoscopy from [name] - director of DSS.  09/08/23- patient on 60% FiO2 PRVC, completed 9d abx.  No BM for 6d with +abd distension, for KUB today.  09/09/23- No significant events noted overnight.  Remains on high vent support (50% FiO2 & 10 PEEP). Requiring Levophed  (currently 5 mcg).  Given lack of clinical improvement with persistent leukocytosis, BAL cultures pending, will restart Cefepime  and start Vancomycin  as MRSA PCR is +.  No further reports of bleeding, plan to restart Heparin  gtt.  Interim History / Subjective:  As outlined above under "Significant Hospital Events" section  Objective   Blood pressure (!) 116/58, pulse (!) 110, temperature 99.3 F (37.4 C), temperature source Axillary, resp. rate 15, height 5' 7.99" (1.727 m), weight 74.3 kg, SpO2 (!) 87%.    Vent Mode: PRVC FiO2 (%):  [50 %-80 %] 50 % Set Rate:  [20 bmp] 20 bmp Vt Set:  [500 mL] 500 mL PEEP:  [10 cmH20] 10 cmH20 Plateau Pressure:  [25 cmH20] 25 cmH20   Intake/Output Summary (Last 24 hours) at 09/09/2023 0932 Last data filed at 09/09/2023 3557 Gross per 24 hour  Intake 3103.86 ml  Output 1125 ml  Net 1978.86 ml   Filed Weights   09/07/23 0423 09/08/23 0500 09/09/23 0500  Weight: 72.7 kg 73.6 kg 74.3 kg    Examination: General: Acute on chronically ill-appearing male, laying in bed, intubated and sedated, no acute distress HENT: Atraumatic, normocephalic, neck supple, no JVD, orally intubated Lungs: Coarse breath sounds throughout, even, nonlabored, occasionally overbreathing the vent Cardiovascular: Tachycardia, regular rhythm, S1-S2, no murmurs, rubs, or gallops Abdomen: Soft, nontender, nondistended, no guarding rebound tenderness, bowel sounds positive x 4 Extremities: Normal bulk and tone, no deformities, trace edema bilateral lower extremities, no  cyanosis Neuro: Sedated, withdraws from pain, unable to follow commands, pupils PERRLA GU: Foley catheter in place draining yellow urine  Resolved Hospital Problem list     Assessment & Plan:   #Acute Hypoxic Respiratory Failure due to ... #Multifocal Pneumonia ~ Aspiration #Pulmonary Embolism #Pulmonary edema & Pleural effusion #Mucus plugging PMHx: COPD on baseline 4L [place] -CTa Chest on 4/20 with PE in the right superior pulmonary artery, along with patchy airspace opacities bilaterally with consolidate changes in the LUL consistent with multifocal pneumonia -Full vent support, implement lung  protective strategies -Plateau pressures less than 30 cm H20 -Wean FiO2 & PEEP as tolerated to maintain O2 sats >92% -Follow intermittent Chest X-ray & ABG as needed -Spontaneous Breathing Trials when respiratory parameters met and mental status permits -Implement VAP Bundle -Bronchodilators -IV Steroids -ABX as above -Diuresis as BP and renal function permits ~ currently holding due to AKI and vasopressors requirements -Status post Bronchoscopy x2 -Discussed with Dr. Darnelle Elders, will Resume Heparin  gtt on 4/28 (prior hemoptysis while on Heparin )  #Hypotension: Septic shock +/- sedation related #Acute Decompensated HFmrEF Echocardiogram 09/02/23: LVEF 45-50%, mild LVH, indeterminate diastolic parameters, RV not well visualized -Continuous cardiac monitoring -Maintain MAP >65 -Vasopressors as needed to maintain MAP goal -Lactic acid has normalized -HS Troponin negative x2 ( 14 ~ 12) -Diuresis as BP and renal function permits ~ currently holding due to AKI and vasopressor requirements  #Meets SIRS Criteria (HR >90, WBC 19.6) #Sepsis due to Multifocal Pneumonia -Monitor fever curve -Trend WBC's & Procalcitonin -Follow cultures as above -Completed course of Azithromycin  & Ceftriaxone /Cefepime   ~ will start Cefepime  and Vancomycin  on 4/28 due to persistent Leukocytosis and lack of clinical  improvement  #AKI -Monitor I&O's / urinary output -Follow BMP -Ensure adequate renal perfusion -Avoid nephrotoxic agents as able -Replace electrolytes as indicated ~ Pharmacy following for assistance with electrolyte replacement  #Acute Blood Loss Anemia due to Hemoptysis -Monitor for S/Sx of bleeding -Trend CBC -Heparin  SQ for VTE Prophylaxis ~ plan to resume Heparin  gtt for [facility] for PE on 4/28 -Transfuse for Hgb <7  #Diabetes Mellitus with Hyperglycemia -CBG's q4h; Target range of 140 to 180 -SSI -Follow ICU Hypo/Hyperglycemia protocol  #Acute Metabolic Encephalopathy #Sedation needs in setting of mechanical ventilation -Treatment of sepsis and metabolic derangements as outlined above -Maintain a RASS goal of 0 to -1 -FEntanyl  and Propofol  as needed to maintain RASS goal -Avoid sedating medications as able -Daily wake up assessment         Pt is critically ill with multiorgan failure. Prognosis is guarded, high risk for further decompensation, cardiac arrest, and death.  Given current critical illness superimposed on multiple chronic co-morbidities and advanced age, overall long term prognosis is poor.  He DNR status.  Pt has had recent admission for aspiration and with concern for aspiration this admission.  This may represent a new pattern for the pt, and would advise against Trach/PEG.  Palliative Care following to assist with GOC discussions.   Best Practice (right click and "Reselect all SmartList Selections" daily)   Diet/type: tubefeeds and NPO DVT prophylaxis: systemic heparin  GI prophylaxis: PPI Lines: Central line and yes and it is still needed Foley:  Yes, and it is still needed Code Status:  DNR Last date of multidisciplinary goals of care discussion [4/28]    Labs   CBC: Recent Labs  Lab 09/05/23 0218 09/06/23 0544 09/07/23 0341 09/08/23 0218 09/09/23 0309  WBC 12.5* 13.5* 16.6* 19.2* 19.6*  HGB 9.5* 8.9* 9.3* 9.0* 8.1*  HCT 28.1* 28.0* 28.1*  28.4* 26.0*  MCV 94.9 100.7* 98.9 101.4* 103.2*  PLT 171 149* 164 157 161    Basic Metabolic Panel: Recent Labs  Lab 09/05/23 0218 09/06/23 0544 09/07/23 0341 09/08/23 0218 09/09/23 0309  NA 141 140 142 145 145  K 3.8 3.6 3.3* 3.7 4.8  CL 99 100 105 106 104  CO2 30 31 30  32 32  GLUCOSE 134* 106* 135* 159* 267*  BUN 50* 58* 62* 63* 63*  CREATININE 1.14 1.51* 1.40* 1.29* 1.30*  CALCIUM  8.8*  8.4* 8.5* 8.7* 9.0  MG 2.2 2.2 2.4 2.5* 2.7*  PHOS 4.8* 4.3 3.3 3.2 3.4   GFR: Estimated Creatinine Clearance: 52.6 mL/min (A) (by C-G formula based on SCr of 1.3 mg/dL (H)). Recent Labs  Lab 09/02/23 1048 09/04/23 0536 09/06/23 0544 09/07/23 0341 09/08/23 0218 09/09/23 0309  PROCALCITON 6.95  --   --   --  1.16  --   WBC  --    < > 13.5* 16.6* 19.2* 19.6*   < > = values in this interval not displayed.    Liver Function Tests: Recent Labs  Lab 09/06/23 0544 09/07/23 0341 09/08/23 0218 09/09/23 0309  ALBUMIN  1.9* 1.9* 1.8* 1.6*   No results for input(s): "LIPASE", "AMYLASE" in the last 168 hours. No results for input(s): "AMMONIA" in the last 168 hours.  ABG    Component Value Date/Time   PHART 7.41 09/04/2023 2016   PCO2ART 58 (H) 09/04/2023 2016   PO2ART 210 (H) 09/04/2023 2016   HCO3 36.8 (H) 09/04/2023 2016   ACIDBASEDEF 0.7 08/31/2023 1709   O2SAT 99.7 09/04/2023 2016     Coagulation Profile: No results for input(s): "INR", "PROTIME" in the last 168 hours.  Cardiac Enzymes: No results for input(s): "CKTOTAL", "CKMB", "CKMBINDEX", "TROPONINI" in the last 168 hours.  HbA1C: No results found for: "HGBA1C"  CBG: Recent Labs  Lab 09/08/23 1535 09/08/23 1922 09/08/23 2322 09/09/23 0400 09/09/23 0732  GLUCAP 203* 240* 271* 264* 224*    Review of Systems:   Unable to assess due to AMS/intubation/sedation/critical illness   Past Medical History:  He,  has a past medical history of CHF (congestive heart failure) (HCC), Chronic kidney disease, COPD  (chronic obstructive pulmonary disease) (HCC), and Dementia (HCC).   Surgical History:  History reviewed. No pertinent surgical history.   Social History:   reports that he has been smoking cigarettes. He has never used smokeless tobacco. He reports that he does not drink alcohol and does not use drugs.   Family History:  His Family history is unknown by patient.   Allergies Allergies  Allergen Reactions   Penicillins Rash and Swelling    .Has patient had a PCN reaction causing immediate rash, facial/tongue/throat swelling, SOB or lightheadedness with hypotension: Unknown Has patient had a PCN reaction causing severe rash involving mucus membranes or skin necrosis: Unknown Has patient had a PCN reaction that required hospitalization: Unknown Has patient had a PCN reaction occurring within the last 10 years: Unknown If all of the above answers are "NO", then may proceed with Cephalosporin use.    Sulfa Antibiotics Rash and Swelling     Home Medications  Prior to Admission medications   Medication Sig Start Date End Date Taking? Authorizing Provider  albuterol  (PROVENTIL ) (2.5 MG/3ML) 0.083% nebulizer solution Take 2.5 mg by nebulization QID.   Yes [provider]  albuterol  (VENTOLIN  HFA) 108 (90 Base) MCG/ACT inhaler Inhale 2 puffs into the lungs every 6 (six) hours as needed for wheezing or shortness of breath.   Yes [provider]  aspirin  EC 81 MG tablet Take 81 mg by mouth in the morning. Swallow whole.   Yes [provider]  divalproex  (DEPAKOTE  ER) 500 MG 24 hr tablet 1 tablet (500 mg) every morning and 2 tablets (1000 mg) at bedtime 08/19/23  Yes Alexander, Natalie, DO  escitalopram  (LEXAPRO ) 5 MG tablet Take 5 mg by mouth at bedtime. 06/05/21  Yes [provider]  ferrous sulfate ER (IRON SLOW RELEASE) 142 (45 Fe)  MG TBCR tablet Take 45 mg by mouth in the morning.   Yes [provider]  folic acid  (FOLVITE ) 1 MG tablet Take 1 mg by  mouth in the morning.   Yes [provider]  gabapentin  (NEURONTIN ) 100 MG capsule Take 100 mg by mouth 3 (three) times daily. 06/05/21  Yes [provider]  JARDIANCE  10 MG TABS tablet Take 10 mg by mouth in the morning.   Yes [provider]  Multiple Vitamin (MULTIVITAMIN WITH MINERALS) TABS tablet Take 1 tablet by mouth daily. 08/19/23  Yes Alexander, Natalie, DO  naloxone [facility]) nasal spray 4 mg/0.1 mL Place 1 spray into the nose once.   Yes [provider]  ondansetron  (ZOFRAN ) 4 MG tablet Take 1 tablet (4 mg total) by mouth every 6 (six) hours as needed for nausea. 08/19/23  Yes Alexander, Natalie, DO  polyethylene glycol (MIRALAX  / GLYCOLAX ) 17 g packet Take 17 g by mouth daily as needed for mild constipation. 08/19/23  Yes Alexander, Natalie, DO  PRALUENT 150 MG/ML SOAJ Inject 150 mg into the skin as directed. Every 2 weeks in the morning   Yes [provider]  pyridOXINE  (VITAMIN B-6) 100 MG tablet Take 100 mg by mouth at bedtime.   Yes [provider]  QUEtiapine  (SEROQUEL ) 100 MG tablet Take 150 mg by mouth at bedtime. 06/05/21  Yes [provider]  QUEtiapine  (SEROQUEL ) 50 MG tablet Take 50 mg by mouth 2 (two) times daily. 50 mg in the morning (2) 50 mg at 2 pm 06/05/21  Yes [provider]  rosuvastatin  (CRESTOR ) 20 MG tablet Take 20 mg by mouth at bedtime. 06/05/21  Yes [provider]  thiamine  100 MG tablet Take 100 mg by mouth in the morning.   Yes [provider]  feeding supplement (ENSURE ENLIVE / ENSURE PLUS) LIQD Take 237 mLs by mouth 3 (three) times daily between meals. 08/19/23   Alexander, Natalie, DO     Critical care time: 40 minutes     Cherylann Corpus, AGACNP-BC [facility] Prefer epic messenger for cross cover needs If after hours, please call E-link

## 2023-09-09 NOTE — Progress Notes (Signed)
 Daily Progress Note   Patient Name: Paul Vaughan       Date: 09/09/2023 DOB: [birth year]  Age: 68 y.o. MRN#: [id number] Attending Physician: [name], MD Primary Care Physician: Meri Stammer, NP Admit Date: 08/31/2023  Reason for Consultation/Follow-up: Establishing goals of care  Subjective: Notes and labs reviewed.  In to see patient.  He is currently resting in bed on ventilator support.  No family at bedside.  Updates provided by CCM.  Called to speak with patient's legal guardian [name].    We discussed his diagnoses, prognosis, GOC, EOL wishes disposition and options. She discusses the updates that she has received.  A detailed discussion was had today regarding advanced directives.  Concepts specific to code status, artifical feeding and hydration, IV antibiotics and rehospitalization were discussed.  The difference between an aggressive medical intervention path and a comfort care path was discussed.  Values and goals of care important to patient and family were attempted to be elicited.  Discussed limitations of medical interventions to prolong quality of life in some situations and discussed the concept of human mortality.  Legal guardian Corbett Desanctis states she has spoken with her team and would like a DO NOT RESUSCITATE status.  She would like to continue intubation at this time.  She would not want a feeding tube.  She would like to provide a bit more time for outcomes.  I completed a MOST form today electronically through [employer], with patient's legal guardian [name].  Each section of options on the form were reviewed and any questions were answered as needed.The guardian outlined their wishes for the following treatment decisions:  Cardiopulmonary  Resuscitation: Do Not Attempt Resuscitation (DNR/No CPR)  Medical Interventions: Full Scope of Treatment: Use intubation, advanced airway interventions, mechanical ventilation, cardioversion as indicated, medical treatment, IV fluids, etc, also provide comfort measures. Transfer to the hospital if indicated  Antibiotics: Antibiotics if indicated  IV Fluids: IV fluids if indicated  Feeding Tube: No feeding tube     Length of Stay: 9  Current Medications: Scheduled Meds:   acetylcysteine   4 mL Nebulization BID   Chlorhexidine  Gluconate Cloth  6 each Topical Q1400   docusate  100 mg Per Tube BID   escitalopram   5 mg Per Tube QHS   free water  30 mL Per Tube  Q4H   gabapentin   100 mg Per Tube Q8H   hydrocortisone sod succinate (SOLU-CORTEF) inj  50 mg Intravenous Q6H   insulin aspart  0-15 Units Subcutaneous Q4H   insulin glargine-yfgn  10 Units Subcutaneous Daily   ipratropium-albuterol   3 mL Nebulization TID   mupirocin ointment  1 Application Nasal BID   nystatin   5 mL Oral QID   mouth rinse  15 mL Mouth Rinse Q2H   pantoprazole  (PROTONIX ) IV  40 mg Intravenous Q12H   polyethylene glycol  17 g Per Tube Daily   QUEtiapine   150 mg Per Tube QHS   QUEtiapine   50 mg Per Tube 2 times per day   valproic acid  500 mg Per Tube TID    Continuous Infusions:  sodium chloride  Stopped (09/09/23 1337)   ceFEPime  (MAXIPIME ) IV Stopped (09/09/23 1310)   feeding supplement (VITAL AF 1.2 CAL) 65 mL/hr at 09/09/23 1515   fentaNYL  infusion INTRAVENOUS 100 mcg/hr (09/09/23 1515)   heparin  950 Units/hr (09/09/23 1515)   norepinephrine  (LEVOPHED ) Adult infusion 5 mcg/min (09/09/23 1515)   propofol  (DIPRIVAN ) infusion 20 mcg/kg/min (09/09/23 1515)   [START ON 09/10/2023] vancomycin      vancomycin  150 mL/hr at 09/09/23 1515    PRN Meds: sodium chloride , bisacodyl, fentaNYL , liver oil-zinc  oxide, midazolam , mouth rinse  Physical Exam Constitutional:      Comments: Eyes closed.   Pulmonary:      Comments: On ventilator.  Skin:    General: Skin is warm and dry.             Vital Signs: BP 111/64   Pulse (!) 116   Temp 99 F (37.2 C) (Axillary)   Resp 15   Ht 5\' 8"  (1.727 m)   Wt 74.3 kg   SpO2 95%   BMI 24.91 kg/m  SpO2: SpO2: 95 % O2 Device: O2 Device: Ventilator O2 Flow Rate: O2 Flow Rate (L/min): 50 L/min  Intake/output summary:  Intake/Output Summary (Last 24 hours) at 09/09/2023 1525 Last data filed at 09/09/2023 1515 Gross per 24 hour  Intake 3665.93 ml  Output 1565 ml  Net 2100.93 ml   LBM: Last BM Date : 09/09/23 Baseline Weight: Weight: 77 kg Most recent weight: Weight: 74.3 kg   Patient Active Problem List   Diagnosis Date Noted   Malnutrition of moderate degree 09/07/2023   Acute pulmonary embolism (HCC) 09/01/2023   Severe sepsis with acute organ dysfunction (HCC) 08/31/2023   Acute on chronic hypoxic respiratory failure (HCC) 08/31/2023   Multifocal pneumonia 07/29/2023   Chronic diastolic CHF (congestive heart failure) (HCC) 07/29/2023   Rhabdomyolysis 07/29/2023   Acute hypoxic respiratory failure (HCC) 07/28/2023   Elevated LFTs 07/28/2023   Thrombocytopenia (HCC) 07/01/2021   Anemia of chronic disease 07/01/2021   AKI (acute kidney injury) (HCC) 07/01/2021   Severe sepsis (HCC) 06/30/2021   Acute hypoxemic respiratory failure due to COVID-19 (HCC) 06/30/2021   Chronic depression 06/08/2021   Chronic obstructive pulmonary disease, unspecified (HCC) 06/08/2021   Essential (primary) hypertension 06/08/2021   Dementia with behavioral disturbance (HCC) 06/08/2021   Proteinuria, unspecified 06/08/2021   Hyperlipidemia, unspecified 06/08/2021   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   In Pre-Arrest Conditions (Patient Has Pulse and Is Breathing) May intubate, use advanced airway interventions and cardioversion/ACLS medications if appropriate or indicated. May transfer to ICU.   Consent: Discussion documented in EHR or advanced directives reviewed      09/09/23 1509           Code Status History     Date Active Date Inactive Code Status Order ID Comments User Context   08/31/2023 1741 09/09/2023 1509 Full Code [id number]  Cedric Cohn, DO Inpatient   08/31/2023 1421 08/31/2023 1740 Full Code [id number]  CoxTrenton Frock, DO ED   07/28/2023 1353 08/19/2023 1706 Full Code [id number]  Avi Body, MD ED   06/30/2021 1239 07/06/2021 1904 Full Code [id number]  Read Camel, MD ED   11/20/2019 0652 12/14/2019 2059 Full Code [id number]  Isa Manuel, MD ED       Prognosis: Poor overall   Care plan was discussed with attending  Thank you for allowing the Palliative Medicine Team to assist in the care of this patient.    Meribeth Standard, NP  Please contact Palliative Medicine Team phone at [phone] for questions and concerns.

## 2023-09-09 NOTE — Progress Notes (Signed)
 PHARMACY - ANTICOAGULATION CONSULT NOTE  Pharmacy Consult for heparin  infusioin Indication: pulmonary embolus  Allergies  Allergen Reactions   Penicillins Rash and Swelling    .Has patient had a PCN reaction causing immediate rash, facial/tongue/throat swelling, SOB or lightheadedness with hypotension: Unknown Has patient had a PCN reaction causing severe rash involving mucus membranes or skin necrosis: Unknown Has patient had a PCN reaction that required hospitalization: Unknown Has patient had a PCN reaction occurring within the last 10 years: Unknown If all of the above answers are "NO", then may proceed with Cephalosporin use.    Sulfa Antibiotics Rash and Swelling    Patient Measurements: Height: 5\' 8"  (172.7 cm) Weight: 74.3 kg (163 lb 12.8 oz) IBW/kg (Calculated) : 68.4 HEPARIN  DW (KG): 77  Vital Signs: Temp: 99.3 F (37.4 C) (04/28 0745) Temp Source: Axillary (04/28 0745) BP: 109/59 (04/28 1015) Pulse Rate: 120 (04/28 1015)  Labs: Recent Labs    09/07/23 0341 09/08/23 0218 09/09/23 0309  HGB 9.3* 9.0* 8.1*  HCT 28.1* 28.4* 26.0*  PLT 164 157 161  CREATININE 1.40* 1.29* 1.30*    Estimated Creatinine Clearance: 52.6 mL/min (A) (by C-G formula based on SCr of 1.3 mg/dL (H)).   Medical History: Past Medical History:  Diagnosis Date   CHF (congestive heart failure) (HCC)    Chronic kidney disease    COPD (chronic obstructive pulmonary disease) (HCC)    Dementia (HCC)     Medications:  Scheduled:   acetylcysteine   4 mL Nebulization BID   Chlorhexidine  Gluconate Cloth  6 each Topical Q1400   docusate  100 mg Per Tube BID   escitalopram   5 mg Per Tube QHS   free water  30 mL Per Tube Q4H   gabapentin   100 mg Per Tube Q8H   heparin  injection (subcutaneous)  5,000 Units Subcutaneous Q8H   hydrocortisone sod succinate (SOLU-CORTEF) inj  50 mg Intravenous Q6H   insulin aspart  0-15 Units Subcutaneous Q4H   insulin glargine-yfgn  10 Units Subcutaneous Daily    ipratropium-albuterol   3 mL Nebulization TID   nystatin   5 mL Oral QID   mouth rinse  15 mL Mouth Rinse Q2H   pantoprazole  (PROTONIX ) IV  40 mg Intravenous Q12H   polyethylene glycol  17 g Per Tube Daily   QUEtiapine   150 mg Per Tube QHS   QUEtiapine   50 mg Per Tube 2 times per day   valproic acid  500 mg Per Tube TID    Assessment: 68 year old male that presented with respiratory distress. CT angio shows a pulmonary embolus within the right superior pulmonary artery. Pharmacy has been consulted for initiaiton and management of a heparin  infusion. He was transitioned to apixaban  (last dose 09/03/23 2152) but is no longer appropriate to take oral medications.    Goal of Therapy:  Anti-Xa level 0.3-0.5 units/ml aPTT 66 - 85 seconds Monitor platelets by anticoagulation protocol: Yes   Plan:  ---start heparin  infusion at 950 units/hr (no bolus per MD request) ---check aPTT level in 8 hours (will check Anti-Xa level once daily but will use aPTT to guide therapy until Anti-Xa level and aPTT correlate) ---CBC daily while on heparin    Paul Vaughan 09/09/2023,10:46 AM

## 2023-09-10 DIAGNOSIS — J90 Pleural effusion, not elsewhere classified: Secondary | ICD-10-CM | POA: Diagnosis not present

## 2023-09-10 DIAGNOSIS — J189 Pneumonia, unspecified organism: Secondary | ICD-10-CM | POA: Diagnosis not present

## 2023-09-10 DIAGNOSIS — Z7189 Other specified counseling: Secondary | ICD-10-CM | POA: Diagnosis not present

## 2023-09-10 DIAGNOSIS — A419 Sepsis, unspecified organism: Secondary | ICD-10-CM | POA: Diagnosis not present

## 2023-09-10 DIAGNOSIS — R6520 Severe sepsis without septic shock: Secondary | ICD-10-CM | POA: Diagnosis not present

## 2023-09-10 DIAGNOSIS — I2699 Other pulmonary embolism without acute cor pulmonale: Secondary | ICD-10-CM | POA: Diagnosis not present

## 2023-09-10 DIAGNOSIS — J9601 Acute respiratory failure with hypoxia: Secondary | ICD-10-CM | POA: Diagnosis not present

## 2023-09-10 LAB — RENAL FUNCTION PANEL
Albumin: 2.2 g/dL — ABNORMAL LOW (ref 3.5–5.0)
Anion gap: 3 — ABNORMAL LOW (ref 5–15)
BUN: 69 mg/dL — ABNORMAL HIGH (ref 8–23)
CO2: 33 mmol/L — ABNORMAL HIGH (ref 22–32)
Calcium: 8.6 mg/dL — ABNORMAL LOW (ref 8.9–10.3)
Chloride: 104 mmol/L (ref 98–111)
Creatinine, Ser: 1.19 mg/dL (ref 0.61–1.24)
GFR, Estimated: >60 mL/min (ref >60)
Glucose, Bld: 235 mg/dL — ABNORMAL HIGH (ref 70–99)
Phosphorus: 2.4 mg/dL — ABNORMAL LOW (ref 2.5–4.6)
Potassium: 5 mmol/L (ref 3.5–5.1)
Sodium: 140 mmol/L (ref 135–145)

## 2023-09-10 LAB — BLOOD GAS, ARTERIAL
Acid-Base Excess: 8.6 mmol/L — ABNORMAL HIGH (ref 0.0–2.0)
Bicarbonate: 35.7 mmol/L — ABNORMAL HIGH (ref 20.0–28.0)
FIO2: 40 %
MECHVT: 500 mL
Mechanical Rate: 22
O2 Saturation: 99.1 %
PEEP: 8 cmH2O
Patient temperature: 37
pCO2 arterial: 59 mmHg — ABNORMAL HIGH (ref 32–48)
pH, Arterial: 7.39 (ref 7.35–7.45)
pO2, Arterial: 84 mmHg (ref 83–108)

## 2023-09-10 LAB — CBC
HCT: 22.1 % — ABNORMAL LOW (ref 39.0–52.0)
Hemoglobin: 6.9 g/dL — ABNORMAL LOW (ref 13.0–17.0)
MCH: 32.2 pg (ref 26.0–34.0)
MCHC: 31.2 g/dL (ref 30.0–36.0)
MCV: 103.3 fL — ABNORMAL HIGH (ref 80.0–100.0)
Platelets: 144 10*3/uL — ABNORMAL LOW (ref 150–400)
RBC: 2.14 MIL/uL — ABNORMAL LOW (ref 4.22–5.81)
RDW: 17.3 % — ABNORMAL HIGH (ref 11.5–15.5)
WBC: 16.8 10*3/uL — ABNORMAL HIGH (ref 4.0–10.5)
nRBC: 0 % (ref 0.0–0.2)

## 2023-09-10 LAB — GLUCOSE, CAPILLARY
Glucose-Capillary: 151 mg/dL — ABNORMAL HIGH (ref 70–99)
Glucose-Capillary: 167 mg/dL — ABNORMAL HIGH (ref 70–99)
Glucose-Capillary: 178 mg/dL — ABNORMAL HIGH (ref 70–99)
Glucose-Capillary: 191 mg/dL — ABNORMAL HIGH (ref 70–99)
Glucose-Capillary: 204 mg/dL — ABNORMAL HIGH (ref 70–99)
Glucose-Capillary: 227 mg/dL — ABNORMAL HIGH (ref 70–99)

## 2023-09-10 LAB — HEMOGLOBIN AND HEMATOCRIT, BLOOD
HCT: 24.5 % — ABNORMAL LOW (ref 39.0–52.0)
Hemoglobin: 8.1 g/dL — ABNORMAL LOW (ref 13.0–17.0)

## 2023-09-10 LAB — ABO/RH: ABO/RH(D): AB POS

## 2023-09-10 LAB — HEMOGLOBIN A1C
Hgb A1c MFr Bld: 5.3 % (ref 4.8–5.6)
Mean Plasma Glucose: 105 mg/dL

## 2023-09-10 LAB — CULTURE, BAL-QUANTITATIVE W GRAM STAIN

## 2023-09-10 LAB — PREPARE RBC (CROSSMATCH)

## 2023-09-10 LAB — HEPARIN LEVEL (UNFRACTIONATED): Heparin Unfractionated: 0.44 [IU]/mL (ref 0.30–0.70)

## 2023-09-10 LAB — MAGNESIUM: Magnesium: 2.7 mg/dL — ABNORMAL HIGH (ref 1.7–2.4)

## 2023-09-10 LAB — APTT: aPTT: 70 s — ABNORMAL HIGH (ref 24–36)

## 2023-09-10 LAB — TRIGLYCERIDES: Triglycerides: 165 mg/dL — ABNORMAL HIGH (ref <150)

## 2023-09-10 MED ORDER — INSULIN GLARGINE-YFGN 100 UNIT/ML ~~LOC~~ SOLN
10.0000 [IU] | Freq: Two times a day (BID) | SUBCUTANEOUS | Status: DC
  Administered 2023-09-10 – 2023-09-11 (×4): 10 [IU] via SUBCUTANEOUS
  Filled 2023-09-10 (×4): qty 0.1

## 2023-09-10 MED ORDER — SODIUM CHLORIDE 0.9 % IV SOLN
2.0000 g | INTRAVENOUS | Status: AC
  Administered 2023-09-10 – 2023-09-13 (×4): 2 g via INTRAVENOUS
  Filled 2023-09-10 (×4): qty 20

## 2023-09-10 MED ORDER — SODIUM CHLORIDE 0.9% IV SOLUTION: Freq: Once | INTRAVENOUS | Status: AC

## 2023-09-10 MED ORDER — INSULIN ASPART 100 UNIT/ML IJ SOLN
7.0000 [IU] | Freq: Once | INTRAMUSCULAR | Status: AC
  Administered 2023-09-10: 7 [IU] via SUBCUTANEOUS
  Filled 2023-09-10: qty 1

## 2023-09-10 MED ORDER — K PHOS MONO-SOD PHOS DI & MONO 155-852-130 MG PO TABS
500.0000 mg | ORAL_TABLET | Freq: Once | ORAL | Status: AC
  Administered 2023-09-10: 500 mg
  Filled 2023-09-10: qty 2

## 2023-09-10 MED ORDER — INSULIN ASPART 100 UNIT/ML IJ SOLN
5.0000 [IU] | Freq: Once | INTRAMUSCULAR | Status: AC
  Administered 2023-09-10: 5 [IU] via SUBCUTANEOUS
  Filled 2023-09-10: qty 1

## 2023-09-10 MED ORDER — DEXMEDETOMIDINE HCL IN NACL 400 MCG/100ML IV SOLN
0.0000 ug/kg/h | INTRAVENOUS | Status: DC
  Administered 2023-09-10: 0.4 ug/kg/h via INTRAVENOUS
  Filled 2023-09-10: qty 100

## 2023-09-10 MED ORDER — LABETALOL HCL 5 MG/ML IV SOLN
10.0000 mg | Freq: Once | INTRAVENOUS | Status: AC
  Administered 2023-09-10: 10 mg via INTRAVENOUS
  Filled 2023-09-10: qty 4

## 2023-09-10 MED ORDER — INSULIN ASPART 100 UNIT/ML IJ SOLN
5.0000 [IU] | INTRAMUSCULAR | Status: DC
  Administered 2023-09-10 – 2023-09-17 (×40): 5 [IU] via SUBCUTANEOUS
  Filled 2023-09-10 (×40): qty 1

## 2023-09-10 NOTE — TOC Progression Note (Signed)
 Transition of Care [facility] - Progression Note    Patient Details  Name: Paul Vaughan MRN: [id number] Date of Birth: 1956-02-21  [facility]) CM/SW Contact  Siddarth Hsiung A Emmelia Holdsworth, RN Phone Number: 09/10/2023, 12:45 PM  Clinical Narrative:    Chart reviewed.  Noted that patient remains on ventilator.  Slowly weaning off the ventilator.  Hgb dropped today and patient required one unit of blood.    TOC will continue to follow for discharge planning.     Expected Discharge Plan: Skilled Nursing Facility Barriers to Discharge: Continued Medical Work up  Expected Discharge Plan and Services   Discharge Planning Services: CM Consult Post Acute Care Choice: Skilled Nursing Facility Living arrangements for the past 2 months: Skilled Nursing Facility                                       Social Determinants of Health (SDOH) Interventions SDOH Screenings   Food Insecurity: Patient Unable To Answer (08/31/2023)  Housing: Patient Unable To Answer (09/01/2023)  Transportation Needs: Patient Unable To Answer (08/31/2023)  Utilities: Patient Unable To Answer (08/31/2023)  Social Connections: Patient Unable To Answer (08/31/2023)  Tobacco Use: High Risk (08/31/2023)    Readmission Risk Interventions    07/01/2021    1:02 PM  Readmission Risk Prevention Plan  Transportation Screening Complete  PCP or Specialist Appt within 5-7 Days Complete  Home Care Screening Complete  Medication Review (RN CM) Complete

## 2023-09-10 NOTE — Progress Notes (Signed)
 PHARMACY - ANTICOAGULATION CONSULT NOTE  Pharmacy Consult for heparin  infusion Indication: pulmonary embolus  Patient Measurements: Height: 5' 7.99" (172.7 cm) Weight: 76.4 kg (168 lb 6.9 oz) IBW/kg (Calculated) : 68.38 HEPARIN  DW (KG): 77  Labs: Recent Labs    09/08/23 0218 09/09/23 0309 09/09/23 2209 09/10/23 0540 09/10/23 0801  HGB 9.0* 8.1*  --  6.9*  --   HCT 28.4* 26.0*  --  22.1*  --   PLT 157 161  --  144*  --   APTT  --   --  104*  --  70*  HEPARINUNFRC  --   --   --   --  0.44  CREATININE 1.29* 1.30*  --  1.19  --     Estimated Creatinine Clearance: 57.5 mL/min (by C-G formula based on SCr of 1.19 mg/dL).  Medical History: Past Medical History:  Diagnosis Date   CHF (congestive heart failure) (HCC)    Chronic kidney disease    COPD (chronic obstructive pulmonary disease) (HCC)    Dementia (HCC)    Assessment: 68 year old male that presented with respiratory distress. CT angio shows a pulmonary embolus within the right superior pulmonary artery. Pharmacy has been consulted for initiaiton and management of a heparin  infusion. He was transitioned to apixaban  (last dose 09/03/23 2152) but is no longer appropriate to take oral medications.    Goal of Therapy:  Anti-Xa level 0.3-0.5 units/ml aPTT 66 - 85 seconds Monitor platelets by anticoagulation protocol: Yes  04/28 2209 aPTT 104, supratherapeutic 0429 0801 aPTT 70s, HL 0.44; thera x 1   Plan:  --Appears heparin  was held 4/29 at 0731 for worsening Hgb. Continue to monitor plan per PCCM  Page Boast 09/10/2023 9:07 AM

## 2023-09-10 NOTE — Progress Notes (Signed)
 PHARMACY CONSULT NOTE  Pharmacy Consult for Electrolyte Monitoring and Replacement   Recent Labs: Potassium (mmol/L)  Date Value  09/10/2023 5.0   Magnesium (mg/dL)  Date Value  13/24/4010 2.7 (H)   Calcium  (mg/dL)  Date Value  27/25/3664 8.6 (L)   Albumin  (g/dL)  Date Value  40/34/7425 2.2 (L)   Phosphorus (mg/dL)  Date Value  95/63/8756 2.4 (L)   Sodium (mmol/L)  Date Value  09/10/2023 140   Assessment:  68 y.o. male with medical history significant of dementia, CKD stage IIIa, COPD, CHF, who presented to emergency department for chief concerns of respiratory distress. Pharmacy is asked to follow and replace electrolytes while in CCU  Goal of Therapy:  Electrolytes WNL  Plan:  --Phos 2.4, K Phos Neutral 500 mg per tube x 1 dose --Re-check electrolytes in AM  Page Boast 09/10/2023 7:24 AM

## 2023-09-10 NOTE — Progress Notes (Addendum)
 Daily Progress Note   Patient Name: Paul Vaughan       Date: 09/10/2023 DOB: 02/28/1956  Age: 68 y.o. MRN#: [id number] Attending Physician: [name], MD Primary Care Physician: Meri Stammer, NP Admit Date: 08/31/2023  Reason for Consultation/Follow-up: Establishing goals of care  Subjective: Notes and labs reviewed.  In to see patient.  He is currently in bed on ventilator support.  He appears restless.  No family at bedside.  After communication with CCM called to speak with DSS Zyannah.  Per CCM communication, patient failed his SBT today as he became agitated with drop in O2 sats.  Trial was ended and he was re-sedation.  Discussed his status and decisions and timelines to prepare for.  Will follow-up with DSS again tomorrow.    Length of Stay: 10  Current Medications: Scheduled Meds:   acetylcysteine   4 mL Nebulization BID   Chlorhexidine  Gluconate Cloth  6 each Topical Q1400   docusate  100 mg Per Tube BID   escitalopram   5 mg Per Tube QHS   free water  30 mL Per Tube Q4H   gabapentin   100 mg Per Tube Q8H   hydrocortisone sod succinate (SOLU-CORTEF) inj  50 mg Intravenous Q6H   insulin aspart  0-20 Units Subcutaneous Q4H   insulin aspart  5 Units Subcutaneous Q4H   insulin glargine-yfgn  10 Units Subcutaneous BID   ipratropium-albuterol   3 mL Nebulization TID   mupirocin ointment  1 Application Nasal BID   nystatin   5 mL Oral QID   mouth rinse  15 mL Mouth Rinse Q2H   pantoprazole  (PROTONIX ) IV  40 mg Intravenous Q12H   polyethylene glycol  17 g Per Tube Daily   QUEtiapine   150 mg Per Tube QHS   QUEtiapine   50 mg Per Tube 2 times per day   valproic acid  500 mg Per Tube TID    Continuous Infusions:  cefTRIAXone  (ROCEPHIN )  IV 2 g (09/10/23 1151)    dexmedetomidine (PRECEDEX) IV infusion 0.4 mcg/kg/hr (09/10/23 1218)   feeding supplement (VITAL AF 1.2 CAL) 65 mL/hr at 09/10/23 0947   fentaNYL  infusion INTRAVENOUS Stopped (09/10/23 0819)   norepinephrine  (LEVOPHED ) Adult infusion Stopped (09/10/23 0819)   propofol  (DIPRIVAN ) infusion Stopped (09/10/23 0819)   vancomycin  Stopped (09/10/23 0745)    PRN Meds: bisacodyl,  fentaNYL , liver oil-zinc  oxide, midazolam , mouth rinse  Physical Exam Constitutional:      Comments: Agitated.  Mittens in place, moving his arms.  HENT:     Head: Normocephalic.  Pulmonary:     Comments: On ventilator Skin:    General: Skin is warm and dry.             Vital Signs: BP 135/71   Pulse (!) 111   Temp 97.9 F (36.6 C) (Axillary)   Resp 14   Ht 5' 7.99" (1.727 m)   Wt 76.4 kg   SpO2 92%   BMI 25.62 kg/m  SpO2: SpO2: 92 % O2 Device: O2 Device: Ventilator O2 Flow Rate: O2 Flow Rate (L/min): 50 L/min  Intake/output summary:  Intake/Output Summary (Last 24 hours) at 09/10/2023 1226 Last data filed at 09/10/2023 1207 Gross per 24 hour  Intake 3186.98 ml  Output 1500 ml  Net 1686.98 ml   LBM: Last BM Date : 09/10/23 Baseline Weight: Weight: 77 kg Most recent weight: Weight: 76.4 kg   Patient Active Problem List   Diagnosis Date Noted   Malnutrition of moderate degree 09/07/2023   Acute pulmonary embolism (HCC) 09/01/2023   Severe sepsis with acute organ dysfunction (HCC) 08/31/2023   Acute on chronic hypoxic respiratory failure (HCC) 08/31/2023   Multifocal pneumonia 07/29/2023   Chronic diastolic CHF (congestive heart failure) (HCC) 07/29/2023   Rhabdomyolysis 07/29/2023   Acute hypoxic respiratory failure (HCC) 07/28/2023   Elevated LFTs 07/28/2023   Thrombocytopenia (HCC) 07/01/2021   Anemia of chronic disease 07/01/2021   AKI (acute kidney injury) (HCC) 07/01/2021   Severe sepsis (HCC) 06/30/2021   Acute hypoxemic respiratory failure due to COVID-19 (HCC) 06/30/2021   Chronic  depression 06/08/2021   Chronic obstructive pulmonary disease, unspecified (HCC) 06/08/2021   Essential (primary) hypertension 06/08/2021   Dementia with behavioral disturbance (HCC) 06/08/2021   Proteinuria, unspecified 06/08/2021   Hyperlipidemia, unspecified 06/08/2021   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 Continue current care.   PMT will continue to follow.   Code Status:    Code Status Orders  (From admission, onward)           Start     Ordered   09/09/23 1510  Do not attempt resuscitation (DNR) Pre-Arrest Interventions Desired  (Code Status)  Continuous       Question Answer Comment  If pulseless and not breathing No CPR or chest compressions.   In Pre-Arrest Conditions (Patient Has Pulse and Is Breathing) May intubate, use advanced airway interventions and cardioversion/ACLS medications if appropriate or indicated. May transfer to ICU.   Consent: Discussion documented in EHR or advanced directives reviewed      09/09/23 1509           Code Status History     Date Active Date Inactive Code Status Order ID Comments User Context   08/31/2023 1741 09/09/2023 1509 Full Code [id number]  Cedric Cohn, DO Inpatient   08/31/2023 1421 08/31/2023 1740 Full Code [id number]  Cedric Cohn, DO ED   07/28/2023 1353 08/19/2023 1706 Full Code [id number]  Avi Body, MD ED   06/30/2021 1239 07/06/2021 1904 Full Code [id number]  Read Camel, MD ED   11/20/2019 0652 12/14/2019 2059 Full Code [id number]  Isa Manuel, MD ED       Thank  you for allowing the Palliative Medicine Team to assist in the care of this patient.    Meribeth Standard, NP  Please contact Palliative Medicine Team phone at [phone] for questions and concerns.

## 2023-09-10 NOTE — Progress Notes (Signed)
 NAME:  Paul Vaughan, MRN:  [id number], DOB:  [birth year], LOS: 10 ADMISSION DATE:  08/31/2023, CONSULTATION DATE:  09/02/23 REFERRING MD:  Dr Ariel Begun, CHIEF COMPLAINT:  Severe Sepsis with Septic shock   Brief Pt Description / Synopsis:  68 y.o male with PMHx significant for COPD on baseline 4L [place], HFpEF, intellectual learning difficultly, dementia, HLD, and diabetes mellitus who is admitted with Acute Metabolic Encephalopathy and Acute Hypoxic Respiratory Failure in the setting of Multifocal Pneumonia, Pulmonary Embolism, and Acute Decompensated HFmrEF.  Required intubation and mechanical ventilation.  History of Present Illness:  Paul Vaughan is a 68 year old male with history of non-insulin-dependent diabetes mellitus, hyperlipidemia, neuropathy, intellectual learning difficulty/dementia, COPD, on baseline 4 L nasal cannula, heart failure preserved ejection fraction, who presents emergency department for chief concerns of respiratory distress noticed by nursing staff.   Of note, he was recently admitted from 3/16 till 08/19/2023 when presented with generalized weakness, treated for pneumonia and rhabdo. Patient did had worsening respiratory status during that admission which was stabilizes and he was discharged on 4 L of oxygen to SNF.   ED Course: Initial Vital Signs: temperature of 101.4, respiration rate of 25, heart rate of 130, blood pressure 104/77, SpO2 of 95% on BiPAP  Significant Labs: sodium is 136, potassium 5.0, chloride 100, bicarb 23, BUN 36, serum creatinine 1.39, EGFR 55, nonfasting blood glucose 126, WBC 9.4, hemoglobin 12.4, platelets of 419.BNP was elevated at 486.2.  High sensitive troponin was 14.  Lactic acid was 5.7 and on repeat 1 hour later was 5.9. COVID/influenza A/influenza B/RSV PCR were negative. Blood cultures x 2  Imaging Chest X-ray>>IMPRESSION: Improved aeration at the right apex but new left perihilar and right basilar opacities compared to prior,  suspicious for bilateral pneumonia. CTa Chest>>IMPRESSION: 1. Pulmonary embolus within the right superior pulmonary artery. 2. Patchy airspace opacities bilaterally with consolidative changes posteriorly in the left upper lobe. Findings are compatible with multifocal pneumonia. 3. Mild mediastinal adenopathy, likely reactive. Medications Administered: DuoNebs one-time treatment, acetaminophen  1000 mg IV one-time dose, azithromycin  500 mg IV one-time dose, ceftriaxone  2 g IV one-time dose, LR 2.5 mL liter bolus were given, and vancomycin  per pharmacy   Hospitalists were asked to admit for further workup and treatment.  Please see "Significant Hospital Events" section below for full detailed hospital course.   Pertinent  Medical History   Past Medical History:  Diagnosis Date   CHF (congestive heart failure) (HCC)    Chronic kidney disease    COPD (chronic obstructive pulmonary disease) (HCC)    Dementia (HCC)     Micro Data:  4/19: SARS-CoV-2/flu/RSV PCR>> negative 4/19: Blood culture x 2>> no growth 4/19: MRSA PCR>>Negative  4/20: RVP>> negative 4/20: HIV>>nonreactive 4/20: Strep pneumo and Legionella urine antigens>> negative 4/23: Tracheal aspirate>> normal respiratory flora 4/24: BAL>> normal respiratory flora 4/26: BAL>> normal respiratory flora 4/28: MRSA PCR +  Antimicrobials:   Anti-infectives (From admission, onward)    Start     Dose/Rate Route Frequency Ordered Stop   09/10/23 0600  vancomycin  (VANCOREADY) IVPB 1250 mg/250 mL        1,250 mg 166.7 mL/hr over 90 Minutes Intravenous Every 24 hours 09/09/23 1336     09/09/23 1400  vancomycin  (VANCOREADY) IVPB 1500 mg/300 mL        1,500 mg 150 mL/hr over 120 Minutes Intravenous  Once 09/09/23 1226 09/09/23 1545   09/09/23 1200  ceFEPIme  (MAXIPIME ) 2 g in sodium chloride  0.9 % 100 mL IVPB  2 g 200 mL/hr over 30 Minutes Intravenous Every 12 hours 09/09/23 1043     09/06/23 1800  metroNIDAZOLE  (FLAGYL )  IVPB 500 mg  Status:  Discontinued        500 mg 100 mL/hr over 60 Minutes Intravenous Every 12 hours 09/06/23 1224 09/07/23 1046   09/06/23 1700  ceFEPIme  (MAXIPIME ) 2 g in sodium chloride  0.9 % 100 mL IVPB  Status:  Discontinued        2 g 200 mL/hr over 30 Minutes Intravenous Every 12 hours 09/06/23 1225 09/08/23 1040   09/04/23 1630  metroNIDAZOLE  (FLAGYL ) IVPB 500 mg  Status:  Discontinued        500 mg 100 mL/hr over 60 Minutes Intravenous Every 12 hours 09/04/23 1542 09/06/23 1209   09/03/23 1830  cefTRIAXone  (ROCEPHIN ) 2 g in sodium chloride  0.9 % 100 mL IVPB  Status:  Discontinued        2 g 200 mL/hr over 30 Minutes Intravenous Every 24 hours 09/03/23 1636 09/06/23 1209   09/01/23 1200  cefTRIAXone  (ROCEPHIN ) 2 g in sodium chloride  0.9 % 100 mL IVPB  Status:  Discontinued        2 g 200 mL/hr over 30 Minutes Intravenous Every 24 hours 08/31/23 1421 08/31/23 1427   09/01/23 1200  azithromycin  (ZITHROMAX ) 500 mg in sodium chloride  0.9 % 250 mL IVPB        500 mg 250 mL/hr over 60 Minutes Intravenous Every 24 hours 08/31/23 1421 09/04/23 1443   09/01/23 1000  ceFEPIme  (MAXIPIME ) 2 g in sodium chloride  0.9 % 100 mL IVPB  Status:  Discontinued        2 g 200 mL/hr over 30 Minutes Intravenous Every 8 hours 09/01/23 0841 09/03/23 1633   09/01/23 0000  ceFEPIme  (MAXIPIME ) 2 g in sodium chloride  0.9 % 100 mL IVPB  Status:  Discontinued        2 g 200 mL/hr over 30 Minutes Intravenous Every 12 hours 08/31/23 1444 09/01/23 0841   08/31/23 1530  vancomycin  (VANCOREADY) IVPB 750 mg/150 mL        750 mg 150 mL/hr over 60 Minutes Intravenous  Once 08/31/23 1441 08/31/23 1700   08/31/23 1445  vancomycin  variable dose per unstable renal function (pharmacist dosing)  Status:  Discontinued         Does not apply See admin instructions 08/31/23 1445 09/01/23 0805   08/31/23 1145  vancomycin  (VANCOCIN ) IVPB 1000 mg/200 mL premix        1,000 mg 200 mL/hr over 60 Minutes Intravenous  Once 08/31/23  1140 08/31/23 1509   08/31/23 1145  ceFEPIme  (MAXIPIME ) 2 g in sodium chloride  0.9 % 100 mL IVPB        2 g 200 mL/hr over 30 Minutes Intravenous  Once 08/31/23 1140 08/31/23 1248   08/31/23 1145  azithromycin  (ZITHROMAX ) 500 mg in sodium chloride  0.9 % 250 mL IVPB        500 mg 250 mL/hr over 60 Minutes Intravenous  Once 08/31/23 1140 08/31/23 1311       Significant Hospital Events: Including procedures, antibiotic start and stop dates in addition to other pertinent events   4/20: Afebrile with mild tachycardia, remained on BiPAP.  Patient recently treated for multifocal pneumonia.  Also noted significantly elevated CK meeting criteria for rhabdomyolysis with no history of fall.  CK improved with IV fluid, seems like having chronic intermittently elevated CK. MRSA PCR negative so discontinuing vancomycin .  CTA chest was done due to  the significant hypoxia, had a small PE and right upper lobe-started on heparin  infusion.  Did show multifocal pneumonia.  Procalcitonin significantly elevated at 15.77.  Ordered expanded respiratory panel, strep pneumo and Legionella antigen. 4/21: Vital stable but remained on BiPAP.  RVP negative, strep pneumo negative, lactic acidosis resolved.  Labs seems stable.  Patient becoming tachypneic and requiring up to 15 L of oxygen when trying to wean off from BiPAP.  PCCM was also consulted.  Echocardiogram done-pending results 09/04/23- patient with increased O2 req now on elevated HFNC settings. High risk for intubation.  Palliative care is following 09/05/23-patient continues to have thick mucus per ETT and blood.  I will reach out to DSS to discuss his goals of care and if remains full scope may need to have EGD and bronchoscopy.  09/06/23- patient on 45% PRVC, blood per ETT has subsided on pressor support.  No Blood per OGT on LIS.  We will start nourishment today.. 09/07/23 - patient continues to desaturate to <70% with heavy mucus plugging and thick secretions from  ETT.  Plan to bronch today.  We reached approval for [facility] but currently patient is unstable for transfer. Obtained consent for bronchoscopy from [name] - director of DSS.  09/08/23- patient on 60% FiO2 PRVC, completed 9d abx.  No BM for 6d with +abd distension, for KUB today.  09/09/23- No significant events noted overnight.  Remains on high vent support (50% FiO2 & 10 PEEP). Requiring Levophed  (currently 5 mcg).  Given lack of clinical improvement with persistent leukocytosis, BAL cultures pending, will restart Cefepime  and start Vancomycin  as MRSA PCR is +.  No further reports of bleeding, plan to restart Heparin  gtt. 09/10/23- No significant events overnight, vent support slowly weaning (40% FiO2 & 8 PEEP).  Perform WUA and SBT as tolerated.  Became agitated and encephalopathic with desaturation and severe hypertension, therefore SBT aborted.  No reports of bleeding, but Hgb decreased to 6.9 from 8.1, will hold Heparin  gtt and transfuse 1 unit PRBC's.  Given improvement with initiation of ABX yesterday, will continue with Vancomycin  (MRSA PCR +), change Cefepime  to Ceftriaxone  given no pseudomonas on multiple cultures.  Interim History / Subjective:  As outlined above under "Significant Hospital Events" section  Objective   Blood pressure 118/63, pulse 99, temperature 98.2 F (36.8 C), temperature source Axillary, resp. rate 19, height 5' 7.99" (1.727 m), weight 76.4 kg, SpO2 92%.    Vent Mode: PSV FiO2 (%):  [40 %-50 %] 40 % Set Rate:  [16 bmp-22 bmp] 22 bmp Vt Set:  [410 mL-500 mL] 500 mL PEEP:  [8 cmH20-10 cmH20] 8 cmH20 Plateau Pressure:  [24 cmH20] 24 cmH20   Intake/Output Summary (Last 24 hours) at 09/10/2023 0957 Last data filed at 09/10/2023 1610 Gross per 24 hour  Intake 2984.68 ml  Output 1400 ml  Net 1584.68 ml   Filed Weights   09/08/23 0500 09/09/23 0500 09/10/23 0457  Weight: 73.6 kg 74.3 kg 76.4 kg    Examination: General: Acute on chronically  ill-appearing male, laying in bed, intubated and sedated, no acute distress HENT: Atraumatic, normocephalic, neck supple, no JVD, orally intubated Lungs: Coarse breath sounds throughout, even, nonlabored, occasionally overbreathing the vent Cardiovascular: Tachycardia, regular rhythm, S1-S2, no murmurs, rubs, or gallops Abdomen: Soft, nontender, nondistended, no guarding rebound tenderness, bowel sounds positive x 4 Extremities: Normal bulk and tone, no deformities, trace edema bilateral lower extremities, no cyanosis Neuro: Sedated, withdraws from pain, unable to follow commands, pupils PERRLA GU:  Foley catheter in place draining yellow urine  Resolved Hospital Problem list     Assessment & Plan:   #Acute Hypoxic Respiratory Failure due to ... #Multifocal Pneumonia ~ Aspiration #Pulmonary Embolism #Pulmonary edema & Pleural effusion #Mucus plugging PMHx: COPD on baseline 4L [place] -CTa Chest on 4/20 with PE in the right superior pulmonary artery, along with patchy airspace opacities bilaterally with consolidate changes in the LUL consistent with multifocal pneumonia -Full vent support, implement lung protective strategies -Plateau pressures less than 30 cm H20 -Wean FiO2 & PEEP as tolerated to maintain O2 sats >92% -Follow intermittent Chest X-ray & ABG as needed -Spontaneous Breathing Trials when respiratory parameters met and mental status permits -Implement VAP Bundle -Bronchodilators -IV Steroids -ABX as above -Diuresis as BP and renal function permits ~ currently holding due to AKI and vasopressors requirements -Status post Bronchoscopy x2 -Discussed with Dr. Darnelle Elders, will d/c Heparin  gtt on 4/29 given worsening Anemia requiring pRBC transfusion  #Hypotension: Septic shock +/- sedation related #Acute Decompensated HFmrEF Echocardiogram 09/02/23: LVEF 45-50%, mild LVH, indeterminate diastolic parameters, RV not well visualized -Continuous cardiac monitoring -Maintain MAP  >65 -Vasopressors as needed to maintain MAP goal -Transfusions as indicated -Lactic acid has normalized -HS Troponin negative x2 ( 14 ~ 12) -Diuresis as BP and renal function permits ~ currently holding due to AKI and vasopressor requirements  #Meets SIRS Criteria (HR >90, WBC 19.6) #Sepsis due to Multifocal Pneumonia -Monitor fever curve -Trend WBC's & Procalcitonin -Follow cultures as above -Completed course of Azithromycin  & Ceftriaxone /Cefepime   ~ continue Vancomycin  given + MRSA PCR, resume Ceftriaxone   #AKI -Monitor I&O's / urinary output -Follow BMP -Ensure adequate renal perfusion -Avoid nephrotoxic agents as able -Replace electrolytes as indicated ~ Pharmacy following for assistance with electrolyte replacement  #Acute Blood Loss Anemia due to Hemoptysis vs ? GI Bleed -Monitor for S/Sx of bleeding -Trend CBC -SCD for VTE Prophylaxis ~ will hold Heparin  gtt on 4/29 -Transfuse for Hgb <7 ~ giving 1 unit pRBC's on 4/29 -Continue PPI BID  #Diabetes Mellitus with Hyperglycemia -CBG's q4h; Target range of 140 to 180 -SSI -Follow ICU Hypo/Hyperglycemia protocol  #Acute Metabolic Encephalopathy #Sedation needs in setting of mechanical ventilation -Treatment of sepsis and metabolic derangements as outlined above -Maintain a RASS goal of 0 to -1 -FEntanyl  and Propofol  as needed to maintain RASS goal -Avoid sedating medications as able -Daily wake up assessment -Continue Seroquel  and Valproic Acid      Pt is critically ill with multiorgan failure. Prognosis is guarded, high risk for further decompensation, cardiac arrest, and death.  Given current critical illness superimposed on multiple chronic co-morbidities and advanced age, overall long term prognosis is poor.  He DNR status.  Pt has had recent admission for aspiration and with concern for aspiration this admission.  This may represent a new pattern for the pt, and would advise against Trach/PEG.  Palliative Care  following to assist with GOC discussions.   Best Practice (right click and "Reselect all SmartList Selections" daily)   Diet/type: tubefeeds and NPO DVT prophylaxis: SCD's (holding Heparin  due to worsening anemia requiring transfusion) GI prophylaxis: PPI Lines: Central line and yes and it is still needed Foley:  Yes, and it is still needed Code Status:  DNR Last date of multidisciplinary goals of care discussion [4/29]   4/29: Will update pt's legal guardian with DSS [name] via telephone.   Labs   CBC: Recent Labs  Lab 09/06/23 0544 09/07/23 6578 09/08/23 4696 09/09/23 0309 09/10/23 0540  WBC 13.5* 16.6* 19.2* 19.6* 16.8*  HGB 8.9* 9.3* 9.0* 8.1* 6.9*  HCT 28.0* 28.1* 28.4* 26.0* 22.1*  MCV 100.7* 98.9 101.4* 103.2* 103.3*  PLT 149* 164 157 161 144*    Basic Metabolic Panel: Recent Labs  Lab 09/06/23 0544 09/07/23 0341 09/08/23 0218 09/09/23 0309 09/10/23 0540  NA 140 142 145 145 140  K 3.6 3.3* 3.7 4.8 5.0  CL 100 105 106 104 104  CO2 31 30 32 32 33*  GLUCOSE 106* 135* 159* 267* 235*  BUN 58* 62* 63* 63* 69*  CREATININE 1.51* 1.40* 1.29* 1.30* 1.19  CALCIUM  8.4* 8.5* 8.7* 9.0 8.6*  MG 2.2 2.4 2.5* 2.7* 2.7*  PHOS 4.3 3.3 3.2 3.4 2.4*   GFR: Estimated Creatinine Clearance: 57.5 mL/min (by C-G formula based on SCr of 1.19 mg/dL). Recent Labs  Lab 09/07/23 0341 09/08/23 0218 09/09/23 0309 09/10/23 0540  PROCALCITON  --  1.16  --   --   WBC 16.6* 19.2* 19.6* 16.8*    Liver Function Tests: Recent Labs  Lab 09/06/23 0544 09/07/23 0341 09/08/23 0218 09/09/23 0309 09/10/23 0540  ALBUMIN  1.9* 1.9* 1.8* 1.6* 2.2*   No results for input(s): "LIPASE", "AMYLASE" in the last 168 hours. No results for input(s): "AMMONIA" in the last 168 hours.  ABG    Component Value Date/Time   PHART 7.31 (L) 09/09/2023 2045   PCO2ART 69 (HH) 09/09/2023 2045   PO2ART 97 09/09/2023 2045   HCO3 34.7 (H) 09/09/2023 2045   ACIDBASEDEF 0.7 08/31/2023 1709    O2SAT 99.1 09/09/2023 2045     Coagulation Profile: No results for input(s): "INR", "PROTIME" in the last 168 hours.  Cardiac Enzymes: No results for input(s): "CKTOTAL", "CKMB", "CKMBINDEX", "TROPONINI" in the last 168 hours.  HbA1C: Hgb A1c MFr Bld  Date/Time Value Ref Range Status  09/09/2023 03:09 AM 5.3 4.8 - 5.6 % Final    Comment:    (NOTE)         Prediabetes: 5.7 - 6.4         Diabetes: >6.4         Glycemic control for adults with diabetes: <7.0     CBG: Recent Labs  Lab 09/09/23 1618 09/09/23 1937 09/09/23 2337 09/10/23 0452 09/10/23 0724  GLUCAP 270* 276* 300* 227* 204*    Review of Systems:   Unable to assess due to AMS/intubation/sedation/critical illness   Past Medical History:  He,  has a past medical history of CHF (congestive heart failure) (HCC), Chronic kidney disease, COPD (chronic obstructive pulmonary disease) (HCC), and Dementia (HCC).   Surgical History:  History reviewed. No pertinent surgical history.   Social History:   reports that he has been smoking cigarettes. He has never used smokeless tobacco. He reports that he does not drink alcohol and does not use drugs.   Family History:  His Family history is unknown by patient.   Allergies Allergies  Allergen Reactions   Penicillins Rash and Swelling    .Has patient had a PCN reaction causing immediate rash, facial/tongue/throat swelling, SOB or lightheadedness with hypotension: Unknown Has patient had a PCN reaction causing severe rash involving mucus membranes or skin necrosis: Unknown Has patient had a PCN reaction that required hospitalization: Unknown Has patient had a PCN reaction occurring within the last 10 years: Unknown If all of the above answers are "NO", then may proceed with Cephalosporin use.    Sulfa Antibiotics Rash and Swelling     Home Medications  Prior to Admission  medications   Medication Sig Start Date End Date Taking? Authorizing Provider  albuterol   (PROVENTIL ) (2.5 MG/3ML) 0.083% nebulizer solution Take 2.5 mg by nebulization QID.   Yes [provider]  albuterol  (VENTOLIN  HFA) 108 (90 Base) MCG/ACT inhaler Inhale 2 puffs into the lungs every 6 (six) hours as needed for wheezing or shortness of breath.   Yes [provider]  aspirin  EC 81 MG tablet Take 81 mg by mouth in the morning. Swallow whole.   Yes [provider]  divalproex  (DEPAKOTE  ER) 500 MG 24 hr tablet 1 tablet (500 mg) every morning and 2 tablets (1000 mg) at bedtime 08/19/23  Yes Alexander, Natalie, DO  escitalopram  (LEXAPRO ) 5 MG tablet Take 5 mg by mouth at bedtime. 06/05/21  Yes [provider]  ferrous sulfate ER (IRON SLOW RELEASE) 142 (45 Fe) MG TBCR tablet Take 45 mg by mouth in the morning.   Yes [provider]  folic acid  (FOLVITE ) 1 MG tablet Take 1 mg by mouth in the morning.   Yes [provider]  gabapentin  (NEURONTIN ) 100 MG capsule Take 100 mg by mouth 3 (three) times daily. 06/05/21  Yes [provider]  JARDIANCE  10 MG TABS tablet Take 10 mg by mouth in the morning.   Yes [provider]  Multiple Vitamin (MULTIVITAMIN WITH MINERALS) TABS tablet Take 1 tablet by mouth daily. 08/19/23  Yes Alexander, Natalie, DO  naloxone [facility]) nasal spray 4 mg/0.1 mL Place 1 spray into the nose once.   Yes [provider]  ondansetron  (ZOFRAN ) 4 MG tablet Take 1 tablet (4 mg total) by mouth every 6 (six) hours as needed for nausea. 08/19/23  Yes Alexander, Natalie, DO  polyethylene glycol (MIRALAX  / GLYCOLAX ) 17 g packet Take 17 g by mouth daily as needed for mild constipation. 08/19/23  Yes Alexander, Natalie, DO  PRALUENT 150 MG/ML SOAJ Inject 150 mg into the skin as directed. Every 2 weeks in the morning   Yes [provider]  pyridOXINE  (VITAMIN B-6) 100 MG tablet Take 100 mg by mouth at bedtime.   Yes [provider]  QUEtiapine  (SEROQUEL ) 100 MG tablet Take 150 mg by mouth at  bedtime. 06/05/21  Yes [provider]  QUEtiapine  (SEROQUEL ) 50 MG tablet Take 50 mg by mouth 2 (two) times daily. 50 mg in the morning (2) 50 mg at 2 pm 06/05/21  Yes [provider]  rosuvastatin  (CRESTOR ) 20 MG tablet Take 20 mg by mouth at bedtime. 06/05/21  Yes [provider]  thiamine  100 MG tablet Take 100 mg by mouth in the morning.   Yes [provider]  feeding supplement (ENSURE ENLIVE / ENSURE PLUS) LIQD Take 237 mLs by mouth 3 (three) times daily between meals. 08/19/23   Alexander, Natalie, DO     Critical care time: 40 minutes     Cherylann Corpus, AGACNP-BC [facility] Prefer epic messenger for cross cover needs If after hours, please call E-link

## 2023-09-11 ENCOUNTER — Inpatient Hospital Stay

## 2023-09-11 DIAGNOSIS — Z7189 Other specified counseling: Secondary | ICD-10-CM | POA: Diagnosis not present

## 2023-09-11 DIAGNOSIS — J189 Pneumonia, unspecified organism: Secondary | ICD-10-CM | POA: Diagnosis not present

## 2023-09-11 DIAGNOSIS — J90 Pleural effusion, not elsewhere classified: Secondary | ICD-10-CM | POA: Diagnosis not present

## 2023-09-11 DIAGNOSIS — A419 Sepsis, unspecified organism: Secondary | ICD-10-CM | POA: Diagnosis not present

## 2023-09-11 DIAGNOSIS — R6520 Severe sepsis without septic shock: Secondary | ICD-10-CM | POA: Diagnosis not present

## 2023-09-11 DIAGNOSIS — I2699 Other pulmonary embolism without acute cor pulmonale: Secondary | ICD-10-CM | POA: Diagnosis not present

## 2023-09-11 DIAGNOSIS — J9601 Acute respiratory failure with hypoxia: Secondary | ICD-10-CM | POA: Diagnosis not present

## 2023-09-11 LAB — BASIC METABOLIC PANEL WITH GFR
Anion gap: 8 (ref 5–15)
BUN: 84 mg/dL — ABNORMAL HIGH (ref 8–23)
CO2: 32 mmol/L (ref 22–32)
Calcium: 8.7 mg/dL — ABNORMAL LOW (ref 8.9–10.3)
Chloride: 108 mmol/L (ref 98–111)
Creatinine, Ser: 0.84 mg/dL (ref 0.61–1.24)
GFR, Estimated: >60 mL/min (ref >60)
Glucose, Bld: 172 mg/dL — ABNORMAL HIGH (ref 70–99)
Potassium: 4.6 mmol/L (ref 3.5–5.1)
Sodium: 148 mmol/L — ABNORMAL HIGH (ref 135–145)

## 2023-09-11 LAB — CBC
HCT: 25.1 % — ABNORMAL LOW (ref 39.0–52.0)
Hemoglobin: 8.4 g/dL — ABNORMAL LOW (ref 13.0–17.0)
MCH: 32.2 pg (ref 26.0–34.0)
MCHC: 33.5 g/dL (ref 30.0–36.0)
MCV: 96.2 fL (ref 80.0–100.0)
Platelets: 155 10*3/uL (ref 150–400)
RBC: 2.61 MIL/uL — ABNORMAL LOW (ref 4.22–5.81)
RDW: 16.7 % — ABNORMAL HIGH (ref 11.5–15.5)
WBC: 13.2 10*3/uL — ABNORMAL HIGH (ref 4.0–10.5)
nRBC: 0 % (ref 0.0–0.2)

## 2023-09-11 LAB — TYPE AND SCREEN
ABO/RH(D): AB POS
Antibody Screen: NEGATIVE
Unit division: 0

## 2023-09-11 LAB — BPAM RBC
Blood Product Expiration Date: 202505302359
ISSUE DATE / TIME: 202504290931
Unit Type and Rh: 6200

## 2023-09-11 LAB — PHOSPHORUS: Phosphorus: 2.2 mg/dL — ABNORMAL LOW (ref 2.5–4.6)

## 2023-09-11 LAB — GLUCOSE, CAPILLARY
Glucose-Capillary: 162 mg/dL — ABNORMAL HIGH (ref 70–99)
Glucose-Capillary: 175 mg/dL — ABNORMAL HIGH (ref 70–99)
Glucose-Capillary: 161 mg/dL — ABNORMAL HIGH (ref 70–99)
Glucose-Capillary: 149 mg/dL — ABNORMAL HIGH (ref 70–99)
Glucose-Capillary: 156 mg/dL — ABNORMAL HIGH (ref 70–99)

## 2023-09-11 LAB — MAGNESIUM: Magnesium: 2.7 mg/dL — ABNORMAL HIGH (ref 1.7–2.4)

## 2023-09-11 MED ORDER — THIAMINE HCL 100 MG PO TABS
100.0000 mg | ORAL_TABLET | Freq: Every day | ORAL | Status: AC
  Administered 2023-09-12 – 2023-09-17 (×7): 100 mg
  Filled 2023-09-11 (×10): qty 1

## 2023-09-11 MED ORDER — VANCOMYCIN HCL IN DEXTROSE 1-5 GM/200ML-% IV SOLN
1000.0000 mg | Freq: Two times a day (BID) | INTRAVENOUS | Status: AC
  Administered 2023-09-11 – 2023-09-13 (×5): 1000 mg via INTRAVENOUS
  Filled 2023-09-11 (×5): qty 200

## 2023-09-11 MED ORDER — FREE WATER
200.0000 mL | Freq: Four times a day (QID) | Status: DC
  Administered 2023-09-11 – 2023-09-17 (×23): 200 mL

## 2023-09-11 MED ORDER — INSULIN GLARGINE-YFGN 100 UNIT/ML ~~LOC~~ SOLN
15.0000 [IU] | Freq: Two times a day (BID) | SUBCUTANEOUS | Status: DC
  Administered 2023-09-11 – 2023-09-16 (×10): 15 [IU] via SUBCUTANEOUS
  Filled 2023-09-11 (×11): qty 0.15

## 2023-09-11 MED ORDER — VANCOMYCIN HCL 10 G IV SOLR
1250.0000 mg | INTRAVENOUS | Status: DC
  Administered 2023-09-11: 1250 mg via INTRAVENOUS
  Filled 2023-09-11: qty 12.5

## 2023-09-11 MED ORDER — HEPARIN SODIUM (PORCINE) 5000 UNIT/ML IJ SOLN
5000.0000 [IU] | Freq: Three times a day (TID) | INTRAMUSCULAR | Status: DC
  Administered 2023-09-11 – 2023-09-13 (×6): 5000 [IU] via SUBCUTANEOUS
  Filled 2023-09-11 (×6): qty 1

## 2023-09-11 MED ORDER — K PHOS MONO-SOD PHOS DI & MONO 155-852-130 MG PO TABS
500.0000 mg | ORAL_TABLET | Freq: Two times a day (BID) | ORAL | Status: AC
  Administered 2023-09-11 (×2): 500 mg
  Filled 2023-09-11 (×2): qty 2

## 2023-09-11 NOTE — Progress Notes (Signed)
 NAME:  Paul Vaughan, MRN:  [id number], DOB:  [birth year], LOS: 11 ADMISSION DATE:  08/31/2023, CONSULTATION DATE:  09/02/23 REFERRING MD:  Dr Ariel Begun, CHIEF COMPLAINT:  Severe Sepsis with Septic shock   Brief Pt Description / Synopsis:  68 y.o male with PMHx significant for COPD on baseline 4L [place], HFpEF, intellectual learning difficultly, dementia, HLD, and diabetes mellitus who is admitted with Acute Metabolic Encephalopathy and Acute Hypoxic Respiratory Failure in the setting of Multifocal Pneumonia, Pulmonary Embolism, and Acute Decompensated HFmrEF.  Required intubation and mechanical ventilation.  History of Present Illness:  Paul Vaughan is a 68 year old male with history of non-insulin-dependent diabetes mellitus, hyperlipidemia, neuropathy, intellectual learning difficulty/dementia, COPD, on baseline 4 L nasal cannula, heart failure preserved ejection fraction, who presents emergency department for chief concerns of respiratory distress noticed by nursing staff.   Of note, he was recently admitted from 3/16 till 08/19/2023 when presented with generalized weakness, treated for pneumonia and rhabdo. Patient did had worsening respiratory status during that admission which was stabilizes and he was discharged on 4 L of oxygen to SNF.   ED Course: Initial Vital Signs: temperature of 101.4, respiration rate of 25, heart rate of 130, blood pressure 104/77, SpO2 of 95% on BiPAP  Significant Labs: sodium is 136, potassium 5.0, chloride 100, bicarb 23, BUN 36, serum creatinine 1.39, EGFR 55, nonfasting blood glucose 126, WBC 9.4, hemoglobin 12.4, platelets of 419.BNP was elevated at 486.2.  High sensitive troponin was 14.  Lactic acid was 5.7 and on repeat 1 hour later was 5.9. COVID/influenza A/influenza B/RSV PCR were negative. Blood cultures x 2  Imaging Chest X-ray>>IMPRESSION: Improved aeration at the right apex but new left perihilar and right basilar opacities compared to prior,  suspicious for bilateral pneumonia. CTa Chest>>IMPRESSION: 1. Pulmonary embolus within the right superior pulmonary artery. 2. Patchy airspace opacities bilaterally with consolidative changes posteriorly in the left upper lobe. Findings are compatible with multifocal pneumonia. 3. Mild mediastinal adenopathy, likely reactive. Medications Administered: DuoNebs one-time treatment, acetaminophen  1000 mg IV one-time dose, azithromycin  500 mg IV one-time dose, ceftriaxone  2 g IV one-time dose, LR 2.5 mL liter bolus were given, and vancomycin  per pharmacy   Hospitalists were asked to admit for further workup and treatment.  Please see "Significant Hospital Events" section below for full detailed hospital course.   Pertinent  Medical History   Past Medical History:  Diagnosis Date   CHF (congestive heart failure) (HCC)    Chronic kidney disease    COPD (chronic obstructive pulmonary disease) (HCC)    Dementia (HCC)     Micro Data:  4/19: SARS-CoV-2/flu/RSV PCR>> negative 4/19: Blood culture x 2>> no growth 4/19: MRSA PCR>>Negative  4/20: RVP>> negative 4/20: HIV>>nonreactive 4/20: Strep pneumo and Legionella urine antigens>> negative 4/23: Tracheal aspirate>> normal respiratory flora 4/24: BAL>> normal respiratory flora 4/26: BAL>> normal respiratory flora 4/28: MRSA PCR +  Antimicrobials:   Anti-infectives (From admission, onward)    Start     Dose/Rate Route Frequency Ordered Stop   09/11/23 2200  vancomycin  (VANCOCIN ) IVPB 1000 mg/200 mL premix        1,000 mg 200 mL/hr over 60 Minutes Intravenous Every 12 hours 09/11/23 0741     09/11/23 0700  vancomycin  (VANCOCIN ) 1,250 mg in sodium chloride  0.9 % 250 mL IVPB  Status:  Discontinued        1,250 mg 175 mL/hr over 90 Minutes Intravenous Every 24 hours 09/11/23 0632 09/11/23 0741   09/10/23 1200  cefTRIAXone  (ROCEPHIN ) 2  g in sodium chloride  0.9 % 100 mL IVPB        2 g 200 mL/hr over 30 Minutes Intravenous Every 24 hours  09/10/23 1042     09/10/23 0600  vancomycin  (VANCOREADY) IVPB 1250 mg/250 mL  Status:  Discontinued        1,250 mg 166.7 mL/hr over 90 Minutes Intravenous Every 24 hours 09/09/23 1336 09/11/23 0632   09/09/23 1400  vancomycin  (VANCOREADY) IVPB 1500 mg/300 mL        1,500 mg 150 mL/hr over 120 Minutes Intravenous  Once 09/09/23 1226 09/09/23 1545   09/09/23 1200  ceFEPIme  (MAXIPIME ) 2 g in sodium chloride  0.9 % 100 mL IVPB  Status:  Discontinued        2 g 200 mL/hr over 30 Minutes Intravenous Every 12 hours 09/09/23 1043 09/10/23 1041   09/06/23 1800  metroNIDAZOLE  (FLAGYL ) IVPB 500 mg  Status:  Discontinued        500 mg 100 mL/hr over 60 Minutes Intravenous Every 12 hours 09/06/23 1224 09/07/23 1046   09/06/23 1700  ceFEPIme  (MAXIPIME ) 2 g in sodium chloride  0.9 % 100 mL IVPB  Status:  Discontinued        2 g 200 mL/hr over 30 Minutes Intravenous Every 12 hours 09/06/23 1225 09/08/23 1040   09/04/23 1630  metroNIDAZOLE  (FLAGYL ) IVPB 500 mg  Status:  Discontinued        500 mg 100 mL/hr over 60 Minutes Intravenous Every 12 hours 09/04/23 1542 09/06/23 1209   09/03/23 1830  cefTRIAXone  (ROCEPHIN ) 2 g in sodium chloride  0.9 % 100 mL IVPB  Status:  Discontinued        2 g 200 mL/hr over 30 Minutes Intravenous Every 24 hours 09/03/23 1636 09/06/23 1209   09/01/23 1200  cefTRIAXone  (ROCEPHIN ) 2 g in sodium chloride  0.9 % 100 mL IVPB  Status:  Discontinued        2 g 200 mL/hr over 30 Minutes Intravenous Every 24 hours 08/31/23 1421 08/31/23 1427   09/01/23 1200  azithromycin  (ZITHROMAX ) 500 mg in sodium chloride  0.9 % 250 mL IVPB        500 mg 250 mL/hr over 60 Minutes Intravenous Every 24 hours 08/31/23 1421 09/04/23 1443   09/01/23 1000  ceFEPIme  (MAXIPIME ) 2 g in sodium chloride  0.9 % 100 mL IVPB  Status:  Discontinued        2 g 200 mL/hr over 30 Minutes Intravenous Every 8 hours 09/01/23 0841 09/03/23 1633   09/01/23 0000  ceFEPIme  (MAXIPIME ) 2 g in sodium chloride  0.9 % 100 mL IVPB   Status:  Discontinued        2 g 200 mL/hr over 30 Minutes Intravenous Every 12 hours 08/31/23 1444 09/01/23 0841   08/31/23 1530  vancomycin  (VANCOREADY) IVPB 750 mg/150 mL        750 mg 150 mL/hr over 60 Minutes Intravenous  Once 08/31/23 1441 08/31/23 1700   08/31/23 1445  vancomycin  variable dose per unstable renal function (pharmacist dosing)  Status:  Discontinued         Does not apply See admin instructions 08/31/23 1445 09/01/23 0805   08/31/23 1145  vancomycin  (VANCOCIN ) IVPB 1000 mg/200 mL premix        1,000 mg 200 mL/hr over 60 Minutes Intravenous  Once 08/31/23 1140 08/31/23 1509   08/31/23 1145  ceFEPIme  (MAXIPIME ) 2 g in sodium chloride  0.9 % 100 mL IVPB        2 g 200 mL/hr  over 30 Minutes Intravenous  Once 08/31/23 1140 08/31/23 1248   08/31/23 1145  azithromycin  (ZITHROMAX ) 500 mg in sodium chloride  0.9 % 250 mL IVPB        500 mg 250 mL/hr over 60 Minutes Intravenous  Once 08/31/23 1140 08/31/23 1311       Significant Hospital Events: Including procedures, antibiotic start and stop dates in addition to other pertinent events   4/20: Afebrile with mild tachycardia, remained on BiPAP.  Patient recently treated for multifocal pneumonia.  Also noted significantly elevated CK meeting criteria for rhabdomyolysis with no history of fall.  CK improved with IV fluid, seems like having chronic intermittently elevated CK. MRSA PCR negative so discontinuing vancomycin .  CTA chest was done due to the significant hypoxia, had a small PE and right upper lobe-started on heparin  infusion.  Did show multifocal pneumonia.  Procalcitonin significantly elevated at 15.77.  Ordered expanded respiratory panel, strep pneumo and Legionella antigen. 4/21: Vital stable but remained on BiPAP.  RVP negative, strep pneumo negative, lactic acidosis resolved.  Labs seems stable.  Patient becoming tachypneic and requiring up to 15 L of oxygen when trying to wean off from BiPAP.  PCCM was also consulted.   Echocardiogram done-pending results 09/04/23- patient with increased O2 req now on elevated HFNC settings. High risk for intubation.  Palliative care is following 09/05/23-patient continues to have thick mucus per ETT and blood.  I will reach out to DSS to discuss his goals of care and if remains full scope may need to have EGD and bronchoscopy.  09/06/23- patient on 45% PRVC, blood per ETT has subsided on pressor support.  No Blood per OGT on LIS.  We will start nourishment today.. 09/07/23 - patient continues to desaturate to <70% with heavy mucus plugging and thick secretions from ETT.  Plan to bronch today.  We reached approval for [facility] but currently patient is unstable for transfer. Obtained consent for bronchoscopy from [name] - director of DSS.  09/08/23- patient on 60% FiO2 PRVC, completed 9d abx.  No BM for 6d with +abd distension, for KUB today.  09/09/23- No significant events noted overnight.  Remains on high vent support (50% FiO2 & 10 PEEP). Requiring Levophed  (currently 5 mcg).  Given lack of clinical improvement with persistent leukocytosis, BAL cultures pending, will restart Cefepime  and start Vancomycin  as MRSA PCR is +.  No further reports of bleeding, plan to restart Heparin  gtt. 09/10/23- No significant events overnight, vent support slowly weaning (40% FiO2 & 8 PEEP).  Perform WUA and SBT as tolerated.  Became agitated and encephalopathic with desaturation and severe hypertension, therefore SBT aborted.  No reports of bleeding, but Hgb decreased to 6.9 from 8.1, will hold Heparin  gtt and transfuse 1 unit PRBC's.  Given improvement with initiation of ABX yesterday, will continue with Vancomycin  (MRSA PCR +), change Cefepime  to Ceftriaxone  given no pseudomonas on multiple cultures. 09/11/23: Pt remains mechanically intubated failed SBT due to increased work of breathing and hypoxia.    Interim History / Subjective:  As outlined above under "Significant Hospital Events"  section  Objective   Blood pressure (!) 148/87, pulse (!) 122, temperature 97.9 F (36.6 C), temperature source Axillary, resp. rate 20, height 5' 7.99" (1.727 m), weight 77.6 kg, SpO2 (!) 89%.    Vent Mode: PSV FiO2 (%):  [40 %] 40 % Set Rate:  [22 bmp] 22 bmp Vt Set:  [500 mL] 500 mL PEEP:  [5 cmH20-8 cmH20] 5 cmH20 Pressure Support:  [  5 cmH20] 5 cmH20   Intake/Output Summary (Last 24 hours) at 09/11/2023 1026 Last data filed at 09/11/2023 0908 Gross per 24 hour  Intake 2226.27 ml  Output 1150 ml  Net 1076.27 ml   Filed Weights   09/09/23 0500 09/10/23 0457 09/11/23 0314  Weight: 74.3 kg 76.4 kg 77.6 kg    Examination: General: Acute on chronically ill-appearing male, laying in bed, sedated and intubated NAD  HENT: Atraumatic, normocephalic, neck supple, no JVD, orally intubated Lungs: Rhonchi throughout, even, non labored  Cardiovascular: NSR, s1s2, no m/r/g, 2+ radial/1+ distal pulses, mild edema  Abdomen: +BS x4, soft, non distended  Extremities: Normal bulk and tone, no deformities Neuro: Sedated, unable to follow commands, moves all extremities spontaneously, PERRL GU: External urinary catheter   Resolved Hospital Problem list     Assessment & Plan:   #Acute Metabolic Encephalopathy #Sedation needs in setting of mechanical ventilation - Treatment of sepsis and metabolic derangements as outlined above - Maintain a RASS goal of 0 to -1 - PAD protocol to maintain RASS goal: propofol  and fentanyl  as needed to maintain RASS goal - Avoid sedating medications as able - Daily wake up assessment - Continue seroquel  and valproic acid  #Acute Hypoxic Respiratory Failure due to ... #Multifocal Pneumonia ~ Aspiration #Pulmonary Embolism #Pulmonary edema & Pleural effusion #Mucus plugging Hx: COPD on baseline 4L [place] CTA Chest on 4/20: revealed PE in the right superior pulmonary artery, along with patchy airspace opacities bilaterally with consolidate changes in the LUL  consistent with multifocal pneumonia - Full vent support, implement lung protective strategies - Plateau pressures less than 30 cm H20 - Wean FiO2 & PEEP as tolerated to maintain O2 sats >92% - Follow intermittent CXR & ABG's as needed - Spontaneous Breathing Trials when respiratory parameters met and mental status permits - Implement VAP Bundle - Continue mucomyst   - Scheduled bronchodilator  - IV steroids wean as tolerated  - Status post Bronchoscopy x2  #Hypotension: septic shock +/- sedation related #Acute decompensated HFmrEF Echocardiogram 09/02/23: LVEF 45-50%, mild LVH, indeterminate diastolic parameters, RV not well visualized - Continuous telemetry monitoring - Maintain map 65 or higher  - Transfusions as indicated - Lactic acid has normalized - HS Troponin negative x2 - Diuresis as BP and renal function permits~currently holding due to AKI and vasopressor requirements  #AKI~resolved  #Hyponatremia  #Hypophosphatemia  - Trend BMP  - Replace electrolytes as indicated ~ Pharmacy following for assistance with electrolyte replacement - Strict I&O's - Avoid nephrotoxic agents - Increased free water flushes to 200 ml q6hrs   #Acute blood loss anemia due to hemoptysis vs possible GI bleed - Trend CBC - SCD for VTE Prophylaxis ~ will hold Heparin  gtt on 4/29 - Transfuse for Hgb <7 - Continue PPI BID  #Meets SIRS criteria (HR >90, WBC 19.6) #Sepsis due to multifocal pneumonia - Trend WBC and monitor fever curve  - Follow cultures  - Completed course of azithromycin  & ceftriaxone /cefepime ~continue vancomycin  given + MRSA PCR and ceftriaxone   #Type II diabetes mellitus  - CBG's q4h; Target range of 140 to 180 - SSI - Follow ICU hypo/hyperglycemia protocol  Pt is critically ill with multiorgan failure. Prognosis is guarded, high risk for further decompensation, cardiac arrest, and death.  Given current critical illness superimposed on multiple chronic co-morbidities and  advanced age, overall long term prognosis is poor.  He DNR status.  Pt has had recent admission for aspiration and with concern for aspiration this admission.  This may represent  a new pattern for the pt, and would advise against Trach/PEG.  Palliative Care following to assist with GOC discussions.  Best Practice (right click and "Reselect all SmartList Selections" daily)   Diet/type: tubefeeds and NPO DVT prophylaxis: SCD's and subcutaneous heparin   GI prophylaxis: PPI Lines: Central line and yes and it is still needed Foley: N/A Code Status:  DNR Last date of multidisciplinary goals of care discussion [09/11/23]  4/30: Attempted to update pt's legal guardian with DSS [name] via telephone.  Therefore, left HIPAA appropriate voicemail message instructing her to return my phone call  Labs   CBC: Recent Labs  Lab 09/07/23 0341 09/08/23 0218 09/09/23 0309 09/10/23 0540 09/10/23 1400 09/11/23 0307  WBC 16.6* 19.2* 19.6* 16.8*  --  13.2*  HGB 9.3* 9.0* 8.1* 6.9* 8.1* 8.4*  HCT 28.1* 28.4* 26.0* 22.1* 24.5* 25.1*  MCV 98.9 101.4* 103.2* 103.3*  --  96.2  PLT 164 157 161 144*  --  155    Basic Metabolic Panel: Recent Labs  Lab 09/07/23 0341 09/08/23 0218 09/09/23 0309 09/10/23 0540 09/11/23 0307  NA 142 145 145 140 148*  K 3.3* 3.7 4.8 5.0 4.6  CL 105 106 104 104 108  CO2 30 32 32 33* 32  GLUCOSE 135* 159* 267* 235* 172*  BUN 62* 63* 63* 69* 84*  CREATININE 1.40* 1.29* 1.30* 1.19 0.84  CALCIUM  8.5* 8.7* 9.0 8.6* 8.7*  MG 2.4 2.5* 2.7* 2.7* 2.7*  PHOS 3.3 3.2 3.4 2.4* 2.2*   GFR: Estimated Creatinine Clearance: 81.4 mL/min (by C-G formula based on SCr of 0.84 mg/dL). Recent Labs  Lab 09/08/23 0218 09/09/23 0309 09/10/23 0540 09/11/23 0307  PROCALCITON 1.16  --   --   --   WBC 19.2* 19.6* 16.8* 13.2*    Liver Function Tests: Recent Labs  Lab 09/06/23 0544 09/07/23 0341 09/08/23 0218 09/09/23 0309 09/10/23 0540  ALBUMIN  1.9* 1.9* 1.8* 1.6* 2.2*   No  results for input(s): "LIPASE", "AMYLASE" in the last 168 hours. No results for input(s): "AMMONIA" in the last 168 hours.  ABG    Component Value Date/Time   PHART 7.39 09/10/2023 1021   PCO2ART 59 (H) 09/10/2023 1021   PO2ART 84 09/10/2023 1021   HCO3 35.7 (H) 09/10/2023 1021   ACIDBASEDEF 0.7 08/31/2023 1709   O2SAT 99.1 09/10/2023 1021     Coagulation Profile: No results for input(s): "INR", "PROTIME" in the last 168 hours.  Cardiac Enzymes: No results for input(s): "CKTOTAL", "CKMB", "CKMBINDEX", "TROPONINI" in the last 168 hours.  HbA1C: Hgb A1c MFr Bld  Date/Time Value Ref Range Status  09/09/2023 03:09 AM 5.3 4.8 - 5.6 % Final    Comment:    (NOTE)         Prediabetes: 5.7 - 6.4         Diabetes: >6.4         Glycemic control for adults with diabetes: <7.0     CBG: Recent Labs  Lab 09/10/23 1542 09/10/23 1942 09/10/23 2357 09/11/23 0350 09/11/23 0723  GLUCAP 178* 151* 167* 162* 175*    Review of Systems:   Unable to assess due to AMS/intubation/sedation/critical illness   Past Medical History:  He,  has a past medical history of CHF (congestive heart failure) (HCC), Chronic kidney disease, COPD (chronic obstructive pulmonary disease) (HCC), and Dementia (HCC).   Surgical History:  History reviewed. No pertinent surgical history.   Social History:   reports that he has been smoking cigarettes. He has never  used smokeless tobacco. He reports that he does not drink alcohol and does not use drugs.   Family History:  His Family history is unknown by patient.   Allergies Allergies  Allergen Reactions   Penicillins Rash and Swelling    .Has patient had a PCN reaction causing immediate rash, facial/tongue/throat swelling, SOB or lightheadedness with hypotension: Unknown Has patient had a PCN reaction causing severe rash involving mucus membranes or skin necrosis: Unknown Has patient had a PCN reaction that required hospitalization: Unknown Has patient  had a PCN reaction occurring within the last 10 years: Unknown If all of the above answers are "NO", then may proceed with Cephalosporin use.    Sulfa Antibiotics Rash and Swelling     Home Medications  Prior to Admission medications   Medication Sig Start Date End Date Taking? Authorizing Provider  albuterol  (PROVENTIL ) (2.5 MG/3ML) 0.083% nebulizer solution Take 2.5 mg by nebulization QID.   Yes [provider]  albuterol  (VENTOLIN  HFA) 108 (90 Base) MCG/ACT inhaler Inhale 2 puffs into the lungs every 6 (six) hours as needed for wheezing or shortness of breath.   Yes [provider]  aspirin  EC 81 MG tablet Take 81 mg by mouth in the morning. Swallow whole.   Yes [provider]  divalproex  (DEPAKOTE  ER) 500 MG 24 hr tablet 1 tablet (500 mg) every morning and 2 tablets (1000 mg) at bedtime 08/19/23  Yes Alexander, Natalie, DO  escitalopram  (LEXAPRO ) 5 MG tablet Take 5 mg by mouth at bedtime. 06/05/21  Yes [provider]  ferrous sulfate ER (IRON SLOW RELEASE) 142 (45 Fe) MG TBCR tablet Take 45 mg by mouth in the morning.   Yes [provider]  folic acid  (FOLVITE ) 1 MG tablet Take 1 mg by mouth in the morning.   Yes [provider]  gabapentin  (NEURONTIN ) 100 MG capsule Take 100 mg by mouth 3 (three) times daily. 06/05/21  Yes [provider]  JARDIANCE  10 MG TABS tablet Take 10 mg by mouth in the morning.   Yes [provider]  Multiple Vitamin (MULTIVITAMIN WITH MINERALS) TABS tablet Take 1 tablet by mouth daily. 08/19/23  Yes Alexander, Natalie, DO  naloxone [facility]) nasal spray 4 mg/0.1 mL Place 1 spray into the nose once.   Yes [provider]  ondansetron  (ZOFRAN ) 4 MG tablet Take 1 tablet (4 mg total) by mouth every 6 (six) hours as needed for nausea. 08/19/23  Yes Alexander, Natalie, DO  polyethylene glycol (MIRALAX  / GLYCOLAX ) 17 g packet Take 17 g by mouth daily as needed for mild constipation. 08/19/23  Yes  Alexander, Natalie, DO  PRALUENT 150 MG/ML SOAJ Inject 150 mg into the skin as directed. Every 2 weeks in the morning   Yes [provider]  pyridOXINE  (VITAMIN B-6) 100 MG tablet Take 100 mg by mouth at bedtime.   Yes [provider]  QUEtiapine  (SEROQUEL ) 100 MG tablet Take 150 mg by mouth at bedtime. 06/05/21  Yes [provider]  QUEtiapine  (SEROQUEL ) 50 MG tablet Take 50 mg by mouth 2 (two) times daily. 50 mg in the morning (2) 50 mg at 2 pm 06/05/21  Yes [provider]  rosuvastatin  (CRESTOR ) 20 MG tablet Take 20 mg by mouth at bedtime. 06/05/21  Yes [provider]  thiamine  100 MG tablet Take 100 mg by mouth in the morning.   Yes [provider]  feeding supplement (ENSURE ENLIVE / ENSURE PLUS) LIQD Take 237 mLs by mouth  3 (three) times daily between meals. 08/19/23   Alexander, Natalie, DO     Critical care time: 40 minutes     Janey Meek, [facility]  Pulmonary/Critical Care Pager [phone] (please enter 7 digits) PCCM Consult Pager [phone] (please enter 7 digits)

## 2023-09-11 NOTE — Progress Notes (Signed)
 PHARMACY CONSULT NOTE  Pharmacy Consult for Electrolyte Monitoring and Replacement   Recent Labs: Potassium (mmol/L)  Date Value  09/11/2023 4.6   Magnesium (mg/dL)  Date Value  16/02/9603 2.7 (H)   Calcium  (mg/dL)  Date Value  54/01/8118 8.7 (L)   Albumin  (g/dL)  Date Value  14/78/2956 2.2 (L)   Phosphorus (mg/dL)  Date Value  21/30/8657 2.2 (L)   Sodium (mmol/L)  Date Value  09/11/2023 148 (H)   Assessment:  68 y.o. male with medical history significant of dementia, CKD stage IIIa, COPD, CHF, who presented to emergency department for chief concerns of respiratory distress. Pharmacy is asked to follow and replace electrolytes while in CCU  Goal of Therapy:  Electrolytes WNL  Plan:  --Na 148, defer management to PCCM --Phos 2.2, K Phos Neutral 500 mg BID per tube x 2 doses --Re-check electrolytes in AM  Page Boast 09/11/2023 7:37 AM

## 2023-09-11 NOTE — Plan of Care (Signed)
  Problem: Clinical Measurements: Goal: Diagnostic test results will improve Outcome: Progressing Goal: Signs and symptoms of infection will decrease Outcome: Progressing   Problem: Clinical Measurements: Goal: Ability to maintain clinical measurements within normal limits will improve Outcome: Progressing Goal: Will remain free from infection Outcome: Progressing   Problem: Nutrition: Goal: Adequate nutrition will be maintained Outcome: Progressing   Problem: Elimination: Goal: Will not experience complications related to bowel motility Outcome: Progressing Goal: Will not experience complications related to urinary retention Outcome: Progressing   Problem: Pain Managment: Goal: General experience of comfort will improve and/or be controlled Outcome: Progressing   Problem: Skin Integrity: Goal: Risk for impaired skin integrity will decrease Outcome: Progressing   Problem: Respiratory: Goal: Ability to maintain adequate ventilation will improve Outcome: Progressing   Problem: Fluid Volume: Goal: Ability to maintain a balanced intake and output will improve Outcome: Progressing   Problem: Skin Integrity: Goal: Risk for impaired skin integrity will decrease Outcome: Progressing

## 2023-09-11 NOTE — Progress Notes (Signed)
 Pharmacy Antibiotic Note  Paul Vaughan is a 68 y.o. male w/ PMH of DM, HLD, neuropathy, dementia, COPD, CHF, depression admitted on 08/31/2023 with pneumonia.  Pharmacy has been consulted for vancomycin  and cefepime  dosing.  Plan:  Adjust vancomycin  to 1000 mg q12h Goal AUC 400-550. Expected AUC: 497, Cmin: 14 SCr 0.84, Vd 0.72, IBW   Height: 5' 7.99" (172.7 cm) Weight: 77.6 kg (171 lb 1.2 oz) IBW/kg (Calculated) : 68.38  Temp (24hrs), Avg:98.4 F (36.9 C), Min:97.9 F (36.6 C), Max:98.8 F (37.1 C)  Recent Labs  Lab 09/07/23 0341 09/08/23 0218 09/09/23 0309 09/10/23 0540 09/11/23 0307  WBC 16.6* 19.2* 19.6* 16.8* 13.2*  CREATININE 1.40* 1.29* 1.30* 1.19 0.84    Estimated Creatinine Clearance: 81.4 mL/min (by C-G formula based on SCr of 0.84 mg/dL).    Allergies  Allergen Reactions   Penicillins Rash and Swelling    .Has patient had a PCN reaction causing immediate rash, facial/tongue/throat swelling, SOB or lightheadedness with hypotension: Unknown Has patient had a PCN reaction causing severe rash involving mucus membranes or skin necrosis: Unknown Has patient had a PCN reaction that required hospitalization: Unknown Has patient had a PCN reaction occurring within the last 10 years: Unknown If all of the above answers are "NO", then may proceed with Cephalosporin use.    Sulfa Antibiotics Rash and Swelling    Antimicrobials this admission: 04/19 azithromycin  >> 04/23 04/23 ceftriaxone  >> 04/24, 4/29 >> 04/23 metronidazole  >> 04/26 04/19 cefepime  >> 04/23  04/28 >> 4/29 04/28 vancomycin  >>  Microbiology results: 04/19 BCx: NG final 04/23 Sputum: NRF 04/24 Sputum: NRF 04/26 Sputum: pending  04/28 MRSA PCR: positive  Thank you for allowing pharmacy to be a part of this patient's care.  Shams Fill B Alyzabeth Pontillo 09/11/2023 7:42 AM

## 2023-09-11 NOTE — Progress Notes (Addendum)
 Daily Progress Note   Patient Name: Paul Vaughan       Date: 09/11/2023 DOB: Aug 12, 1955  Age: 68 y.o. MRN#: [id number] Attending Physician: [name], MD Primary Care Physician: Meri Stammer, NP Admit Date: 08/31/2023  Reason for Consultation/Follow-up: Establishing goals of care  Subjective: Notes and labs reviewed.  Into see patient.  He is currently resting in bed on ventilator support.  No family at bedside.  Spoke with CCM regarding concerns for overall prognosis.  CCM advises they talked to legal guardian [name].  Discussion of medical optimization in preparation for one-way extubation.  No trach and no PEG.  PMT has had similar discussions.  Spoke with DSS caseworker Zyannah who is aware of CCM's conversation with [name].  PMT will follow along for needs.  Length of Stay: 11  Current Medications: Scheduled Meds:   acetylcysteine   4 mL Nebulization BID   Chlorhexidine  Gluconate Cloth  6 each Topical Q1400   docusate  100 mg Per Tube BID   escitalopram   5 mg Per Tube QHS   free water  200 mL Per Tube Q6H   gabapentin   100 mg Per Tube Q8H   heparin  injection (subcutaneous)  5,000 Units Subcutaneous Q8H   hydrocortisone sod succinate (SOLU-CORTEF) inj  50 mg Intravenous Q6H   insulin aspart  0-20 Units Subcutaneous Q4H   insulin aspart  5 Units Subcutaneous Q4H   insulin glargine-yfgn  15 Units Subcutaneous BID   ipratropium-albuterol   3 mL Nebulization TID   mupirocin ointment  1 Application Nasal BID   nystatin   5 mL Oral QID   mouth rinse  15 mL Mouth Rinse Q2H   pantoprazole  (PROTONIX ) IV  40 mg Intravenous Q12H   phosphorus  500 mg Per Tube BID   polyethylene glycol  17 g Per Tube Daily   QUEtiapine   150 mg Per Tube QHS   QUEtiapine   50 mg Per Tube  2 times per day   valproic acid  500 mg Per Tube TID    Continuous Infusions:  cefTRIAXone  (ROCEPHIN )  IV Stopped (09/11/23 1212)   feeding supplement (VITAL AF 1.2 CAL) 1,000 mL (09/11/23 1324)   fentaNYL  infusion INTRAVENOUS 175 mcg/hr (09/11/23 1259)   norepinephrine  (LEVOPHED ) Adult infusion Stopped (09/10/23 0819)   propofol  (DIPRIVAN ) infusion 30 mcg/kg/min (09/11/23 1259)  vancomycin       PRN Meds: bisacodyl, fentaNYL , liver oil-zinc  oxide, mouth rinse  Physical Exam Constitutional:      Comments: Eyes closed  Pulmonary:     Comments: On ventilator            Vital Signs: BP (!) 97/53   Pulse 96   Temp 98.6 F (37 C) (Axillary)   Resp 18   Ht 5' 7.99" (1.727 m)   Wt 77.6 kg   SpO2 96%   BMI 26.02 kg/m  SpO2: SpO2: 96 % O2 Device: O2 Device: Ventilator O2 Flow Rate: O2 Flow Rate (L/min): 50 L/min  Intake/output summary:  Intake/Output Summary (Last 24 hours) at 09/11/2023 1424 Last data filed at 09/11/2023 1259 Gross per 24 hour  Intake 1877.35 ml  Output 1050 ml  Net 827.35 ml   LBM: Last BM Date : 09/11/23 Baseline Weight: Weight: 77 kg Most recent weight: Weight: 77.6 kg    Patient Active Problem List   Diagnosis Date Noted   Malnutrition of moderate degree 09/07/2023   Acute pulmonary embolism (HCC) 09/01/2023   Severe sepsis with acute organ dysfunction (HCC) 08/31/2023   Acute on chronic hypoxic respiratory failure (HCC) 08/31/2023   Multifocal pneumonia 07/29/2023   Chronic diastolic CHF (congestive heart failure) (HCC) 07/29/2023   Rhabdomyolysis 07/29/2023   Acute hypoxic respiratory failure (HCC) 07/28/2023   Elevated LFTs 07/28/2023   Thrombocytopenia (HCC) 07/01/2021   Anemia of chronic disease 07/01/2021   AKI (acute kidney injury) (HCC) 07/01/2021   Severe sepsis (HCC) 06/30/2021   Acute hypoxemic respiratory failure due to COVID-19 (HCC) 06/30/2021   Chronic depression 06/08/2021   Chronic obstructive pulmonary disease,  unspecified (HCC) 06/08/2021   Essential (primary) hypertension 06/08/2021   Dementia with behavioral disturbance (HCC) 06/08/2021   Proteinuria, unspecified 06/08/2021   Hyperlipidemia, unspecified 06/08/2021   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 PMT will follow along.    Code Status:    Code Status Orders  (From admission, onward)           Start     Ordered   09/09/23 1510  Do not attempt resuscitation (DNR) Pre-Arrest Interventions Desired  (Code Status)  Continuous       Question Answer Comment  If pulseless and not breathing No CPR or chest compressions.   In Pre-Arrest Conditions (Patient Has Pulse and Is Breathing) May intubate, use advanced airway interventions and cardioversion/ACLS medications if appropriate or indicated. May transfer to ICU.   Consent: Discussion documented in EHR or advanced directives reviewed      09/09/23 1509           Code Status History     Date Active Date Inactive Code Status Order ID Comments User Context   08/31/2023 1741 09/09/2023 1509 Full Code [id number]  Cedric Cohn, DO Inpatient   08/31/2023 1421 08/31/2023 1740 Full Code [id number]  Cedric Cohn, DO ED   07/28/2023 1353 08/19/2023 1706 Full Code [id number]  Avi Body, MD ED   06/30/2021 1239 07/06/2021 1904 Full Code [id number]  Read Camel, MD ED   11/20/2019 0652 12/14/2019 2059 Full Code [id number]  Isa Manuel, MD ED       Prognosis: Poor   Care plan was discussed with CCM  Thank you for allowing the Palliative Medicine Team  to assist in the care of this patient.    Meribeth Standard, NP  Please contact Palliative Medicine Team phone at [phone] for questions and concerns.

## 2023-09-11 NOTE — Progress Notes (Signed)
 Nutrition Follow Up Note   DOCUMENTATION CODES:   Non-severe (moderate) malnutrition in context of chronic illness  INTERVENTION:   Vital 1.2@65ml /hr continuous   Free water flushes q6 hours per MD  Regimen provides 1872kcal/day, 117g/day protein and 2065ml/day of free water.   Thiamine  100mg  daily via tube x 7 days   Pt remains at high refeed risk; recommend monitor potassium, magnesium and phosphorus labs daily until stable  Daily weights   NUTRITION DIAGNOSIS:   Moderate Malnutrition related to chronic illness as evidenced by moderate fat depletion, moderate muscle depletion. -ongoing   GOAL:   Provide needs based on ASPEN/SCCM guidelines -met   MONITOR:   Vent status, Labs, Weight trends, Skin, I & O's  ASSESSMENT:   68 year old male with history of non-insulin-dependent diabetes mellitus, HTN, depression, CKD, hyperlipidemia, neuropathy, intellectual learning difficulty/dementia, COPD and CHF who is admitted with PNA, sepsis, AKI and PE.  Pt remains sedated and ventilated. OGT in place and pt is tolerating tube feeds at goal rate. Pt is actively refeeding; electrolytes being monitored and supplemented. Pt with mild hypernatremia; free water increased. Per chart, pt is up ~7lbs since admission but appears to be around his UBW currently. Pt +10.7L on his I & Os. Plan is for possible one-way extubation.   Medications reviewed and include: heparin , solu-cortef, insulin, protonix , Kphos, miralax , ceftriaxone , propofol , vancomycin    Labs reviewed: Na 148(H), K 4.6 wnl, BUN 84(H), P 2.2(L), Mg 2.7(H) Wbc- 13.2(H), Hgb 8.4(L), Hct 25.1(L) Cbgs- 161, 175, 162 x 24 hrs    Patient is currently intubated on ventilator support MV: 16.5 L/min Temp (24hrs), Avg:98.5 F (36.9 C), Min:97.9 F (36.6 C), Max:98.8 F (37.1 C)  Propofol : 13.1 ml/hr- provides 346kcal/day  MAP >25mmHg   UOP-   Diet Order:   Diet Order             Diet NPO time specified  Diet  effective now                  EDUCATION NEEDS:   No education needs have been identified at this time  Skin:  Skin Assessment: Reviewed RN Assessment (ecchymosis)  Last BM:  4/30- type 7  Height:   Ht Readings from Last 1 Encounters:  09/09/23 5' 7.99" (1.727 m)    Weight:   Wt Readings from Last 1 Encounters:  09/11/23 77.6 kg    Ideal Body Weight:  70 kg  BMI:  Body mass index is 26.02 kg/m.  Estimated Nutritional Needs:   Kcal:  1959kcal/day  Protein:  110-125g/day  Fluid:  1.8-2.1L/day  Torrance Freestone MS, RD, LDN If unable to be reached, please send secure chat to "RD inpatient" available from 8:00a-4:00p daily

## 2023-09-11 NOTE — IPAL (Signed)
  Interdisciplinary Goals of Care Family Meeting   Date carried out: 09/11/2023  Location of the meeting: Phone conference  Member's involved: Nurse Practitioner and Other: Legal Guardian  Durable Power of Attorney or acting medical decision maker: Legal Guardian [name]    Discussion: We discussed goals of care for Monica Ann.  Following discussion Ms. Goble agreeable with the PCCM team continuing to optimize Mr. Morrisette for a 1-way extubation over the next few days.  If pt fails extubation will transition to Comfort Measures Only.  Ms.Goble stated DSS has decided NOT to pursue a tracheostomy or PEG tube   Code status:   Code Status: Do not attempt resuscitation (DNR) PRE-ARREST INTERVENTIONS DESIRED   Disposition: Continue current acute care  Time spent for the meeting: 10 minutes     Janey Meek, AGNP  Pulmonary/Critical Care Pager [phone] (please enter 7 digits) PCCM Consult Pager [phone] (please enter 7 digits)

## 2023-09-11 NOTE — TOC Progression Note (Signed)
 Transition of Care [facility]) - Progression Note    Patient Details  Name: Paul Paul MRN: [id number] Date of Birth: 1955-08-30  [facility] CM/SW Contact  Zoe Hinds, RN Phone Number: 09/11/2023, 5:12 PM  Clinical Narrative:     Per chart review Pt is HOD# 11 remains   intubated and is not medically ready to dc today.  pt remains on  antibiotics, Ventilatory requirements have improved and he is currently on minimal vent . Medical team discussed goals of care & plans to  wean off vent   Ms. Goble(Guardian) agreeable  If pt  not able to be  extubated plan will  be to transition to Comfort Measures Only.  Ms. Myrle Aspen stated DSS has decided NOT to pursue a tracheostomy or PEG tube. TOC will cont to follow dc planning / care coordination and update as applicable.    Expected Discharge Plan: Skilled Nursing Facility Barriers to Discharge: Continued Medical Work up  Expected Discharge Plan and Services   Discharge Planning Services: CM Consult Post Acute Care Choice: Skilled Nursing Facility Living arrangements for the past 2 months: Skilled Nursing Facility                                       Social Determinants of Health (SDOH) Interventions SDOH Screenings   Food Insecurity: Patient Unable To Answer (08/31/2023)  Housing: Patient Unable To Answer (09/01/2023)  Transportation Needs: Patient Unable To Answer (08/31/2023)  Utilities: Patient Unable To Answer (08/31/2023)  Social Connections: Patient Unable To Answer (08/31/2023)  Tobacco Use: High Risk (08/31/2023)    Readmission Risk Interventions    07/01/2021    1:02 PM  Readmission Risk Prevention Plan  Transportation Screening Complete  PCP or Specialist Appt within 5-7 Days Complete  Home Care Screening Complete  Medication Review (RN CM) Complete

## 2023-09-12 ENCOUNTER — Inpatient Hospital Stay

## 2023-09-12 DIAGNOSIS — J9601 Acute respiratory failure with hypoxia: Secondary | ICD-10-CM | POA: Diagnosis not present

## 2023-09-12 DIAGNOSIS — R6520 Severe sepsis without septic shock: Secondary | ICD-10-CM | POA: Diagnosis not present

## 2023-09-12 DIAGNOSIS — J90 Pleural effusion, not elsewhere classified: Secondary | ICD-10-CM | POA: Diagnosis not present

## 2023-09-12 DIAGNOSIS — Z7189 Other specified counseling: Secondary | ICD-10-CM | POA: Diagnosis not present

## 2023-09-12 DIAGNOSIS — A419 Sepsis, unspecified organism: Secondary | ICD-10-CM | POA: Diagnosis not present

## 2023-09-12 DIAGNOSIS — 419620001 Death: Secondary | SNOMED CT | POA: Diagnosis not present

## 2023-09-12 DIAGNOSIS — J189 Pneumonia, unspecified organism: Secondary | ICD-10-CM | POA: Diagnosis not present

## 2023-09-12 DIAGNOSIS — I2699 Other pulmonary embolism without acute cor pulmonale: Secondary | ICD-10-CM | POA: Diagnosis not present

## 2023-09-12 LAB — BASIC METABOLIC PANEL WITH GFR
Anion gap: 7 (ref 5–15)
BUN: 74 mg/dL — ABNORMAL HIGH (ref 8–23)
CO2: 33 mmol/L — ABNORMAL HIGH (ref 22–32)
Calcium: 8.3 mg/dL — ABNORMAL LOW (ref 8.9–10.3)
Chloride: 109 mmol/L (ref 98–111)
Creatinine, Ser: 0.83 mg/dL (ref 0.61–1.24)
GFR, Estimated: >60 mL/min (ref >60)
Glucose, Bld: 171 mg/dL — ABNORMAL HIGH (ref 70–99)
Potassium: 4 mmol/L (ref 3.5–5.1)
Sodium: 149 mmol/L — ABNORMAL HIGH (ref 135–145)

## 2023-09-12 LAB — CBC WITH DIFFERENTIAL/PLATELET
Abs Immature Granulocytes: 0.81 10*3/uL — ABNORMAL HIGH (ref 0.00–0.07)
Basophils Absolute: 0 10*3/uL (ref 0.0–0.1)
Basophils Relative: 0 %
Eosinophils Absolute: 0 10*3/uL (ref 0.0–0.5)
Eosinophils Relative: 0 %
HCT: 26.3 % — ABNORMAL LOW (ref 39.0–52.0)
Hemoglobin: 8.6 g/dL — ABNORMAL LOW (ref 13.0–17.0)
Immature Granulocytes: 9 %
Lymphocytes Relative: 18 %
Lymphs Abs: 1.6 10*3/uL (ref 0.7–4.0)
MCH: 32.7 pg (ref 26.0–34.0)
MCHC: 32.7 g/dL (ref 30.0–36.0)
MCV: 100 fL (ref 80.0–100.0)
Monocytes Absolute: 0.8 10*3/uL (ref 0.1–1.0)
Monocytes Relative: 9 %
Neutro Abs: 5.8 10*3/uL (ref 1.7–7.7)
Neutrophils Relative %: 64 %
Platelets: 171 10*3/uL (ref 150–400)
RBC: 2.63 MIL/uL — ABNORMAL LOW (ref 4.22–5.81)
RDW: 16.2 % — ABNORMAL HIGH (ref 11.5–15.5)
Smear Review: NORMAL
WBC: 9.1 10*3/uL (ref 4.0–10.5)
nRBC: 0.4 % — ABNORMAL HIGH (ref 0.0–0.2)

## 2023-09-12 LAB — PHOSPHORUS: Phosphorus: 3.4 mg/dL (ref 2.5–4.6)

## 2023-09-12 LAB — GLUCOSE, CAPILLARY
Glucose-Capillary: 125 mg/dL — ABNORMAL HIGH (ref 70–99)
Glucose-Capillary: 142 mg/dL — ABNORMAL HIGH (ref 70–99)
Glucose-Capillary: 153 mg/dL — ABNORMAL HIGH (ref 70–99)
Glucose-Capillary: 161 mg/dL — ABNORMAL HIGH (ref 70–99)
Glucose-Capillary: 164 mg/dL — ABNORMAL HIGH (ref 70–99)
Glucose-Capillary: 179 mg/dL — ABNORMAL HIGH (ref 70–99)
Glucose-Capillary: 99 mg/dL (ref 70–99)

## 2023-09-12 LAB — MAGNESIUM: Magnesium: 2.4 mg/dL (ref 1.7–2.4)

## 2023-09-12 MED ORDER — ORAL CARE MOUTH RINSE
15.0000 mL | OROMUCOSAL | Status: DC
  Administered 2023-09-12 – 2023-09-24 (×44): 15 mL via OROMUCOSAL

## 2023-09-12 MED ORDER — ORAL CARE MOUTH RINSE
15.0000 mL | OROMUCOSAL | Status: DC | PRN
  Administered 2023-09-24: 15 mL via OROMUCOSAL

## 2023-09-12 MED ORDER — HYDRALAZINE HCL 20 MG/ML IJ SOLN
10.0000 mg | INTRAMUSCULAR | Status: DC | PRN
  Administered 2023-09-12: 10 mg via INTRAVENOUS
  Filled 2023-09-12: qty 1

## 2023-09-12 NOTE — Plan of Care (Signed)
  Problem: Fluid Volume: Goal: Hemodynamic stability will improve Outcome: Progressing   Problem: Clinical Measurements: Goal: Diagnostic test results will improve Outcome: Progressing Goal: Signs and symptoms of infection will decrease Outcome: Progressing   Problem: Respiratory: Goal: Ability to maintain adequate ventilation will improve Outcome: Progressing   Problem: Education: Goal: Knowledge of General Education information will improve Description: Including pain rating scale, medication(s)/side effects and non-pharmacologic comfort measures Outcome: Progressing   Problem: Health Behavior/Discharge Planning: Goal: Ability to manage health-related needs will improve Outcome: Progressing   Problem: Clinical Measurements: Goal: Ability to maintain clinical measurements within normal limits will improve Outcome: Progressing Goal: Will remain free from infection Outcome: Progressing Goal: Diagnostic test results will improve Outcome: Progressing Goal: Respiratory complications will improve Outcome: Progressing Goal: Cardiovascular complication will be avoided Outcome: Progressing   Problem: Activity: Goal: Risk for activity intolerance will decrease Outcome: Progressing   Problem: Nutrition: Goal: Adequate nutrition will be maintained Outcome: Progressing   Problem: Coping: Goal: Level of anxiety will decrease Outcome: Progressing   Problem: Elimination: Goal: Will not experience complications related to bowel motility Outcome: Progressing Goal: Will not experience complications related to urinary retention Outcome: Progressing   Problem: Pain Managment: Goal: General experience of comfort will improve and/or be controlled Outcome: Progressing   Problem: Safety: Goal: Ability to remain free from injury will improve Outcome: Progressing   Problem: Skin Integrity: Goal: Risk for impaired skin integrity will decrease Outcome: Progressing   Problem:  Activity: Goal: Ability to tolerate increased activity will improve Outcome: Progressing   Problem: Clinical Measurements: Goal: Ability to maintain a body temperature in the normal range will improve Outcome: Progressing   Problem: Respiratory: Goal: Ability to maintain adequate ventilation will improve Outcome: Progressing Goal: Ability to maintain a clear airway will improve Outcome: Progressing   Problem: Activity: Goal: Ability to tolerate increased activity will improve Outcome: Progressing   Problem: Respiratory: Goal: Ability to maintain a clear airway and adequate ventilation will improve Outcome: Progressing   Problem: Role Relationship: Goal: Method of communication will improve Outcome: Progressing   Problem: Education: Goal: Ability to describe self-care measures that may prevent or decrease complications (Diabetes Survival Skills Education) will improve Outcome: Progressing Goal: Individualized Educational Video(s) Outcome: Progressing   Problem: Coping: Goal: Ability to adjust to condition or change in health will improve Outcome: Progressing   Problem: Fluid Volume: Goal: Ability to maintain a balanced intake and output will improve Outcome: Progressing   Problem: Health Behavior/Discharge Planning: Goal: Ability to identify and utilize available resources and services will improve Outcome: Progressing Goal: Ability to manage health-related needs will improve Outcome: Progressing   Problem: Metabolic: Goal: Ability to maintain appropriate glucose levels will improve Outcome: Progressing   Problem: Nutritional: Goal: Maintenance of adequate nutrition will improve Outcome: Progressing Goal: Progress toward achieving an optimal weight will improve Outcome: Progressing   Problem: Skin Integrity: Goal: Risk for impaired skin integrity will decrease Outcome: Progressing   Problem: Tissue Perfusion: Goal: Adequacy of tissue perfusion will  improve Outcome: Progressing

## 2023-09-12 NOTE — Procedures (Signed)
 Extubation Procedure Note  Patient Details:   Name: Kris Teasdale DOB: 03-Feb-1956 MRN: [id number]   Airway Documentation:    Vent end date: 09/12/23 Vent end time: 1420   Evaluation  O2 sats: stable throughout Complications: No apparent complications Patient did tolerate procedure well. Bilateral Breath Sounds: Clear, Diminished   Yes able to cough.  Arna Better Tian Mcmurtrey 09/12/2023, 2:24 PM

## 2023-09-12 NOTE — Progress Notes (Signed)
 NAME:  Paul Vaughan, MRN:  [id number], DOB:  16-Mar-1956, LOS: 12 ADMISSION DATE:  08/31/2023, CONSULTATION DATE:  09/02/23 REFERRING MD:  Dr Ariel Begun, CHIEF COMPLAINT:  Severe Sepsis with Septic shock   Brief Pt Description / Synopsis:  68 y.o male with PMHx significant for COPD on baseline 4L [place], HFpEF, intellectual learning difficultly, dementia, HLD, and diabetes mellitus who is admitted with Acute Metabolic Encephalopathy and Acute Hypoxic Respiratory Failure in the setting of Multifocal Pneumonia, Pulmonary Embolism, and Acute Decompensated HFmrEF.  Required intubation and mechanical ventilation.  History of Present Illness:  Paul Vaughan is a 68 year old male with history of non-insulin -dependent diabetes mellitus, hyperlipidemia, neuropathy, intellectual learning difficulty/dementia, COPD, on baseline 4 L nasal cannula, heart failure preserved ejection fraction, who presents emergency department for chief concerns of respiratory distress noticed by nursing staff.   Of note, he was recently admitted from 3/16 till 08/19/2023 when presented with generalized weakness, treated for pneumonia and rhabdo. Patient did had worsening respiratory status during that admission which was stabilizes and he was discharged on 4 L of oxygen to SNF.   ED Course: Initial Vital Signs: temperature of 101.4, respiration rate of 25, heart rate of 130, blood pressure 104/77, SpO2 of 95% on BiPAP  Significant Labs: sodium is 136, potassium 5.0, chloride 100, bicarb 23, BUN 36, serum creatinine 1.39, EGFR 55, nonfasting blood glucose 126, WBC 9.4, hemoglobin 12.4, platelets of 419.BNP was elevated at 486.2.  High sensitive troponin was 14.  Lactic acid was 5.7 and on repeat 1 hour later was 5.9. COVID/influenza A/influenza B/RSV PCR were negative. Blood cultures x 2  Imaging Chest X-ray>>IMPRESSION: Improved aeration at the right apex but new left perihilar and right basilar opacities compared to prior,  suspicious for bilateral pneumonia. CTa Chest>>IMPRESSION: 1. Pulmonary embolus within the right superior pulmonary artery. 2. Patchy airspace opacities bilaterally with consolidative changes posteriorly in the left upper lobe. Findings are compatible with multifocal pneumonia. 3. Mild mediastinal adenopathy, likely reactive. Medications Administered: DuoNebs one-time treatment, acetaminophen  1000 mg IV one-time dose, azithromycin  500 mg IV one-time dose, ceftriaxone  2 g IV one-time dose, LR 2.5 mL liter bolus were given, and vancomycin  per pharmacy   Hospitalists were asked to admit for further workup and treatment.  Please see "Significant Hospital Events" section below for full detailed hospital course.   Pertinent  Medical History   Past Medical History:  Diagnosis Date   CHF (congestive heart failure) (HCC)    Chronic kidney disease    COPD (chronic obstructive pulmonary disease) (HCC)    Dementia (HCC)     Micro Data:  4/19: SARS-CoV-2/flu/RSV PCR>> negative 4/19: Blood culture x 2>> no growth 4/19: MRSA PCR>>Negative  4/20: RVP>> negative 4/20: HIV>>nonreactive 4/20: Strep pneumo and Legionella urine antigens>> negative 4/23: Tracheal aspirate>> normal respiratory flora 4/24: BAL>> normal respiratory flora 4/26: BAL>> normal respiratory flora 4/28: MRSA PCR +  Antimicrobials:   Anti-infectives (From admission, onward)    Start     Dose/Rate Route Frequency Ordered Stop   09/11/23 2200  vancomycin  (VANCOCIN ) IVPB 1000 mg/200 mL premix        1,000 mg 200 mL/hr over 60 Minutes Intravenous Every 12 hours 09/11/23 0741     09/11/23 0700  vancomycin  (VANCOCIN ) 1,250 mg in sodium chloride  0.9 % 250 mL IVPB  Status:  Discontinued        1,250 mg 175 mL/hr over 90 Minutes Intravenous Every 24 hours 09/11/23 0632 09/11/23 0741   09/10/23 1200  cefTRIAXone  (ROCEPHIN ) 2  g in sodium chloride  0.9 % 100 mL IVPB        2 g 200 mL/hr over 30 Minutes Intravenous Every 24 hours  09/10/23 1042     09/10/23 0600  vancomycin  (VANCOREADY) IVPB 1250 mg/250 mL  Status:  Discontinued        1,250 mg 166.7 mL/hr over 90 Minutes Intravenous Every 24 hours 09/09/23 1336 09/11/23 0632   09/09/23 1400  vancomycin  (VANCOREADY) IVPB 1500 mg/300 mL        1,500 mg 150 mL/hr over 120 Minutes Intravenous  Once 09/09/23 1226 09/09/23 1545   09/09/23 1200  ceFEPIme  (MAXIPIME ) 2 g in sodium chloride  0.9 % 100 mL IVPB  Status:  Discontinued        2 g 200 mL/hr over 30 Minutes Intravenous Every 12 hours 09/09/23 1043 09/10/23 1041   09/06/23 1800  metroNIDAZOLE  (FLAGYL ) IVPB 500 mg  Status:  Discontinued        500 mg 100 mL/hr over 60 Minutes Intravenous Every 12 hours 09/06/23 1224 09/07/23 1046   09/06/23 1700  ceFEPIme  (MAXIPIME ) 2 g in sodium chloride  0.9 % 100 mL IVPB  Status:  Discontinued        2 g 200 mL/hr over 30 Minutes Intravenous Every 12 hours 09/06/23 1225 09/08/23 1040   09/04/23 1630  metroNIDAZOLE  (FLAGYL ) IVPB 500 mg  Status:  Discontinued        500 mg 100 mL/hr over 60 Minutes Intravenous Every 12 hours 09/04/23 1542 09/06/23 1209   09/03/23 1830  cefTRIAXone  (ROCEPHIN ) 2 g in sodium chloride  0.9 % 100 mL IVPB  Status:  Discontinued        2 g 200 mL/hr over 30 Minutes Intravenous Every 24 hours 09/03/23 1636 09/06/23 1209   09/01/23 1200  cefTRIAXone  (ROCEPHIN ) 2 g in sodium chloride  0.9 % 100 mL IVPB  Status:  Discontinued        2 g 200 mL/hr over 30 Minutes Intravenous Every 24 hours 08/31/23 1421 08/31/23 1427   09/01/23 1200  azithromycin  (ZITHROMAX ) 500 mg in sodium chloride  0.9 % 250 mL IVPB        500 mg 250 mL/hr over 60 Minutes Intravenous Every 24 hours 08/31/23 1421 09/04/23 1443   09/01/23 1000  ceFEPIme  (MAXIPIME ) 2 g in sodium chloride  0.9 % 100 mL IVPB  Status:  Discontinued        2 g 200 mL/hr over 30 Minutes Intravenous Every 8 hours 09/01/23 0841 09/03/23 1633   09/01/23 0000  ceFEPIme  (MAXIPIME ) 2 g in sodium chloride  0.9 % 100 mL IVPB   Status:  Discontinued        2 g 200 mL/hr over 30 Minutes Intravenous Every 12 hours 08/31/23 1444 09/01/23 0841   08/31/23 1530  vancomycin  (VANCOREADY) IVPB 750 mg/150 mL        750 mg 150 mL/hr over 60 Minutes Intravenous  Once 08/31/23 1441 08/31/23 1700   08/31/23 1445  vancomycin  variable dose per unstable renal function (pharmacist dosing)  Status:  Discontinued         Does not apply See admin instructions 08/31/23 1445 09/01/23 0805   08/31/23 1145  vancomycin  (VANCOCIN ) IVPB 1000 mg/200 mL premix        1,000 mg 200 mL/hr over 60 Minutes Intravenous  Once 08/31/23 1140 08/31/23 1509   08/31/23 1145  ceFEPIme  (MAXIPIME ) 2 g in sodium chloride  0.9 % 100 mL IVPB        2 g 200 mL/hr  over 30 Minutes Intravenous  Once 08/31/23 1140 08/31/23 1248   08/31/23 1145  azithromycin  (ZITHROMAX ) 500 mg in sodium chloride  0.9 % 250 mL IVPB        500 mg 250 mL/hr over 60 Minutes Intravenous  Once 08/31/23 1140 08/31/23 1311       Significant Hospital Events: Including procedures, antibiotic start and stop dates in addition to other pertinent events   4/20: Afebrile with mild tachycardia, remained on BiPAP.  Patient recently treated for multifocal pneumonia.  Also noted significantly elevated CK meeting criteria for rhabdomyolysis with no history of fall.  CK improved with IV fluid, seems like having chronic intermittently elevated CK. MRSA PCR negative so discontinuing vancomycin .  CTA chest was done due to the significant hypoxia, had a small PE and right upper lobe-started on heparin  infusion.  Did show multifocal pneumonia.  Procalcitonin significantly elevated at 15.77.  Ordered expanded respiratory panel, strep pneumo and Legionella antigen. 4/21: Vital stable but remained on BiPAP.  RVP negative, strep pneumo negative, lactic acidosis resolved.  Labs seems stable.  Patient becoming tachypneic and requiring up to 15 L of oxygen when trying to wean off from BiPAP.  PCCM was also consulted.   Echocardiogram done-pending results 09/04/23- patient with increased O2 req now on elevated HFNC settings. High risk for intubation.  Palliative care is following 09/05/23-patient continues to have thick mucus per ETT and blood.  I will reach out to DSS to discuss his goals of care and if remains full scope may need to have EGD and bronchoscopy.  09/06/23- patient on 45% PRVC, blood per ETT has subsided on pressor support.  No Blood per OGT on LIS.  We will start nourishment today.. 09/07/23 - patient continues to desaturate to <70% with heavy mucus plugging and thick secretions from ETT.  Plan to bronch today.  We reached approval for [facility] but currently patient is unstable for transfer. Obtained consent for bronchoscopy from [name] - director of DSS.  09/08/23- patient on 60% FiO2 PRVC, completed 9d abx.  No BM for 6d with +abd distension, for KUB today.  09/09/23- No significant events noted overnight.  Remains on high vent support (50% FiO2 & 10 PEEP). Requiring Levophed  (currently 5 mcg).  Given lack of clinical improvement with persistent leukocytosis, BAL cultures pending, will restart Cefepime  and start Vancomycin  as MRSA PCR is +.  No further reports of bleeding, plan to restart Heparin  gtt. 09/10/23- No significant events overnight, vent support slowly weaning (40% FiO2 & 8 PEEP).  Perform WUA and SBT as tolerated.  Became agitated and encephalopathic with desaturation and severe hypertension, therefore SBT aborted.  No reports of bleeding, but Hgb decreased to 6.9 from 8.1, will hold Heparin  gtt and transfuse 1 unit PRBC's.  Given improvement with initiation of ABX yesterday, will continue with Vancomycin  (MRSA PCR +), change Cefepime  to Ceftriaxone  given no pseudomonas on multiple cultures. 09/11/23: Pt remains mechanically intubated failed SBT due to increased work of breathing and hypoxia.   09/12/23: Will perform SBT if respiratory status stable will perform 1-way extubation   Interim  History / Subjective:  As outlined above under "Significant Hospital Events" section  Objective   Blood pressure (!) 112/57, pulse 90, temperature 98.6 F (37 C), temperature source Oral, resp. rate 16, height 5' 7.99" (1.727 m), weight 79.9 kg, SpO2 95%.    Vent Mode: PSV;CPAP FiO2 (%):  [40 %] 40 % Set Rate:  [22 bmp] 22 bmp Vt Set:  [500 mL]  500 mL PEEP:  [5 cmH20-8 cmH20] 5 cmH20 Pressure Support:  [5 cmH20] 5 cmH20 Plateau Pressure:  [19 cmH20] 19 cmH20   Intake/Output Summary (Last 24 hours) at 09/12/2023 0908 Last data filed at 09/12/2023 0600 Gross per 24 hour  Intake 2057.71 ml  Output 1350 ml  Net 707.71 ml   Filed Weights   09/10/23 0457 09/11/23 0314 09/12/23 0500  Weight: 76.4 kg 77.6 kg 79.9 kg    Examination: General: Acute on chronically ill-appearing male, laying in bed, sedated and intubated NAD  HENT: Atraumatic, normocephalic, neck supple, no JVD, orally intubated Lungs: Faint rhonchi throughout, even, non labored  Cardiovascular: NSR, s1s2, no m/r/g, 2+ radial/1+ distal pulses, mild edema  Abdomen: +BS x4, soft, non distended  Extremities: Normal bulk and tone, no deformities Neuro: Sedated, awake, moves all extremities spontaneously, unable to follow commands, PERRL GU: External urinary catheter   Resolved Hospital Problem list     Assessment & Plan:   #Acute Metabolic Encephalopathy #Sedation needs in setting of mechanical ventilation - Treatment of sepsis and metabolic derangements as outlined above - Maintain a RASS goal of 0 to -1 - PAD protocol to maintain RASS goal: propofol  and fentanyl  as needed to maintain RASS goal - Avoid sedating medications as able - Daily wake up assessment - Continue seroquel  and valproic  acid  #Acute Hypoxic Respiratory Failure  #Multifocal Pneumonia~Aspiration #Pulmonary Embolism #Pulmonary Edema & Pleural effusion #Mucus plugging Hx: COPD on baseline 4L [place] CTA Chest on 4/20: revealed PE in the right superior  pulmonary artery, along with patchy airspace opacities bilaterally with consolidate changes in the LUL consistent with multifocal pneumonia - Full vent support, implement lung protective strategies - Plateau pressures less than 30 cm H20 - Wean FiO2 & PEEP as tolerated to maintain O2 sats >92% - Follow intermittent CXR & ABG's as needed - Spontaneous Breathing Trials when respiratory parameters met and mental status permits - Implement VAP Bundle - Continue mucomyst   - Scheduled bronchodilator  - IV steroids wean as tolerated  - Status post Bronchoscopy x2  #Hypotension: septic shock +/- sedation related #Acute decompensated HFmrEF Echocardiogram 09/02/23: LVEF 45-50%, mild LVH, indeterminate diastolic parameters, RV not well visualized - Continuous telemetry monitoring - Maintain map 65 or higher  - Transfusions as indicated - Lactic acid has normalized - HS Troponin negative x2 - Diuresis as BP and renal function permits~currently holding due to AKI and vasopressor requirements  #AKI~resolved  #Hypernatremia  #Hypophosphatemia~resolved   - Trend BMP  - Replace electrolytes as indicated ~ Pharmacy following for assistance with electrolyte replacement - Strict I&O's - Avoid nephrotoxic agents - Increased free water  flushes to 200 ml q6hrs   #Acute blood loss anemia due to hemoptysis vs possible GI bleed ~improving  - Trend CBC - SCD for VTE Prophylaxis - Transfuse for Hgb <7 - Continue PPI BID  #Meets SIRS criteria (HR >90, WBC 19.6) #Sepsis due to multifocal pneumonia - Trend WBC and monitor fever curve  - Follow cultures  - Completed course of azithromycin  & ceftriaxone /cefepime ~continue vancomycin  given + MRSA PCR and ceftriaxone   #Type II diabetes mellitus  - CBG's q4h; Target range of 140 to 180 - SSI - Follow ICU hypo/hyperglycemia protocol  Pt is critically ill with multiorgan failure. Prognosis is guarded, high risk for further decompensation, cardiac arrest,  and death.  Given current critical illness superimposed on multiple chronic co-morbidities and advanced age, overall long term prognosis is poor.  He DNR status.  Pt has had recent admission  for aspiration and with concern for aspiration this admission.   Palliative Care following to assist with GOC discussions. Following discussions with legal guardian [name] once deemed appropriate will perform 1-way extubation  Best Practice (right click and "Reselect all SmartList Selections" daily)   Diet/type: tubefeeds and NPO DVT prophylaxis: SCD's and subcutaneous heparin   GI prophylaxis: PPI Lines: Central line and yes and it is still needed Foley: N/A Code Status:  DNR Last date of multidisciplinary goals of care discussion [09/12/23]  05/1: Updated pts Legal guardian with DSS [name] via telephone to confirm goals of care.  Confirmed once able to extubate pt it will be a 1-way extubation and if he fails will transition to Comfort Measures Only  Labs   CBC: Recent Labs  Lab 09/08/23 0218 09/09/23 0309 09/10/23 0540 09/10/23 1400 09/11/23 0307 09/12/23 0508  WBC 19.2* 19.6* 16.8*  --  13.2* 9.1  NEUTROABS  --   --   --   --   --  5.8  HGB 9.0* 8.1* 6.9* 8.1* 8.4* 8.6*  HCT 28.4* 26.0* 22.1* 24.5* 25.1* 26.3*  MCV 101.4* 103.2* 103.3*  --  96.2 100.0  PLT 157 161 144*  --  155 171    Basic Metabolic Panel: Recent Labs  Lab 09/08/23 0218 09/09/23 0309 09/10/23 0540 09/11/23 0307 09/12/23 0508  NA 145 145 140 148* 149*  K 3.7 4.8 5.0 4.6 4.0  CL 106 104 104 108 109  CO2 32 32 33* 32 33*  GLUCOSE 159* 267* 235* 172* 171*  BUN 63* 63* 69* 84* 74*  CREATININE 1.29* 1.30* 1.19 0.84 0.83  CALCIUM  8.7* 9.0 8.6* 8.7* 8.3*  MG 2.5* 2.7* 2.7* 2.7* 2.4  PHOS 3.2 3.4 2.4* 2.2* 3.4   GFR: Estimated Creatinine Clearance: 82.4 mL/min (by C-G formula based on SCr of 0.83 mg/dL). Recent Labs  Lab 09/08/23 0218 09/09/23 0309 09/10/23 0540 09/11/23 0307 09/12/23 0508   PROCALCITON 1.16  --   --   --   --   WBC 19.2* 19.6* 16.8* 13.2* 9.1    Liver Function Tests: Recent Labs  Lab 09/06/23 0544 09/07/23 0341 09/08/23 0218 09/09/23 0309 09/10/23 0540  ALBUMIN  1.9* 1.9* 1.8* 1.6* 2.2*   No results for input(s): "LIPASE", "AMYLASE" in the last 168 hours. No results for input(s): "AMMONIA" in the last 168 hours.  ABG    Component Value Date/Time   PHART 7.39 09/10/2023 1021   PCO2ART 59 (H) 09/10/2023 1021   PO2ART 84 09/10/2023 1021   HCO3 35.7 (H) 09/10/2023 1021   ACIDBASEDEF 0.7 08/31/2023 1709   O2SAT 99.1 09/10/2023 1021     Coagulation Profile: No results for input(s): "INR", "PROTIME" in the last 168 hours.  Cardiac Enzymes: No results for input(s): "CKTOTAL", "CKMB", "CKMBINDEX", "TROPONINI" in the last 168 hours.  HbA1C: Hgb A1c MFr Bld  Date/Time Value Ref Range Status  09/09/2023 03:09 AM 5.3 4.8 - 5.6 % Final    Comment:    (NOTE)         Prediabetes: 5.7 - 6.4         Diabetes: >6.4         Glycemic control for adults with diabetes: <7.0     CBG: Recent Labs  Lab 09/11/23 1532 09/11/23 1937 09/12/23 0001 09/12/23 0326 09/12/23 0728  GLUCAP 149* 156* 161* 164* 142*    Review of Systems:   Unable to assess due to AMS/intubation/sedation/critical illness   Past Medical History:  He,  has  a past medical history of CHF (congestive heart failure) (HCC), Chronic kidney disease, COPD (chronic obstructive pulmonary disease) (HCC), and Dementia (HCC).   Surgical History:  History reviewed. No pertinent surgical history.   Social History:   reports that he has been smoking cigarettes. He has never used smokeless tobacco. He reports that he does not drink alcohol and does not use drugs.   Family History:  His Family history is unknown by patient.   Allergies Allergies  Allergen Reactions   Penicillins Rash and Swelling    .Has patient had a PCN reaction causing immediate rash, facial/tongue/throat swelling,  SOB or lightheadedness with hypotension: Unknown Has patient had a PCN reaction causing severe rash involving mucus membranes or skin necrosis: Unknown Has patient had a PCN reaction that required hospitalization: Unknown Has patient had a PCN reaction occurring within the last 10 years: Unknown If all of the above answers are "NO", then may proceed with Cephalosporin use.    Sulfa Antibiotics Rash and Swelling     Home Medications  Prior to Admission medications   Medication Sig Start Date End Date Taking? Authorizing Provider  albuterol  (PROVENTIL ) (2.5 MG/3ML) 0.083% nebulizer solution Take 2.5 mg by nebulization QID.   Yes [provider]  albuterol  (VENTOLIN  HFA) 108 (90 Base) MCG/ACT inhaler Inhale 2 puffs into the lungs every 6 (six) hours as needed for wheezing or shortness of breath.   Yes [provider]  aspirin  EC 81 MG tablet Take 81 mg by mouth in the morning. Swallow whole.   Yes [provider]  divalproex  (DEPAKOTE  ER) 500 MG 24 hr tablet 1 tablet (500 mg) every morning and 2 tablets (1000 mg) at bedtime 08/19/23  Yes Alexander, Natalie, DO  escitalopram  (LEXAPRO ) 5 MG tablet Take 5 mg by mouth at bedtime. 06/05/21  Yes [provider]  ferrous sulfate ER (IRON SLOW RELEASE) 142 (45 Fe) MG TBCR tablet Take 45 mg by mouth in the morning.   Yes [provider]  folic acid  (FOLVITE ) 1 MG tablet Take 1 mg by mouth in the morning.   Yes [provider]  gabapentin  (NEURONTIN ) 100 MG capsule Take 100 mg by mouth 3 (three) times daily. 06/05/21  Yes [provider]  JARDIANCE  10 MG TABS tablet Take 10 mg by mouth in the morning.   Yes [provider]  Multiple Vitamin (MULTIVITAMIN WITH MINERALS) TABS tablet Take 1 tablet by mouth daily. 08/19/23  Yes Alexander, Natalie, DO  naloxone [facility]) nasal spray 4 mg/0.1 mL Place 1 spray into the nose once.   Yes [provider]  ondansetron  (ZOFRAN ) 4 MG tablet Take  1 tablet (4 mg total) by mouth every 6 (six) hours as needed for nausea. 08/19/23  Yes Alexander, Natalie, DO  polyethylene glycol (MIRALAX  / GLYCOLAX ) 17 g packet Take 17 g by mouth daily as needed for mild constipation. 08/19/23  Yes Alexander, Natalie, DO  PRALUENT 150 MG/ML SOAJ Inject 150 mg into the skin as directed. Every 2 weeks in the morning   Yes [provider]  pyridOXINE  (VITAMIN B-6) 100 MG tablet Take 100 mg by mouth at bedtime.   Yes [provider]  QUEtiapine  (SEROQUEL ) 100 MG tablet Take 150 mg by mouth at bedtime. 06/05/21  Yes [provider]  QUEtiapine  (SEROQUEL ) 50 MG tablet Take 50 mg by mouth 2 (two) times daily. 50 mg in the morning (2) 50 mg at 2 pm 06/05/21  Yes [provider]  rosuvastatin  (CRESTOR )  20 MG tablet Take 20 mg by mouth at bedtime. 06/05/21  Yes [provider]  thiamine  100 MG tablet Take 100 mg by mouth in the morning.   Yes [provider]  feeding supplement (ENSURE ENLIVE / ENSURE PLUS) LIQD Take 237 mLs by mouth 3 (three) times daily between meals. 08/19/23   Alexander, Natalie, DO     Critical care time: 40 minutes     Janey Meek, [facility]  Pulmonary/Critical Care Pager [phone] (please enter 7 digits) PCCM Consult Pager [phone] (please enter 7 digits)

## 2023-09-12 NOTE — Progress Notes (Addendum)
 Daily Progress Note   Patient Name: Paul Vaughan       Date: 09/12/2023 DOB: 1955/08/15  Age: 68 y.o. MRN#: [id number] Attending Physician: [name], MD Primary Care Physician: Meri Stammer, NP Admit Date: 08/31/2023  Reason for Consultation/Follow-up: Establishing goals of care  Subjective: Notes and labs reviewed. Spoke with CCM. CCM discusses that they have spoken with DSS and plans remain in place for medical optimization and one way extubation. If patient fails extubation, will shift to comfort care. No  PEG.   Given aspiration PNA, if patient tolerates extubation, he will need a swallow evaluation by SLP. Will need to determine if there is a safe diet for him, and if he is amenable to that diet. If there is not a safe diet or if he is not amenable, will need to engage hospice services at D/C.   Length of Stay: 12  Current Medications: Scheduled Meds:   acetylcysteine   4 mL Nebulization BID   Chlorhexidine  Gluconate Cloth  6 each Topical Q1400   docusate  100 mg Per Tube BID   escitalopram   5 mg Per Tube QHS   free water   200 mL Per Tube Q6H   gabapentin   100 mg Per Tube Q8H   heparin  injection (subcutaneous)  5,000 Units Subcutaneous Q8H   hydrocortisone  sod succinate (SOLU-CORTEF ) inj  50 mg Intravenous Q6H   insulin  aspart  0-20 Units Subcutaneous Q4H   insulin  aspart  5 Units Subcutaneous Q4H   insulin  glargine-yfgn  15 Units Subcutaneous BID   ipratropium-albuterol   3 mL Nebulization TID   mupirocin  ointment  1 Application Nasal BID   nystatin   5 mL Oral QID   mouth rinse  15 mL Mouth Rinse Q2H   pantoprazole  (PROTONIX ) IV  40 mg Intravenous Q12H   polyethylene glycol  17 g Per Tube Daily   QUEtiapine   150 mg Per Tube QHS   QUEtiapine   50 mg Per Tube 2  times per day   thiamine   100 mg Per Tube Daily   valproic  acid  500 mg Per Tube TID    Continuous Infusions:  cefTRIAXone  (ROCEPHIN )  IV 2 g (09/12/23 1238)   feeding supplement (VITAL AF 1.2 CAL) 65 mL/hr at 09/12/23 1059   fentaNYL  infusion INTRAVENOUS 150 mcg/hr (09/12/23 1059)   propofol  (DIPRIVAN ) infusion  Stopped (09/12/23 4742)   vancomycin  Stopped (09/12/23 1006)    PRN Meds: bisacodyl , fentaNYL , liver oil-zinc  oxide, mouth rinse  Physical Exam Constitutional:      Comments: Patient opens eyes when calling his name.   Pulmonary:     Comments: On ventilator.  Neurological:     Mental Status: He is alert.             Vital Signs: BP (!) 168/85   Pulse (!) 111   Temp 98.3 F (36.8 C) (Axillary)   Resp 15   Ht 5' 7.99" (1.727 m)   Wt 79.9 kg   SpO2 94%   BMI 26.79 kg/m  SpO2: SpO2: 94 % O2 Device: O2 Device: Ventilator O2 Flow Rate: O2 Flow Rate (L/min): 50 L/min  Intake/output summary:  Intake/Output Summary (Last 24 hours) at 09/12/2023 1405 Last data filed at 09/12/2023 1059 Gross per 24 hour  Intake 2438.93 ml  Output 1650 ml  Net 788.93 ml   LBM: Last BM Date : 09/11/23 Baseline Weight: Weight: 77 kg Most recent weight: Weight: 79.9 kg     Patient Active Problem List   Diagnosis Date Noted   Malnutrition of moderate degree 09/07/2023   Acute pulmonary embolism (HCC) 09/01/2023   Severe sepsis with acute organ dysfunction (HCC) 08/31/2023   Acute on chronic hypoxic respiratory failure (HCC) 08/31/2023   Multifocal pneumonia 07/29/2023   Chronic diastolic CHF (congestive heart failure) (HCC) 07/29/2023   Rhabdomyolysis 07/29/2023   Acute hypoxic respiratory failure (HCC) 07/28/2023   Elevated LFTs 07/28/2023   Thrombocytopenia (HCC) 07/01/2021   Anemia of chronic disease 07/01/2021   AKI (acute kidney injury) (HCC) 07/01/2021   Severe sepsis (HCC) 06/30/2021   Acute hypoxemic respiratory failure due to COVID-19 (HCC) 06/30/2021   Chronic  depression 06/08/2021   Chronic obstructive pulmonary disease, unspecified (HCC) 06/08/2021   Essential (primary) hypertension 06/08/2021   Dementia with behavioral disturbance (HCC) 06/08/2021   Proteinuria, unspecified 06/08/2021   Hyperlipidemia, unspecified 06/08/2021   Borderline intellectual disability 09/08/2015   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    Palliative Care Assessment & Plan     Recommendations/Plan: DSS is working closely with CCM.  Plans in place for medical optimization and one way extubation. If patient fails extubation, will shift to comfort care. No trach and no PEG.   Given aspiration PNA, if patient tolerates extubation, he will need a swallow evaluation by SLP. Will need to determine if there is a safe diet for him, and if he is amenable to that diet. If there is not a safe diet or if he is not amenable, will need to engage hospice services at D/C.    Code Status:    Code Status Orders  (From admission, onward)           Start     Ordered   09/12/23 1024  Do not attempt resuscitation (DNR)- Limited -Do Not Intubate (DNI)  (Code Status)  Continuous       Question Answer Comment  If pulseless and not breathing No CPR or chest compressions.   In Pre-Arrest Conditions (Patient Is Breathing and Has A Pulse) Do not intubate. Provide all appropriate non-invasive medical interventions. Avoid ICU transfer unless indicated or required.   Consent: Discussion documented in EHR or advanced directives reviewed      09/12/23 1024           Code Status History     Date Active Date Inactive Code  Status Order ID Comments User Context   09/09/2023 1509 09/12/2023 1024 Do not attempt resuscitation (DNR) PRE-ARREST INTERVENTIONS DESIRED [id number]  Meribeth Standard, NP Inpatient   08/31/2023 1741 09/09/2023 1509 Full Code [id number]  Cedric Cohn, DO Inpatient   08/31/2023 1421 08/31/2023 1740 Full Code [id number]  Cedric Cohn, DO  ED   07/28/2023 1353 08/19/2023 1706 Full Code [id number]  Avi Body, MD ED   06/30/2021 1239 07/06/2021 1904 Full Code [id number]  Read Camel, MD ED   11/20/2019 0652 12/14/2019 2059 Full Code [id number]  Isa Manuel, MD ED       Prognosis:  Poor overall    Care plan was discussed with CCM  Thank you for allowing the Palliative Medicine Team to assist in the care of this patient.    Meribeth Standard, NP  Please contact Palliative Medicine Team phone at [phone] for questions and concerns.

## 2023-09-12 NOTE — Progress Notes (Signed)
 PHARMACY CONSULT NOTE  Pharmacy Consult for Electrolyte Monitoring and Replacement   Recent Labs: Potassium (mmol/L)  Date Value  09/12/2023 4.0   Magnesium (mg/dL)  Date Value  86/57/8469 2.4   Calcium  (mg/dL)  Date Value  62/95/2841 8.3 (L)   Albumin  (g/dL)  Date Value  32/44/0102 2.2 (L)   Phosphorus (mg/dL)  Date Value  72/53/6644 3.4   Sodium (mmol/L)  Date Value  09/12/2023 149 (H)   Assessment:  68 y.o. male with medical history significant of dementia, CKD stage IIIa, COPD, CHF, who presented to emergency department for chief concerns of respiratory distress. Pharmacy is asked to follow and replace electrolytes while in CCU  Goal of Therapy:  Electrolytes WNL  Plan:  --Na 149, defer management to PCCM. Free water  flushes were increased 09/11/23 --Re-check electrolytes in AM  Page Boast 09/12/2023 7:50 AM

## 2023-09-12 DEATH — deceased

## 2023-09-13 ENCOUNTER — Inpatient Hospital Stay

## 2023-09-13 DIAGNOSIS — J9621 Acute and chronic respiratory failure with hypoxia: Secondary | ICD-10-CM | POA: Diagnosis not present

## 2023-09-13 DIAGNOSIS — A419 Sepsis, unspecified organism: Secondary | ICD-10-CM | POA: Diagnosis not present

## 2023-09-13 DIAGNOSIS — R6520 Severe sepsis without septic shock: Secondary | ICD-10-CM | POA: Diagnosis not present

## 2023-09-13 DIAGNOSIS — J189 Pneumonia, unspecified organism: Secondary | ICD-10-CM | POA: Diagnosis not present

## 2023-09-13 DIAGNOSIS — Z515 Encounter for palliative care: Secondary | ICD-10-CM | POA: Diagnosis not present

## 2023-09-13 LAB — CBC WITH DIFFERENTIAL/PLATELET
Abs Immature Granulocytes: 0.99 10*3/uL — ABNORMAL HIGH (ref 0.00–0.07)
Basophils Absolute: 0 10*3/uL (ref 0.0–0.1)
Basophils Relative: 0 %
Eosinophils Absolute: 0 10*3/uL (ref 0.0–0.5)
Eosinophils Relative: 0 %
HCT: 28.9 % — ABNORMAL LOW (ref 39.0–52.0)
Hemoglobin: 9.6 g/dL — ABNORMAL LOW (ref 13.0–17.0)
Immature Granulocytes: 9 %
Lymphocytes Relative: 16 %
Lymphs Abs: 1.8 10*3/uL (ref 0.7–4.0)
MCH: 31.9 pg (ref 26.0–34.0)
MCHC: 33.2 g/dL (ref 30.0–36.0)
MCV: 96 fL (ref 80.0–100.0)
Monocytes Absolute: 0.8 10*3/uL (ref 0.1–1.0)
Monocytes Relative: 7 %
Neutro Abs: 7.9 10*3/uL — ABNORMAL HIGH (ref 1.7–7.7)
Neutrophils Relative %: 68 %
Platelets: 236 10*3/uL (ref 150–400)
RBC: 3.01 MIL/uL — ABNORMAL LOW (ref 4.22–5.81)
RDW: 15.9 % — ABNORMAL HIGH (ref 11.5–15.5)
Smear Review: NORMAL
WBC: 11.5 10*3/uL — ABNORMAL HIGH (ref 4.0–10.5)
nRBC: 0.8 % — ABNORMAL HIGH (ref 0.0–0.2)

## 2023-09-13 LAB — BASIC METABOLIC PANEL WITH GFR
Anion gap: 6 (ref 5–15)
BUN: 69 mg/dL — ABNORMAL HIGH (ref 8–23)
CO2: 33 mmol/L — ABNORMAL HIGH (ref 22–32)
Calcium: 8.5 mg/dL — ABNORMAL LOW (ref 8.9–10.3)
Chloride: 107 mmol/L (ref 98–111)
Creatinine, Ser: 0.77 mg/dL (ref 0.61–1.24)
GFR, Estimated: >60 mL/min (ref >60)
Glucose, Bld: 150 mg/dL — ABNORMAL HIGH (ref 70–99)
Potassium: 3.6 mmol/L (ref 3.5–5.1)
Sodium: 146 mmol/L — ABNORMAL HIGH (ref 135–145)

## 2023-09-13 LAB — GLUCOSE, CAPILLARY
Glucose-Capillary: 135 mg/dL — ABNORMAL HIGH (ref 70–99)
Glucose-Capillary: 139 mg/dL — ABNORMAL HIGH (ref 70–99)
Glucose-Capillary: 143 mg/dL — ABNORMAL HIGH (ref 70–99)
Glucose-Capillary: 154 mg/dL — ABNORMAL HIGH (ref 70–99)
Glucose-Capillary: 166 mg/dL — ABNORMAL HIGH (ref 70–99)
Glucose-Capillary: 224 mg/dL — ABNORMAL HIGH (ref 70–99)

## 2023-09-13 LAB — PHOSPHORUS: Phosphorus: 3.4 mg/dL (ref 2.5–4.6)

## 2023-09-13 LAB — AMMONIA: Ammonia: 28 umol/L (ref 9–35)

## 2023-09-13 LAB — VALPROIC ACID LEVEL: Valproic Acid Lvl: 50 ug/mL (ref 50–100)

## 2023-09-13 LAB — MAGNESIUM: Magnesium: 2.3 mg/dL (ref 1.7–2.4)

## 2023-09-13 LAB — HEPARIN LEVEL (UNFRACTIONATED): Heparin Unfractionated: 0.22 [IU]/mL — ABNORMAL LOW (ref 0.30–0.70)

## 2023-09-13 MED ORDER — VITAL 1.5 CAL PO LIQD
1000.0000 mL | ORAL | Status: DC
  Administered 2023-09-13 – 2023-09-16 (×4): 1000 mL

## 2023-09-13 MED ORDER — FUROSEMIDE 10 MG/ML IJ SOLN
40.0000 mg | Freq: Once | INTRAMUSCULAR | Status: AC
  Administered 2023-09-13: 40 mg via INTRAVENOUS
  Filled 2023-09-13: qty 4

## 2023-09-13 MED ORDER — NYSTATIN 100000 UNIT/ML MT SUSP
5.0000 mL | Freq: Four times a day (QID) | OROMUCOSAL | Status: DC
  Administered 2023-09-13 – 2023-09-16 (×12): 500000 [IU] via ORAL
  Filled 2023-09-13 (×15): qty 5

## 2023-09-13 MED ORDER — HEPARIN (PORCINE) 25000 UT/250ML-% IV SOLN
1200.0000 [IU]/h | INTRAVENOUS | Status: DC
  Administered 2023-09-13: 800 [IU]/h via INTRAVENOUS
  Administered 2023-09-14: 1050 [IU]/h via INTRAVENOUS
  Administered 2023-09-15 – 2023-09-16 (×2): 1100 [IU]/h via INTRAVENOUS
  Administered 2023-09-17 – 2023-09-18 (×2): 1200 [IU]/h via INTRAVENOUS
  Filled 2023-09-13 (×6): qty 250

## 2023-09-13 MED ORDER — FUROSEMIDE 10 MG/ML IJ SOLN
40.0000 mg | Freq: Every day | INTRAMUSCULAR | Status: DC
  Administered 2023-09-13 – 2023-09-15 (×3): 40 mg via INTRAVENOUS
  Filled 2023-09-13 (×4): qty 4

## 2023-09-13 MED ORDER — GLYCOPYRROLATE 0.2 MG/ML IJ SOLN
0.2000 mg | Freq: Three times a day (TID) | INTRAMUSCULAR | Status: DC | PRN
  Administered 2023-09-13 – 2023-09-23 (×5): 0.2 mg via INTRAVENOUS
  Filled 2023-09-13 (×8): qty 1

## 2023-09-13 MED ORDER — NYSTATIN 100000 UNIT/ML MT SUSP
5.0000 mL | Freq: Four times a day (QID) | OROMUCOSAL | Status: DC
  Filled 2023-09-13: qty 5

## 2023-09-13 MED ORDER — PROSOURCE TF20 ENFIT COMPATIBL EN LIQD
60.0000 mL | Freq: Every day | ENTERAL | Status: DC
  Administered 2023-09-14 – 2023-09-17 (×4): 60 mL

## 2023-09-13 MED ORDER — METOCLOPRAMIDE HCL 5 MG/ML IJ SOLN
INTRAMUSCULAR | Status: AC
  Filled 2023-09-13: qty 2

## 2023-09-13 NOTE — TOC Progression Note (Signed)
 Transition of Care [facility]) - Progression Note    Patient Details  Name: Paul Vaughan MRN: [id number] Date of Birth: [birth year]  [facility] CM/SW Contact  Arminda Landmark, RN Phone Number: 09/13/2023, 4:23 PM  Clinical Narrative:    Pt extubated on 5/1 and on high flow [place]. Breathing and bleeding risk being closely monitored. May need a palliative care consult. Has new peg tube and TF's to start today. TOC to continue to follow.    Expected Discharge Plan: Skilled Nursing Facility Barriers to Discharge: Continued Medical Work up  Expected Discharge Plan and Services   Discharge Planning Services: CM Consult Post Acute Care Choice: Skilled Nursing Facility Living arrangements for the past 2 months: Skilled Nursing Facility                                       Social Determinants of Health (SDOH) Interventions SDOH Screenings   Food Insecurity: Patient Unable To Answer (08/31/2023)  Housing: Patient Unable To Answer (09/01/2023)  Transportation Needs: Patient Unable To Answer (08/31/2023)  Utilities: Patient Unable To Answer (08/31/2023)  Social Connections: Patient Unable To Answer (08/31/2023)  Tobacco Use: High Risk (08/31/2023)    Readmission Risk Interventions    07/01/2021    1:02 PM  Readmission Risk Prevention Plan  Transportation Screening Complete  PCP or Specialist Appt within 5-7 Days Complete  Home Care Screening Complete  Medication Review (RN CM) Complete

## 2023-09-13 NOTE — Progress Notes (Signed)
 PROGRESS NOTE    Paul Vaughan  ZOX:[id number] DOB: 11/06/1955 DOA: 08/31/2023 PCP: Meri Stammer, NP    Brief Narrative:  Paul Vaughan is a 68 year old male with history of non-insulin -dependent diabetes mellitus, hyperlipidemia, neuropathy, intellectual learning difficulty/dementia, COPD, on baseline 4 L nasal cannula, heart failure preserved ejection fraction, who presents emergency department for chief concerns of respiratory distress noticed by nursing staff.    Of note, he was recently admitted from 3/16 till 08/19/2023 when presented with generalized weakness, treated for pneumonia and rhabdo. Patient did had worsening respiratory status during that admission which was stabilizes and he was discharged on 4 L of oxygen to SNF.    5/2: Transferred to [facility] service.  Patient was successfully extubated to heated high flow nasal cannula on 5/1.   Assessment & Plan:   Principal Problem:   Severe sepsis with acute organ dysfunction (HCC) Active Problems:   Multifocal pneumonia   Acute on chronic hypoxic respiratory failure (HCC)   Acute pulmonary embolism (HCC)   AKI (acute kidney injury) (HCC)   Elevated LFTs   Essential (primary) hypertension   Chronic diastolic CHF (congestive heart failure) (HCC)   Adjustment disorder with mixed disturbance of emotions and conduct   Chronic depression   Hyperlipidemia, unspecified   Borderline intellectual disability   Malnutrition of moderate degree  #Acute Hypoxic Respiratory Failure  #Multifocal Pneumonia~Aspiration #Pulmonary Embolism #Pulmonary Edema & Pleural effusion #Mucus plugging Hx: COPD on baseline 4L [place] CTA Chest on 4/20: revealed PE in the right superior pulmonary artery, along with patchy airspace opacities bilaterally with consolidate changes in the LUL consistent with multifocal pneumonia Patient successfully extubated to heated high flow nasal cannula on 5/1.  Regular sedation thus far.  Per palliative care if  patient decompensates will transition to full comfort measures Plan: Continue IV antibiotic Continue stress dose hydrocortisone  for now Cautiously reattempt heparin  GTT for treatment of pulmonary embolism If patient bleeds may contact GI Bronchodilators Wean oxygen as tolerated  #Hypotension: septic shock +/- sedation related #Septic shock #Acute decompensated HFmrEF Echocardiogram 09/02/23: LVEF 45-50%, mild LVH, indeterminate diastolic parameters, RV not well visualized Shock physiology has resolved.  Blood pressure has improved.  Lactic acid normalized.  Troponin negative x 2. Plan: Lasix  40 mg IV daily Daily renal parameters  #Acute blood loss anemia due to hemoptysis vs possible GI bleed ~improving  Hemoglobin stable but patient has had 2 episodes of bleeding after initiation of heparin  gtt. Plan: Restart hep gtt Trend CBC BID IV PPI Transfuse <7  #Type II diabetes mellitus  - CBG's q4h; Target range of 140 to 180 - SSI  #AKI~resolved  #Hypernatremia  #Hypophosphatemia~resolved   - Trend BMP  - Replace electrolytes as indicated ~ Pharmacy following for assistance with electrolyte replacement - Strict I&O's - Avoid nephrotoxic agents - Increased free water  flushes to 200 ml q6hrs     DVT prophylaxis: hep gtt Code Status: DNR Family Communication: None.  Attempted to contact DSS SW at [phone].  No answer, VM left Disposition Plan: Status is: Inpatient Remains inpatient appropriate because: Multiple acute issues as above   Level of care: Stepdown  Consultants:  Palliative care  Procedures:  Bronchoscopy x 2  Antimicrobials: Cefepime  Vancomycin     Subjective: Seen and examined.  Not in visible distress.  Unable to participate in interview  Objective: Vitals:   09/13/23 0500 09/13/23 0600 09/13/23 0700 09/13/23 0800  BP: (!) 166/88 (!) 159/83 (!) 150/75 (!) 145/81  Pulse: (!) 110 (!) 102 (!) 101  90  Resp: 19 20 17 18   Temp:   99.1 F (37.3  C)   TempSrc:      SpO2: 97% 96% 95% 94%  Weight:      Height:        Intake/Output Summary (Last 24 hours) at 09/13/2023 1151 Last data filed at 09/13/2023 8119 Gross per 24 hour  Intake 1293.78 ml  Output 1250 ml  Net 43.78 ml   Filed Weights   09/11/23 0314 09/12/23 0500 09/13/23 0408  Weight: 77.6 kg 79.9 kg 79.9 kg    Examination:  General exam: Chronically ill appearing Respiratory system: Coarse breath sounds bl.  Normal WOB.  40% @ 50% HHFNC Cardiovascular system: S1-S2, RRR, no murmurs, no pedal edema Gastrointestinal system: Soft, NT/ND, normal bowel sounds Central nervous system: Awake.  Unable to assess orientation Extremities: Marked decreased power Skin: No rashes, lesions or ulcers Psychiatry: Unable to assess    Data Reviewed: I have personally reviewed following labs and imaging studies  CBC: Recent Labs  Lab 09/09/23 0309 09/10/23 0540 09/10/23 1400 09/11/23 0307 09/12/23 0508 09/13/23 0234  WBC 19.6* 16.8*  --  13.2* 9.1 11.5*  NEUTROABS  --   --   --   --  5.8 7.9*  HGB 8.1* 6.9* 8.1* 8.4* 8.6* 9.6*  HCT 26.0* 22.1* 24.5* 25.1* 26.3* 28.9*  MCV 103.2* 103.3*  --  96.2 100.0 96.0  PLT 161 144*  --  155 171 236   Basic Metabolic Panel: Recent Labs  Lab 09/09/23 0309 09/10/23 0540 09/11/23 0307 09/12/23 0508 09/13/23 0234  NA 145 140 148* 149* 146*  K 4.8 5.0 4.6 4.0 3.6  CL 104 104 108 109 107  CO2 32 33* 32 33* 33*  GLUCOSE 267* 235* 172* 171* 150*  BUN 63* 69* 84* 74* 69*  CREATININE 1.30* 1.19 0.84 0.83 0.77  CALCIUM  9.0 8.6* 8.7* 8.3* 8.5*  MG 2.7* 2.7* 2.7* 2.4 2.3  PHOS 3.4 2.4* 2.2* 3.4 3.4   GFR: Estimated Creatinine Clearance: 85.5 mL/min (by C-G formula based on SCr of 0.77 mg/dL). Liver Function Tests: Recent Labs  Lab 09/07/23 0341 09/08/23 0218 09/09/23 0309 09/10/23 0540  ALBUMIN  1.9* 1.8* 1.6* 2.2*   No results for input(s): "LIPASE", "AMYLASE" in the last 168 hours. Recent Labs  Lab 09/13/23 0234   AMMONIA 28   Coagulation Profile: No results for input(s): "INR", "PROTIME" in the last 168 hours. Cardiac Enzymes: No results for input(s): "CKTOTAL", "CKMB", "CKMBINDEX", "TROPONINI" in the last 168 hours. BNP (last 3 results) No results for input(s): "PROBNP" in the last 8760 hours. HbA1C: No results for input(s): "HGBA1C" in the last 72 hours. CBG: Recent Labs  Lab 09/12/23 1922 09/12/23 2326 09/13/23 0455 09/13/23 0754 09/13/23 1121  GLUCAP 125* 179* 143* 154* 166*   Lipid Profile: No results for input(s): "CHOL", "HDL", "LDLCALC", "TRIG", "CHOLHDL", "LDLDIRECT" in the last 72 hours. Thyroid Function Tests: No results for input(s): "TSH", "T4TOTAL", "FREET4", "T3FREE", "THYROIDAB" in the last 72 hours. Anemia Panel: No results for input(s): "VITAMINB12", "FOLATE", "FERRITIN", "TIBC", "IRON", "RETICCTPCT" in the last 72 hours. Sepsis Labs: Recent Labs  Lab 09/08/23 0218  PROCALCITON 1.16    Recent Results (from the past 240 hours)  Culture, Respiratory w Gram Stain     Status: None   Collection Time: 09/04/23  8:11 PM   Specimen: Tracheal Aspirate; Respiratory  Result Value Ref Range Status   Specimen Description   Final    TRACHEAL ASPIRATE Performed at [facility], [address] [place], [place] [address]    Special Requests   Final    NONE Performed at [facility], [address] [place], [place] [address]    Gram Stain   Final    FEW WBC PRESENT,BOTH PMN AND MONONUCLEAR RARE GRAM POSITIVE COCCI IN CLUSTERS IN PAIRS    Culture   Final    RARE Normal respiratory flora-no Staph aureus or Pseudomonas seen Performed at [facility], [address] [place], [place] [address]    Report Status 09/07/2023 FINAL  Final  Culture, BAL-quantitative w Gram Stain     Status: Abnormal   Collection Time: 09/05/23  4:04 PM   Specimen: Bronchoalveolar Lavage; Respiratory  Result Value Ref Range Status   Specimen Description   Final     BRONCHIAL ALVEOLAR LAVAGE Performed at [facility], [address], [place], [place] [address]    Special Requests   Final    NONE Performed at [facility], [address] [place], [place] [address]    Gram Stain   Final    ABUNDANT WBC PRESENT, PREDOMINANTLY PMN NO ORGANISMS SEEN    Culture (A)  Final    30,000 COLONIES/mL Normal respiratory flora-no Staph aureus or Pseudomonas seen Performed at [facility], [address], [place], [place] [address]    Report Status 09/07/2023 FINAL  Final  Culture, BAL-quantitative w Gram Stain     Status: Abnormal   Collection Time: 09/07/23 12:37 PM   Specimen: Bronchoalveolar Lavage; Respiratory  Result Value Ref Range Status   Specimen Description   Final    BRONCHIAL ALVEOLAR LAVAGE Performed at [facility], [address] [place], [place] [address]    Special Requests   Final    NONE Performed at [facility], [address] [place], [place] [address]    Gram Stain   Final    FEW WBC PRESENT, PREDOMINANTLY PMN NO ORGANISMS SEEN    Culture (A)  Final    20,000 COLONIES/mL Consistent with normal respiratory flora. No Pseudomonas species isolated Performed at [facility], [address] [place], [place] [address]    Report Status 09/10/2023 FINAL  Final  MRSA Next Gen by PCR, Nasal     Status: Abnormal   Collection Time: 09/09/23 10:52 AM   Specimen: Nasal Mucosa; Nasal Swab  Result Value Ref Range Status   MRSA by PCR Next Gen DETECTED (A) NOT DETECTED Final    Comment: RESULT CALLED TO, READ BACK BY AND VERIFIED WITH: JUAN RODRIGUEZ 1215 09/09/23 MU (NOTE) The GeneXpert MRSA Assay (FDA approved for NASAL specimens only), is one component of a comprehensive MRSA colonization surveillance program. It is not intended to diagnose MRSA infection nor to guide or monitor treatment for MRSA infections. Test performance is not FDA approved in patients less than 29  years old. Performed at [facility], [address] [place], [place] [address]          Radiology Studies: DG Abd 1 View Result Date: 09/12/2023 CLINICAL DATA:  [id number] Nasogastric tube present [id number] EXAM: ABDOMEN - 1 VIEW COMPARISON:  Radiographs 09/08/2023 and 09/04/2023.  CT 07/28/2023. FINDINGS: 1221 hours. Enteric tube has been removed. There is a new weighted feeding tube with its tip overlying the right L2 transverse process, likely in the distal stomach or proximal duodenum. The visualized bowel gas pattern is nonobstructive. IMPRESSION: Weighted feeding tube tip likely in the distal stomach or  proximal duodenum. Electronically Signed   By: Elmon Hagedorn M.D.   On: 09/12/2023 14:04        Scheduled Meds:  Chlorhexidine  Gluconate Cloth  6 each Topical Q1400   docusate  100 mg Per Tube BID   escitalopram   5 mg Per Tube QHS   free water   200 mL Per Tube Q6H   gabapentin   100 mg Per Tube Q8H   heparin  injection (subcutaneous)  5,000 Units Subcutaneous Q8H   hydrocortisone  sod succinate (SOLU-CORTEF ) inj  50 mg Intravenous Q6H   insulin  aspart  0-20 Units Subcutaneous Q4H   insulin  aspart  5 Units Subcutaneous Q4H   insulin  glargine-yfgn  15 Units Subcutaneous BID   ipratropium-albuterol   3 mL Nebulization TID   mupirocin  ointment  1 Application Nasal BID   nystatin   5 mL Oral QID   mouth rinse  15 mL Mouth Rinse 4 times per day   pantoprazole  (PROTONIX ) IV  40 mg Intravenous Q12H   polyethylene glycol  17 g Per Tube Daily   QUEtiapine   150 mg Per Tube QHS   QUEtiapine   50 mg Per Tube 2 times per day   thiamine   100 mg Per Tube Daily   valproic  acid  500 mg Per Tube TID   Continuous Infusions:  cefTRIAXone  (ROCEPHIN )  IV Stopped (09/12/23 1308)   feeding supplement (VITAL AF 1.2 CAL) 1,000 mL (09/13/23 0810)   vancomycin  1,000 mg (09/13/23 0915)     LOS: 13 days   CRITICAL CARE Performed by: Tiajuana Fluke   Total critical care time: 35  minutes  Critical care time was exclusive of separately billable procedures and treating other patients.  Critical care was necessary to treat or prevent imminent or life-threatening deterioration.  Critical care was time spent personally by me on the following activities: development of treatment plan with patient and/or surrogate as well as nursing, discussions with consultants, evaluation of patient's response to treatment, examination of patient, obtaining history from patient or surrogate, ordering and performing treatments and interventions, ordering and review of laboratory studies, ordering and review of radiographic studies, pulse oximetry and re-evaluation of patient's condition.     Tiajuana Fluke, MD [facility]   If 7PM-7AM, please contact night-coverage  09/13/2023, 11:51 AM

## 2023-09-13 NOTE — Progress Notes (Signed)
 Daily Progress Note   Patient Name: Paul Vaughan       Date: 09/13/2023 DOB: Nov 20, 1955  Age: 68 y.o. MRN#: [id number] Attending Physician: Tiajuana Fluke, MD Primary Care Physician: Meri Stammer, NP Admit Date: 08/31/2023  Reason for Consultation/Follow-up: Establishing goals of care  HPI/Brief Hospital Review: [name] is a 68 year old male with history of non-insulin -dependent diabetes mellitus, hyperlipidemia, neuropathy, intellectual learning difficulty/dementia, COPD, on baseline 4 L nasal cannula, heart failure preserved ejection fraction, who presents emergency department for chief concerns of respiratory distress noticed by nursing staff.    4/26 remains intubated and sedated Unable to tolerate heparin  due to bleeding, found to have PE, s/p bronch due to bilateral mucus plugging (4/24) Will require second bronchoscopy today  Successful extubation 5/1, Dobhoff in place for nutrition  SLP evaluation 5/2 remains high risk for aspiration, recommendations to remain Full Code at this time   Palliative Medicine consulted for assisting with goals of care conversations.  Subjective: Extensive chart review has been completed prior to meeting patient including labs, vital signs, imaging, progress notes, orders, and available advanced directive documents from current and previous encounters.    Visited with [name] at his bedside.  He is awake, unable to verbally communicate, able to follow simple commands intermittently.  Noted to have chest congestion, attempted to have [name] cough, unable to successfully clear congestion due to weekend cough.   Called and spoke with Kennieth Peaches, legal guardian with DSS.  Provided [name] with medical updates.  We  reviewed speech therapy's swallow evaluation from today.  Paul Vaughan remains high risk for aspiration due to increased secretions with the weekend cough and inability to clear her airway as well as altered mental status with recommendation to continue n.p.o. status at this time.  Answered questions around NG tube and continuous tube feeds remain in place to provide a source of nutrition while allowing time for outcomes and time for recovery in between repeating swallow evaluation.  [name] shares she wishes to continue current treatment plan at this time for [name] and will consider ongoing goals of care based on [name]'s response to treatment and recovery over the next few days.  Answered and addressed all questions and concerns.  PMT to continue to follow for ongoing needs and support.  Objective:  Physical Exam Constitutional:      General: He is not in acute distress.    Appearance: He is ill-appearing.  Pulmonary:     Effort: Pulmonary effort is normal.     Breath sounds: Rhonchi present.  Skin:    General: Skin is warm and dry.  Neurological:     Mental Status: He is alert.     Motor: Weakness present.             Vital Signs: BP (!) 142/81   Pulse 92   Temp 98.3 F (36.8 C)   Resp (!) 25   Ht 5' 7.99" (1.727 m)   Wt 79.9 kg   SpO2 96%   BMI 26.79 kg/m  SpO2: SpO2: 96 % O2 Device: O2 Device: Heated High Flow Nasal Cannula O2 Flow Rate: O2 Flow Rate (L/min): 40 L/min   Palliative Care Assessment & Plan   Assessment/Recommendation/Plan  Continue current plan of care allowing time for outcomes PMT to continue to follow for ongoing goals of care conversations with legal guardian through DSS  Care plan was discussed with primary team and nursing staff  Thank you for allowing the Palliative Medicine Team to assist in the care of this patient.  Total time:  35 minutes  Time spent includes: Detailed review of medical records (labs, imaging, vital signs),  medically appropriate exam (mental status, respiratory, cardiac, skin), discussed with treatment team, counseling and educating patient, family and staff, documenting clinical information, medication management and coordination of care.  [name], DNP, AGNP-C Palliative Medicine   Please contact Palliative Medicine Team phone at [phone] for questions and concerns.

## 2023-09-13 NOTE — Plan of Care (Signed)
 Patient lethargic throughout the shift. Patient is congested with weak cough. MD made aware. On HFNC, tolerating tube feeds, adequate urine output. SLP unable to evaluate patient, please review note. Heparin  drip started. Continue to assess.

## 2023-09-13 NOTE — Progress Notes (Signed)
 Nutrition Follow Up Note   DOCUMENTATION CODES:   Non-severe (moderate) malnutrition in context of chronic illness  INTERVENTION:   Change to Vital 1.5@60ml /hr continuous + ProSource TF 20- Give 60ml daily via tube  Free water  flushes 200ml q6 hours per MD  Regimen provides 2240kcal/day, 117g/day protein and 1925ml/day of free water .   Thiamine  100mg  daily via tube x 7 days   Daily weights   NUTRITION DIAGNOSIS:   Moderate Malnutrition related to chronic illness as evidenced by moderate fat depletion, moderate muscle depletion. -ongoing   GOAL:   Patient will meet greater than or equal to 90% of their needs -met   MONITOR:   Diet advancement, Labs, Weight trends, TF tolerance, I & O's, Skin  ASSESSMENT:   68 year old male with history of non-insulin -dependent diabetes mellitus, HTN, depression, CKD, hyperlipidemia, neuropathy, intellectual learning difficulty/dementia, COPD and CHF who is admitted with PNA, sepsis, AKI and PE.  Visited pt's room today. Pt lethargic and not able to answer questions. Pt s/p one-way extubation 5/1. NGT placed 5/1. Pt tolerating tube feeds at goal rate via NGT. Pt seen by SLP today and remains NPO. Refeed labs stable. Hypernatremia improved. Per chart, pt is up ~12lbs since admit and is up ~6lbs from his UBW. GOC discussions are ongoing.   Medications reviewed and include: heparin , solu-cortef , insulin , protonix , Kphos, miralax , ceftriaxone , propofol , vancomycin    Labs reviewed: Na 146(H), K 3.6 wnl, BUN 69(H), P 3.4 wnl, Mg 2.3 wnl Wbc- 11.5(H), Hgb 9.6(L), Hct 28.9(L) Cbgs- 166, 154, 143 x 24 hrs   UOP-   Nutrition Focused Physical Exam:  Flowsheet Row Most Recent Value  Orbital Region Mild depletion  Upper Arm Region Moderate depletion  Thoracic and Lumbar Region Moderate depletion  Buccal Region Mild depletion  Temple Region Moderate depletion  Clavicle Bone Region Moderate depletion  Clavicle and Acromion Bone Region  Moderate depletion  Scapular Bone Region Moderate depletion  Dorsal Hand Mild depletion  Patellar Region Moderate depletion  Anterior Thigh Region Moderate depletion  Posterior Calf Region Moderate depletion  Edema (RD Assessment) Mild  Hair Reviewed  Eyes Reviewed  Mouth Reviewed  Skin Reviewed  Nails Reviewed   Diet Order:   Diet Order             Diet NPO time specified  Diet effective now                  EDUCATION NEEDS:   No education needs have been identified at this time  Skin:  Skin Assessment: Reviewed RN Assessment (ecchymosis)  Last BM:  5/2- TYPE 6  Height:   Ht Readings from Last 1 Encounters:  09/09/23 5' 7.99" (1.727 m)    Weight:   Wt Readings from Last 1 Encounters:  09/13/23 79.9 kg    Ideal Body Weight:  70 kg  BMI:  Body mass index is 26.79 kg/m.  Estimated Nutritional Needs:   Kcal:  2000-2300kcal/day  Protein:  105-120g/day  Fluid:  1.8-2.1L/day  Torrance Freestone MS, RD, LDN If unable to be reached, please send secure chat to "RD inpatient" available from 8:00a-4:00p daily

## 2023-09-13 NOTE — Consult Note (Signed)
 PHARMACY - ANTICOAGULATION CONSULT NOTE  Pharmacy Consult for IV Heparin  Indication: pulmonary embolus  Patient Measurements: Height: 5' 7.99" (172.7 cm) Weight: 79.9 kg (176 lb 2.4 oz) IBW/kg (Calculated) : 68.38 HEPARIN  DW (KG): 77  Labs: Recent Labs    09/11/23 0307 09/12/23 0508 09/13/23 0234 09/13/23 1956  HGB 8.4* 8.6* 9.6*  --   HCT 25.1* 26.3* 28.9*  --   PLT 155 171 236  --   HEPARINUNFRC  --   --   --  0.22*  CREATININE 0.84 0.83 0.77  --     Estimated Creatinine Clearance: 85.5 mL/min (by C-G formula based on SCr of 0.77 mg/dL).   Medical History: Past Medical History:  Diagnosis Date   CHF (congestive heart failure) (HCC)    Chronic kidney disease    COPD (chronic obstructive pulmonary disease) (HCC)    Dementia (HCC)    Medications:  Heparin  gtt 4/20 - 4/21 Apixaban  4/21 - 4/23 Heparin  gtt 4/24 - 4/24 >> stopped for bleeding with suctioning Heparin  gtt 4/28 - 4/29 >> stopped for drop in Hgb / concern for GIB  Assessment: 68 y/o M with medical history including non-insulin -dependent diabetes mellitus, hyperlipidemia, neuropathy, intellectual learning difficulty/dementia, COPD, on baseline 4 L nasal cannula, heart failure preserved ejection fraction with prolonged ICU admission secondary to respiratory failure in setting of pneumonia and PE. Patient has been on and off anticoagulation throughout admission for PE treatment but there has been concern for GIB as well. Plan is to re-trial anticoagulation for PE treatment.  Last INR on 09/01/23 was 1.3, baseline aPTT on 09/01/23 was 33.   Discussed with MD, will do no bolus heparin  protocol with goal heparin  level 0.3 - 0.5 given bleeding history.  Date Time HL Rate/Comment 5/2 1956 0.22 SUBtherapeutic x1  Goal of Therapy:  Heparin  level 0.3 - 0.5 units/ml Monitor platelets by anticoagulation protocol: Yes   Plan:  Heparin  level subtherapeutic, no issues with infusion noted Increase heparin  infusion rate  to 850 units/hour without bolus Check heparin  level 6 hours after rate change Daily CBC per protocol while on IV heparin   Thank you for involving pharmacy in this patient's care.   Ananias Balls, PharmD Clinical Pharmacist 09/13/2023 8:42 PM

## 2023-09-13 NOTE — Evaluation (Signed)
 Clinical/Bedside Swallow Evaluation Patient Details  Name: Paul Vaughan MRN: [id number] Date of Birth: 1956/03/04  Today's Date: 09/13/2023  Past Medical History:  Past Medical History:  Diagnosis Date   CHF (congestive heart failure) (HCC)    Chronic kidney disease    COPD (chronic obstructive pulmonary disease) (HCC)    Dementia (HCC)    Past Surgical History: History reviewed. No pertinent surgical history. HPI:  Paul Vaughan is a 68 year old male with history of non-insulin -dependent diabetes mellitus, hyperlipidemia, neuropathy, intellectual learning difficulty/dementia, COPD, on baseline 4 L nasal cannula, heart failure preserved ejection fraction, who presents emergency department on 08/31/2023 for chief concerns of respiratory distress noticed by nursing staff at [facility].        Of note, he was recently admitted from 3/16 till 08/19/2023 when presented with generalized weakness, treated for pneumonia and rhabdo. Patient did have worsening respiratory status during that admission which was stabilized and he was discharged on 4 L of oxygen to SNF ([facility]).     Pt intubated 09/04/2023 thru 09/12/2023 (time 1420). Per chart, if pt decompensates, will initiate comfort measures.       Pt known to ST services with BSE on 09/01/2023 recommending dysphagia 1 diet with nectar thick liquids, medicine crushed in puree (when accepting of it).    Assessment / Plan / Recommendation  Clinical Impression  Consult received for bedside swallow evaluation s/p extubation on 09/12/2023. Pt was observed to be lethargic and unable to demonstrate effecient arousal for PO trials. In addition, he remains on heated HFNC with his nurse reports continued chest congestion that is not able to be suctioned by nursing or coughed up by pt. At this time, continue to recommend NPO with ST services to follow for PO readiness. Education provided to pt's attending and nurse on current plan.  SLP  Visit Diagnosis: Dysphagia, oropharyngeal phase (R13.12)    Aspiration Risk  Severe aspiration risk;Risk for inadequate nutrition/hydration    Diet Recommendation NPO    Medication Administration: Via alternative means    Other  Recommendations Oral Care Recommendations: Oral care QID    Recommendations for follow up therapy are one component of a multi-disciplinary discharge planning process, led by the attending physician.  Recommendations may be updated based on patient status, additional functional criteria and insurance authorization.  Follow up Recommendations Follow physician's recommendations for discharge plan and follow up therapies      Assistance Recommended at Discharge  N/A  Functional Status Assessment Patient has had a recent decline in their functional status and/or demonstrates limited ability to make significant improvements in function in a reasonable and predictable amount of time  Frequency and Duration min 2x/week  2 weeks       Prognosis Prognosis for improved oropharyngeal function: Guarded Barriers to Reach Goals: Cognitive deficits;Time post onset;Severity of deficits (decreased respiratory status)      Swallow Study   General Date of Onset: 08/31/23 HPI: Paul Vaughan is a 68 year old male with history of non-insulin -dependent diabetes mellitus, hyperlipidemia, neuropathy, intellectual learning difficulty/dementia, COPD, on baseline 4 L nasal cannula, heart failure preserved ejection fraction, who presents emergency department on 08/31/2023 for chief concerns of respiratory distress noticed by nursing staff at [facility].      Of note, he was recently admitted from 3/16 till 08/19/2023 when presented with generalized weakness, treated for pneumonia and rhabdo. Patient did had worsening respiratory status during that admission which was stabilizes and he was discharged on 4 L of oxygen to SNF ([facility]).    Pt intubated 09/04/2023 thru 09/12/2023  (time 1420). Per chart, if pt decompensates, will initiate comfort measures.     Pt known to ST services with BSE on 09/01/2023 recommending dysphagia 1 diet with necatr thick liquids, medicine crushed in puree (when accepting of it). Type of Study: Bedside Swallow Evaluation Previous Swallow Assessment: 08/16/23- mech soft, thins; 07/31/2023 - mech soft, thins -- this admit; 06/2021 - regular, thins Diet Prior to this Study: NPO Temperature Spikes Noted: No Respiratory Status:  (Heated HFNC) History of Recent Intubation: Yes Total duration of intubation (days): 8 days Date extubated: 09/12/23 Behavior/Cognition: Lethargic/Drowsy Oral Cavity - Dentition: Edentulous Patient Positioning: Upright in bed    Oral/Motor/Sensory Function     Ice Chips Ice chips: Not tested                Dajuana Palen B. Garlin Junker M.S., CCC-SLP, CBIS Speech-Language Pathologist Certified Brain Injury Specialist [facility]  [facility] [phone] Ascom [phone] Fax [phone]   Time: SLP Start Time (ACUTE ONLY): 1233 SLP Stop Time (ACUTE ONLY): 1241        SLP Time Calculation (min) (ACUTE ONLY): 8 min

## 2023-09-13 NOTE — Consult Note (Signed)
 PHARMACY - ANTICOAGULATION CONSULT NOTE  Pharmacy Consult for IV Heparin  Indication: pulmonary embolus  Patient Measurements: Height: 5' 7.99" (172.7 cm) Weight: 79.9 kg (176 lb 2.4 oz) IBW/kg (Calculated) : 68.38 HEPARIN  DW (KG): 77  Labs: Recent Labs    09/11/23 0307 09/12/23 0508 09/13/23 0234  HGB 8.4* 8.6* 9.6*  HCT 25.1* 26.3* 28.9*  PLT 155 171 236  CREATININE 0.84 0.83 0.77    Estimated Creatinine Clearance: 85.5 mL/min (by C-G formula based on SCr of 0.77 mg/dL).   Medical History: Past Medical History:  Diagnosis Date   CHF (congestive heart failure) (HCC)    Chronic kidney disease    COPD (chronic obstructive pulmonary disease) (HCC)    Dementia (HCC)    Medications:  Heparin  gtt 4/20 - 4/21 Apixaban  4/21 - 4/23 Heparin  gtt 4/24 - 4/24 >> stopped for bleeding with suctioning Heparin  gtt 4/28 - 4/29 >> stopped for drop in Hgb / concern for GIB  Assessment: 68 y/o M with medical history including non-insulin -dependent diabetes mellitus, hyperlipidemia, neuropathy, intellectual learning difficulty/dementia, COPD, on baseline 4 L nasal cannula, heart failure preserved ejection fraction with prolonged ICU admission secondary to respiratory failure in setting of pneumonia and PE. Patient has been on and off anticoagulation throughout admission for PE treatment but there has been concern for GIB as well. Plan is to re-trial anticoagulation for PE treatment.  Last INR on 09/01/23 was 1.3, baseline aPTT on 09/01/23 was 33.   Discussed with MD, will do no bolus heparin  protocol with goal heparin  level 0.3 - 0.5 given bleeding history.  Goal of Therapy:  Heparin  level 0.3 - 0.5 units/ml Monitor platelets by anticoagulation protocol: Yes   Plan:  --Start heparin  at previous rate of 800 units/hr --Check heparin  level 6 hours from initiation of infusion --Daily CBC per protocol while on IV heparin   Page Boast 09/13/2023,12:17 PM

## 2023-09-14 DIAGNOSIS — J189 Pneumonia, unspecified organism: Secondary | ICD-10-CM | POA: Diagnosis not present

## 2023-09-14 DIAGNOSIS — R6520 Severe sepsis without septic shock: Secondary | ICD-10-CM | POA: Diagnosis not present

## 2023-09-14 DIAGNOSIS — A419 Sepsis, unspecified organism: Secondary | ICD-10-CM | POA: Diagnosis not present

## 2023-09-14 DIAGNOSIS — Z515 Encounter for palliative care: Secondary | ICD-10-CM | POA: Diagnosis not present

## 2023-09-14 DIAGNOSIS — I2699 Other pulmonary embolism without acute cor pulmonale: Secondary | ICD-10-CM | POA: Diagnosis not present

## 2023-09-14 LAB — CBC
HCT: 29.6 % — ABNORMAL LOW (ref 39.0–52.0)
Hemoglobin: 9.9 g/dL — ABNORMAL LOW (ref 13.0–17.0)
MCH: 32 pg (ref 26.0–34.0)
MCHC: 33.4 g/dL (ref 30.0–36.0)
MCV: 95.8 fL (ref 80.0–100.0)
Platelets: 247 10*3/uL (ref 150–400)
RBC: 3.09 MIL/uL — ABNORMAL LOW (ref 4.22–5.81)
RDW: 15.7 % — ABNORMAL HIGH (ref 11.5–15.5)
WBC: 8.8 10*3/uL (ref 4.0–10.5)
nRBC: 0.3 % — ABNORMAL HIGH (ref 0.0–0.2)

## 2023-09-14 LAB — GLUCOSE, CAPILLARY
Glucose-Capillary: 172 mg/dL — ABNORMAL HIGH (ref 70–99)
Glucose-Capillary: 155 mg/dL — ABNORMAL HIGH (ref 70–99)
Glucose-Capillary: 197 mg/dL — ABNORMAL HIGH (ref 70–99)
Glucose-Capillary: 98 mg/dL (ref 70–99)
Glucose-Capillary: 156 mg/dL — ABNORMAL HIGH (ref 70–99)
Glucose-Capillary: 172 mg/dL — ABNORMAL HIGH (ref 70–99)

## 2023-09-14 LAB — HEPARIN LEVEL (UNFRACTIONATED)
Heparin Unfractionated: 0.28 [IU]/mL — ABNORMAL LOW (ref 0.30–0.70)
Heparin Unfractionated: 0.25 [IU]/mL — ABNORMAL LOW (ref 0.30–0.70)
Heparin Unfractionated: 0.3 [IU]/mL (ref 0.30–0.70)

## 2023-09-14 NOTE — Plan of Care (Signed)
  Problem: Respiratory: Goal: Ability to maintain adequate ventilation will improve Outcome: Progressing Pt weaned  down to 5L HFNC   Problem: Nutrition: Goal: Adequate nutrition will be maintained Outcome: Progressing   Problem: Elimination: Goal: Will not experience complications related to bowel motility Outcome: Progressing Pt had 1 loose BM today   Problem: Pain Managment: Goal: General experience of comfort will improve and/or be controlled Outcome: Progressing   Problem: Respiratory: Goal: Ability to maintain adequate ventilation will improve Outcome: Progressing   Problem: Activity: Goal: Risk for activity intolerance will decrease Outcome: Not Progressing   Problem: Activity: Goal: Ability to tolerate increased activity will improve Outcome: Not Progressing

## 2023-09-14 NOTE — Plan of Care (Signed)
  Problem: Fluid Volume: Goal: Hemodynamic stability will improve Outcome: Progressing   Problem: Clinical Measurements: Goal: Diagnostic test results will improve Outcome: Progressing Goal: Signs and symptoms of infection will decrease Outcome: Progressing   Problem: Respiratory: Goal: Ability to maintain adequate ventilation will improve Outcome: Progressing   Problem: Education: Goal: Knowledge of General Education information will improve Description: Including pain rating scale, medication(s)/side effects and non-pharmacologic comfort measures Outcome: Not Progressing

## 2023-09-14 NOTE — Progress Notes (Signed)
 Speech Language Pathology Treatment: Dysphagia  Patient Details Name: Paul Vaughan MRN: [id number] DOB: 24-Dec-1955 Today's Date: 09/14/2023 Time: 0900-0910 SLP Time Calculation (min) (ACUTE ONLY): 10 min  Assessment / Plan / Recommendation Clinical Impression  Pt seen for dysphagia intervention- targeting PO readiness. Initial eval limited secondary reduced alertness, which continues to impact pt's safety/ability to return to PO diet. Oral care completed with pt opening eyes for less than 1 minute. When prompted to try PO, pt shook head and then returned to lethargic state. Thermal (cold ice)/tactile/verbal cues applied without increase in alertness/response. Given somnolent state, pt is not safe for PO intake at this time. Recommend continued NPO with medications administered via alternative means. Frequent oral care. MD and RN aware of recommendations. SLP will follow up in 2-3 days to determine readiness for PO.    HPI HPI: Paul Vaughan is a 68 year old male with history of non-insulin -dependent diabetes mellitus, hyperlipidemia, neuropathy, intellectual learning difficulty/dementia, COPD, on baseline 4 L nasal cannula, heart failure preserved ejection fraction, who presents emergency department on 08/31/2023 for chief concerns of respiratory distress noticed by nursing staff at [facility].      Of note, he was recently admitted from 3/16 till 08/19/2023 when presented with generalized weakness, treated for pneumonia and rhabdo. Patient did had worsening respiratory status during that admission which was stabilizes and he was discharged on 4 L of oxygen to SNF ([facility]).    Pt intubated 09/04/2023 thru 09/12/2023 (time 1420). Per chart, if pt decompensates, will initiate comfort measures.     Pt known to ST services with BSE on 09/01/2023 recommending dysphagia 1 diet with necatr thick liquids, medicine crushed in puree (when accepting of it).      SLP Plan  Continue with current  plan of care      Recommendations for follow up therapy are one component of a multi-disciplinary discharge planning process, led by the attending physician.  Recommendations may be updated based on patient status, additional functional criteria and insurance authorization.    Recommendations  Diet recommendations: NPO Medication Administration: Via alternative means                  Oral care QID;Staff/trained caregiver to provide oral care   Frequent or constant Supervision/Assistance Dysphagia, oropharyngeal phase (R13.12)     Continue with current plan of care    [place] Tymel Conely Clapp, MS, CCC-SLP Speech Language Pathologist Rehab Services; [facility] [phone] (ascom)   [place] J Clapp  09/14/2023, 9:59 AM

## 2023-09-14 NOTE — Consult Note (Signed)
 PHARMACY - ANTICOAGULATION CONSULT NOTE  Pharmacy Consult for IV Heparin  Indication: pulmonary embolus  Patient Measurements: Height: 5' 7.99" (172.7 cm) Weight: 78.3 kg (172 lb 9.9 oz) IBW/kg (Calculated) : 68.38 HEPARIN  DW (KG): 77  Labs: Recent Labs    09/12/23 0508 09/13/23 0234 09/13/23 1956 09/14/23 0302  HGB 8.6* 9.6*  --  9.9*  HCT 26.3* 28.9*  --  29.6*  PLT 171 236  --  247  HEPARINUNFRC  --   --  0.22* 0.28*  CREATININE 0.83 0.77  --   --     Estimated Creatinine Clearance: 85.5 mL/min (by C-G formula based on SCr of 0.77 mg/dL).   Medical History: Past Medical History:  Diagnosis Date   CHF (congestive heart failure) (HCC)    Chronic kidney disease    COPD (chronic obstructive pulmonary disease) (HCC)    Dementia (HCC)    Medications:  Heparin  gtt 4/20 - 4/21 Apixaban  4/21 - 4/23 Heparin  gtt 4/24 - 4/24 >> stopped for bleeding with suctioning Heparin  gtt 4/28 - 4/29 >> stopped for drop in Hgb / concern for GIB  Assessment: 68 y/o M with medical history including non-insulin -dependent diabetes mellitus, hyperlipidemia, neuropathy, intellectual learning difficulty/dementia, COPD, on baseline 4 L nasal cannula, heart failure preserved ejection fraction with prolonged ICU admission secondary to respiratory failure in setting of pneumonia and PE. Patient has been on and off anticoagulation throughout admission for PE treatment but there has been concern for GIB as well. Plan is to re-trial anticoagulation for PE treatment.  Last INR on 09/01/23 was 1.3, baseline aPTT on 09/01/23 was 33.   Discussed with MD, will do no bolus heparin  protocol with goal heparin  level 0.3 - 0.5 given bleeding history.  Date Time HL Rate/Comment 5/2 1956 0.22 SUBtherapeutic x1 5/3 0302 0.28 Subtherapeutic  Goal of Therapy:  Heparin  level 0.3 - 0.5 units/ml Monitor platelets by anticoagulation protocol: Yes   Plan:  ---heparin  level subtherapeutic ---Increase heparin  infusion  rate to 1050 units/hour without bolus ---Check heparin  level 6 hours after rate change ---Daily CBC per protocol while on IV heparin   Thank you for involving pharmacy in this patient's care.   Barney Boozer, PharmD, BCPS 09/14/2023 7:12 AM

## 2023-09-14 NOTE — Progress Notes (Signed)
 Daily Progress Note   Patient Name: Paul Vaughan       Date: 09/14/2023 DOB: 05-11-1956  Age: 68 y.o. MRN#: [id number] Attending Physician: Tiajuana Fluke, MD Primary Care Physician: Meri Stammer, NP Admit Date: 08/31/2023  Reason for Consultation/Follow-up: Establishing goals of care  HPI/Brief Hospital Review: [name] is a 68 year old male with history of non-insulin -dependent diabetes mellitus, hyperlipidemia, neuropathy, intellectual learning difficulty/dementia, COPD, on baseline 4 L nasal cannula, heart failure preserved ejection fraction, who presents emergency department for chief concerns of respiratory distress noticed by nursing staff.    4/26 remains intubated and sedated Unable to tolerate heparin  due to bleeding, found to have PE, s/p bronch due to bilateral mucus plugging (4/24) Will require second bronchoscopy today   Successful extubation 5/1, Dobhoff in place for nutrition   SLP evaluation 5/2 remains high risk for aspiration, recommendations to remain Full Code at this time   Palliative Medicine consulted for assisting with goals of care conversations.  Subjective: Extensive chart review has been completed prior to meeting patient including labs, vital signs, imaging, progress notes, orders, and available advanced directive documents from current and previous encounters.    Visited with [name] at his bedside.  He is resting in bed with eyes closed attempts to open his eyes to calling of his name but easily falls back to sleep without redirection, he is unable to make verbal communication and he is unable to follow commands.  Spoke with the nursing staff regarding distended abdomen, they report multiple loose bowel movements yesterday and  overnight.  Last KUB from 2 days ago with no acute processes.  Nursing staff to continue to monitor.  SLP attempted reassessment earlier today but unable to complete evaluation due to [name] lack of ability to remain awake and alert enough to safely attempt.  Will continue to follow closely and reach out to [facility] guardian prior to ending of weekend to again discussed goals of care moving forward for [name].  PMT to continue to follow for ongoing needs and support.  Objective:  Physical Exam Constitutional:      General: He is not in acute distress.    Appearance: He is ill-appearing.  Pulmonary:     Effort: Pulmonary effort is normal. No respiratory distress.  Abdominal:  General: There is distension.     Tenderness: There is no abdominal tenderness.  Skin:    General: Skin is warm and dry.     Findings: Bruising present.  Neurological:     Mental Status: He is disoriented.     Motor: Weakness present.             Vital Signs: BP 116/67 (BP Location: Left Arm)   Pulse 88   Temp 98.3 F (36.8 C) (Axillary)   Resp 16   Ht 5' 7.99" (1.727 m)   Wt 78.3 kg   SpO2 97%   BMI 26.25 kg/m  SpO2: SpO2: 97 % O2 Device: O2 Device: High Flow Nasal Cannula O2 Flow Rate: O2 Flow Rate (L/min): 5 L/min   Palliative Care Assessment & Plan   Assessment/Recommendation/Plan  Time for outcomes Will engage with legal guardian likely tomorrow to continue goals of care discussion based on [name]'s progression  Care plan was discussed with nursing staff  Thank you for allowing the Palliative Medicine Team to assist in the care of this patient.  Total time: 35 minutes  Time spent includes: Detailed review of medical records (labs, imaging, vital signs), medically appropriate exam (mental status, respiratory, cardiac, skin), discussed with treatment team, counseling and educating patient, family and staff, documenting clinical information, medication  management and coordination of care.  [name], DNP, AGNP-C Palliative Medicine   Please contact Palliative Medicine Team phone at [phone] for questions and concerns.

## 2023-09-14 NOTE — Consult Note (Signed)
 PHARMACY - ANTICOAGULATION CONSULT NOTE  Pharmacy Consult for IV Heparin  Indication: pulmonary embolus  Patient Measurements: Height: 5' 7.99" (172.7 cm) Weight: 79.9 kg (176 lb 2.4 oz) IBW/kg (Calculated) : 68.38 HEPARIN  DW (KG): 77  Labs: Recent Labs    09/12/23 0508 09/13/23 0234 09/13/23 1956 09/14/23 0302  HGB 8.6* 9.6*  --  9.9*  HCT 26.3* 28.9*  --  29.6*  PLT 171 236  --  247  HEPARINUNFRC  --   --  0.22* 0.28*  CREATININE 0.83 0.77  --   --     Estimated Creatinine Clearance: 85.5 mL/min (by C-G formula based on SCr of 0.77 mg/dL).   Medical History: Past Medical History:  Diagnosis Date   CHF (congestive heart failure) (HCC)    Chronic kidney disease    COPD (chronic obstructive pulmonary disease) (HCC)    Dementia (HCC)    Medications:  Heparin  gtt 4/20 - 4/21 Apixaban  4/21 - 4/23 Heparin  gtt 4/24 - 4/24 >> stopped for bleeding with suctioning Heparin  gtt 4/28 - 4/29 >> stopped for drop in Hgb / concern for GIB  Assessment: 68 y/o M with medical history including non-insulin -dependent diabetes mellitus, hyperlipidemia, neuropathy, intellectual learning difficulty/dementia, COPD, on baseline 4 L nasal cannula, heart failure preserved ejection fraction with prolonged ICU admission secondary to respiratory failure in setting of pneumonia and PE. Patient has been on and off anticoagulation throughout admission for PE treatment but there has been concern for GIB as well. Plan is to re-trial anticoagulation for PE treatment.  Last INR on 09/01/23 was 1.3, baseline aPTT on 09/01/23 was 33.   Discussed with MD, will do no bolus heparin  protocol with goal heparin  level 0.3 - 0.5 given bleeding history.  Date Time HL Rate/Comment 5/2 1956 0.22 SUBtherapeutic x1 5/3 0302 0.28 Subtherapeutic  Goal of Therapy:  Heparin  level 0.3 - 0.5 units/ml Monitor platelets by anticoagulation protocol: Yes   Plan:  Heparin  level subtherapeutic Increase heparin  infusion rate  to 950 units/hour without bolus Check heparin  level 6 hours after rate change Daily CBC per protocol while on IV heparin   Thank you for involving pharmacy in this patient's care.   Coretta Dexter, PharmD, [facility] 09/14/2023 4:27 AM

## 2023-09-14 NOTE — Progress Notes (Signed)
 PROGRESS NOTE    Paul Vaughan  NGE:[id number] DOB: 11-Dec-1955 DOA: 08/31/2023 PCP: Meri Stammer, NP    Brief Narrative:  Paul Vaughan is a 68 year old male with history of non-insulin -dependent diabetes mellitus, hyperlipidemia, neuropathy, intellectual learning difficulty/dementia, COPD, on baseline 4 L nasal cannula, heart failure preserved ejection fraction, who presents emergency department for chief concerns of respiratory distress noticed by nursing staff.    Of note, he was recently admitted from 3/16 till 08/19/2023 when presented with generalized weakness, treated for pneumonia and rhabdo. Patient did had worsening respiratory status during that admission which was stabilizes and he was discharged on 4 L of oxygen to SNF.    5/2: Transferred to [facility] service.  Patient was successfully extubated to heated high flow nasal cannula on 5/1.   Assessment & Plan:   Principal Problem:   Severe sepsis with acute organ dysfunction (HCC) Active Problems:   Multifocal pneumonia   Acute on chronic hypoxic respiratory failure (HCC)   Acute pulmonary embolism (HCC)   AKI (acute kidney injury) (HCC)   Elevated LFTs   Essential (primary) hypertension   Chronic diastolic CHF (congestive heart failure) (HCC)   Adjustment disorder with mixed disturbance of emotions and conduct   Chronic depression   Hyperlipidemia, unspecified   Borderline intellectual disability   Malnutrition of moderate degree  #Acute Hypoxic Respiratory Failure  #Multifocal Pneumonia~Aspiration #Pulmonary Embolism #Pulmonary Edema & Pleural effusion #Mucus plugging Hx: COPD on baseline 4L  CTA Chest on 4/20: revealed PE in the right superior pulmonary artery, along with patchy airspace opacities bilaterally with consolidate changes in the LUL consistent with multifocal pneumonia Patient successfully extubated to heated high flow nasal cannula on 5/1.  Regular sedation thus far.  Per palliative care if  patient decompensates will transition to full comfort measures Plan: Continue IV antibiotic Continue stress dose hydrocortisone  for now Continue hep gtt, no bleeding noted If patient bleeds may contact GI for endoscopy consideration Bronchodilators Wean oxygen as tolerated  #Hypotension: septic shock +/- sedation related #Septic shock #Acute decompensated HFmrEF Echocardiogram 09/02/23: LVEF 45-50%, mild LVH, indeterminate diastolic parameters, RV not well visualized Shock physiology has resolved.  Blood pressure has improved.  Lactic acid normalized.  Troponin negative x 2. Plan: Continue lasix  40 IV daily Daily renal parameters  #Acute blood loss anemia due to hemoptysis vs possible GI bleed ~improving  Hemoglobin stable but patient has had 2 episodes of bleeding after initiation of heparin  gtt. Plan: Continue hep gtt Trend CBC BID IV PPI Transfuse <7  #Type II diabetes mellitus  - CBG's q4h; Target range of 140 to 180 - SSI  #AKI~resolved  #Hypernatremia  #Hypophosphatemia~resolved   - Trend BMP  - Replace electrolytes as indicated ~ Pharmacy following for assistance with electrolyte replacement - Strict I&O's - Avoid nephrotoxic agents - Increased free water  flushes to 200 ml q6hrs     DVT prophylaxis: hep gtt Code Status: DNR Family Communication: DSS case workers x 2 Disposition Plan: Status is: Inpatient Remains inpatient appropriate because: Multiple acute issues as above   Level of care: Stepdown  Consultants:  Palliative care  Procedures:  Bronchoscopy x 2  Antimicrobials: Cefepime  Vancomycin     Subjective: Seen and examined.  Appears fatigued and frail.  Visibly no distress.  Unable to participate in interview.  Objective: Vitals:   09/14/23 1017 09/14/23 1100 09/14/23 1103 09/14/23 1121  BP:  (!) 78/50 107/62   Pulse: (!) 120 (!) 107 (!) 111 (!) 112  Resp: 14 16 18  15  Temp:      TempSrc:      SpO2: 99% 97% 99% 100%  Weight:       Height:        Intake/Output Summary (Last 24 hours) at 09/14/2023 1128 Last data filed at 09/14/2023 0800 Gross per 24 hour  Intake 2315.79 ml  Output 3100 ml  Net -784.21 ml   Filed Weights   09/12/23 0500 09/13/23 0408 09/14/23 0500  Weight: 79.9 kg 79.9 kg 78.3 kg    Examination:  General exam: Acutely ill-appearing Respiratory system: Coarse breath sounds bl.  Normal WOB.  Level [place] at 8 L Cardiovascular system: S1-S2, tachycardic, regular rhythm, no murmurs, no pedal edema Gastrointestinal system: Soft, NT/ND, normal bowel sounds Central nervous system: Awake.  Unable to assess orientation Extremities: Marked decreased power Skin: No rashes, lesions or ulcers Psychiatry: Unable to assess    Data Reviewed: I have personally reviewed following labs and imaging studies  CBC: Recent Labs  Lab 09/10/23 0540 09/10/23 1400 09/11/23 0307 09/12/23 0508 09/13/23 0234 09/14/23 0302  WBC 16.8*  --  13.2* 9.1 11.5* 8.8  NEUTROABS  --   --   --  5.8 7.9*  --   HGB 6.9* 8.1* 8.4* 8.6* 9.6* 9.9*  HCT 22.1* 24.5* 25.1* 26.3* 28.9* 29.6*  MCV 103.3*  --  96.2 100.0 96.0 95.8  PLT 144*  --  155 171 236 247   Basic Metabolic Panel: Recent Labs  Lab 09/09/23 0309 09/10/23 0540 09/11/23 0307 09/12/23 0508 09/13/23 0234  NA 145 140 148* 149* 146*  K 4.8 5.0 4.6 4.0 3.6  CL 104 104 108 109 107  CO2 32 33* 32 33* 33*  GLUCOSE 267* 235* 172* 171* 150*  BUN 63* 69* 84* 74* 69*  CREATININE 1.30* 1.19 0.84 0.83 0.77  CALCIUM  9.0 8.6* 8.7* 8.3* 8.5*  MG 2.7* 2.7* 2.7* 2.4 2.3  PHOS 3.4 2.4* 2.2* 3.4 3.4   GFR: Estimated Creatinine Clearance: 85.5 mL/min (by C-G formula based on SCr of 0.77 mg/dL). Liver Function Tests: Recent Labs  Lab 09/08/23 0218 09/09/23 0309 09/10/23 0540  ALBUMIN  1.8* 1.6* 2.2*   No results for input(s): "LIPASE", "AMYLASE" in the last 168 hours. Recent Labs  Lab 09/13/23 0234  AMMONIA 28   Coagulation Profile: No results for input(s):  "INR", "PROTIME" in the last 168 hours. Cardiac Enzymes: No results for input(s): "CKTOTAL", "CKMB", "CKMBINDEX", "TROPONINI" in the last 168 hours. BNP (last 3 results) No results for input(s): "PROBNP" in the last 8760 hours. HbA1C: No results for input(s): "HGBA1C" in the last 72 hours. CBG: Recent Labs  Lab 09/13/23 1542 09/13/23 1955 09/13/23 2340 09/14/23 0355 09/14/23 0751  GLUCAP 135* 139* 224* 172* 155*   Lipid Profile: No results for input(s): "CHOL", "HDL", "LDLCALC", "TRIG", "CHOLHDL", "LDLDIRECT" in the last 72 hours. Thyroid Function Tests: No results for input(s): "TSH", "T4TOTAL", "FREET4", "T3FREE", "THYROIDAB" in the last 72 hours. Anemia Panel: No results for input(s): "VITAMINB12", "FOLATE", "FERRITIN", "TIBC", "IRON", "RETICCTPCT" in the last 72 hours. Sepsis Labs: Recent Labs  Lab 09/08/23 0218  PROCALCITON 1.16    Recent Results (from the past 240 hours)  Culture, Respiratory w Gram Stain     Status: None   Collection Time: 09/04/23  8:11 PM   Specimen: Tracheal Aspirate; Respiratory  Result Value Ref Range Status   Specimen Description   Final    TRACHEAL ASPIRATE Performed at [facility], [address] [place], [place] [address]    Special  Requests   Final    NONE Performed at [facility], [address] [place], [place] [address]    Gram Stain   Final    FEW WBC PRESENT,BOTH PMN AND MONONUCLEAR RARE GRAM POSITIVE COCCI IN CLUSTERS IN PAIRS    Culture   Final    RARE Normal respiratory flora-no Staph aureus or Pseudomonas seen Performed at [facility], [address], [place], [place] [address]    Report Status 09/07/2023 FINAL  Final  Culture, BAL-quantitative w Gram Stain     Status: Abnormal   Collection Time: 09/05/23  4:04 PM   Specimen: Bronchoalveolar Lavage; Respiratory  Result Value Ref Range Status   Specimen Description   Final    BRONCHIAL ALVEOLAR LAVAGE Performed at [facility],  [address], [place], [place] [address]    Special Requests   Final    NONE Performed at [facility], [address] [place], [place] [address]    Gram Stain   Final    ABUNDANT WBC PRESENT, PREDOMINANTLY PMN NO ORGANISMS SEEN    Culture (A)  Final    30,000 COLONIES/mL Normal respiratory flora-no Staph aureus or Pseudomonas seen Performed at [facility], [address] [place], [place] [address]    Report Status 09/07/2023 FINAL  Final  Culture, BAL-quantitative w Gram Stain     Status: Abnormal   Collection Time: 09/07/23 12:37 PM   Specimen: Bronchoalveolar Lavage; Respiratory  Result Value Ref Range Status   Specimen Description   Final    BRONCHIAL ALVEOLAR LAVAGE Performed at [facility], [address] [place], [place] [address]    Special Requests   Final    NONE Performed at [facility], [address] [place], [place] [address]    Gram Stain   Final    FEW WBC PRESENT, PREDOMINANTLY PMN NO ORGANISMS SEEN    Culture (A)  Final    20,000 COLONIES/mL Consistent with normal respiratory flora. No Pseudomonas species isolated Performed at [facility], [address], [place], [place] [address]    Report Status 09/10/2023 FINAL  Final  MRSA Next Gen by PCR, Nasal     Status: Abnormal   Collection Time: 09/09/23 10:52 AM   Specimen: Nasal Mucosa; Nasal Swab  Result Value Ref Range Status   MRSA by PCR Next Gen DETECTED (A) NOT DETECTED Final    Comment: RESULT CALLED TO, READ BACK BY AND VERIFIED WITH: JUAN RODRIGUEZ 1215 09/09/23 MU (NOTE) The GeneXpert MRSA Assay (FDA approved for NASAL specimens only), is one component of a comprehensive MRSA colonization surveillance program. It is not intended to diagnose MRSA infection nor to guide or monitor treatment for MRSA infections. Test performance is not FDA approved in patients less than 62 years old. Performed at [facility], [address]  [place], [place] [address]          Radiology Studies: [facility] Chest [place] 1 View Result Date: 09/13/2023 CLINICAL DATA:  Shortness of breath. EXAM: PORTABLE CHEST 1 VIEW COMPARISON:  September 11, 2023. FINDINGS: Stable cardiomediastinal silhouette. Feeding tube is seen entering stomach. Stable bilateral reticulonodular opacities are noted, left greater than right, which may represent scarring or atypical inflammation. Bony thorax is unremarkable. IMPRESSION: Stable bilateral reticulonodular opacities are noted, left greater than right, which may represent scarring or atypical inflammation. Electronically Signed   By: Rosalene Colon M.D.   On: 09/13/2023 18:28   DG  Abd 1 View Result Date: 09/12/2023 CLINICAL DATA:  [id number] Nasogastric tube present [id number] EXAM: ABDOMEN - 1 VIEW COMPARISON:  Radiographs 09/08/2023 and 09/04/2023.  CT 07/28/2023. FINDINGS: 1221 hours. Enteric tube has been removed. There is a new weighted feeding tube with its tip overlying the right L2 transverse process, likely in the distal stomach or proximal duodenum. The visualized bowel gas pattern is nonobstructive. IMPRESSION: Weighted feeding tube tip likely in the distal stomach or proximal duodenum. Electronically Signed   By: Elmon Hagedorn M.D.   On: 09/12/2023 14:04        Scheduled Meds:  Chlorhexidine  Gluconate Cloth  6 each Topical Q1400   docusate  100 mg Per Tube BID   escitalopram   5 mg Per Tube QHS   feeding supplement (PROSource TF20)  60 mL Per Tube Daily   free water   200 mL Per Tube Q6H   furosemide   40 mg Intravenous Daily   gabapentin   100 mg Per Tube Q8H   hydrocortisone  sod succinate (SOLU-CORTEF ) inj  50 mg Intravenous Q6H   insulin  aspart  0-20 Units Subcutaneous Q4H   insulin  aspart  5 Units Subcutaneous Q4H   insulin  glargine-yfgn  15 Units Subcutaneous BID   ipratropium-albuterol   3 mL Nebulization TID   nystatin   5 mL Oral QID   mouth rinse  15 mL Mouth Rinse 4 times per day   pantoprazole   (PROTONIX ) IV  40 mg Intravenous Q12H   polyethylene glycol  17 g Per Tube Daily   QUEtiapine   150 mg Per Tube QHS   QUEtiapine   50 mg Per Tube 2 times per day   thiamine   100 mg Per Tube Daily   valproic  acid  500 mg Per Tube TID   Continuous Infusions:  feeding supplement (VITAL 1.5 CAL) 60 mL/hr at 09/14/23 0700   heparin  950 Units/hr (09/14/23 0700)     LOS: 14 days     Tiajuana Fluke, MD [facility]   If 7PM-7AM, please contact night-coverage  09/14/2023, 11:28 AM

## 2023-09-14 NOTE — Consult Note (Signed)
 PHARMACY - ANTICOAGULATION CONSULT NOTE  Pharmacy Consult for IV Heparin  Indication: pulmonary embolus  Patient Measurements: Height: 5' 7.99" (172.7 cm) Weight: 78.3 kg (172 lb 9.9 oz) IBW/kg (Calculated) : 68.38 HEPARIN  DW (KG): 77  Labs: Recent Labs    09/12/23 0508 09/13/23 0234 09/13/23 1956 09/14/23 0302 09/14/23 1111 09/14/23 1956  HGB 8.6* 9.6*  --  9.9*  --   --   HCT 26.3* 28.9*  --  29.6*  --   --   PLT 171 236  --  247  --   --   HEPARINUNFRC  --   --    < > 0.28* 0.25* 0.30  CREATININE 0.83 0.77  --   --   --   --    < > = values in this interval not displayed.    Estimated Creatinine Clearance: 85.5 mL/min (by C-G formula based on SCr of 0.77 mg/dL).   Medical History: Past Medical History:  Diagnosis Date   CHF (congestive heart failure) (HCC)    Chronic kidney disease    COPD (chronic obstructive pulmonary disease) (HCC)    Dementia (HCC)    Medications:  Heparin  gtt 4/20 - 4/21 Apixaban  4/21 - 4/23 Heparin  gtt 4/24 - 4/24 >> stopped for bleeding with suctioning Heparin  gtt 4/28 - 4/29 >> stopped for drop in Hgb / concern for GIB  Assessment: 68 y/o M with medical history including non-insulin -dependent diabetes mellitus, hyperlipidemia, neuropathy, intellectual learning difficulty/dementia, COPD, on baseline 4 L nasal cannula, heart failure preserved ejection fraction with prolonged ICU admission secondary to respiratory failure in setting of pneumonia and PE. Patient has been on and off anticoagulation throughout admission for PE treatment but there has been concern for GIB as well. Plan is to re-trial anticoagulation for PE treatment.  Last INR on 09/01/23 was 1.3, baseline aPTT on 09/01/23 was 33.   Discussed with MD, will do no bolus heparin  protocol with goal heparin  level 0.3 - 0.5 given bleeding history.  Date Time HL Rate/Comment 5/2 1956 0.22 SUBtherapeutic x1 5/3 0302 0.28 Subtherapeutic 5/3 1956 0.30 Therapeutic x1  Goal of Therapy:   Heparin  level 0.3 - 0.5 units/ml Monitor platelets by anticoagulation protocol: Yes   Plan:  ---heparin  level therapeutic but at low end of goal  ---Increase heparin  infusion rate slightly to 1100 units/hour ---Check confirmatory heparin  level 6 hours after rate change ---Daily CBC per protocol while on IV heparin   Thank you for involving pharmacy in this patient's care.   Ananias Balls, PharmD Clinical Pharmacist 09/14/2023 8:39 PM

## 2023-09-15 DIAGNOSIS — Z515 Encounter for palliative care: Secondary | ICD-10-CM | POA: Diagnosis not present

## 2023-09-15 DIAGNOSIS — R4183 Borderline intellectual functioning: Secondary | ICD-10-CM

## 2023-09-15 DIAGNOSIS — I2699 Other pulmonary embolism without acute cor pulmonale: Secondary | ICD-10-CM | POA: Diagnosis not present

## 2023-09-15 DIAGNOSIS — A419 Sepsis, unspecified organism: Secondary | ICD-10-CM | POA: Diagnosis not present

## 2023-09-15 DIAGNOSIS — J189 Pneumonia, unspecified organism: Secondary | ICD-10-CM | POA: Diagnosis not present

## 2023-09-15 DIAGNOSIS — R6520 Severe sepsis without septic shock: Secondary | ICD-10-CM | POA: Diagnosis not present

## 2023-09-15 LAB — BASIC METABOLIC PANEL WITH GFR
Anion gap: 12 (ref 5–15)
BUN: 58 mg/dL — ABNORMAL HIGH (ref 8–23)
CO2: 36 mmol/L — ABNORMAL HIGH (ref 22–32)
Calcium: 8.6 mg/dL — ABNORMAL LOW (ref 8.9–10.3)
Chloride: 99 mmol/L (ref 98–111)
Creatinine, Ser: 0.67 mg/dL (ref 0.61–1.24)
GFR, Estimated: >60 mL/min (ref >60)
Glucose, Bld: 115 mg/dL — ABNORMAL HIGH (ref 70–99)
Potassium: 3.2 mmol/L — ABNORMAL LOW (ref 3.5–5.1)
Sodium: 147 mmol/L — ABNORMAL HIGH (ref 135–145)

## 2023-09-15 LAB — GLUCOSE, CAPILLARY
Glucose-Capillary: 105 mg/dL — ABNORMAL HIGH (ref 70–99)
Glucose-Capillary: 107 mg/dL — ABNORMAL HIGH (ref 70–99)
Glucose-Capillary: 115 mg/dL — ABNORMAL HIGH (ref 70–99)
Glucose-Capillary: 155 mg/dL — ABNORMAL HIGH (ref 70–99)
Glucose-Capillary: 217 mg/dL — ABNORMAL HIGH (ref 70–99)
Glucose-Capillary: 83 mg/dL (ref 70–99)
Glucose-Capillary: 87 mg/dL (ref 70–99)

## 2023-09-15 LAB — CBC
HCT: 33.4 % — ABNORMAL LOW (ref 39.0–52.0)
Hemoglobin: 10.9 g/dL — ABNORMAL LOW (ref 13.0–17.0)
MCH: 31.5 pg (ref 26.0–34.0)
MCHC: 32.6 g/dL (ref 30.0–36.0)
MCV: 96.5 fL (ref 80.0–100.0)
Platelets: 295 10*3/uL (ref 150–400)
RBC: 3.46 MIL/uL — ABNORMAL LOW (ref 4.22–5.81)
RDW: 16.1 % — ABNORMAL HIGH (ref 11.5–15.5)
WBC: 10.6 10*3/uL — ABNORMAL HIGH (ref 4.0–10.5)
nRBC: 0.2 % (ref 0.0–0.2)

## 2023-09-15 LAB — BLOOD GAS, VENOUS
Acid-Base Excess: 2 mmol/L (ref 0.0–2.0)
Acid-Base Excess: 6.4 mmol/L — ABNORMAL HIGH (ref 0.0–2.0)
Acid-base deficit: 0.7 mmol/L (ref 0.0–2.0)
Bicarbonate: 27.8 mmol/L (ref 20.0–28.0)
Bicarbonate: 25.8 mmol/L (ref 20.0–28.0)
Bicarbonate: 31.9 mmol/L — ABNORMAL HIGH (ref 20.0–28.0)
O2 Saturation: 30.9 %
O2 Saturation: 33 %
O2 Saturation: 14.7 %
Patient temperature: 37
Patient temperature: 37
Patient temperature: 37
pCO2, Ven: 47 mmHg (ref 44–60)
pCO2, Ven: 49 mmHg (ref 44–60)
pCO2, Ven: 48 mmHg (ref 44–60)
pH, Ven: 7.38 (ref 7.25–7.43)
pH, Ven: 7.33 (ref 7.25–7.43)
pH, Ven: 7.43 (ref 7.25–7.43)

## 2023-09-15 LAB — HEPARIN LEVEL (UNFRACTIONATED)
Heparin Unfractionated: 0.31 [IU]/mL (ref 0.30–0.70)
Heparin Unfractionated: 0.37 [IU]/mL (ref 0.30–0.70)

## 2023-09-15 MED ORDER — HYDROCORTISONE SOD SUC (PF) 100 MG IJ SOLR
50.0000 mg | Freq: Three times a day (TID) | INTRAMUSCULAR | Status: DC
  Administered 2023-09-15 – 2023-09-16 (×2): 50 mg via INTRAVENOUS
  Filled 2023-09-15 (×2): qty 2

## 2023-09-15 MED ORDER — FUROSEMIDE 10 MG/ML IJ SOLN
40.0000 mg | Freq: Every day | INTRAMUSCULAR | Status: DC
  Administered 2023-09-16: 40 mg via INTRAVENOUS
  Filled 2023-09-15: qty 4

## 2023-09-15 NOTE — Consult Note (Signed)
 PHARMACY - ANTICOAGULATION CONSULT NOTE  Pharmacy Consult for IV Heparin  Indication: pulmonary embolus  Patient Measurements: Height: 5' 7.99" (172.7 cm) Weight: 74.4 kg (164 lb 0.4 oz) IBW/kg (Calculated) : 68.38 HEPARIN  DW (KG): 77  Labs: Recent Labs    09/13/23 0234 09/13/23 1956 09/14/23 0302 09/14/23 1111 09/14/23 1956 09/15/23 0302 09/15/23 1812  HGB 9.6*  --  9.9*  --   --  10.9*  --   HCT 28.9*  --  29.6*  --   --  33.4*  --   PLT 236  --  247  --   --  295  --   HEPARINUNFRC  --    < > 0.28*   < > 0.30 0.31 0.37  CREATININE 0.77  --   --   --   --  0.67  --    < > = values in this interval not displayed.    Estimated Creatinine Clearance: 85.5 mL/min (by C-G formula based on SCr of 0.67 mg/dL).   Medical History: Past Medical History:  Diagnosis Date   CHF (congestive heart failure) (HCC)    Chronic kidney disease    COPD (chronic obstructive pulmonary disease) (HCC)    Dementia (HCC)    Medications:  Heparin  gtt 4/20 - 4/21 Apixaban  4/21 - 4/23 Heparin  gtt 4/24 - 4/24 >> stopped for bleeding with suctioning Heparin  gtt 4/28 - 4/29 >> stopped for drop in Hgb / concern for GIB  Assessment: 68 y/o M with medical history including non-insulin -dependent diabetes mellitus, hyperlipidemia, neuropathy, intellectual learning difficulty/dementia, COPD, on baseline 4 L nasal cannula, heart failure preserved ejection fraction with prolonged ICU admission secondary to respiratory failure in setting of pneumonia and PE. Patient has been on and off anticoagulation throughout admission for PE treatment but there has been concern for GIB as well. Plan is to re-trial anticoagulation for PE treatment.  Last INR on 09/01/23 was 1.3, baseline aPTT on 09/01/23 was 33.   Discussed with MD, will do no bolus heparin  protocol with goal heparin  level 0.3 - 0.5 given bleeding history.  Date Time HL Rate/Comment 5/2 1956 0.22 SUBtherapeutic  x1 5/3 0302 0.28 Subtherapeutic 5/3 1956 0.30 Therapeutic x1 5/4 0302 0.31 Therapeutic x 2 5/4 1812 0.37 Therapeutic x3  Goal of Therapy:  Heparin  level 0.3 - 0.5 units/ml Monitor platelets by anticoagulation protocol: Yes   Plan:  ---Continue heparin  infusion rate at 1100 units/hour ---Will check HL in AM ---Monitor daily heparin  levels while on heparin  infusion ---Daily CBC per protocol while on IV heparin   Thank you for involving pharmacy in this patient's care.   Ananias Balls, PharmD Clinical Pharmacist 09/15/2023 6:32 PM

## 2023-09-15 NOTE — Progress Notes (Signed)
 Daily Progress Note   Patient Name: Paul Vaughan       Date: 09/15/2023 DOB: 07/23/1955  Age: 68 y.o. MRN#: [id number] Attending Physician: Tiajuana Fluke, MD Primary Care Physician: Meri Stammer, NP Admit Date: 08/31/2023  Reason for Consultation/Follow-up: Establishing goals of care  HPI/Brief Hospital Review: Paul Vaughan is a 68 year old male with history of non-insulin -dependent diabetes mellitus, hyperlipidemia, neuropathy, intellectual learning difficulty/dementia, COPD, on baseline 4 L nasal cannula, heart failure preserved ejection fraction, who presents emergency department for chief concerns of respiratory distress noticed by nursing staff.    4/26 remains intubated and sedated Unable to tolerate heparin  due to bleeding, found to have PE, s/p bronch due to bilateral mucus plugging (4/24) Will require second bronchoscopy today   Successful extubation 5/1, Dobhoff in place for nutrition   SLP evaluation 5/2 remains high risk for aspiration, recommendations to remain Full Code at this time   Palliative Medicine consulted for assisting with goals of care conversations.  Subjective: Extensive chart review has been completed prior to meeting patient including labs, vital signs, imaging, progress notes, orders, and available advanced directive documents from current and previous encounters.    Visited with Paul Vaughan at his bedside.  He is resting in bed with eyes closed but does awaken and open his eyes to calling of his name.  He remains unable to follow commands.  While attempting to assess orientation Paul Vaughan did open his eyes and speak a sentence requesting a sip of [employer].  I attempted to explain to him reasoning for being unable to provide him with  any food or liquid due to his aspiration risk.  During the rest of the visit Paul Vaughan remained with his eyes closed and did not engage in any further.  Paul Vaughan will need to be reevaluated weighted by SLP with hopes that his cognition continues to improve.  Unclear if this is related to an underlying behavioral issue.  Will need to await SLP evaluation prior to updating legal guardian/SW.  If fails next SLP evaluation conversations need to be had regarding long-term plans for nutrition versus transitioning to comfort care.  No significant improvement in cognition since extubation with ongoing concern for poor long-term prognosis.  Care plan was discussed with primary team and nursing staff  Thank you for allowing the  Palliative Medicine Team to assist in the care of this patient.  Total time: 25 minutes  Time spent includes: Detailed review of medical records (labs, imaging, vital signs), medically appropriate exam (mental status, respiratory, cardiac, skin), discussed with treatment team, counseling and educating patient, family and staff, documenting clinical information, medication management and coordination of care.  [name], DNP, AGNP-C Palliative Medicine   Please contact Palliative Medicine Team phone at [phone] for questions and concerns.

## 2023-09-15 NOTE — Consult Note (Signed)
 PHARMACY - ANTICOAGULATION CONSULT NOTE  Pharmacy Consult for IV Heparin  Indication: pulmonary embolus  Patient Measurements: Height: 5' 7.99" (172.7 cm) Weight: 78.3 kg (172 lb 9.9 oz) IBW/kg (Calculated) : 68.38 HEPARIN  DW (KG): 77  Labs: Recent Labs    09/12/23 0508 09/13/23 0234 09/13/23 1956 09/14/23 0302 09/14/23 1111 09/14/23 1956 09/15/23 0302  HGB 8.6* 9.6*  --  9.9*  --   --  10.9*  HCT 26.3* 28.9*  --  29.6*  --   --  33.4*  PLT 171 236  --  247  --   --  295  HEPARINUNFRC  --   --    < > 0.28* 0.25* 0.30 0.31  CREATININE 0.83 0.77  --   --   --   --   --    < > = values in this interval not displayed.    Estimated Creatinine Clearance: 85.5 mL/min (by C-G formula based on SCr of 0.77 mg/dL).   Medical History: Past Medical History:  Diagnosis Date   CHF (congestive heart failure) (HCC)    Chronic kidney disease    COPD (chronic obstructive pulmonary disease) (HCC)    Dementia (HCC)    Medications:  Heparin  gtt 4/20 - 4/21 Apixaban  4/21 - 4/23 Heparin  gtt 4/24 - 4/24 >> stopped for bleeding with suctioning Heparin  gtt 4/28 - 4/29 >> stopped for drop in Hgb / concern for GIB  Assessment: 68 y/o M with medical history including non-insulin -dependent diabetes mellitus, hyperlipidemia, neuropathy, intellectual learning difficulty/dementia, COPD, on baseline 4 L nasal cannula, heart failure preserved ejection fraction with prolonged ICU admission secondary to respiratory failure in setting of pneumonia and PE. Patient has been on and off anticoagulation throughout admission for PE treatment but there has been concern for GIB as well. Plan is to re-trial anticoagulation for PE treatment.  Last INR on 09/01/23 was 1.3, baseline aPTT on 09/01/23 was 33.   Discussed with MD, will do no bolus heparin  protocol with goal heparin  level 0.3 - 0.5 given bleeding history.  Date Time HL Rate/Comment 5/2 1956 0.22 SUBtherapeutic  x1 5/3 0302 0.28 Subtherapeutic 5/3 1956 0.30 Therapeutic x1 5/4 0302 0.31 Therapeutic x 2  Goal of Therapy:  Heparin  level 0.3 - 0.5 units/ml Monitor platelets by anticoagulation protocol: Yes   Plan:  ---Continue heparin  infusion rate at 1100 units/hour ---With HL still at lower end of goal, will recheck HL at 1800 to reconfirm ---Daily CBC per protocol while on IV heparin   Thank you for involving pharmacy in this patient's care.   Coretta Dexter, PharmD, MBA 09/15/2023 4:12 AM

## 2023-09-15 NOTE — Plan of Care (Signed)
 Continuing with plan of care.

## 2023-09-15 NOTE — Plan of Care (Signed)
  Problem: Fluid Volume: Goal: Hemodynamic stability will improve Outcome: Progressing   Problem: Clinical Measurements: Goal: Signs and symptoms of infection will decrease Outcome: Progressing   Problem: Respiratory: Goal: Ability to maintain adequate ventilation will improve Outcome: Progressing   Problem: Clinical Measurements: Goal: Diagnostic test results will improve Outcome: Progressing Goal: Respiratory complications will improve Outcome: Progressing Goal: Cardiovascular complication will be avoided Outcome: Progressing   Problem: Nutrition: Goal: Adequate nutrition will be maintained Outcome: Progressing   Problem: Elimination: Goal: Will not experience complications related to bowel motility Outcome: Progressing Goal: Will not experience complications related to urinary retention Outcome: Progressing   Problem: Safety: Goal: Ability to remain free from injury will improve Outcome: Progressing

## 2023-09-15 NOTE — Progress Notes (Signed)
 PROGRESS NOTE    Paul Vaughan  EAV:[id number] DOB: 1955-09-12 DOA: 08/31/2023 PCP: Meri Stammer, NP    Brief Narrative:  Paul Vaughan is a 68 year old male with history of non-insulin -dependent diabetes mellitus, hyperlipidemia, neuropathy, intellectual learning difficulty/dementia, COPD, on baseline 4 L nasal cannula, heart failure preserved ejection fraction, who presents emergency department for chief concerns of respiratory distress noticed by nursing staff.    Of note, he was recently admitted from 3/16 till 08/19/2023 when presented with generalized weakness, treated for pneumonia and rhabdo. Patient did had worsening respiratory status during that admission which was stabilizes and he was discharged on 4 L of oxygen to SNF.    5/2: Transferred to [facility] service.  Patient was successfully extubated to heated high flow nasal cannula on 5/1.  5/4: Oxygen status improving.  Weaned to 5 L nasal cannula   Assessment & Plan:   Principal Problem:   Severe sepsis with acute organ dysfunction (HCC) Active Problems:   Multifocal pneumonia   Acute on chronic hypoxic respiratory failure (HCC)   Acute pulmonary embolism (HCC)   AKI (acute kidney injury) (HCC)   Elevated LFTs   Essential (primary) hypertension   Chronic diastolic CHF (congestive heart failure) (HCC)   Adjustment disorder with mixed disturbance of emotions and conduct   Chronic depression   Hyperlipidemia, unspecified   Borderline intellectual disability   Malnutrition of moderate degree  #Acute Hypoxic Respiratory Failure  #Multifocal Pneumonia~Aspiration #Pulmonary Embolism #Pulmonary Edema & Pleural effusion #Mucus plugging Hx: COPD on baseline 4L [facility] Chest on 4/20: revealed PE in the right superior pulmonary artery, along with patchy airspace opacities bilaterally with consolidate changes in the LUL consistent with multifocal pneumonia Patient successfully extubated to heated high flow nasal cannula  on 5/1.  Regular sedation thus far.  Per palliative care if patient decompensates will transition to full comfort measures Plan: Completed course of antibiotic Wean stress dose steroids Continue hep gtt, no bleeding noted If patient bleeds may contact GI for endoscopy consideration Bronchodilators Wean oxygen as tolerated, currently on 4 L  #Hypotension: septic shock +/- sedation related #Septic shock #Acute decompensated HFmrEF Echocardiogram 09/02/23: LVEF 45-50%, mild LVH, indeterminate diastolic parameters, RV not well visualized Shock physiology has resolved.  Blood pressure has improved.  Lactic acid normalized.  Troponin negative x 2. Plan: .  Daily renal parameters.  #Acute blood loss anemia due to hemoptysis vs possible GI bleed ~improving  Hemoglobin stable but patient has had 2 episodes of bleeding after initiation of heparin  gtt. Plan: Continue hep gtt Trend CBC BID IV PPI Transfuse <7 If patient stabilizes and is able to tolerate p.o. will need to convert to DOAC at some point  #Type II diabetes mellitus  - CBG's q4h; Target range of 140 to 180 - SSI  #AKI~resolved  #Hypernatremia  #Hypophosphatemia~resolved   - Trend BMP  - Replace electrolytes as indicated ~ Pharmacy following for assistance with electrolyte replacement - Strict I&O's - Avoid nephrotoxic agents - Increased free water  flushes to 200 ml q6hrs     DVT prophylaxis: hep gtt Code Status: DNR Family Communication: DSS case workers x 2 Disposition Plan: Status is: Inpatient Remains inpatient appropriate because: Multiple acute issues as above   Level of care: Progressive  Consultants:  Palliative care  Procedures:  Bronchoscopy x 2  Antimicrobials: Cefepime  Vancomycin     Subjective: Seen and examined.  Mental status appears improved.  Endorses thirst this morning..  Objective: Vitals:   09/15/23 0900 09/15/23 1000 09/15/23 1055  09/15/23 1100  BP: 108/60 112/60  134/65  Pulse:  97 85 94 94  Resp: 15 12 13 13   Temp:      TempSrc:      SpO2: 96% 100% (!) 84% 91%  Weight:      Height:        Intake/Output Summary (Last 24 hours) at 09/15/2023 1201 Last data filed at 09/15/2023 1100 Gross per 24 hour  Intake 1792.35 ml  Output 1905 ml  Net -112.65 ml   Filed Weights   09/13/23 0408 09/14/23 0500 09/15/23 0400  Weight: 79.9 kg 78.3 kg 74.4 kg    Examination:  General exam: Awake chronically ill-appearing Respiratory system: Bibasilar crackles.  Normal work of breathing.  5 L Cardiovascular system: S1-S2, regular rate and rhythm, no murmurs, no pedal edema Gastrointestinal system: Soft, NT/ND, normal bowel sounds Central nervous system: Awake.  Oriented to person.   Extremities: Marked decreased power Skin: No rashes, lesions or ulcers Psychiatry: Judgment impaired.  Affect blunted.    Data Reviewed: I have personally reviewed following labs and imaging studies  CBC: Recent Labs  Lab 09/11/23 0307 09/12/23 0508 09/13/23 0234 09/14/23 0302 09/15/23 0302  WBC 13.2* 9.1 11.5* 8.8 10.6*  NEUTROABS  --  5.8 7.9*  --   --   HGB 8.4* 8.6* 9.6* 9.9* 10.9*  HCT 25.1* 26.3* 28.9* 29.6* 33.4*  MCV 96.2 100.0 96.0 95.8 96.5  PLT 155 171 236 247 295   Basic Metabolic Panel: Recent Labs  Lab 09/09/23 0309 09/10/23 0540 09/11/23 0307 09/12/23 0508 09/13/23 0234 09/15/23 0302  NA 145 140 148* 149* 146* 147*  K 4.8 5.0 4.6 4.0 3.6 3.2*  CL 104 104 108 109 107 99  CO2 32 33* 32 33* 33* 36*  GLUCOSE 267* 235* 172* 171* 150* 115*  BUN 63* 69* 84* 74* 69* 58*  CREATININE 1.30* 1.19 0.84 0.83 0.77 0.67  CALCIUM  9.0 8.6* 8.7* 8.3* 8.5* 8.6*  MG 2.7* 2.7* 2.7* 2.4 2.3  --   PHOS 3.4 2.4* 2.2* 3.4 3.4  --    GFR: Estimated Creatinine Clearance: 85.5 mL/min (by C-G formula based on SCr of 0.67 mg/dL). Liver Function Tests: Recent Labs  Lab 09/09/23 0309 09/10/23 0540  ALBUMIN  1.6* 2.2*   No results for input(s): "LIPASE", "AMYLASE" in the last  168 hours. Recent Labs  Lab 09/13/23 0234  AMMONIA 28   Coagulation Profile: No results for input(s): "INR", "PROTIME" in the last 168 hours. Cardiac Enzymes: No results for input(s): "CKTOTAL", "CKMB", "CKMBINDEX", "TROPONINI" in the last 168 hours. BNP (last 3 results) No results for input(s): "PROBNP" in the last 8760 hours. HbA1C: No results for input(s): "HGBA1C" in the last 72 hours. CBG: Recent Labs  Lab 09/14/23 1927 09/14/23 2328 09/15/23 0352 09/15/23 0716 09/15/23 1137  GLUCAP 156* 172* 155* 115* 217*   Lipid Profile: No results for input(s): "CHOL", "HDL", "LDLCALC", "TRIG", "CHOLHDL", "LDLDIRECT" in the last 72 hours. Thyroid Function Tests: No results for input(s): "TSH", "T4TOTAL", "FREET4", "T3FREE", "THYROIDAB" in the last 72 hours. Anemia Panel: No results for input(s): "VITAMINB12", "FOLATE", "FERRITIN", "TIBC", "IRON", "RETICCTPCT" in the last 72 hours. Sepsis Labs: No results for input(s): "PROCALCITON", "LATICACIDVEN" in the last 168 hours.   Recent Results (from the past 240 hours)  Culture, BAL-quantitative w Gram Stain     Status: Abnormal   Collection Time: 09/05/23  4:04 PM   Specimen: Bronchoalveolar Lavage; Respiratory  Result Value Ref Range Status   Specimen Description  Final    BRONCHIAL ALVEOLAR LAVAGE Performed at [facility], [address] [place], [place] [address]    Special Requests   Final    NONE Performed at [facility], [address] [place], [place] [address]    Gram Stain   Final    ABUNDANT WBC PRESENT, PREDOMINANTLY PMN NO ORGANISMS SEEN    Culture (A)  Final    30,000 COLONIES/mL Normal respiratory flora-no Staph aureus or Pseudomonas seen Performed at [facility], [address] [place], [place] [address]    Report Status 09/07/2023 FINAL  Final  Culture, BAL-quantitative w Gram Stain     Status: Abnormal   Collection Time: 09/07/23 12:37 PM   Specimen: Bronchoalveolar Lavage;  Respiratory  Result Value Ref Range Status   Specimen Description   Final    BRONCHIAL ALVEOLAR LAVAGE Performed at [facility], [address] [place], [place] [address]    Special Requests   Final    NONE Performed at [facility], [address] [place], [place] [address]    Gram Stain   Final    FEW WBC PRESENT, PREDOMINANTLY PMN NO ORGANISMS SEEN    Culture (A)  Final    20,000 COLONIES/mL Consistent with normal respiratory flora. No Pseudomonas species isolated Performed at [facility], [address] [place], [place] [address]    Report Status 09/10/2023 FINAL  Final  MRSA Next Gen by PCR, Nasal     Status: Abnormal   Collection Time: 09/09/23 10:52 AM   Specimen: Nasal Mucosa; Nasal Swab  Result Value Ref Range Status   MRSA by PCR Next Gen DETECTED (A) NOT DETECTED Final    Comment: RESULT CALLED TO, READ BACK BY AND VERIFIED WITH: JUAN RODRIGUEZ 1215 09/09/23 MU (NOTE) The GeneXpert MRSA Assay (FDA approved for NASAL specimens only), is one component of a comprehensive MRSA colonization surveillance program. It is not intended to diagnose MRSA infection nor to guide or monitor treatment for MRSA infections. Test performance is not FDA approved in patients less than 67 years old. Performed at [facility], [address], [place], [place] [address]          Radiology Studies: [facility] Chest [place] 1 View Result Date: 09/13/2023 CLINICAL DATA:  Shortness of breath. EXAM: PORTABLE CHEST 1 VIEW COMPARISON:  September 11, 2023. FINDINGS: Stable cardiomediastinal silhouette. Feeding tube is seen entering stomach. Stable bilateral reticulonodular opacities are noted, left greater than right, which may represent scarring or atypical inflammation. Bony thorax is unremarkable. IMPRESSION: Stable bilateral reticulonodular opacities are noted, left greater than right, which may represent scarring or atypical inflammation. Electronically Signed    By: Rosalene Colon M.D.   On: 09/13/2023 18:28        Scheduled Meds:  Chlorhexidine  Gluconate Cloth  6 each Topical Q1400   docusate  100 mg Per Tube BID   escitalopram   5 mg Per Tube QHS   feeding supplement (PROSource TF20)  60 mL Per Tube Daily   free water   200 mL Per Tube Q6H   [START ON 09/16/2023] furosemide   40 mg Intravenous Daily   gabapentin   100 mg Per Tube Q8H   hydrocortisone  sod succinate (SOLU-CORTEF ) inj  50 mg Intravenous Q6H   insulin  aspart  0-20 Units Subcutaneous Q4H   insulin  aspart  5 Units Subcutaneous Q4H   insulin  glargine-yfgn  15 Units Subcutaneous BID   ipratropium-albuterol   3 mL Nebulization TID  nystatin   5 mL Oral QID   mouth rinse  15 mL Mouth Rinse 4 times per day   pantoprazole  (PROTONIX ) IV  40 mg Intravenous Q12H   polyethylene glycol  17 g Per Tube Daily   QUEtiapine   150 mg Per Tube QHS   QUEtiapine   50 mg Per Tube 2 times per day   thiamine   100 mg Per Tube Daily   valproic  acid  500 mg Per Tube TID   Continuous Infusions:  feeding supplement (VITAL 1.5 CAL) 60 mL/hr at 09/15/23 1100   heparin  1,100 Units/hr (09/15/23 1100)     LOS: 15 days     Tiajuana Fluke, MD [facility]   If 7PM-7AM, please contact night-coverage  09/15/2023, 12:01 PM

## 2023-09-16 DIAGNOSIS — R6520 Severe sepsis without septic shock: Secondary | ICD-10-CM | POA: Diagnosis not present

## 2023-09-16 DIAGNOSIS — Z7189 Other specified counseling: Secondary | ICD-10-CM | POA: Diagnosis not present

## 2023-09-16 DIAGNOSIS — Z515 Encounter for palliative care: Secondary | ICD-10-CM | POA: Diagnosis not present

## 2023-09-16 DIAGNOSIS — A419 Sepsis, unspecified organism: Secondary | ICD-10-CM | POA: Diagnosis not present

## 2023-09-16 LAB — GLUCOSE, CAPILLARY
Glucose-Capillary: 105 mg/dL — ABNORMAL HIGH (ref 70–99)
Glucose-Capillary: 110 mg/dL — ABNORMAL HIGH (ref 70–99)
Glucose-Capillary: 113 mg/dL — ABNORMAL HIGH (ref 70–99)
Glucose-Capillary: 116 mg/dL — ABNORMAL HIGH (ref 70–99)
Glucose-Capillary: 120 mg/dL — ABNORMAL HIGH (ref 70–99)
Glucose-Capillary: 146 mg/dL — ABNORMAL HIGH (ref 70–99)

## 2023-09-16 LAB — HEPARIN LEVEL (UNFRACTIONATED): Heparin Unfractionated: 0.32 [IU]/mL (ref 0.30–0.70)

## 2023-09-16 LAB — CBC
HCT: 32.2 % — ABNORMAL LOW (ref 39.0–52.0)
Hemoglobin: 10.6 g/dL — ABNORMAL LOW (ref 13.0–17.0)
MCH: 32.4 pg (ref 26.0–34.0)
MCHC: 32.9 g/dL (ref 30.0–36.0)
MCV: 98.5 fL (ref 80.0–100.0)
Platelets: 304 10*3/uL (ref 150–400)
RBC: 3.27 MIL/uL — ABNORMAL LOW (ref 4.22–5.81)
RDW: 16.2 % — ABNORMAL HIGH (ref 11.5–15.5)
WBC: 12 10*3/uL — ABNORMAL HIGH (ref 4.0–10.5)
nRBC: 0.3 % — ABNORMAL HIGH (ref 0.0–0.2)

## 2023-09-16 LAB — BASIC METABOLIC PANEL WITH GFR
Anion gap: 9 (ref 5–15)
BUN: 61 mg/dL — ABNORMAL HIGH (ref 8–23)
CO2: 38 mmol/L — ABNORMAL HIGH (ref 22–32)
Calcium: 8.2 mg/dL — ABNORMAL LOW (ref 8.9–10.3)
Chloride: 99 mmol/L (ref 98–111)
Creatinine, Ser: 0.76 mg/dL (ref 0.61–1.24)
GFR, Estimated: >60 mL/min (ref >60)
Glucose, Bld: 145 mg/dL — ABNORMAL HIGH (ref 70–99)
Potassium: 3 mmol/L — ABNORMAL LOW (ref 3.5–5.1)
Sodium: 146 mmol/L — ABNORMAL HIGH (ref 135–145)

## 2023-09-16 MED ORDER — POTASSIUM CHLORIDE 20 MEQ PO PACK
40.0000 meq | PACK | Freq: Two times a day (BID) | ORAL | Status: AC
  Administered 2023-09-16 (×2): 40 meq
  Filled 2023-09-16 (×2): qty 2

## 2023-09-16 MED ORDER — VITAL 1.5 CAL PO LIQD
1000.0000 mL | ORAL | Status: DC
  Administered 2023-09-17: 1000 mL

## 2023-09-16 MED ORDER — SODIUM CHLORIDE 0.9 % IV BOLUS
500.0000 mL | Freq: Once | INTRAVENOUS | Status: AC
  Administered 2023-09-16: 500 mL via INTRAVENOUS

## 2023-09-16 MED ORDER — INSULIN GLARGINE-YFGN 100 UNIT/ML ~~LOC~~ SOLN
10.0000 [IU] | Freq: Two times a day (BID) | SUBCUTANEOUS | Status: DC
  Administered 2023-09-16: 10 [IU] via SUBCUTANEOUS
  Filled 2023-09-16 (×3): qty 0.1

## 2023-09-16 MED ORDER — PANTOPRAZOLE SODIUM 40 MG IV SOLR
40.0000 mg | INTRAVENOUS | Status: DC
Start: 2023-09-16 — End: 2023-09-19
  Administered 2023-09-16 – 2023-09-18 (×3): 40 mg via INTRAVENOUS
  Filled 2023-09-16 (×3): qty 10

## 2023-09-16 MED ORDER — HYDROCORTISONE SOD SUC (PF) 100 MG IJ SOLR
50.0000 mg | Freq: Two times a day (BID) | INTRAMUSCULAR | Status: DC
  Administered 2023-09-16 – 2023-09-17 (×2): 50 mg via INTRAVENOUS
  Filled 2023-09-16 (×2): qty 2

## 2023-09-16 MED ORDER — NEPRO/CARBSTEADY PO LIQD
237.0000 mL | Freq: Three times a day (TID) | ORAL | Status: DC
  Administered 2023-09-16 – 2023-09-18 (×5): 237 mL via ORAL

## 2023-09-16 NOTE — TOC Progression Note (Signed)
 Transition of Care [facility]) - Progression Note    Patient Details  Name: Paul Vaughan MRN: [id number] Date of Birth: August 25, 1955  Transition of Care [facility]) CM/SW Contact  Arminda Landmark, RN Phone Number: 09/16/2023, 4:24 PM  Clinical Narrative:    Met with pt in his ICU room. He's awake and able to speak but doesn't answer all questions. He came from a SNF and has a legal guardian. He has a feeding tube in the nare, not a peg tube. ST advanced his diet today to puree and thickened liquids. He most likely will DC back to SNF. TOC to continue to follow   Expected Discharge Plan: Skilled Nursing Facility Barriers to Discharge: Continued Medical Work up  Expected Discharge Plan and Services   Discharge Planning Services: CM Consult Post Acute Care Choice: Skilled Nursing Facility Living arrangements for the past 2 months: Skilled Nursing Facility                                       Social Determinants of Health (SDOH) Interventions SDOH Screenings   Food Insecurity: Patient Unable To Answer (08/31/2023)  Housing: Patient Unable To Answer (09/01/2023)  Transportation Needs: Patient Unable To Answer (08/31/2023)  Utilities: Patient Unable To Answer (08/31/2023)  Social Connections: Patient Unable To Answer (08/31/2023)  Tobacco Use: High Risk (08/31/2023)    Readmission Risk Interventions    07/01/2021    1:02 PM  Readmission Risk Prevention Plan  Transportation Screening Complete  PCP or Specialist Appt within 5-7 Days Complete  Home Care Screening Complete  Medication Review (RN CM) Complete

## 2023-09-16 NOTE — Progress Notes (Signed)
 Nutrition Follow Up Note   DOCUMENTATION CODES:   Non-severe (moderate) malnutrition in context of chronic illness  INTERVENTION:   Vital 1.5@60ml /hr continuous + ProSource TF 20- Give 60ml daily via tube  Free water  flushes 200ml q6 hours   Regimen provides 2240kcal/day, 117g/day protein and 1900ml/day of free water .   Daily weights   Nepro Shake po TID, each supplement provides 425 kcal and 19 grams protein  Magic cup TID with meals, each supplement provides 290 kcal and 9 grams of protein  NUTRITION DIAGNOSIS:   Moderate Malnutrition related to chronic illness as evidenced by moderate fat depletion, moderate muscle depletion. -ongoing   GOAL:   Patient will meet greater than or equal to 90% of their needs -met   MONITOR:   PO intake, Supplement acceptance, Diet advancement, Weight trends, Labs, TF tolerance, I & O's, Skin  ASSESSMENT:   68 year old male with history of non-insulin -dependent diabetes mellitus, HTN, depression, CKD, hyperlipidemia, neuropathy, intellectual learning difficulty/dementia, COPD and CHF who is admitted with PNA, sepsis, AKI and PE.  Visited pt's room today. Pt more awake and alert; pt repeatedly asking for water . Pt seen by SLP today and placed on a pureed diet. RN reports that patient did not eat more than a few bites at breakfast. NGT remains in place and pt is tolerating tube feeds at goal rate. Will plan to continue tube feeds until pt's oral intake improves. RD will add supplements to help pt meet his estimated needs. Pt remains at refeed risk. Pt with ongoing hypernatremia. Per chart, pt appears to be up ~12lbs since admission and is currently up ~7lbs from his UBW. Lasix  given today. Edema appears improved on exam.   Medications reviewed and include: colace, lasix , solu-cortef , insulin , protonix , miralax , KCl, thiamine , heparin   Labs reviewed: Na 146(H), K 3.0(L), BUN 61(H) P 3.4 wnl, Mg 2.3 wnl- 5/2 Wbc- 12.0(H), Hgb 10.6(L), Hct  32.2(L) Cbgs-  146, 120, 110 x 24 hrs   UOP-   Nutrition Focused Physical Exam:  Flowsheet Row Most Recent Value  Orbital Region Mild depletion  Upper Arm Region Moderate depletion  Thoracic and Lumbar Region Moderate depletion  Buccal Region Mild depletion  Temple Region Moderate depletion  Clavicle Bone Region Mild depletion  Clavicle and Acromion Bone Region Mild depletion  Scapular Bone Region Moderate depletion  Dorsal Hand Unable to assess  Patellar Region Moderate depletion  Anterior Thigh Region Moderate depletion  Posterior Calf Region Moderate depletion  Edema (RD Assessment) Mild  Hair Reviewed  Eyes Reviewed  Mouth Reviewed  Skin Reviewed  Nails Reviewed   Diet Order:   Diet Order             DIET - DYS 1 Room service appropriate? Yes; Fluid consistency: Nectar Thick  Diet effective now                  EDUCATION NEEDS:   No education needs have been identified at this time  Skin:  Skin Assessment: Reviewed RN Assessment (ecchymosis)  Last BM:  5/5- TYPE 7  Height:   Ht Readings from Last 1 Encounters:  09/09/23 5' 7.99" (1.727 m)    Weight:   Wt Readings from Last 1 Encounters:  09/16/23 80.2 kg    Ideal Body Weight:  70 kg  BMI:  Body mass index is 26.89 kg/m.  Estimated Nutritional Needs:   Kcal:  2000-2300kcal/day  Protein:  105-120g/day  Fluid:  1.8-2.1L/day  Torrance Freestone MS, RD, LDN If unable to  be reached, please send secure chat to "RD inpatient" available from 8:00a-4:00p daily

## 2023-09-16 NOTE — Progress Notes (Signed)
 0121 - Pt noted to be hypotensive. Dr. Achilles Holes notified. Awaiting response.   48 - Order received for IVF bolus. Administered per EMAR.    0200- BP improved s/p IVF bolus

## 2023-09-16 NOTE — Progress Notes (Signed)
 Palliative:  HPI: 68 year old male with history of non-insulin -dependent diabetes mellitus, hyperlipidemia, neuropathy, intellectual learning difficulty/dementia, COPD, on baseline 4 L nasal cannula, heart failure preserved ejection fraction, who presents emergency department for chief concerns of respiratory distress noticed by nursing staff requiring intubation (now successfully extubated). Hospitalization complicated by PE, mucus plugging s/p bronchoscopy, unable to tolerate heparin  due to bleeding, and now with ongoing poor intake/appetite.   I met today with Paul Vaughan. He is very lethargic. He is very weak. He nods head yes/no to simple questions but inconsistently. He does not follow further commands. I left him to rest. He denies pain and does not appear to have discomfort.   I called and had a conversation with DSS legal guardian Paul Vaughan. We reviewed my assessment and interaction with Paul Vaughan. We reviewed ongoing poor intake. We also reviewed progression up until now. Paul has had multiple conversations. We discussed ongoing concern for poor prognosis. I discussed with Paul that it is not unreasonable to consider comfort focused care. We discussed the importance of quality of life and Paul Vaughan's wishes. Unfortunately I have been unable to elicit insight from Paul Vaughan today. Paul has very good insightful questions. We discussed options of hospice at facility vs hospice facility. Ultimately we decided to decrease tube feeding to see if this will aide in any increase in appetite. I shared with Paul that I have not had good success with this in the past but certainly is something we can attempt. Paul would like for all attempts to be exhausted to allow for improvement before considering transition to comfort care. She is open to consideration of comfort and hospice if ongoing decline or lack of improvement. If he exhibits any signs of discomfort or suffering we will  reassess goals and plan of care.   All questions/concerns addressed to the best of my ability. Emotional support provided. Updated Dr. Mosetta Areola.   Exam: Sleeping but awakens to voice. Poor reserve. Weak and tired. Breathing regular, unlabored. Abd soft. Edema in extremities. Warm to touch. Nods head yes/no inconsistently.   Plan: - DNR/DNI in place - Decrease tube feeding with hopes this may increase appetite and desire for intake - Ongoing support and goals of care conversations  60 min  Vila Grayer, NP Palliative Medicine Team Pager [phone] (Please see [email or web address] for schedule) Team Phone [phone]

## 2023-09-16 NOTE — Consult Note (Signed)
 PHARMACY - ANTICOAGULATION CONSULT NOTE  Pharmacy Consult for IV Heparin  Indication: pulmonary embolus  Patient Measurements: Height: 5' 7.99" (172.7 cm) Weight: 80.2 kg (176 lb 12.9 oz) IBW/kg (Calculated) : 68.38 HEPARIN  DW (KG): 77  Labs: Recent Labs    09/14/23 0302 09/14/23 1111 09/15/23 0302 09/15/23 1812 09/16/23 0310  HGB 9.9*  --  10.9*  --  10.6*  HCT 29.6*  --  33.4*  --  32.2*  PLT 247  --  295  --  304  HEPARINUNFRC 0.28*   < > 0.31 0.37 0.32  CREATININE  --   --  0.67  --   --    < > = values in this interval not displayed.    Estimated Creatinine Clearance: 85.5 mL/min (by C-G formula based on SCr of 0.67 mg/dL).   Medical History: Past Medical History:  Diagnosis Date   CHF (congestive heart failure) (HCC)    Chronic kidney disease    COPD (chronic obstructive pulmonary disease) (HCC)    Dementia (HCC)    Medications:  Heparin  gtt 4/20 - 4/21 Apixaban  4/21 - 4/23 Heparin  gtt 4/24 - 4/24 >> stopped for bleeding with suctioning Heparin  gtt 4/28 - 4/29 >> stopped for drop in Hgb / concern for GIB  Assessment: 68 y/o M with medical history including non-insulin -dependent diabetes mellitus, hyperlipidemia, neuropathy, intellectual learning difficulty/dementia, COPD, on baseline 4 L nasal cannula, heart failure preserved ejection fraction with prolonged ICU admission secondary to respiratory failure in setting of pneumonia and PE. Patient has been on and off anticoagulation throughout admission for PE treatment but there has been concern for GIB as well. Plan is to re-trial anticoagulation for PE treatment.  Last INR on 09/01/23 was 1.3, baseline aPTT on 09/01/23 was 33.   Discussed with MD, will do no bolus heparin  protocol with goal heparin  level 0.3 - 0.5 given bleeding history.  Date Time HL Rate/Comment 5/2 1956 0.22 SUBtherapeutic x1 5/3 0302 0.28 Subtherapeutic 5/3 1956 0.30 Therapeutic x1 5/4 0302 0.31 Therapeutic x 2 5/4 1812 0.37 Therapeutic x  3 5/5 0310 0.32 Therapeutic x 4  Goal of Therapy:  Heparin  level 0.3 - 0.5 units/ml Monitor platelets by anticoagulation protocol: Yes   Plan:  ---Continue heparin  infusion rate at 1100 units/hour ---Will check HL in AM ---Monitor daily heparin  levels while on heparin  infusion ---Daily CBC per protocol while on IV heparin   Thank you for involving pharmacy in this patient's care.   Coretta Dexter, PharmD, [facility] 09/16/2023 5:51 AM

## 2023-09-16 NOTE — Plan of Care (Signed)
  Problem: Fluid Volume: Goal: Hemodynamic stability will improve Outcome: Progressing   Problem: Respiratory: Goal: Ability to maintain adequate ventilation will improve Outcome: Progressing   Problem: Clinical Measurements: Goal: Respiratory complications will improve Outcome: Progressing Goal: Cardiovascular complication will be avoided Outcome: Progressing   Problem: Nutrition: Goal: Adequate nutrition will be maintained Outcome: Progressing   Problem: Elimination: Goal: Will not experience complications related to bowel motility Outcome: Progressing Goal: Will not experience complications related to urinary retention Outcome: Progressing   Problem: Education: Goal: Knowledge of General Education information will improve Description: Including pain rating scale, medication(s)/side effects and non-pharmacologic comfort measures Outcome: Not Progressing   Problem: Health Behavior/Discharge Planning: Goal: Ability to manage health-related needs will improve Outcome: Not Progressing

## 2023-09-16 NOTE — Progress Notes (Signed)
 PROGRESS NOTE    Paul Vaughan  MWU:[id number] DOB: 09/12/1955 DOA: 08/31/2023 PCP: Meri Stammer, NP    Brief Narrative:  Paul Vaughan is a 68 year old male with history of non-insulin -dependent diabetes mellitus, hyperlipidemia, neuropathy, intellectual learning difficulty/dementia, COPD, on baseline 4 L nasal cannula, heart failure preserved ejection fraction, who presents emergency department for chief concerns of respiratory distress noticed by nursing staff.    Of note, he was recently admitted from 3/16 till 08/19/2023 when presented with generalized weakness, treated for pneumonia and rhabdo. Patient did had worsening respiratory status during that admission which was stabilizes and he was discharged on 4 L of oxygen to SNF.    5/2: Transferred to [facility] - Eat service.  Patient was successfully extubated to heated high flow nasal cannula on 5/1.  5/4: Oxygen status improving.  Weaned to 5 L nasal cannula   Assessment & Plan:   Principal Problem:   Severe sepsis with acute organ dysfunction (HCC) Active Problems:   Multifocal pneumonia   Acute on chronic hypoxic respiratory failure (HCC)   Acute pulmonary embolism (HCC)   AKI (acute kidney injury) (HCC)   Elevated LFTs   Essential (primary) hypertension   Chronic diastolic CHF (congestive heart failure) (HCC)   Adjustment disorder with mixed disturbance of emotions and conduct   Chronic depression   Hyperlipidemia, unspecified   Borderline intellectual disability   Malnutrition of moderate degree  #Acute Hypoxic Respiratory Failure  #Multifocal Pneumonia~Aspiration #Pulmonary Embolism #Pulmonary Edema & Pleural effusion #Mucus plugging Hx: COPD on baseline 4L [place] CTA Chest on 4/20: revealed PE in the right superior pulmonary artery, along with patchy airspace opacities bilaterally with consolidate changes in the LUL consistent with multifocal pneumonia Patient successfully extubated to heated high flow nasal cannula  on 5/1.  Regular sedation thus far.  Per palliative care if patient decompensates will transition to full comfort measures Plan: Completed course of antibiotic Continue weaning stress dose steroids.  Hydrocortisone  50 every 12 Continue hep gtt, no bleeding noted If patient bleeds may contact GI for endoscopy consideration Bronchodilators Wean oxygen as tolerated, currently on 4 L  #Hypotension: septic shock +/- sedation related #Septic shock #Acute decompensated HFmrEF Echocardiogram 09/02/23: LVEF 45-50%, mild LVH, indeterminate diastolic parameters, RV not well visualized Shock physiology has resolved.  Blood pressure has improved.  Lactic acid normalized.  Troponin negative x 2. Plan: Continue daily BMP  #Acute blood loss anemia due to hemoptysis vs possible GI bleed ~improving  Hemoglobin stable but patient has had 2 episodes of bleeding after initiation of heparin  gtt. Plan: Continue hep gtt Trend CBC BID IV PPI Transfuse <7 If patient stabilizes and is able to tolerate p.o. will need to convert to DOAC at some point  #Type II diabetes mellitus  - CBG's q4h; Target range of 140 to 180 - SSI  #AKI~resolved  #Hypernatremia  #Hypophosphatemia~resolved   - Trend BMP  - Replace electrolytes as indicated ~ Pharmacy following for assistance with electrolyte replacement - Strict I&O's - Avoid nephrotoxic agents - Increased free water  flushes to 200 ml q6hrs     DVT prophylaxis: hep gtt Code Status: DNR Family Communication: DSS case workers x 2 Disposition Plan: Status is: Inpatient Remains inpatient appropriate because: Multiple acute issues as above   Level of care: Progressive  Consultants:  Palliative care  Procedures:  Bronchoscopy x 2  Antimicrobials: Cefepime  Vancomycin     Subjective: Seen and examined.  Not especially communicative this morning.  Objective: Vitals:   09/16/23 0800 09/16/23 0900 09/16/23 1000  09/16/23 1100  BP: (!) 120/56 114/60  121/69 134/82  Pulse: 91 99 96 97  Resp: 15 13 15 13   Temp: 98.2 F (36.8 C)     TempSrc: Axillary     SpO2: 95% 94% 93% 96%  Weight:      Height:        Intake/Output Summary (Last 24 hours) at 09/16/2023 1129 Last data filed at 09/16/2023 0800 Gross per 24 hour  Intake 2149.89 ml  Output 870 ml  Net 1279.89 ml   Filed Weights   09/14/23 0500 09/15/23 0400 09/16/23 0426  Weight: 78.3 kg 74.4 kg 80.2 kg    Examination:  General exam: Frail and chronically ill Respiratory system: Crackles at bases.  Normal work of breathing.  4 L Cardiovascular system: S1-S2, regular rate and rhythm, no murmurs, no pedal edema Gastrointestinal system: Soft, NT/ND, normal bowel sounds Central nervous system: Awake.  Verbally unresponsive.  Unable to assess orientation Extremities: Marked decreased power Skin: No rashes, lesions or ulcers Psychiatry: Judgment impaired.  Affect blunted.    Data Reviewed: I have personally reviewed following labs and imaging studies  CBC: Recent Labs  Lab 09/12/23 0508 09/13/23 0234 09/14/23 0302 09/15/23 0302 09/16/23 0310  WBC 9.1 11.5* 8.8 10.6* 12.0*  NEUTROABS 5.8 7.9*  --   --   --   HGB 8.6* 9.6* 9.9* 10.9* 10.6*  HCT 26.3* 28.9* 29.6* 33.4* 32.2*  MCV 100.0 96.0 95.8 96.5 98.5  PLT 171 236 247 295 304   Basic Metabolic Panel: Recent Labs  Lab 09/10/23 0540 09/11/23 0307 09/12/23 0508 09/13/23 0234 09/15/23 0302 09/16/23 0831  NA 140 148* 149* 146* 147* 146*  K 5.0 4.6 4.0 3.6 3.2* 3.0*  CL 104 108 109 107 99 99  CO2 33* 32 33* 33* 36* 38*  GLUCOSE 235* 172* 171* 150* 115* 145*  BUN 69* 84* 74* 69* 58* 61*  CREATININE 1.19 0.84 0.83 0.77 0.67 0.76  CALCIUM  8.6* 8.7* 8.3* 8.5* 8.6* 8.2*  MG 2.7* 2.7* 2.4 2.3  --   --   PHOS 2.4* 2.2* 3.4 3.4  --   --    GFR: Estimated Creatinine Clearance: 85.5 mL/min (by C-G formula based on SCr of 0.76 mg/dL). Liver Function Tests: Recent Labs  Lab 09/10/23 0540  ALBUMIN  2.2*   No  results for input(s): "LIPASE", "AMYLASE" in the last 168 hours. Recent Labs  Lab 09/13/23 0234  AMMONIA 28   Coagulation Profile: No results for input(s): "INR", "PROTIME" in the last 168 hours. Cardiac Enzymes: No results for input(s): "CKTOTAL", "CKMB", "CKMBINDEX", "TROPONINI" in the last 168 hours. BNP (last 3 results) No results for input(s): "PROBNP" in the last 8760 hours. HbA1C: No results for input(s): "HGBA1C" in the last 72 hours. CBG: Recent Labs  Lab 09/15/23 1947 09/15/23 2029 09/15/23 2348 09/16/23 0343 09/16/23 0756  GLUCAP 87 83 105* 110* 120*   Lipid Profile: No results for input(s): "CHOL", "HDL", "LDLCALC", "TRIG", "CHOLHDL", "LDLDIRECT" in the last 72 hours. Thyroid Function Tests: No results for input(s): "TSH", "T4TOTAL", "FREET4", "T3FREE", "THYROIDAB" in the last 72 hours. Anemia Panel: No results for input(s): "VITAMINB12", "FOLATE", "FERRITIN", "TIBC", "IRON", "RETICCTPCT" in the last 72 hours. Sepsis Labs: No results for input(s): "PROCALCITON", "LATICACIDVEN" in the last 168 hours.   Recent Results (from the past 240 hours)  Culture, BAL-quantitative w Gram Stain     Status: Abnormal   Collection Time: 09/07/23 12:37 PM   Specimen: Bronchoalveolar Lavage; Respiratory  Result Value Ref Range  Status   Specimen Description   Final    BRONCHIAL ALVEOLAR LAVAGE Performed at [facility], [address] [place], [place] [address]    Special Requests   Final    NONE Performed at [facility], [address] [place], [place] [address]    Gram Stain   Final    FEW WBC PRESENT, PREDOMINANTLY PMN NO ORGANISMS SEEN    Culture (A)  Final    20,000 COLONIES/mL Consistent with normal respiratory flora. No Pseudomonas species isolated Performed at [facility], [address], [place], [place] [address]    Report Status 09/10/2023 FINAL  Final  MRSA Next Gen by PCR, Nasal     Status: Abnormal   Collection Time:  09/09/23 10:52 AM   Specimen: Nasal Mucosa; Nasal Swab  Result Value Ref Range Status   MRSA by PCR Next Gen DETECTED (A) NOT DETECTED Final    Comment: RESULT CALLED TO, READ BACK BY AND VERIFIED WITH: JUAN RODRIGUEZ 1215 09/09/23 MU (NOTE) The GeneXpert MRSA Assay (FDA approved for NASAL specimens only), is one component of a comprehensive MRSA colonization surveillance program. It is not intended to diagnose MRSA infection nor to guide or monitor treatment for MRSA infections. Test performance is not FDA approved in patients less than 55 years old. Performed at [facility], [address], [place], [place] [address]          Radiology Studies: No results found.       Scheduled Meds:  Chlorhexidine  Gluconate Cloth  6 each Topical Q1400   docusate  100 mg Per Tube BID   escitalopram   5 mg Per Tube QHS   feeding supplement (PROSource TF20)  60 mL Per Tube Daily   free water   200 mL Per Tube Q6H   furosemide   40 mg Intravenous Daily   gabapentin   100 mg Per Tube Q8H   hydrocortisone  sod succinate (SOLU-CORTEF ) inj  50 mg Intravenous Q8H   insulin  aspart  0-20 Units Subcutaneous Q4H   insulin  aspart  5 Units Subcutaneous Q4H   insulin  glargine-yfgn  15 Units Subcutaneous BID   ipratropium-albuterol   3 mL Nebulization TID   nystatin   5 mL Oral QID   mouth rinse  15 mL Mouth Rinse 4 times per day   pantoprazole  (PROTONIX ) IV  40 mg Intravenous Q24H   polyethylene glycol  17 g Per Tube Daily   potassium chloride   40 mEq Per Tube BID   QUEtiapine   150 mg Per Tube QHS   QUEtiapine   50 mg Per Tube 2 times per day   thiamine   100 mg Per Tube Daily   valproic  acid  500 mg Per Tube TID   Continuous Infusions:  feeding supplement (VITAL 1.5 CAL) 60 mL/hr at 09/16/23 0800   heparin  1,100 Units/hr (09/16/23 0800)     LOS: 16 days     Tiajuana Fluke, MD [facility]   If 7PM-7AM, please contact night-coverage  09/16/2023, 11:29 AM

## 2023-09-16 NOTE — Progress Notes (Signed)
 Speech Language Pathology Treatment: Dysphagia  Patient Details Name: Paul Vaughan MRN: [id number] DOB: [birth year] Today's Date: 09/16/2023 Time: 7829-5621 SLP Time Calculation (min) (ACUTE ONLY): 20 min  Assessment / Plan / Recommendation Clinical Impression  Pt seen for follow up dysphagia intervention targeting PO readiness. Pt with eyes open, though no verbalizations for duration of session. O2 requirements down to 4L, with O2 saturations maintained at greater than 94 for duration of trials. Total assist provided for completion of ice chip, nectar thick liquids, and puree solids. No attempt for self feeding when therapist attempted to hand cup to pt. No overt or subtle s/sx pharyngeal dysphagia noted. Vitals stable for duration of trials. Oral phase significantly prolonged- suspect related to altered mental status confounded by baseline impairments. Prolonged oral manipulation/transit noted with liquids and solids (leading to suspected delayed swallow initiation based on latency before overt swallow trigger). Alternating solids and liquids and increased time between trials aided oral clearance. Verbal redirection provided for sustained attention to task of eating.   Recommend initiation of puree and nectar thick liquids with aspiration precautions (slow rate, small bites, elevated HOB, and alert for PO intake). Total assist for feeding. MD and RN aware of recommendations. SLP will follow per POC.      HPI HPI: Paul Vaughan is a 68 year old male with history of non-insulin -dependent diabetes mellitus, hyperlipidemia, neuropathy, intellectual learning difficulty/dementia, COPD, on baseline 4 L nasal cannula, heart failure preserved ejection fraction, who presents emergency department on 08/31/2023 for chief concerns of respiratory distress noticed by nursing staff at [facility].      Of note, he was recently admitted from 3/16 till 08/19/2023 when presented with generalized weakness,  treated for pneumonia and rhabdo. Patient did had worsening respiratory status during that admission which was stabilizes and he was discharged on 4 L of oxygen to SNF ([facility]).    Pt intubated 09/04/2023 thru 09/12/2023 (time 1420). Per chart, if pt decompensates, will initiate comfort measures.     Pt known to ST services with BSE on 09/01/2023 recommending dysphagia 1 diet with necatr thick liquids, medicine crushed in puree (when accepting of it).      SLP Plan  Continue with current plan of care      Recommendations for follow up therapy are one component of a multi-disciplinary discharge planning process, led by the attending physician.  Recommendations may be updated based on patient status, additional functional criteria and insurance authorization.    Recommendations  Diet recommendations: Dysphagia 1 (puree);Nectar-thick liquid Liquids provided via: Teaspoon;Cup;No straw Medication Administration: Crushed with puree (vs via alt means) Supervision: Staff to assist with self feeding;Full supervision/cueing for compensatory strategies Compensations: Minimize environmental distractions;Slow rate;Small sips/bites;Lingual sweep for clearance of pocketing;Multiple dry swallows after each bite/sip;Follow solids with liquid Postural Changes and/or Swallow Maneuvers: Out of bed for meals;Seated upright 90 degrees;Upright 30-60 min after meal                  Oral care QID;Staff/trained caregiver to provide oral care   Frequent or constant Supervision/Assistance Dysphagia, oropharyngeal phase (R13.12)     Continue with current plan of care    [place] Kariann Wecker Clapp, MS, CCC-SLP Speech Language Pathologist Rehab Services; [facility] [phone] (ascom)   [place] J Clapp  09/16/2023, 11:54 AM

## 2023-09-17 DIAGNOSIS — R6520 Severe sepsis without septic shock: Secondary | ICD-10-CM | POA: Diagnosis not present

## 2023-09-17 DIAGNOSIS — A419 Sepsis, unspecified organism: Secondary | ICD-10-CM | POA: Diagnosis not present

## 2023-09-17 DIAGNOSIS — R627 Adult failure to thrive: Secondary | ICD-10-CM

## 2023-09-17 DIAGNOSIS — Z7189 Other specified counseling: Secondary | ICD-10-CM | POA: Diagnosis not present

## 2023-09-17 DIAGNOSIS — Z515 Encounter for palliative care: Secondary | ICD-10-CM | POA: Diagnosis not present

## 2023-09-17 LAB — PHOSPHORUS: Phosphorus: 3.1 mg/dL (ref 2.5–4.6)

## 2023-09-17 LAB — CBC
HCT: 26.9 % — ABNORMAL LOW (ref 39.0–52.0)
Hemoglobin: 9 g/dL — ABNORMAL LOW (ref 13.0–17.0)
MCH: 32.6 pg (ref 26.0–34.0)
MCHC: 33.5 g/dL (ref 30.0–36.0)
MCV: 97.5 fL (ref 80.0–100.0)
Platelets: 262 10*3/uL (ref 150–400)
RBC: 2.76 MIL/uL — ABNORMAL LOW (ref 4.22–5.81)
RDW: 16.5 % — ABNORMAL HIGH (ref 11.5–15.5)
WBC: 9.5 10*3/uL (ref 4.0–10.5)
nRBC: 0 % (ref 0.0–0.2)

## 2023-09-17 LAB — BASIC METABOLIC PANEL WITH GFR
Anion gap: 8 (ref 5–15)
BUN: 51 mg/dL — ABNORMAL HIGH (ref 8–23)
CO2: 35 mmol/L — ABNORMAL HIGH (ref 22–32)
Calcium: 8.3 mg/dL — ABNORMAL LOW (ref 8.9–10.3)
Chloride: 98 mmol/L (ref 98–111)
Creatinine, Ser: 0.68 mg/dL (ref 0.61–1.24)
GFR, Estimated: >60 mL/min (ref >60)
Glucose, Bld: 111 mg/dL — ABNORMAL HIGH (ref 70–99)
Potassium: 3.2 mmol/L — ABNORMAL LOW (ref 3.5–5.1)
Sodium: 141 mmol/L (ref 135–145)

## 2023-09-17 LAB — GLUCOSE, CAPILLARY
Glucose-Capillary: 39 mg/dL — CL (ref 70–99)
Glucose-Capillary: 49 mg/dL — ABNORMAL LOW (ref 70–99)
Glucose-Capillary: 56 mg/dL — ABNORMAL LOW (ref 70–99)
Glucose-Capillary: 95 mg/dL (ref 70–99)
Glucose-Capillary: 99 mg/dL (ref 70–99)
Glucose-Capillary: 140 mg/dL — ABNORMAL HIGH (ref 70–99)
Glucose-Capillary: 115 mg/dL — ABNORMAL HIGH (ref 70–99)
Glucose-Capillary: 123 mg/dL — ABNORMAL HIGH (ref 70–99)
Glucose-Capillary: 133 mg/dL — ABNORMAL HIGH (ref 70–99)

## 2023-09-17 LAB — HEPARIN LEVEL (UNFRACTIONATED)
Heparin Unfractionated: 0.28 [IU]/mL — ABNORMAL LOW (ref 0.30–0.70)
Heparin Unfractionated: 0.41 [IU]/mL (ref 0.30–0.70)
Heparin Unfractionated: 0.46 [IU]/mL (ref 0.30–0.70)

## 2023-09-17 LAB — MAGNESIUM: Magnesium: 2.2 mg/dL (ref 1.7–2.4)

## 2023-09-17 MED ORDER — LACTATED RINGERS IV BOLUS
500.0000 mL | Freq: Once | INTRAVENOUS | Status: AC
  Administered 2023-09-17: 500 mL via INTRAVENOUS

## 2023-09-17 MED ORDER — ADULT MULTIVITAMIN LIQUID CH
15.0000 mL | Freq: Every day | ORAL | Status: DC
  Administered 2023-09-18: 15 mL
  Filled 2023-09-17: qty 15

## 2023-09-17 MED ORDER — FREE WATER
200.0000 mL | Status: DC
  Administered 2023-09-17 – 2023-09-18 (×7): 200 mL

## 2023-09-17 MED ORDER — VITAL 1.5 CAL PO LIQD
980.0000 mL | ORAL | Status: DC
  Administered 2023-09-17 – 2023-09-18 (×2): 980 mL

## 2023-09-17 MED ORDER — HEPARIN BOLUS VIA INFUSION
1150.0000 [IU] | Freq: Once | INTRAVENOUS | Status: DC
  Filled 2023-09-17: qty 1150

## 2023-09-17 MED ORDER — DEXTROSE-SODIUM CHLORIDE 5-0.45 % IV SOLN: INTRAVENOUS | Status: DC

## 2023-09-17 MED ORDER — PROSOURCE TF20 ENFIT COMPATIBL EN LIQD
60.0000 mL | Freq: Three times a day (TID) | ENTERAL | Status: DC
Start: 2023-09-17 — End: 2023-09-18
  Administered 2023-09-17 – 2023-09-18 (×4): 60 mL
  Filled 2023-09-17 (×2): qty 60

## 2023-09-17 MED ORDER — DEXTROSE 50 % IV SOLN
INTRAVENOUS | Status: AC
  Filled 2023-09-17: qty 50

## 2023-09-17 NOTE — Progress Notes (Signed)
 1610 - Notified that pt's CBG was 49 @ 0342. D. White, RN administered 8oz juice via dobhoff per protocol. Enolia Hartmann, NP notified via secure chat by D. White, RN regarding CBG results.   0405 - Repeat CBG 56. E. Ouma notified and additional 8oz of juice administered via dobhoff by this RN per protocol. 0400 Insulin  held due to continued hypoglycemia - NP aware. Insulin  5U Q4H was discontinued be E. Ouma, NP.   [phone] - Repeat CBG 95. Provider notified. No new orders received.

## 2023-09-17 NOTE — Progress Notes (Signed)
 Nutrition Follow Up Note   DOCUMENTATION CODES:   Non-severe (moderate) malnutrition in context of chronic illness  INTERVENTION:   Change to nocturnal feeds of:  Vital 1.5@70ml /hr x 14 hrs overnight (from 1800-0800)  ProSource TF 20- Give 60ml TID via tube, each supplement provides 80kcal and 20g of protein.   Free water  flushes q4 hours   Regimen provides 1710kcal/day, 126g/day protein and 1951ml/day of free water .   Daily weights   Liquid MVI daily via tube   Nepro Shake po TID, each supplement provides 425 kcal and 19 grams protein  Magic cup TID with meals, each supplement provides 290 kcal and 9 grams of protein  NUTRITION DIAGNOSIS:   Moderate Malnutrition related to chronic illness as evidenced by moderate fat depletion, moderate muscle depletion. -ongoing   GOAL:   Patient will meet greater than or equal to 90% of their needs -met   MONITOR:   PO intake, Supplement acceptance, Diet advancement, Weight trends, Labs, TF tolerance, I & O's, Skin  ASSESSMENT:   68 year old male with history of non-insulin -dependent diabetes mellitus, HTN, depression, CKD, hyperlipidemia, neuropathy, intellectual learning difficulty/dementia, COPD and CHF who is admitted with PNA, sepsis, AKI and PE.  Visited pt's room today. NGT remains in place. Tube feeds decreased to half rate yesterday to try and stimulate pt's appetite and oral intake. Per RN report, pt eating only bites. RD will change patient over to nocturnal feeds to meet 85-100% of his estimated needs to allow for him to get hungry during the day. Pt with several episodes of hypoglycemia overnight; will need to monitor blood glucoses closely. Sodium level was not rechecked today; will continue free water  for now. Per chart, pt appears to be back at his UBW currently. Palliative following; family deciding about hospice if no improvement.   Medications reviewed and include: colace, insulin , protonix , miralax , KCl,  thiamine , heparin   Labs reviewed: Na 146(H), K 3.0(L), BUN 61(H)- 5/5 P 3.1 wnl, Mg 2.2 wnl Hgb 9.0(L), Hct 26.9(L) Cbgs-  99, 95, 56, 49, 39 x 24 hrs   UOP-   Diet Order:   Diet Order             DIET - DYS 1 Room service appropriate? Yes; Fluid consistency: Nectar Thick  Diet effective now                  EDUCATION NEEDS:   No education needs have been identified at this time  Skin:  Skin Assessment: Reviewed RN Assessment (ecchymosis)  Last BM:  5/6- type 7  Height:   Ht Readings from Last 1 Encounters:  09/09/23 5' 7.99" (1.727 m)    Weight:   Wt Readings from Last 1 Encounters:  09/17/23 77.3 kg    Ideal Body Weight:  70 kg  BMI:  Body mass index is 25.92 kg/m.  Estimated Nutritional Needs:   Kcal:  2000-2300kcal/day  Protein:  105-120g/day  Fluid:  1.8-2.1L/day  Paul Freestone MS, RD, LDN If unable to be reached, please send secure chat to "RD inpatient" available from 8:00a-4:00p daily

## 2023-09-17 NOTE — Consult Note (Signed)
 PHARMACY - ANTICOAGULATION CONSULT NOTE  Pharmacy Consult for IV Heparin  Indication: pulmonary embolus  Patient Measurements: Height: 5' 7.99" (172.7 cm) Weight: 77.3 kg (170 lb 6.7 oz) IBW/kg (Calculated) : 68.38 HEPARIN  DW (KG): 77  Labs: Recent Labs    09/15/23 0302 09/15/23 1812 09/16/23 0310 09/16/23 0831 09/17/23 0300 09/17/23 1231  HGB 10.9*  --  10.6*  --  9.0*  --   HCT 33.4*  --  32.2*  --  26.9*  --   PLT 295  --  304  --  262  --   HEPARINUNFRC 0.31   < > 0.32  --  0.28* 0.41  CREATININE 0.67  --   --  0.76  --  0.68   < > = values in this interval not displayed.    Estimated Creatinine Clearance: 85.5 mL/min (by C-G formula based on SCr of 0.68 mg/dL).   Medical History: Past Medical History:  Diagnosis Date   CHF (congestive heart failure) (HCC)    Chronic kidney disease    COPD (chronic obstructive pulmonary disease) (HCC)    Dementia (HCC)    Medications:  Heparin  gtt 4/20 - 4/21 Apixaban  4/21 - 4/23 Heparin  gtt 4/24 - 4/24 >> stopped for bleeding with suctioning Heparin  gtt 4/28 - 4/29 >> stopped for drop in Hgb / concern for GIB  Assessment: 68 y/o M with medical history including non-insulin -dependent diabetes mellitus, hyperlipidemia, neuropathy, intellectual learning difficulty/dementia, COPD, on baseline 4 L nasal cannula, heart failure preserved ejection fraction with prolonged ICU admission secondary to respiratory failure in setting of pneumonia and PE. Patient has been on and off anticoagulation throughout admission for PE treatment but there has been concern for GIB as well. Plan is to re-trial anticoagulation for PE treatment.  Last INR on 09/01/23 was 1.3, baseline aPTT on 09/01/23 was 33.   Discussed with MD, will do no bolus heparin  protocol with goal heparin  level 0.3 - 0.5 given bleeding history.  Date Time HL Rate/Comment 5/2 1956 0.22 SUBtherapeutic x1 5/3 0302 0.28 Subtherapeutic 5/3 1956 0.30 Therapeutic  x1 5/4 0302 0.31 Therapeutic x 2 5/4 1812 0.37 Therapeutic x 3 5/5 0310 0.32 Therapeutic x 4 5/6       0300    0.28     SUBtherapeutic 5/6 1231 0.41 Therapeutic x 1  Goal of Therapy:  Heparin  level 0.3 - 0.5 units/ml Monitor platelets by anticoagulation protocol: Yes   Plan:  --Continue heparin  infusion at 1200 units/hr --Re-check confirmatory HL in 6 hours --Daily CBC per protocol while on IV heparin   Thank you for involving pharmacy in this patient's care.   Page Boast 09/17/2023 1:49 PM

## 2023-09-17 NOTE — Plan of Care (Signed)
  Problem: Fluid Volume: Goal: Hemodynamic stability will improve Outcome: Progressing   Problem: Clinical Measurements: Goal: Signs and symptoms of infection will decrease Outcome: Progressing   Problem: Respiratory: Goal: Ability to maintain adequate ventilation will improve Outcome: Progressing   Problem: Clinical Measurements: Goal: Cardiovascular complication will be avoided Outcome: Progressing   Problem: Nutrition: Goal: Adequate nutrition will be maintained Outcome: Progressing   Problem: Elimination: Goal: Will not experience complications related to bowel motility Outcome: Progressing Goal: Will not experience complications related to urinary retention Outcome: Progressing

## 2023-09-17 NOTE — Consult Note (Signed)
 PHARMACY - ANTICOAGULATION CONSULT NOTE  Pharmacy Consult for IV Heparin  Indication: pulmonary embolus  Patient Measurements: Height: 5' 7.99" (172.7 cm) Weight: 77.3 kg (170 lb 6.7 oz) IBW/kg (Calculated) : 68.38 HEPARIN  DW (KG): 77  Labs: Recent Labs    09/15/23 0302 09/15/23 1812 09/16/23 0310 09/16/23 0831 09/17/23 0300  HGB 10.9*  --  10.6*  --  9.0*  HCT 33.4*  --  32.2*  --  26.9*  PLT 295  --  304  --  262  HEPARINUNFRC 0.31 0.37 0.32  --  0.28*  CREATININE 0.67  --   --  0.76  --     Estimated Creatinine Clearance: 85.5 mL/min (by C-G formula based on SCr of 0.76 mg/dL).   Medical History: Past Medical History:  Diagnosis Date   CHF (congestive heart failure) (HCC)    Chronic kidney disease    COPD (chronic obstructive pulmonary disease) (HCC)    Dementia (HCC)    Medications:  Heparin  gtt 4/20 - 4/21 Apixaban  4/21 - 4/23 Heparin  gtt 4/24 - 4/24 >> stopped for bleeding with suctioning Heparin  gtt 4/28 - 4/29 >> stopped for drop in Hgb / concern for GIB  Assessment: 68 y/o M with medical history including non-insulin -dependent diabetes mellitus, hyperlipidemia, neuropathy, intellectual learning difficulty/dementia, COPD, on baseline 4 L nasal cannula, heart failure preserved ejection fraction with prolonged ICU admission secondary to respiratory failure in setting of pneumonia and PE. Patient has been on and off anticoagulation throughout admission for PE treatment but there has been concern for GIB as well. Plan is to re-trial anticoagulation for PE treatment.  Last INR on 09/01/23 was 1.3, baseline aPTT on 09/01/23 was 33.   Discussed with MD, will do no bolus heparin  protocol with goal heparin  level 0.3 - 0.5 given bleeding history.  Date Time HL Rate/Comment 5/2 1956 0.22 SUBtherapeutic x1 5/3 0302 0.28 Subtherapeutic 5/3 1956 0.30 Therapeutic x1 5/4 0302 0.31 Therapeutic x 2 5/4 1812 0.37 Therapeutic x 3 5/5 0310 0.32 Therapeutic x 4 5/6       0300     0.28     SUBtherapeutic   Goal of Therapy:  Heparin  level 0.3 - 0.5 units/ml Monitor platelets by anticoagulation protocol: Yes   Plan:  5/6:  HL @ 0300 = 0.28,  SUBtherapeutic  - Will increase drip rate to 1200 units/hr and recheck HL 6 hrs after rate change. Thank you for involving pharmacy in this patient's care.   Eddye Broxterman D 09/17/2023 5:27 AM

## 2023-09-17 NOTE — Progress Notes (Signed)
 PROGRESS NOTE    Paul Vaughan  RUE:[id number] DOB: 20-Dec-1955 DOA: 08/31/2023 PCP: Meri Stammer, NP    Brief Narrative:  Paul Vaughan is a 68 year old male with history of non-insulin -dependent diabetes mellitus, hyperlipidemia, neuropathy, intellectual learning difficulty/dementia, COPD, on baseline 4 L nasal cannula, heart failure preserved ejection fraction, who presents emergency department for chief concerns of respiratory distress noticed by nursing staff.    Of note, he was recently admitted from 3/16 till 08/19/2023 when presented with generalized weakness, treated for pneumonia and rhabdo. Patient did had worsening respiratory status during that admission which was stabilizes and he was discharged on 4 L of oxygen to SNF.    5/2: Transferred to [facility] service.  Patient was successfully extubated to heated high flow nasal cannula on 5/1.  5/4: Oxygen status improving.  Weaned to 5 L nasal cannula 5/6: Hyperglycemia noted after tube feed rate was decreased   Assessment & Plan:   Principal Problem:   Severe sepsis with acute organ dysfunction (HCC) Active Problems:   Multifocal pneumonia   Acute on chronic hypoxic respiratory failure (HCC)   Acute pulmonary embolism (HCC)   AKI (acute kidney injury) (HCC)   Elevated LFTs   Essential (primary) hypertension   Chronic diastolic CHF (congestive heart failure) (HCC)   Adjustment disorder with mixed disturbance of emotions and conduct   Chronic depression   Hyperlipidemia, unspecified   Borderline intellectual disability   Malnutrition of moderate degree  #Acute Hypoxic Respiratory Failure  #Multifocal Pneumonia~Aspiration #Pulmonary Embolism #Pulmonary Edema & Pleural effusion #Mucus plugging Hx: COPD on baseline 4L [place] CTA Chest on 4/20: revealed PE in the right superior pulmonary artery, along with patchy airspace opacities bilaterally with consolidate changes in the LUL consistent with multifocal  pneumonia Patient successfully extubated to heated high flow nasal cannula on 5/1.  Regular sedation thus far.  Per palliative care if patient decompensates will transition to full comfort measures Plan: Completed course of antibiotic Discontinue stress dose steroids Continue hep gtt, no bleeding noted If patient bleeds may contact GI for endoscopy consideration Bronchodilators Wean oxygen as tolerated, currently on 4 L  #Hypotension: septic shock +/- sedation related #Septic shock #Acute decompensated HFmrEF Echocardiogram 09/02/23: LVEF 45-50%, mild LVH, indeterminate diastolic parameters, RV not well visualized Shock physiology has resolved.  Blood pressure has improved.  Lactic acid normalized.  Troponin negative x 2. Plan: Continue daily BMP  #Acute blood loss anemia due to hemoptysis vs possible GI bleed ~improving  Hemoglobin stable but patient has had 2 episodes of bleeding after initiation of heparin  gtt. Plan: Continue hep gtt Trend CBC BID IV PPI Transfuse <7 If patient stabilizes and is able to tolerate p.o. will need to convert to DOAC at some point  #Type II diabetes mellitus  Standing insulin  stopped due to hypoglycemia On low rates dextrose  containing fluids to avoid hypoglycemia  #AKI~resolved  #Hypernatremia  #Hypophosphatemia~resolved   - Trend BMP      DVT prophylaxis: hep gtt Code Status: DNR Family Communication: DSS case workers x 2 Disposition Plan: Status is: Inpatient Remains inpatient appropriate because: Multiple acute issues as above   Level of care: Progressive  Consultants:  Palliative care  Procedures:  Bronchoscopy x 2  Antimicrobials:   Subjective: Seen and examined.  Verbally unresponsive this morning  Objective: Vitals:   09/17/23 1200 09/17/23 1300 09/17/23 1306 09/17/23 1400  BP: (!) 143/73 129/87  137/71  Pulse: 96 98  88  Resp: 13 19  18   Temp: 98 F (36.7  C)     TempSrc: Axillary     SpO2: 96% 97% 98% 96%   Weight:      Height:        Intake/Output Summary (Last 24 hours) at 09/17/2023 1449 Last data filed at 09/17/2023 1400 Gross per 24 hour  Intake 2206.48 ml  Output 1450 ml  Net 756.48 ml   Filed Weights   09/15/23 0400 09/16/23 0426 09/17/23 0400  Weight: 74.4 kg 80.2 kg 77.3 kg    Examination:  General exam: Lethargic.  Frail and chronically ill Respiratory system: Crackles at bases.  Poor respiratory effort.  4 L Cardiovascular system: S1-S2, regular rate and rhythm, no murmurs, no pedal edema Gastrointestinal system: Soft, NT/ND, normal bowel sounds Central nervous system: Awake.  Verbally unresponsive.  Unable to assess orientation Extremities: Marked decreased power Skin: No rashes, lesions or ulcers Psychiatry: Judgment impaired.  Affect blunted.    Data Reviewed: I have personally reviewed following labs and imaging studies  CBC: Recent Labs  Lab 09/12/23 0508 09/13/23 0234 09/14/23 0302 09/15/23 0302 09/16/23 0310 09/17/23 0300  WBC 9.1 11.5* 8.8 10.6* 12.0* 9.5  NEUTROABS 5.8 7.9*  --   --   --   --   HGB 8.6* 9.6* 9.9* 10.9* 10.6* 9.0*  HCT 26.3* 28.9* 29.6* 33.4* 32.2* 26.9*  MCV 100.0 96.0 95.8 96.5 98.5 97.5  PLT 171 236 247 295 304 262   Basic Metabolic Panel: Recent Labs  Lab 09/11/23 0307 09/12/23 0508 09/13/23 0234 09/15/23 0302 09/16/23 0831 09/17/23 0300 09/17/23 1231  NA 148* 149* 146* 147* 146*  --  141  K 4.6 4.0 3.6 3.2* 3.0*  --  3.2*  CL 108 109 107 99 99  --  98  CO2 32 33* 33* 36* 38*  --  35*  GLUCOSE 172* 171* 150* 115* 145*  --  111*  BUN 84* 74* 69* 58* 61*  --  51*  CREATININE 0.84 0.83 0.77 0.67 0.76  --  0.68  CALCIUM  8.7* 8.3* 8.5* 8.6* 8.2*  --  8.3*  MG 2.7* 2.4 2.3  --   --  2.2  --   PHOS 2.2* 3.4 3.4  --   --  3.1  --    GFR: Estimated Creatinine Clearance: 85.5 mL/min (by C-G formula based on SCr of 0.68 mg/dL). Liver Function Tests: No results for input(s): "AST", "ALT", "ALKPHOS", "BILITOT", "PROT",  "ALBUMIN " in the last 168 hours.  No results for input(s): "LIPASE", "AMYLASE" in the last 168 hours. Recent Labs  Lab 09/13/23 0234  AMMONIA 28   Coagulation Profile: No results for input(s): "INR", "PROTIME" in the last 168 hours. Cardiac Enzymes: No results for input(s): "CKTOTAL", "CKMB", "CKMBINDEX", "TROPONINI" in the last 168 hours. BNP (last 3 results) No results for input(s): "PROBNP" in the last 8760 hours. HbA1C: No results for input(s): "HGBA1C" in the last 72 hours. CBG: Recent Labs  Lab 09/17/23 0342 09/17/23 0405 09/17/23 0436 09/17/23 0734 09/17/23 1124  GLUCAP 49* 56* 95 99 140*   Lipid Profile: No results for input(s): "CHOL", "HDL", "LDLCALC", "TRIG", "CHOLHDL", "LDLDIRECT" in the last 72 hours. Thyroid Function Tests: No results for input(s): "TSH", "T4TOTAL", "FREET4", "T3FREE", "THYROIDAB" in the last 72 hours. Anemia Panel: No results for input(s): "VITAMINB12", "FOLATE", "FERRITIN", "TIBC", "IRON", "RETICCTPCT" in the last 72 hours. Sepsis Labs: No results for input(s): "PROCALCITON", "LATICACIDVEN" in the last 168 hours.   Recent Results (from the past 240 hours)  MRSA Next Gen by PCR, Nasal  Status: Abnormal   Collection Time: 09/09/23 10:52 AM   Specimen: Nasal Mucosa; Nasal Swab  Result Value Ref Range Status   MRSA by PCR Next Gen DETECTED (A) NOT DETECTED Final    Comment: RESULT CALLED TO, READ BACK BY AND VERIFIED WITH: JUAN RODRIGUEZ 1215 09/09/23 MU (NOTE) The GeneXpert MRSA Assay (FDA approved for NASAL specimens only), is one component of a comprehensive MRSA colonization surveillance program. It is not intended to diagnose MRSA infection nor to guide or monitor treatment for MRSA infections. Test performance is not FDA approved in patients less than 31 years old. Performed at [facility], [address], [place], [place] [address]          Radiology Studies: No results found.       Scheduled Meds:   Chlorhexidine  Gluconate Cloth  6 each Topical Q1400   dextrose        docusate  100 mg Per Tube BID   escitalopram   5 mg Per Tube QHS   feeding supplement (NEPRO CARB STEADY)  237 mL Oral TID BM   feeding supplement (PROSource TF20)  60 mL Per Tube TID   feeding supplement (VITAL 1.5 CAL)  980 mL Per Tube Q24H   free water   200 mL Per Tube Q4H   gabapentin   100 mg Per Tube Q8H   insulin  aspart  0-20 Units Subcutaneous Q4H   insulin  glargine-yfgn  10 Units Subcutaneous BID   ipratropium-albuterol   3 mL Nebulization TID   [START ON 09/18/2023] multivitamin  15 mL Per Tube Daily   mouth rinse  15 mL Mouth Rinse 4 times per day   pantoprazole  (PROTONIX ) IV  40 mg Intravenous Q24H   polyethylene glycol  17 g Per Tube Daily   QUEtiapine   150 mg Per Tube QHS   QUEtiapine   50 mg Per Tube 2 times per day   valproic  acid  500 mg Per Tube TID   Continuous Infusions:  dextrose  5 % and 0.45 % NaCl 40 mL/hr at 09/17/23 1400   heparin  1,200 Units/hr (09/17/23 1400)     LOS: 17 days     Tiajuana Fluke, MD [facility]   If 7PM-7AM, please contact night-coverage  09/17/2023, 2:49 PM

## 2023-09-17 NOTE — Progress Notes (Signed)
 CBG at 0342 hours = 49 mg/dL; asymptomatic.  8 oz Juice given by this RN.  Recheck at 0405 hours = 56 mg/dL.  Patient's primary RN, Cathleen Coach, notified of the CBG.  Alonza Arthurs, NP notified of the event.

## 2023-09-17 NOTE — Progress Notes (Addendum)
 Palliative:  HPI: 67 year old male with history of non-insulin -dependent diabetes mellitus, hyperlipidemia, neuropathy, intellectual learning difficulty/dementia, COPD, on baseline 4 L nasal cannula, heart failure preserved ejection fraction, who presents emergency department for chief concerns of respiratory distress noticed by nursing staff requiring intubation (now successfully extubated). Hospitalization complicated by PE, mucus plugging s/p bronchoscopy, unable to tolerate heparin  due to bleeding, and now with ongoing poor intake/appetite - failure to thrive.   I met today with Paul Vaughan. He is sleepy and does not engage with me. He hears me and cuts his eyes open to look at me briefly. He does not speak to me or follow commands. Spoke with RN/NT who reports ~10% of meals which has been consistent - he takes a few bites and then wants no more. Dietician will change to nocturnal feeds. Discussed with Dr. Mosetta Areola and we will d/c semglee  to avoid hypoglycemia.   Will give more time to assess for progress before reaching out to DSS guardian again. I will reach out to DSS legal guardian again tomorrow to further discuss goals of care.   Exam: Sleeping but awakens to voice. Poor reserve. Weak and tired. Breathing regular, unlabored. Abd soft. Edema in extremities. Warm to touch.  Plan: - DNR/DNI - Change to nocturnal feeding - Ongoing GOC conversations - will reach out to legal guardian again tomorrow  25 min   Paul Grayer, NP Palliative Medicine Team Pager [phone] (Please see [email or web address] for schedule) Team Phone [phone]

## 2023-09-17 NOTE — Consult Note (Signed)
 PHARMACY - ANTICOAGULATION CONSULT NOTE  Pharmacy Consult for IV Heparin  Indication: pulmonary embolus  Patient Measurements: Height: 5' 7.99" (172.7 cm) Weight: 77.3 kg (170 lb 6.7 oz) IBW/kg (Calculated) : 68.38 HEPARIN  DW (KG): 77  Labs: Recent Labs    09/15/23 0302 09/15/23 1812 09/16/23 0310 09/16/23 0831 09/17/23 0300 09/17/23 1231 09/17/23 1926  HGB 10.9*  --  10.6*  --  9.0*  --   --   HCT 33.4*  --  32.2*  --  26.9*  --   --   PLT 295  --  304  --  262  --   --   HEPARINUNFRC 0.31   < > 0.32  --  0.28* 0.41 0.46  CREATININE 0.67  --   --  0.76  --  0.68  --    < > = values in this interval not displayed.    Estimated Creatinine Clearance: 85.5 mL/min (by C-G formula based on SCr of 0.68 mg/dL).   Medical History: Past Medical History:  Diagnosis Date   CHF (congestive heart failure) (HCC)    Chronic kidney disease    COPD (chronic obstructive pulmonary disease) (HCC)    Dementia (HCC)    Medications:  Heparin  gtt 4/20 - 4/21 Apixaban  4/21 - 4/23 Heparin  gtt 4/24 - 4/24 >> stopped for bleeding with suctioning Heparin  gtt 4/28 - 4/29 >> stopped for drop in Hgb / concern for GIB  Assessment: 68 y/o M with medical history including non-insulin -dependent diabetes mellitus, hyperlipidemia, neuropathy, intellectual learning difficulty/dementia, COPD, on baseline 4 L nasal cannula, heart failure preserved ejection fraction with prolonged ICU admission secondary to respiratory failure in setting of pneumonia and PE. Patient has been on and off anticoagulation throughout admission for PE treatment but there has been concern for GIB as well. Plan is to re-trial anticoagulation for PE treatment.  Last INR on 09/01/23 was 1.3, baseline aPTT on 09/01/23 was 33.   Discussed with MD, will do no bolus heparin  protocol with goal heparin  level 0.3 - 0.5 given bleeding history.  Date Time HL Rate/Comment 5/2 1956 0.22 SUBtherapeutic  x1 5/3 0302 0.28 Subtherapeutic 5/3 1956 0.30 Therapeutic x1 5/4 0302 0.31 Therapeutic x 2 5/4 1812 0.37 Therapeutic x 3 5/5 0310 0.32 Therapeutic x 4 5/6       0300    0.28     SUBtherapeutic 5/6 1231 0.41 Therapeutic x 1 5/6 1926 0.46 Therapeutic.   Goal of Therapy:  Heparin  level 0.3 - 0.5 units/ml Monitor platelets by anticoagulation protocol: Yes   Plan:  Heparin  level is therapeutic. Will continue heparin  infusion at 1200 units/hr. Recheck heparin  level and CBC with AM labs.   Thank you for involving pharmacy in this patient's care.   Trinidad Funk, PharmD, BCPS 09/17/2023 8:41 PM

## 2023-09-17 NOTE — Plan of Care (Signed)
  Problem: Fluid Volume: Goal: Hemodynamic stability will improve Outcome: Progressing   Problem: Respiratory: Goal: Ability to maintain adequate ventilation will improve Outcome: Progressing   Problem: Clinical Measurements: Goal: Respiratory complications will improve Outcome: Progressing Goal: Cardiovascular complication will be avoided Outcome: Progressing   Problem: Nutrition: Goal: Adequate nutrition will be maintained Outcome: Progressing   Problem: Elimination: Goal: Will not experience complications related to urinary retention Outcome: Progressing   Problem: Pain Managment: Goal: General experience of comfort will improve and/or be controlled Outcome: Progressing   Problem: Education: Goal: Knowledge of General Education information will improve Description: Including pain rating scale, medication(s)/side effects and non-pharmacologic comfort measures Outcome: Not Progressing   Problem: Health Behavior/Discharge Planning: Goal: Ability to manage health-related needs will improve Outcome: Not Progressing

## 2023-09-18 DIAGNOSIS — J189 Pneumonia, unspecified organism: Secondary | ICD-10-CM | POA: Diagnosis not present

## 2023-09-18 DIAGNOSIS — Z515 Encounter for palliative care: Secondary | ICD-10-CM | POA: Diagnosis not present

## 2023-09-18 DIAGNOSIS — R627 Adult failure to thrive: Secondary | ICD-10-CM | POA: Diagnosis not present

## 2023-09-18 DIAGNOSIS — A419 Sepsis, unspecified organism: Secondary | ICD-10-CM | POA: Diagnosis not present

## 2023-09-18 DIAGNOSIS — I2699 Other pulmonary embolism without acute cor pulmonale: Secondary | ICD-10-CM | POA: Diagnosis not present

## 2023-09-18 DIAGNOSIS — J9621 Acute and chronic respiratory failure with hypoxia: Secondary | ICD-10-CM | POA: Diagnosis not present

## 2023-09-18 DIAGNOSIS — Z7189 Other specified counseling: Secondary | ICD-10-CM | POA: Diagnosis not present

## 2023-09-18 LAB — HEPARIN LEVEL (UNFRACTIONATED): Heparin Unfractionated: 0.32 [IU]/mL (ref 0.30–0.70)

## 2023-09-18 LAB — CBC
HCT: 30.4 % — ABNORMAL LOW (ref 39.0–52.0)
Hemoglobin: 10.3 g/dL — ABNORMAL LOW (ref 13.0–17.0)
MCH: 32.9 pg (ref 26.0–34.0)
MCHC: 33.9 g/dL (ref 30.0–36.0)
MCV: 97.1 fL (ref 80.0–100.0)
Platelets: 285 10*3/uL (ref 150–400)
RBC: 3.13 MIL/uL — ABNORMAL LOW (ref 4.22–5.81)
RDW: 16.7 % — ABNORMAL HIGH (ref 11.5–15.5)
WBC: 11 10*3/uL — ABNORMAL HIGH (ref 4.0–10.5)
nRBC: 0.2 % (ref 0.0–0.2)

## 2023-09-18 LAB — BASIC METABOLIC PANEL WITH GFR
Anion gap: 9 (ref 5–15)
BUN: 48 mg/dL — ABNORMAL HIGH (ref 8–23)
CO2: 33 mmol/L — ABNORMAL HIGH (ref 22–32)
Calcium: 8.5 mg/dL — ABNORMAL LOW (ref 8.9–10.3)
Chloride: 100 mmol/L (ref 98–111)
Creatinine, Ser: 0.65 mg/dL (ref 0.61–1.24)
GFR, Estimated: >60 mL/min (ref >60)
Glucose, Bld: 96 mg/dL (ref 70–99)
Potassium: 2.9 mmol/L — ABNORMAL LOW (ref 3.5–5.1)
Sodium: 142 mmol/L (ref 135–145)

## 2023-09-18 LAB — GLUCOSE, CAPILLARY
Glucose-Capillary: 129 mg/dL — ABNORMAL HIGH (ref 70–99)
Glucose-Capillary: 99 mg/dL (ref 70–99)
Glucose-Capillary: 147 mg/dL — ABNORMAL HIGH (ref 70–99)
Glucose-Capillary: 95 mg/dL (ref 70–99)

## 2023-09-18 LAB — POTASSIUM: Potassium: 3.6 mmol/L (ref 3.5–5.1)

## 2023-09-18 MED ORDER — QUETIAPINE FUMARATE 25 MG PO TABS
50.0000 mg | ORAL_TABLET | ORAL | Status: DC
  Administered 2023-09-19 – 2023-09-23 (×8): 50 mg via ORAL
  Filled 2023-09-18 (×9): qty 2

## 2023-09-18 MED ORDER — MORPHINE SULFATE (CONCENTRATE) 10 MG /0.5 ML PO SOLN
5.0000 mg | ORAL | Status: DC | PRN
  Administered 2023-09-23: 5 mg via SUBLINGUAL
  Filled 2023-09-18: qty 0.5

## 2023-09-18 MED ORDER — ONDANSETRON HCL 4 MG/2ML IJ SOLN
4.0000 mg | Freq: Four times a day (QID) | INTRAMUSCULAR | Status: DC | PRN
Start: 2023-09-18 — End: 2023-09-24

## 2023-09-18 MED ORDER — HALOPERIDOL LACTATE 5 MG/ML IJ SOLN: 0.5000 mg | INTRAMUSCULAR | Status: DC | PRN

## 2023-09-18 MED ORDER — BIOTENE DRY MOUTH MT LIQD: 15.0000 mL | OROMUCOSAL | Status: DC | PRN

## 2023-09-18 MED ORDER — QUETIAPINE FUMARATE 25 MG PO TABS
150.0000 mg | ORAL_TABLET | Freq: Every day | ORAL | Status: DC
  Administered 2023-09-19 – 2023-09-23 (×4): 150 mg via ORAL
  Filled 2023-09-18 (×5): qty 6

## 2023-09-18 MED ORDER — MORPHINE SULFATE (PF) 2 MG/ML IV SOLN
2.0000 mg | INTRAVENOUS | Status: DC | PRN
  Administered 2023-09-23 (×4): 2 mg via INTRAVENOUS
  Filled 2023-09-18 (×4): qty 1

## 2023-09-18 MED ORDER — FREE WATER
100.0000 mL | Status: DC
  Administered 2023-09-18: 100 mL

## 2023-09-18 MED ORDER — POLYVINYL ALCOHOL 1.4 % OP SOLN: 1.0000 [drp] | Freq: Four times a day (QID) | OPHTHALMIC | Status: DC | PRN

## 2023-09-18 MED ORDER — ESCITALOPRAM OXALATE 10 MG PO TABS
5.0000 mg | ORAL_TABLET | Freq: Every day | ORAL | Status: DC
  Administered 2023-09-19 – 2023-09-22 (×4): 5 mg via ORAL
  Filled 2023-09-18 (×5): qty 1

## 2023-09-18 MED ORDER — VALPROIC ACID 250 MG/5ML PO SOLN
500.0000 mg | Freq: Three times a day (TID) | ORAL | Status: DC
  Administered 2023-09-19 – 2023-09-23 (×12): 500 mg via ORAL
  Filled 2023-09-18 (×16): qty 10

## 2023-09-18 MED ORDER — ONDANSETRON 4 MG PO TBDP: 4.0000 mg | ORAL_TABLET | Freq: Four times a day (QID) | ORAL | Status: DC | PRN

## 2023-09-18 MED ORDER — ACETAMINOPHEN 650 MG RE SUPP
650.0000 mg | Freq: Four times a day (QID) | RECTAL | Status: DC | PRN
  Administered 2023-09-22: 650 mg via RECTAL
  Filled 2023-09-18: qty 1

## 2023-09-18 MED ORDER — POTASSIUM CHLORIDE 10 MEQ/100ML IV SOLN
10.0000 meq | INTRAVENOUS | Status: AC
  Administered 2023-09-18 (×6): 10 meq via INTRAVENOUS
  Filled 2023-09-18 (×6): qty 100

## 2023-09-18 MED ORDER — GABAPENTIN 250 MG/5ML PO SOLN
100.0000 mg | Freq: Three times a day (TID) | ORAL | Status: DC
  Administered 2023-09-19 – 2023-09-23 (×11): 100 mg via ORAL
  Filled 2023-09-18 (×18): qty 2

## 2023-09-18 MED ORDER — HALOPERIDOL LACTATE 2 MG/ML PO CONC: 0.5000 mg | ORAL | Status: DC | PRN

## 2023-09-18 MED ORDER — VITAL 1.5 CAL PO LIQD: 980.0000 mL | ORAL | Status: DC

## 2023-09-18 NOTE — Progress Notes (Signed)
 PROGRESS NOTE    Paul Vaughan  JXB:[id number] DOB: [birth year] DOA: 08/31/2023 PCP: Meri Stammer, NP    Brief Narrative:  Paul Vaughan is a 68 year old male with history of non-insulin -dependent diabetes mellitus, hyperlipidemia, neuropathy, intellectual learning difficulty/dementia, COPD, on baseline 4 L nasal cannula, heart failure preserved ejection fraction, who presents emergency department for chief concerns of respiratory distress noticed by nursing staff.    Of note, he was recently admitted from 3/16 till 08/19/2023 when presented with generalized weakness, treated for pneumonia and rhabdo. Patient did had worsening respiratory status during that admission which was stabilizes and he was discharged on 4 L of oxygen to SNF.    5/2: Transferred to [facility] service.  Patient was successfully extubated to heated high flow nasal cannula on 5/1.  5/4: Oxygen status improving.  Weaned to 5 L nasal cannula 5/6: Hyperglycemia noted after tube feed rate was decreased 5/7: Palliative care consult.  Consideration for comfort care/hospice depending on discussion with legal guardian.  DC telemetry, transfer to MedSurg  Assessment & Plan:   Principal Problem:   Severe sepsis with acute organ dysfunction [facility]) Active Problems:   Multifocal pneumonia   Acute on chronic hypoxic respiratory failure (HCC)   Acute pulmonary embolism (HCC)   AKI (acute kidney injury) (HCC)   Elevated LFTs   Essential (primary) hypertension   Chronic diastolic CHF (congestive heart failure) (HCC)   Adjustment disorder with mixed disturbance of emotions and conduct   Chronic depression   Hyperlipidemia, unspecified   Borderline intellectual disability   Malnutrition of moderate degree  #Acute Hypoxic Respiratory Failure  #Multifocal Pneumonia~Aspiration #Pulmonary Embolism #Pulmonary Edema & Pleural effusion #Mucus plugging Hx: COPD on baseline 4L [place] CTA Chest on 4/20: revealed PE in the right  superior pulmonary artery, along with patchy airspace opacities bilaterally with consolidate changes in the LUL consistent with multifocal pneumonia Patient successfully extubated to heated high flow nasal cannula on 5/1.  Regular sedation thus far.  Per palliative care if patient decompensates will transition to full comfort measures.  They are waiting to have discussion with legal guardian Plan: Completed course of antibiotic Discontinue stress dose steroids Continue hep gtt, no bleeding noted If patient bleeds may contact GI for endoscopy consideration Bronchodilators Wean oxygen as tolerated, currently on 3 L  #Hypotension: septic shock +/- sedation related #Septic shock #Acute decompensated HFmrEF Echocardiogram 09/02/23: LVEF 45-50%, mild LVH, indeterminate diastolic parameters, RV not well visualized Shock physiology has resolved.  Blood pressure has improved.  Lactic acid normalized.  Troponin negative x 2. Plan: Monitor BMP  #Acute blood loss anemia due to hemoptysis vs possible GI bleed ~improving  Hemoglobin stable but patient has had 2 episodes of bleeding after initiation of heparin  gtt. Plan: Continue hep gtt Trend CBC BID IV PPI Transfuse <7 If patient stabilizes and is able to tolerate p.o. will need to convert to DOAC at some point  #Type II diabetes mellitus  Standing insulin  stopped due to hypoglycemia On low rates dextrose  containing fluids to avoid hypoglycemia  #AKI~resolved  #Hypernatremia  #Hypophosphatemia~resolved   - Trend BMP   Nutrition Speech therapy evaluated and recommends dysphagia 1 with nectar thick liquids -He displays increased risk for aspiration mainly due to his mentation/lack of alertness is limiting his consumption of oral nutrition    DVT prophylaxis: hep gtt Code Status: DNR Family Communication: Left message for legal guardian today Disposition Plan: Status is: Inpatient Remains inpatient appropriate because: Multiple acute  issues as above   Level of  care: Med-Surg  Consultants:  Palliative care  Procedures:  Bronchoscopy x 2  Antimicrobials:   Subjective: Seen and examined.  No new issues, awake but falls back to sleep easily  Objective: Vitals:   09/18/23 1200 09/18/23 1300 09/18/23 1321 09/18/23 1415  BP: 137/70 (!) 120/58  131/62  Pulse: (!) 103 (!) 106  (!) 110  Resp: 15 20  20   Temp: 98 F (36.7 C)   98.2 F (36.8 C)  TempSrc: Axillary     SpO2: 93% 95% 98% 97%  Weight:      Height:        Intake/Output Summary (Last 24 hours) at 09/18/2023 1654 Last data filed at 09/18/2023 1330 Gross per 24 hour  Intake 9387.98 ml  Output 1300 ml  Net 8087.98 ml   Filed Weights   09/16/23 0426 09/17/23 0400 09/18/23 0323  Weight: 80.2 kg 77.3 kg 79.2 kg    Examination:  General exam: Lethargic.  Frail and chronically ill Respiratory system: Crackles at bases.  Poor respiratory effort.  3 L Cardiovascular system: S1-S2, regular rate and rhythm, no murmurs, no pedal edema Gastrointestinal system: Soft, NT/ND, normal bowel sounds Central nervous system: Awake.  Alert unable to assess orientation Extremities: Marked decreased power Skin: No rashes, lesions or ulcers Psychiatry: Judgment impaired.  Affect blunted.    Data Reviewed: I have personally reviewed following labs and imaging studies  CBC: Recent Labs  Lab 09/12/23 0508 09/13/23 0234 09/14/23 0302 09/15/23 0302 09/16/23 0310 09/17/23 0300 09/18/23 0518  WBC 9.1 11.5* 8.8 10.6* 12.0* 9.5 11.0*  NEUTROABS 5.8 7.9*  --   --   --   --   --   HGB 8.6* 9.6* 9.9* 10.9* 10.6* 9.0* 10.3*  HCT 26.3* 28.9* 29.6* 33.4* 32.2* 26.9* 30.4*  MCV 100.0 96.0 95.8 96.5 98.5 97.5 97.1  PLT 171 236 247 295 304 262 285   Basic Metabolic Panel: Recent Labs  Lab 09/12/23 0508 09/13/23 0234 09/15/23 0302 09/16/23 0831 09/17/23 0300 09/17/23 1231 09/18/23 0518 09/18/23 1423  NA 149* 146* 147* 146*  --  141 142  --   K 4.0 3.6 3.2*  3.0*  --  3.2* 2.9* 3.6  CL 109 107 99 99  --  98 100  --   CO2 33* 33* 36* 38*  --  35* 33*  --   GLUCOSE 171* 150* 115* 145*  --  111* 96  --   BUN 74* 69* 58* 61*  --  51* 48*  --   CREATININE 0.83 0.77 0.67 0.76  --  0.68 0.65  --   CALCIUM  8.3* 8.5* 8.6* 8.2*  --  8.3* 8.5*  --   MG 2.4 2.3  --   --  2.2  --   --   --   PHOS 3.4 3.4  --   --  3.1  --   --   --    GFR: Estimated Creatinine Clearance: 85.5 mL/min (by C-G formula based on SCr of 0.65 mg/dL). Liver Function Tests: No results for input(s): "AST", "ALT", "ALKPHOS", "BILITOT", "PROT", "ALBUMIN " in the last 168 hours.  No results for input(s): "LIPASE", "AMYLASE" in the last 168 hours. Recent Labs  Lab 09/13/23 0234  AMMONIA 28    CBG: Recent Labs  Lab 09/17/23 2323 09/18/23 0317 09/18/23 0719 09/18/23 1106 09/18/23 1602  GLUCAP 133* 129* 99 147* 95      Recent Results (from the past 240 hours)  MRSA Next Gen by PCR, Nasal  Status: Abnormal   Collection Time: 09/09/23 10:52 AM   Specimen: Nasal Mucosa; Nasal Swab  Result Value Ref Range Status   MRSA by PCR Next Gen DETECTED (A) NOT DETECTED Final    Comment: RESULT CALLED TO, READ BACK BY AND VERIFIED WITH: JUAN RODRIGUEZ 1215 09/09/23 MU (NOTE) The GeneXpert MRSA Assay (FDA approved for NASAL specimens only), is one component of a comprehensive MRSA colonization surveillance program. It is not intended to diagnose MRSA infection nor to guide or monitor treatment for MRSA infections. Test performance is not FDA approved in patients less than 77 years old. Performed at [facility], [address], [place], [place] [address]          Radiology Studies: No results found.       Scheduled Meds:  Chlorhexidine  Gluconate Cloth  6 each Topical Q1400   docusate  100 mg Per Tube BID   escitalopram   5 mg Per Tube QHS   feeding supplement (NEPRO CARB STEADY)  237 mL Oral TID BM   feeding supplement (PROSource TF20)  60 mL Per Tube  TID   feeding supplement (VITAL 1.5 CAL)  980 mL Per Tube Q24H   free water   100 mL Per Tube Q4H   gabapentin   100 mg Per Tube Q8H   insulin  aspart  0-20 Units Subcutaneous Q4H   ipratropium-albuterol   3 mL Nebulization TID   multivitamin  15 mL Per Tube Daily   mouth rinse  15 mL Mouth Rinse 4 times per day   pantoprazole  (PROTONIX ) IV  40 mg Intravenous Q24H   polyethylene glycol  17 g Per Tube Daily   QUEtiapine   150 mg Per Tube QHS   QUEtiapine   50 mg Per Tube 2 times per day   valproic  acid  500 mg Per Tube TID   Continuous Infusions:  heparin  1,200 Units/hr (09/18/23 1418)     LOS: 18 days   Time spent 35 minutes  Brenna Cam, MD [facility]   If 7PM-7AM, please contact night-coverage  09/18/2023, 4:54 PM

## 2023-09-18 NOTE — Plan of Care (Signed)
  Problem: Fluid Volume: Goal: Hemodynamic stability will improve Outcome: Not Applicable   Problem: Clinical Measurements: Goal: Diagnostic test results will improve Outcome: Not Applicable Goal: Signs and symptoms of infection will decrease Outcome: Not Applicable   Problem: Respiratory: Goal: Ability to maintain adequate ventilation will improve Outcome: Completed/Met   Problem: Education: Goal: Knowledge of General Education information will improve Description: Including pain rating scale, medication(s)/side effects and non-pharmacologic comfort measures Outcome: Not Progressing   Problem: Health Behavior/Discharge Planning: Goal: Ability to manage health-related needs will improve Outcome: Not Applicable   Problem: Clinical Measurements: Goal: Ability to maintain clinical measurements within normal limits will improve Outcome: Completed/Met Goal: Will remain free from infection Outcome: Progressing Goal: Diagnostic test results will improve Outcome: Not Applicable Goal: Respiratory complications will improve Outcome: Progressing Goal: Cardiovascular complication will be avoided Outcome: Progressing

## 2023-09-18 NOTE — Progress Notes (Signed)
 Speech Language Pathology Treatment: Dysphagia  Patient Details Name: Paul Vaughan MRN: [id number] DOB: 03/16/1956 Today's Date: 09/18/2023 Time: 4098-1191 SLP Time Calculation (min) (ACUTE ONLY): 13 min  Assessment / Plan / Recommendation Clinical Impression  Pt was initially sleeping when this writer entered room. With verbal cues, he was able to arouse was agreeable to consuming ice chips. Pt was alert during initial spoonful of ice chips with lengthy oral phase observed and increased throat clearing as well as wet vocal quality. Suspect increased s/s of potential aspiration are related to possible delayed swallow initiation and inattention to bolus as he required increased cues as his manipulated the first spoonful. His alertness was fleeting measured in seconds not minutes. He was able attempt a cough when cued but this didn't appear productive as his wetness continued to increase as the spoonful of ice chips was still being orally manipulated.   Pt was not able to maintain alertness for trials of thin liquids. At this time, continue currently prescribed diet of dysphagia 1 with nectar thick liquids, medicine crushed in puree with general aspiration precautions. Pt's diet modifications are not inhibiting his ability to increase PO consumption and his continued altered state prevent him from participating in skilled ST services. As such, ST services will sign off.   This information was provided vis secure chat to pt's treatment team.    HPI HPI: Makih Severson is a 68 year old male with history of non-insulin -dependent diabetes mellitus, hyperlipidemia, neuropathy, intellectual learning difficulty/dementia, COPD, on baseline 4 L nasal cannula, heart failure preserved ejection fraction, who presents emergency department on 08/31/2023 for chief concerns of respiratory distress noticed by nursing staff at [facility].      Of note, he was recently admitted from 3/16 till 08/19/2023 when  presented with generalized weakness, treated for pneumonia and rhabdo. Patient did had worsening respiratory status during that admission which was stabilizes and he was discharged on 4 L of oxygen to SNF ([facility]).    Pt intubated 09/04/2023 thru 09/12/2023 (time 1420). Per chart, if pt decompensates, will initiate comfort measures.     Pt known to ST services with BSE on 09/01/2023 recommending dysphagia 1 diet with necatr thick liquids, medicine crushed in puree (when accepting of it).      SLP Plan  Discharge SLP treatment due to (comment) (limited ability to participate in skilled ST sessions d/t lethargy)      Recommendations for follow up therapy are one component of a multi-disciplinary discharge planning process, led by the attending physician.  Recommendations may be updated based on patient status, additional functional criteria and insurance authorization.    Recommendations  Diet recommendations: Dysphagia 1 (puree);Nectar-thick liquid Liquids provided via: Cup Medication Administration: Crushed with puree Supervision: Staff to assist with self feeding;Full supervision/cueing for compensatory strategies Compensations: Minimize environmental distractions;Slow rate;Small sips/bites Postural Changes and/or Swallow Maneuvers: Seated upright 90 degrees                  Oral care QID;Staff/trained caregiver to provide oral care   Frequent or constant Supervision/Assistance Dysphagia, oropharyngeal phase (R13.12)     Discharge SLP treatment due to (comment) (limited ability to participate in skilled ST sessions d/t lethargy)   Chieko Neises B. Garlin Junker, M.S., CCC-SLP, Tree surgeon Certified Brain Injury Specialist [facility]  [facility] Office [phone] Ascom [phone] Fax [phone]

## 2023-09-18 NOTE — Progress Notes (Signed)
 PHARMACY CONSULT NOTE - FOLLOW UP  Pharmacy Consult for Electrolyte Monitoring and Replacement   Recent Labs: Potassium (mmol/L)  Date Value  09/18/2023 2.9 (L)   Magnesium (mg/dL)  Date Value  16/02/9603 2.2   Calcium  (mg/dL)  Date Value  54/01/8118 8.5 (L)   Albumin  (g/dL)  Date Value  14/78/2956 2.2 (L)   Phosphorus (mg/dL)  Date Value  21/30/8657 3.1   Sodium (mmol/L)  Date Value  09/18/2023 142     Assessment: 5/7: K @ 0518 = 2.9  Goal of Therapy:  Electrolytes WNL   Plan:  KCl 10 mEq IV X 6 ordered to start on 5/7 @ 0700. - will recheck electrolytes @ 1400   Miyoshi Ligas D ,PharmD Clinical Pharmacist 09/18/2023 6:26 AM

## 2023-09-18 NOTE — Progress Notes (Signed)
 PHARMACY CONSULT NOTE - FOLLOW UP  Pharmacy Consult for Electrolyte Monitoring and Replacement   Recent Labs: Potassium (mmol/L)  Date Value  09/18/2023 3.6   Magnesium (mg/dL)  Date Value  02/72/5366 2.2   Calcium  (mg/dL)  Date Value  44/07/4740 8.5 (L)   Albumin  (g/dL)  Date Value  59/56/3875 2.2 (L)   Phosphorus (mg/dL)  Date Value  64/33/2951 3.1   Sodium (mmol/L)  Date Value  09/18/2023 142      Assessment: 68 year old male with history of non-insulin -dependent diabetes mellitus, hyperlipidemia, neuropathy, intellectual learning difficulty/dementia, COPD, on baseline 4 L nasal cannula, heart failure preserved ejection fraction. Admitted 08/31/23 Extubated 5/1 Transfer to floor 5/7   Goal of Therapy:  Electrolytes WNL   Plan:  5/7: K @ 1423 = 3.6 No electrolyte replacement at this time Follow up electrolytes in am  Scotty Cyphers ,PharmD Clinical Pharmacist 09/18/2023 2:54 PM

## 2023-09-18 NOTE — Consult Note (Signed)
 PHARMACY - ANTICOAGULATION CONSULT NOTE  Pharmacy Consult for IV Heparin  Indication: pulmonary embolus  Patient Measurements: Height: 5' 7.99" (172.7 cm) Weight: 79.2 kg (174 lb 9.7 oz) IBW/kg (Calculated) : 68.38 HEPARIN  DW (KG): 77  Labs: Recent Labs    09/16/23 0310 09/16/23 0831 09/17/23 0300 09/17/23 1231 09/17/23 1926 09/18/23 0518  HGB 10.6*  --  9.0*  --   --  10.3*  HCT 32.2*  --  26.9*  --   --  30.4*  PLT 304  --  262  --   --  285  HEPARINUNFRC 0.32  --  0.28* 0.41 0.46 0.32  CREATININE  --  0.76  --  0.68  --  0.65    Estimated Creatinine Clearance: 85.5 mL/min (by C-G formula based on SCr of 0.65 mg/dL).   Medical History: Past Medical History:  Diagnosis Date   CHF (congestive heart failure) (HCC)    Chronic kidney disease    COPD (chronic obstructive pulmonary disease) (HCC)    Dementia (HCC)    Medications:  Heparin  gtt 4/20 - 4/21 Apixaban  4/21 - 4/23 Heparin  gtt 4/24 - 4/24 >> stopped for bleeding with suctioning Heparin  gtt 4/28 - 4/29 >> stopped for drop in Hgb / concern for GIB  Assessment: 68 y/o M with medical history including non-insulin -dependent diabetes mellitus, hyperlipidemia, neuropathy, intellectual learning difficulty/dementia, COPD, on baseline 4 L nasal cannula, heart failure preserved ejection fraction with prolonged ICU admission secondary to respiratory failure in setting of pneumonia and PE. Patient has been on and off anticoagulation throughout admission for PE treatment but there has been concern for GIB as well. Plan is to re-trial anticoagulation for PE treatment.  Last INR on 09/01/23 was 1.3, baseline aPTT on 09/01/23 was 33.   Discussed with MD, will do no bolus heparin  protocol with goal heparin  level 0.3 - 0.5 given bleeding history.  Date Time HL Rate/Comment 5/2 1956 0.22 SUBtherapeutic x1 5/3 0302 0.28 Subtherapeutic 5/3 1956 0.30 Therapeutic x1 5/4 0302 0.31 Therapeutic x 2 5/4 1812 0.37 Therapeutic x  3 5/5 0310 0.32 Therapeutic x 4 5/6       0300    0.28     SUBtherapeutic 5/6 1231 0.41 Therapeutic x 1 5/6 1926 0.46 Therapeutic.  5/7       0518    0.32    Therapeutic X 3   Goal of Therapy:  Heparin  level 0.3 - 0.5 units/ml Monitor platelets by anticoagulation protocol: Yes   Plan:  Heparin  level is therapeutic. Will continue heparin  infusion at 1200 units/hr. Recheck heparin  level and CBC with AM labs.   Thank you for involving pharmacy in this patient's care.   Ry Moody D, PharmD 09/18/2023 6:37 AM

## 2023-09-18 NOTE — Plan of Care (Signed)
  Problem: Clinical Measurements: Goal: Diagnostic test results will improve Outcome: Progressing Goal: Signs and symptoms of infection will decrease Outcome: Progressing   Problem: Clinical Measurements: Goal: Diagnostic test results will improve Outcome: Progressing   Problem: Clinical Measurements: Goal: Signs and symptoms of infection will decrease Outcome: Progressing   Problem: Clinical Measurements: Goal: Will remain free from infection Outcome: Progressing Goal: Diagnostic test results will improve Outcome: Progressing   Problem: Clinical Measurements: Goal: Will remain free from infection Outcome: Progressing   Problem: Clinical Measurements: Goal: Diagnostic test results will improve Outcome: Progressing   Problem: Clinical Measurements: Goal: Diagnostic test results will improve Outcome: Progressing   Problem: Nutrition: Goal: Adequate nutrition will be maintained Outcome: Progressing   Problem: Nutrition: Goal: Adequate nutrition will be maintained Outcome: Progressing   Problem: Coping: Goal: Level of anxiety will decrease Outcome: Progressing   Problem: Coping: Goal: Level of anxiety will decrease Outcome: Progressing

## 2023-09-18 NOTE — Progress Notes (Signed)
 Palliative:  HPI: 68 year old male with history of non-insulin -dependent diabetes mellitus, hyperlipidemia, neuropathy, intellectual learning difficulty/dementia, COPD, on baseline 4 L nasal cannula, heart failure preserved ejection fraction, who presents emergency department for chief concerns of respiratory distress noticed by nursing staff requiring intubation (now successfully extubated). Hospitalization complicated by PE, mucus plugging s/p bronchoscopy, unable to tolerate heparin  due to bleeding, and now with ongoing poor intake/appetite - failure to thrive.   I met today with Paul Vaughan. He does interact a little more with me this morning briefly. He has delayed verbal response and more eye opening. However, he falls back asleep and does not maintain alertness and interaction more than a couple minutes. Discussed with RN who reports he ate a pancake for breakfast but not much liquid intake. He ate ~30% of his lunch. SLP has notified that she had increased concerns of aspiration risk during assessment today.   I was able to connect with DSS legal guardian, Paul Vaughan, via phone. We had a discussion regarding Mr. Suchan's progress. We discussed that he has eaten a little more today but still not enough to sustain him. We discussed that his prognosis remains very poor. We discussed minimizing suffering and allowing for comfort and optimal quality of life. After discussion it was decided to d/c Cortrak, continue dysphagia 1 nectar thick diet, allowing comfort feeds of desired/requested items, d/c heparin  infusion. We discussed continuing medications that will add to his comfort by mouth but recognizing he may not be able to swallow these or take them consistently. We will have comfort medications as needed in place. We discussed transition to facility with hospice vs hospice facility. We will proceed with comfort focused care.   All questions/concerns addressed. Emotional support provided.    Exam: Awakens and interacts briefly. Delayed response. Responses brief and inconsistent. No distress. Vocal quality seems more wet today. Breathing regular, unlabored. Abd soft. Generalized edema especially in extremities.   Plan: - DNR/DNI - Comfort care - D/C Cortrak and heparin  gtt - Transition to LTC facility with hospice vs hospice facility  60 min  Vila Grayer, NP Palliative Medicine Team Pager [phone] (Please see [email or web address] for schedule) Team Phone [phone]

## 2023-09-19 DIAGNOSIS — A419 Sepsis, unspecified organism: Secondary | ICD-10-CM | POA: Diagnosis not present

## 2023-09-19 DIAGNOSIS — J189 Pneumonia, unspecified organism: Secondary | ICD-10-CM | POA: Diagnosis not present

## 2023-09-19 DIAGNOSIS — J9621 Acute and chronic respiratory failure with hypoxia: Secondary | ICD-10-CM | POA: Diagnosis not present

## 2023-09-19 DIAGNOSIS — Z515 Encounter for palliative care: Secondary | ICD-10-CM | POA: Diagnosis not present

## 2023-09-19 DIAGNOSIS — I2699 Other pulmonary embolism without acute cor pulmonale: Secondary | ICD-10-CM | POA: Diagnosis not present

## 2023-09-19 MED ORDER — HALOPERIDOL LACTATE 2 MG/ML PO CONC: 2.0000 mg | ORAL | Status: DC | PRN

## 2023-09-19 MED ORDER — HALOPERIDOL LACTATE 5 MG/ML IJ SOLN: 2.0000 mg | INTRAMUSCULAR | Status: DC | PRN

## 2023-09-19 NOTE — Progress Notes (Signed)
 PROGRESS NOTE    Paul Vaughan  ZOX:[id number] DOB: [birth year] DOA: 08/31/2023 PCP: Meri Stammer, NP    Brief Narrative:  Paul Vaughan is a 68 year old male with history of non-insulin -dependent diabetes mellitus, hyperlipidemia, neuropathy, intellectual learning difficulty/dementia, COPD, on baseline 4 L nasal cannula, heart failure preserved ejection fraction, who presents emergency department for chief concerns of respiratory distress noticed by nursing staff.    Of note, he was recently admitted from 3/16 till 08/19/2023 when presented with generalized weakness, treated for pneumonia and rhabdo. Patient did had worsening respiratory status during that admission which was stabilizes and he was discharged on 4 L of oxygen to SNF.    5/2: Transferred to [facility] service.  Patient was successfully extubated to heated high flow nasal cannula on 5/1.  5/4: Oxygen status improving.  Weaned to 5 L nasal cannula 5/6: Hyperglycemia noted after tube feed rate was decreased 5/7: Palliative care consult.  Consideration for comfort care/hospice depending on discussion with legal guardian.  DC telemetry, transfer to MedSurg 5/8: Hospice home evaluation pending  Assessment & Plan:   Principal Problem:   Severe sepsis with acute organ dysfunction [facility]) Active Problems:   Multifocal pneumonia   Acute on chronic hypoxic respiratory failure (HCC)   Acute pulmonary embolism (HCC)   AKI (acute kidney injury) (HCC)   Elevated LFTs   Essential (primary) hypertension   Chronic diastolic CHF (congestive heart failure) (HCC)   Adjustment disorder with mixed disturbance of emotions and conduct   Chronic depression   Hyperlipidemia, unspecified   Borderline intellectual disability   Malnutrition of moderate degree  #Acute Hypoxic Respiratory Failure  #Multifocal Pneumonia~Aspiration #Pulmonary Embolism #Pulmonary Edema & Pleural effusion #Mucus plugging Hx: COPD on baseline 4L [place] CTA Chest  on 4/20: revealed PE in the right superior pulmonary artery, along with patchy airspace opacities bilaterally with consolidate changes in the LUL consistent with multifocal pneumonia Patient successfully extubated to heated high flow nasal cannula on 5/1.  Regular sedation thus far.  Per palliative care if patient decompensates will transition to full comfort measures.  They are waiting to have discussion with legal guardian Comfort care now Continue oxygen 2 L  #Hypotension: septic shock +/- sedation related #Septic shock #Acute decompensated HFmrEF Echocardiogram 09/02/23: LVEF 45-50%, mild LVH, indeterminate diastolic parameters, RV not well visualized Shock physiology has resolved.  Blood pressure has improved.  Lactic acid normalized.  Troponin negative x 2. Comfort care now  #Acute blood loss anemia due to hemoptysis vs possible GI bleed ~improving  Hemoglobin stable but patient has had 2 episodes of bleeding after initiation of heparin  gtt. Comfort care  #Type II diabetes mellitus  #AKI~resolved  #Hypernatremia  #Hypophosphatemia~resolved    Nutrition Speech therapy evaluated and recommends dysphagia 1 with nectar thick liquids -He displays increased risk for aspiration mainly due to his mentation/lack of alertness is limiting his consumption of oral nutrition    DVT prophylaxis: Comfort care Code Status: DNR/comfort care Family Communication: Updated legal guardian yesterday they are on board with comfort care/hospice based on the discussion with palliative care team Disposition Plan: Status is: Inpatient Remains inpatient appropriate because: Multiple acute issues as above   Level of care: Med-Surg  Consultants:  Palliative care  Procedures:  Bronchoscopy x 2  Antimicrobials:   Subjective:  No new issues.  Remains comfortable  Objective: Vitals:   09/18/23 1415 09/18/23 1930 09/18/23 2100 09/19/23 0717  BP: 131/62  (!) 148/78   Pulse: (!) 110  98   Resp:  20  14   Temp: 98.2 F (36.8 C)  98 F (36.7 C)   TempSrc:   Oral   SpO2: 97% 93% 93% 93%  Weight:      Height:        Intake/Output Summary (Last 24 hours) at 09/19/2023 1510 Last data filed at 09/19/2023 1300 Gross per 24 hour  Intake 0 ml  Output 800 ml  Net -800 ml   Filed Weights   09/16/23 0426 09/17/23 0400 09/18/23 0323  Weight: 80.2 kg 77.3 kg 79.2 kg    Examination:  General exam: Lethargic.  Frail and chronically ill Respiratory system: Crackles at bases.  Poor respiratory effort.  3 L Cardiovascular system: S1-S2, regular rate and rhythm, no murmurs, no pedal edema Gastrointestinal system: Soft, NT/ND, normal bowel sounds Central nervous system: Awake.  Alert unable to assess orientation Extremities: Marked decreased power Skin: No rashes, lesions or ulcers Psychiatry: Judgment impaired.  Affect blunted.    Data Reviewed: I have personally reviewed following labs and imaging studies  CBC: Recent Labs  Lab 09/13/23 0234 09/14/23 0302 09/15/23 0302 09/16/23 0310 09/17/23 0300 09/18/23 0518  WBC 11.5* 8.8 10.6* 12.0* 9.5 11.0*  NEUTROABS 7.9*  --   --   --   --   --   HGB 9.6* 9.9* 10.9* 10.6* 9.0* 10.3*  HCT 28.9* 29.6* 33.4* 32.2* 26.9* 30.4*  MCV 96.0 95.8 96.5 98.5 97.5 97.1  PLT 236 247 295 304 262 285   Basic Metabolic Panel: Recent Labs  Lab 09/13/23 0234 09/15/23 0302 09/16/23 0831 09/17/23 0300 09/17/23 1231 09/18/23 0518 09/18/23 1423  NA 146* 147* 146*  --  141 142  --   K 3.6 3.2* 3.0*  --  3.2* 2.9* 3.6  CL 107 99 99  --  98 100  --   CO2 33* 36* 38*  --  35* 33*  --   GLUCOSE 150* 115* 145*  --  111* 96  --   BUN 69* 58* 61*  --  51* 48*  --   CREATININE 0.77 0.67 0.76  --  0.68 0.65  --   CALCIUM  8.5* 8.6* 8.2*  --  8.3* 8.5*  --   MG 2.3  --   --  2.2  --   --   --   PHOS 3.4  --   --  3.1  --   --   --     Recent Labs  Lab 09/13/23 0234  AMMONIA 28    CBG: Recent Labs  Lab 09/17/23 2323 09/18/23 0317  09/18/23 0719 09/18/23 1106 09/18/23 1602  GLUCAP 133* 129* 99 147* 95       Scheduled Meds:  escitalopram   5 mg Oral QHS   gabapentin   100 mg Oral Q8H   ipratropium-albuterol   3 mL Nebulization TID   mouth rinse  15 mL Mouth Rinse 4 times per day   QUEtiapine   150 mg Oral QHS   QUEtiapine   50 mg Oral 2 times per day   valproic  acid  500 mg Oral TID   Continuous Infusions:     LOS: 19 days   Time spent 35 minutes  Brenna Cam, MD [facility]   If 7PM-7AM, please contact night-coverage  09/19/2023, 3:10 PM

## 2023-09-19 NOTE — Progress Notes (Signed)
 Palliative:  HPI: 68 year old male with history of non-insulin -dependent diabetes mellitus, hyperlipidemia, neuropathy, intellectual learning difficulty/dementia, COPD, on baseline 4 L nasal cannula, heart failure preserved ejection fraction, who presents emergency department for chief concerns of respiratory distress noticed by nursing staff requiring intubation (now successfully extubated). Hospitalization complicated by PE, mucus plugging s/p bronchoscopy, unable to tolerate heparin  due to bleeding, and now with ongoing poor intake/appetite - failure to thrive.    I met today at Paul Vaughan's bedside. He arouses and has verbal response. A little more consistent response. He declines breakfast but does share desire for Magic Cup at bedside. I sat him up straight and provided him with 3 bites of Magic Cup. He seems to enjoy but had increased cough with each bite. He was tired and declined further intake. I turned the television on and left him to rest.   Paul Vaughan is overall comfortable at this time but does have tachypnea and labored breathing after limited intake. I anticipate he will have increasing respiratory symptoms and increased need for symptom management. We have also d/c anticoagulation for known PE. He is high risk for acute symptoms. I do feel his prognosis is very poor and likely days to week. I believe it would be reasonable to consider hospice facility placement.   Left voicemail for DSS legal guardian to call back for update on status.   All questions/concerns addressed. Emotional support provided. Discussed with Dr. Mason Sole, TOC.   Exam: Awakens. Increased tachypnea and labored breathing with intake. Abd soft. Extremities edematous.   Plan: - DNR/DNI - Comfort care - Hospice to evaluate for hospice facility appropriateness tomorrow  50 min  Vila Grayer, NP Palliative Medicine Team Pager [phone] (Please see [email or web address] for schedule) Team Phone [phone]

## 2023-09-19 NOTE — TOC Progression Note (Signed)
 Transition of Care [facility]) - Progression Note    Patient Details  Name: Paul Vaughan MRN: [id number] Date of Birth: 25-Mar-1956  Transition of Care [facility]) CM/SW Contact  Loman Risk, RN Phone Number: 09/19/2023, 3:57 PM  Clinical Narrative:    Per Gena at [facility] patient would have to return under his medicare benefits for a skilled need.  She states that a program change for his medicaid has been submitted for LTC  Evalyn Hillier with Palliative has discussed goals of care with guardian, and made a referral to Marylou Sobers with [employer] to screen for inpatient hospice eligibility      Expected Discharge Plan: Skilled Nursing Facility Barriers to Discharge: Continued Medical Work up  Expected Discharge Plan and Services   Discharge Planning Services: CM Consult Post Acute Care Choice: Skilled Nursing Facility Living arrangements for the past 2 months: Skilled Nursing Facility                                       Social Determinants of Health (SDOH) Interventions SDOH Screenings   Food Insecurity: Patient Unable To Answer (08/31/2023)  Housing: Patient Unable To Answer (09/01/2023)  Transportation Needs: Patient Unable To Answer (08/31/2023)  Utilities: Patient Unable To Answer (08/31/2023)  Social Connections: Patient Unable To Answer (08/31/2023)  Tobacco Use: High Risk (08/31/2023)    Readmission Risk Interventions    07/01/2021    1:02 PM  Readmission Risk Prevention Plan  Transportation Screening Complete  PCP or Specialist Appt within 5-7 Days Complete  Home Care Screening Complete  Medication Review (RN CM) Complete

## 2023-09-19 NOTE — Care Management Important Message (Signed)
 Important Message  Patient Details  Name: Paul Vaughan MRN: [id number] Date of Birth: March 17, 1956   Important Message Given:  Yes - Medicare IM     Anise Kerns 09/19/2023, 1:34 PM

## 2023-09-19 NOTE — Progress Notes (Signed)
 [facility] Liaison Note  Received a referral to evaluate patient for the Hospice Home tomorrow.  Patient's guardian agreeable.    Please call with any Hospice related questions or concerns.    Thank you for the opportunity to participate in this patient's care  [facility] Liaison 336 [phone]

## 2023-09-19 NOTE — Plan of Care (Signed)
  Problem: Clinical Measurements: Goal: Will remain free from infection Outcome: Progressing Goal: Respiratory complications will improve Outcome: Progressing Goal: Cardiovascular complication will be avoided Outcome: Progressing   Problem: Nutrition: Goal: Adequate nutrition will be maintained Outcome: Progressing   Problem: Coping: Goal: Level of anxiety will decrease Outcome: Progressing   Problem: Elimination: Goal: Will not experience complications related to bowel motility Outcome: Progressing Goal: Will not experience complications related to urinary retention Outcome: Progressing   Problem: Pain Managment: Goal: General experience of comfort will improve and/or be controlled Outcome: Progressing   Problem: Safety: Goal: Ability to remain free from injury will improve Outcome: Progressing

## 2023-09-20 DIAGNOSIS — Z515 Encounter for palliative care: Secondary | ICD-10-CM | POA: Diagnosis not present

## 2023-09-20 DIAGNOSIS — I2699 Other pulmonary embolism without acute cor pulmonale: Secondary | ICD-10-CM | POA: Diagnosis not present

## 2023-09-20 DIAGNOSIS — J9621 Acute and chronic respiratory failure with hypoxia: Secondary | ICD-10-CM | POA: Diagnosis not present

## 2023-09-20 DIAGNOSIS — A419 Sepsis, unspecified organism: Secondary | ICD-10-CM | POA: Diagnosis not present

## 2023-09-20 DIAGNOSIS — J189 Pneumonia, unspecified organism: Secondary | ICD-10-CM | POA: Diagnosis not present

## 2023-09-20 DIAGNOSIS — R6520 Severe sepsis without septic shock: Secondary | ICD-10-CM | POA: Diagnosis not present

## 2023-09-20 MED ORDER — IPRATROPIUM-ALBUTEROL 0.5-2.5 (3) MG/3ML IN SOLN
3.0000 mL | Freq: Two times a day (BID) | RESPIRATORY_TRACT | Status: DC
  Administered 2023-09-21: 3 mL via RESPIRATORY_TRACT
  Filled 2023-09-20: qty 3

## 2023-09-20 NOTE — Progress Notes (Signed)
 PT Cancellation Note  Patient Details Name: Paul Vaughan MRN: [id number] DOB: 11/24/1955   Cancelled Treatment:    Reason Eval/Treat Not Completed: Medical issues which prohibited therapy (Consult received and chart reviewed. Per discussion with care team, patient pending assessment for hospice home. Will hold PT/OT evaluations for now and re-assess if appropriate and goals of care support.)  [name], PT, DPT, NCS 09/20/23, 9:03 AM [phone]

## 2023-09-20 NOTE — Plan of Care (Signed)
  Problem: Clinical Measurements: Goal: Will remain free from infection Outcome: Progressing Goal: Respiratory complications will improve Outcome: Progressing Goal: Cardiovascular complication will be avoided Outcome: Progressing   Problem: Nutrition: Goal: Adequate nutrition will be maintained Outcome: Progressing   Problem: Coping: Goal: Level of anxiety will decrease Outcome: Progressing   Problem: Elimination: Goal: Will not experience complications related to bowel motility Outcome: Progressing Goal: Will not experience complications related to urinary retention Outcome: Progressing   Problem: Pain Managment: Goal: General experience of comfort will improve and/or be controlled Outcome: Progressing   Problem: Safety: Goal: Ability to remain free from injury will improve Outcome: Progressing   Problem: Skin Integrity: Goal: Risk for impaired skin integrity will decrease Outcome: Progressing   Problem: Activity: Goal: Ability to tolerate increased activity will improve Outcome: Progressing   Problem: Clinical Measurements: Goal: Ability to maintain a body temperature in the normal range will improve Outcome: Progressing   Problem: Respiratory: Goal: Ability to maintain adequate ventilation will improve Outcome: Progressing Goal: Ability to maintain a clear airway will improve Outcome: Progressing   Problem: Activity: Goal: Ability to tolerate increased activity will improve Outcome: Progressing   Problem: Respiratory: Goal: Ability to maintain a clear airway and adequate ventilation will improve Outcome: Progressing   Problem: Role Relationship: Goal: Method of communication will improve Outcome: Progressing   Problem: Coping: Goal: Ability to adjust to condition or change in health will improve Outcome: Progressing   Problem: Fluid Volume: Goal: Ability to maintain a balanced intake and output will improve Outcome: Progressing   Problem:  Metabolic: Goal: Ability to maintain appropriate glucose levels will improve Outcome: Progressing   Problem: Nutritional: Goal: Maintenance of adequate nutrition will improve Outcome: Progressing Goal: Progress toward achieving an optimal weight will improve Outcome: Progressing   Problem: Skin Integrity: Goal: Risk for impaired skin integrity will decrease Outcome: Progressing   Problem: Tissue Perfusion: Goal: Adequacy of tissue perfusion will improve Outcome: Progressing   Problem: Coping: Goal: Ability to identify and develop effective coping behavior will improve Outcome: Progressing   Problem: Clinical Measurements: Goal: Quality of life will improve Outcome: Progressing   Problem: Respiratory: Goal: Verbalizations of increased ease of respirations will increase Outcome: Progressing   Problem: Role Relationship: Goal: Family's ability to cope with current situation will improve Outcome: Progressing   Problem: Pain Management: Goal: Satisfaction with pain management regimen will improve Outcome: Progressing   Problem: Education: Goal: Knowledge of General Education information will improve Description: Including pain rating scale, medication(s)/side effects and non-pharmacologic comfort measures Outcome: Not Progressing   Problem: Education: Goal: Ability to describe self-care measures that may prevent or decrease complications (Diabetes Survival Skills Education) will improve Outcome: Not Progressing   Problem: Education: Goal: Knowledge of the prescribed therapeutic regimen will improve Outcome: Not Progressing   Problem: Role Relationship: Goal: Ability to verbalize concerns, feelings, and thoughts to partner or family member will improve Outcome: Not Progressing

## 2023-09-20 NOTE — TOC Progression Note (Signed)
 Transition of Care [facility]) - Progression Note    Patient Details  Name: Paul Vaughan MRN: [id number] Date of Birth: [birth year]  [facility] CM/SW Contact  Loman Risk, RN Phone Number: 09/20/2023, 3:42 PM  Clinical Narrative:    Conference call with Guardian Amparo Balk and Dr Mason Sole Team is in agreement patient not appropriate for rehab, and needs to transition to hospice.  Per Marylou Sobers with [facility] patient is not appropriate for in hospice care at the hospice home.  Candice to speak with DSS social worker to determine where the are in the process of medicaid program change  Per business office at [facility] until program change patient would be private pay hospice which is $320 per day for semiprivate room, and $350 a day for private room  Expected Discharge Plan: Skilled Nursing Facility Barriers to Discharge: Continued Medical Work up  Expected Discharge Plan and Services   Discharge Planning Services: CM Consult Post Acute Care Choice: Skilled Nursing Facility Living arrangements for the past 2 months: Skilled Nursing Facility                                       Social Determinants of Health (SDOH) Interventions SDOH Screenings   Food Insecurity: Patient Unable To Answer (08/31/2023)  Housing: Patient Unable To Answer (09/01/2023)  Transportation Needs: Patient Unable To Answer (08/31/2023)  Utilities: Patient Unable To Answer (08/31/2023)  Social Connections: Patient Unable To Answer (08/31/2023)  Tobacco Use: High Risk (08/31/2023)    Readmission Risk Interventions    07/01/2021    1:02 PM  Readmission Risk Prevention Plan  Transportation Screening Complete  PCP or Specialist Appt within 5-7 Days Complete  Home Care Screening Complete  Medication Review (RN CM) Complete

## 2023-09-20 NOTE — Progress Notes (Signed)
 PROGRESS NOTE    Paul Vaughan  UXL:[id number] DOB: 10-May-1956 DOA: 08/31/2023 PCP: Meri Stammer, NP    Brief Narrative:  Paul Vaughan is a 68 year old male with history of non-insulin -dependent diabetes mellitus, hyperlipidemia, neuropathy, intellectual learning difficulty/dementia, COPD, on baseline 4 L nasal cannula, heart failure preserved ejection fraction, who presents emergency department for chief concerns of respiratory distress noticed by nursing staff.    Of note, he was recently admitted from 3/16 till 08/19/2023 when presented with generalized weakness, treated for pneumonia and rhabdo. Patient did had worsening respiratory status during that admission which was stabilizes and he was discharged on 4 L of oxygen to SNF.    5/2: Transferred to [facility] service.  Patient was successfully extubated to heated high flow nasal cannula on 5/1.  5/4: Oxygen status improving.  Weaned to 5 L nasal cannula 5/6: Hyperglycemia noted after tube feed rate was decreased 5/7: Palliative care consult.  Consideration for comfort care/hospice depending on discussion with legal guardian.  DC telemetry, transfer to MedSurg 5/8: Hospice home evaluation pending 5/9: TOC working on placement with hospice  Assessment & Plan:   Principal Problem:   Severe sepsis with acute organ dysfunction (HCC) Active Problems:   Multifocal pneumonia   Acute on chronic hypoxic respiratory failure (HCC)   Acute pulmonary embolism (HCC)   AKI (acute kidney injury) (HCC)   Elevated LFTs   Essential (primary) hypertension   Chronic diastolic CHF (congestive heart failure) (HCC)   Adjustment disorder with mixed disturbance of emotions and conduct   Chronic depression   Hyperlipidemia, unspecified   Borderline intellectual disability   Malnutrition of moderate degree  #Acute Hypoxic Respiratory Failure  #Multifocal Pneumonia~Aspiration #Pulmonary Embolism #Pulmonary Edema & Pleural effusion #Mucus  plugging Hx: COPD on baseline 4L [place] CTA Chest on 4/20: revealed PE in the right superior pulmonary artery, along with patchy airspace opacities bilaterally with consolidate changes in the LUL consistent with multifocal pneumonia Patient successfully extubated to heated high flow nasal cannula on 5/1.  Regular sedation thus far.  Per palliative care if patient decompensates will transition to full comfort measures.  They are waiting to have discussion with legal guardian Comfort care now Continue oxygen 2 L  #Hypotension: septic shock +/- sedation related #Septic shock #Acute decompensated HFmrEF Echocardiogram 09/02/23: LVEF 45-50%, mild LVH, indeterminate diastolic parameters, RV not well visualized Shock physiology has resolved.  Blood pressure has improved.  Lactic acid normalized.  Troponin negative x 2. Comfort care now  #Acute blood loss anemia due to hemoptysis vs possible GI bleed ~improving  Hemoglobin stable but patient has had 2 episodes of bleeding after initiation of heparin  gtt. Comfort care  #Type II diabetes mellitus  #AKI~resolved  #Hypernatremia  #Hypophosphatemia~resolved    Nutrition Speech therapy evaluated and recommends dysphagia 1 with nectar thick liquids -He displays increased risk for aspiration mainly due to his mentation/lack of alertness is limiting his consumption of oral nutrition    DVT prophylaxis: Comfort care Code Status: DNR/comfort care Family Communication: Updated legal guardian today they are on board with comfort care/hospice based on the discussion with palliative care team Disposition Plan: Status is: Inpatient Remains inpatient appropriate because: Multiple acute issues as above, waiting for placement   Level of care: Med-Surg  Consultants:  Palliative care  Procedures:  Bronchoscopy x 2  Antimicrobials:   Subjective:  No new issues.  Remains comfortable  Objective: Vitals:   09/19/23 1950 09/19/23 2032 09/20/23 0842  09/20/23 1443  BP:  139/73 136/71 138/71  Pulse:  (!) 105 (!) 101 100  Resp:  20 16 16   Temp:  (!) 97.3 F (36.3 C) 98.7 F (37.1 C) 98.7 F (37.1 C)  TempSrc:      SpO2: 94% 90% (!) 87% (!) 88%  Weight:      Height:        Intake/Output Summary (Last 24 hours) at 09/20/2023 1515 Last data filed at 09/20/2023 1505 Gross per 24 hour  Intake 120 ml  Output --  Net 120 ml   Filed Weights   09/16/23 0426 09/17/23 0400 09/18/23 0323  Weight: 80.2 kg 77.3 kg 79.2 kg    Examination:  General exam: Lethargic.  Frail and chronically ill Respiratory system: Crackles at bases.  Poor respiratory effort.  3 L Cardiovascular system: S1-S2, regular rate and rhythm, no murmurs, no pedal edema Gastrointestinal system: Soft, NT/ND, normal bowel sounds Central nervous system: Awake.  Alert unable to assess orientation Extremities: Marked decreased power Skin: No rashes, lesions or ulcers Psychiatry: Judgment impaired.  Affect blunted.    Data Reviewed: I have personally reviewed following labs and imaging studies  CBC: Recent Labs  Lab 09/14/23 0302 09/15/23 0302 09/16/23 0310 09/17/23 0300 09/18/23 0518  WBC 8.8 10.6* 12.0* 9.5 11.0*  HGB 9.9* 10.9* 10.6* 9.0* 10.3*  HCT 29.6* 33.4* 32.2* 26.9* 30.4*  MCV 95.8 96.5 98.5 97.5 97.1  PLT 247 295 304 262 285   Basic Metabolic Panel: Recent Labs  Lab 09/15/23 0302 09/16/23 0831 09/17/23 0300 09/17/23 1231 09/18/23 0518 09/18/23 1423  NA 147* 146*  --  141 142  --   K 3.2* 3.0*  --  3.2* 2.9* 3.6  CL 99 99  --  98 100  --   CO2 36* 38*  --  35* 33*  --   GLUCOSE 115* 145*  --  111* 96  --   BUN 58* 61*  --  51* 48*  --   CREATININE 0.67 0.76  --  0.68 0.65  --   CALCIUM  8.6* 8.2*  --  8.3* 8.5*  --   MG  --   --  2.2  --   --   --   PHOS  --   --  3.1  --   --   --     No results for input(s): "AMMONIA" in the last 168 hours.   CBG: Recent Labs  Lab 09/17/23 2323 09/18/23 0317 09/18/23 0719 09/18/23 1106  09/18/23 1602  GLUCAP 133* 129* 99 147* 95       Scheduled Meds:  escitalopram   5 mg Oral QHS   gabapentin   100 mg Oral Q8H   [START ON 09/21/2023] ipratropium-albuterol   3 mL Nebulization BID   mouth rinse  15 mL Mouth Rinse 4 times per day   QUEtiapine   150 mg Oral QHS   QUEtiapine   50 mg Oral 2 times per day   valproic  acid  500 mg Oral TID   Continuous Infusions:     LOS: 20 days   Time spent 35 minutes  Brenna Cam, MD [facility]   If 7PM-7AM, please contact night-coverage  09/20/2023, 3:15 PM

## 2023-09-20 NOTE — Progress Notes (Addendum)
 [facility] Liaison Note  Patient was evaluated for the Hospice Home today.  [facility] physician did not approved patient for admission.  We are happy to re-evaluate patient if there are changes in his condition. Hospital team and guardian aware.      Please call with any Hospice realted questions or concerns.  [facility] can provide Hospice follow up in a LTC facility or palliative follow up if patient goes to SNF rehab. Patient's guardian, Amparo Balk, stated that she would want patient to have [facility] follow up when patient returns to facility.      Thank you for the opportunity to participate in this patient's care  [facility] Liaison [phone]

## 2023-09-20 NOTE — Progress Notes (Signed)
 Daily Progress Note   Patient Name: Jedrick Broden       Date: 09/20/2023 DOB: 1955/11/11  Age: 68 y.o. MRN#: [id number] Attending Physician: Brenna Cam, MD Primary Care Physician: Meri Stammer, NP Admit Date: 08/31/2023  Reason for Consultation/Follow-up: Establishing goals of care  HPI/Brief Hospital Review: Mr. Rusten Keath is a 68 year old male with history of non-insulin -dependent diabetes mellitus, hyperlipidemia, neuropathy, intellectual learning difficulty/dementia, COPD, on baseline 4 L nasal cannula, heart failure preserved ejection fraction, who presents emergency department for chief concerns of respiratory distress noticed by nursing staff.    4/26 remains intubated and sedated Unable to tolerate heparin  due to bleeding, found to have PE, s/p bronch due to bilateral mucus plugging (4/24) Will require second bronchoscopy today   Successful extubation 5/1, Dobhoff in place for nutrition   SLP evaluation 5/2 remains high risk for aspiration, recommendations to remain Full Code at this time   Palliative Medicine consulted for assisting with goals of care conversations.  Subjective: Extensive chart review has been completed prior to meeting patient including labs, vital signs, imaging, progress notes, orders, and available advanced directive documents from current and previous encounters.    Visited with Mr. Marlin at his bedside.  He is resting in bed with eyes closed but easily awakens to calling of his name.  He remains confused and unable to participate in goals of care conversations.  Assess symptoms.  He denies acute pain or discomfort.  Breakfast tray at bedside untouched Mr. Declerck denies being hungry or interested in having breakfast.  Reviewed MAR, low  symptom burden.  No recommendations to adjustments in regimen at this time.  Called and spoke with hospice liaison, aware of reassessment for IPU.  According to hospice liaison, Mr. Sandau remains an appropriate for IPU at this time.  Per chart review, TOC in conversations with legal guardian and planning disposition-likely to need long-term care placement with hospice following.  PMT to step away from daily visits but will remain available peripherally for ongoing needs and support.  Thank you for allowing the Palliative Medicine Team to assist in the care of this patient.  Total time:  35 minutes  Time spent includes: Detailed review of medical records (labs, imaging, vital signs), medically appropriate exam (mental status, respiratory, cardiac, skin), discussed with treatment team, counseling and educating patient,  family and staff, documenting clinical information, medication management and coordination of care.  [name], DNP, AGNP-C Palliative Medicine   Please contact Palliative Medicine Team phone at [phone] for questions and concerns.

## 2023-09-20 NOTE — Progress Notes (Signed)
 PT Cancellation Note  Patient Details Name: Paul Vaughan MRN: [id number] DOB: 09/07/1955   Cancelled Treatment:    Reason Eval/Treat Not Completed: Medical issues which prohibited therapy (Per continued dicussion with care team, patient not appropriate for rehab; continuing to pursue comfort care/hospice support at discharge)   Deloras Fess. Bevin Bucks, PT, DPT, NCS 09/20/23, 9:41 PM [phone]

## 2023-09-21 DIAGNOSIS — I2699 Other pulmonary embolism without acute cor pulmonale: Secondary | ICD-10-CM | POA: Diagnosis not present

## 2023-09-21 DIAGNOSIS — J9621 Acute and chronic respiratory failure with hypoxia: Secondary | ICD-10-CM | POA: Diagnosis not present

## 2023-09-21 DIAGNOSIS — A419 Sepsis, unspecified organism: Secondary | ICD-10-CM | POA: Diagnosis not present

## 2023-09-21 DIAGNOSIS — J189 Pneumonia, unspecified organism: Secondary | ICD-10-CM | POA: Diagnosis not present

## 2023-09-21 MED ORDER — IPRATROPIUM-ALBUTEROL 0.5-2.5 (3) MG/3ML IN SOLN: 3.0000 mL | Freq: Four times a day (QID) | RESPIRATORY_TRACT | Status: DC | PRN

## 2023-09-21 NOTE — Progress Notes (Signed)
 PROGRESS NOTE    Paul Vaughan  ZOX:[id number] DOB: [birth year] DOA: 08/31/2023 PCP: Meri Stammer, NP    Brief Narrative:  Paul Vaughan is a 68 year old male with history of non-insulin -dependent diabetes mellitus, hyperlipidemia, neuropathy, intellectual learning difficulty/dementia, COPD, on baseline 4 L nasal cannula, heart failure preserved ejection fraction, who presents emergency department for chief concerns of respiratory distress noticed by nursing staff.    Of note, he was recently admitted from 3/16 till 08/19/2023 when presented with generalized weakness, treated for pneumonia and rhabdo. Patient did had worsening respiratory status during that admission which was stabilizes and he was discharged on 4 L of oxygen to SNF.    5/2: Transferred to [facility] service.  Patient was successfully extubated to heated high flow nasal cannula on 5/1.  5/4: Oxygen status improving.  Weaned to 5 L nasal cannula 5/6: Hyperglycemia noted after tube feed rate was decreased 5/7: Palliative care consult.  Consideration for comfort care/hospice depending on discussion with legal guardian.  DC telemetry, transfer to MedSurg 5/8: Hospice home evaluation pending 5/9-5/10: TOC working on LTC placement with hospice  Assessment & Plan:   Principal Problem:   Severe sepsis with acute organ dysfunction (HCC) Active Problems:   Multifocal pneumonia   Acute on chronic hypoxic respiratory failure (HCC)   Acute pulmonary embolism (HCC)   AKI (acute kidney injury) (HCC)   Elevated LFTs   Essential (primary) hypertension   Chronic diastolic CHF (congestive heart failure) (HCC)   Adjustment disorder with mixed disturbance of emotions and conduct   Chronic depression   Hyperlipidemia, unspecified   Borderline intellectual disability   Malnutrition of moderate degree  #Acute Hypoxic Respiratory Failure  #Multifocal Pneumonia~Aspiration #Pulmonary Embolism #Pulmonary Edema & Pleural  effusion #Mucus plugging Hx: COPD on baseline 4L [place] CTA Chest on 4/20: revealed PE in the right superior pulmonary artery, along with patchy airspace opacities bilaterally with consolidate changes in the LUL consistent with multifocal pneumonia Patient successfully extubated to heated high flow nasal cannula on 5/1.  Regular sedation thus far.  Per palliative care if patient decompensates will transition to full comfort measures.  They are waiting to have discussion with legal guardian Comfort care now Continue oxygen for L  #Hypotension: septic shock +/- sedation related #Septic shock #Acute decompensated HFmrEF Echocardiogram 09/02/23: LVEF 45-50%, mild LVH, indeterminate diastolic parameters, RV not well visualized Shock physiology has resolved.  Blood pressure has improved.  Lactic acid normalized.  Troponin negative x 2. Comfort care now  #Acute blood loss anemia due to hemoptysis vs possible GI bleed ~improving  Hemoglobin stable but patient has had 2 episodes of bleeding after initiation of heparin  gtt. Comfort care  #Type II diabetes mellitus  #AKI~resolved  #Hypernatremia  #Hypophosphatemia~resolved    Nutrition Speech therapy evaluated and recommends dysphagia 1 with nectar thick liquids -He displays increased risk for aspiration mainly due to his mentation/lack of alertness is limiting his consumption of oral nutrition    DVT prophylaxis: Comfort care Code Status: DNR/comfort care Family Communication: Updated legal guardian today they are on board with comfort care/hospice based on the discussion with palliative care team Disposition Plan: Status is: Inpatient Remains inpatient appropriate because: Multiple acute issues as above, waiting for placement   Level of care: Med-Surg  Consultants:  Palliative care  Procedures:  Bronchoscopy x 2  Antimicrobials:   Subjective:  No new issues.  Waiting for placement Objective: Vitals:   09/20/23 1959 09/20/23  2000 09/21/23 0518 09/21/23 0849  BP: 137/75 121/62 [name])  156/85 (!) 159/76  Pulse: 100 86 (!) 102 99  Resp:  16  (!) 21  Temp:  99.2 F (37.3 C) 98.2 F (36.8 C) 98.6 F (37 C)  TempSrc:  Axillary Oral   SpO2: 95% 96% 92% 94%  Weight:      Height:        Intake/Output Summary (Last 24 hours) at 09/21/2023 1305 Last data filed at 09/20/2023 1745 Gross per 24 hour  Intake 60 ml  Output 600 ml  Net -540 ml   Filed Weights   09/16/23 0426 09/17/23 0400 09/18/23 0323  Weight: 80.2 kg 77.3 kg 79.2 kg    Examination:  General exam: Lethargic.  Frail and chronically ill Respiratory system: Crackles at bases.  Poor respiratory effort.  4 L Cardiovascular system: S1-S2, regular rate and rhythm, no murmurs, no pedal edema Gastrointestinal system: Soft, NT/ND, normal bowel sounds Central nervous system: Awake.  Alert unable to assess orientation Extremities: Marked decreased power Skin: No rashes, lesions or ulcers Psychiatry: Judgment impaired.  Affect blunted.    Data Reviewed: I have personally reviewed following labs and imaging studies  CBC: Recent Labs  Lab 09/15/23 0302 09/16/23 0310 09/17/23 0300 09/18/23 0518  WBC 10.6* 12.0* 9.5 11.0*  HGB 10.9* 10.6* 9.0* 10.3*  HCT 33.4* 32.2* 26.9* 30.4*  MCV 96.5 98.5 97.5 97.1  PLT 295 304 262 285   Basic Metabolic Panel: Recent Labs  Lab 09/15/23 0302 09/16/23 0831 09/17/23 0300 09/17/23 1231 09/18/23 0518 09/18/23 1423  NA 147* 146*  --  141 142  --   K 3.2* 3.0*  --  3.2* 2.9* 3.6  CL 99 99  --  98 100  --   CO2 36* 38*  --  35* 33*  --   GLUCOSE 115* 145*  --  111* 96  --   BUN 58* 61*  --  51* 48*  --   CREATININE 0.67 0.76  --  0.68 0.65  --   CALCIUM  8.6* 8.2*  --  8.3* 8.5*  --   MG  --   --  2.2  --   --   --   PHOS  --   --  3.1  --   --   --     No results for input(s): "AMMONIA" in the last 168 hours.   CBG: Recent Labs  Lab 09/17/23 2323 09/18/23 0317 09/18/23 0719 09/18/23 1106  09/18/23 1602  GLUCAP 133* 129* 99 147* 95       Scheduled Meds:  escitalopram   5 mg Oral QHS   gabapentin   100 mg Oral Q8H   ipratropium-albuterol   3 mL Nebulization BID   mouth rinse  15 mL Mouth Rinse 4 times per day   QUEtiapine   150 mg Oral QHS   QUEtiapine   50 mg Oral 2 times per day   valproic  acid  500 mg Oral TID   Continuous Infusions:     LOS: 21 days   Time spent 15  minutes  Brenna Cam, MD [facility]   If 7PM-7AM, please contact night-coverage  09/21/2023, 1:05 PM

## 2023-09-21 NOTE — Progress Notes (Signed)
 [facility] Liaison Note   HL team will follow peripherally throughout hospital stay.  Hospice will follow patient at LTC facility when a bed is found.     Please call with any Hospice related questions or concerns.    [facility] Liaison [phone]

## 2023-09-21 NOTE — Plan of Care (Signed)
  Problem: Education: Goal: Knowledge of General Education information will improve Description: Including pain rating scale, medication(s)/side effects and non-pharmacologic comfort measures Outcome: Progressing   Problem: Clinical Measurements: Goal: Will remain free from infection Outcome: Progressing Goal: Respiratory complications will improve Outcome: Progressing Goal: Cardiovascular complication will be avoided Outcome: Progressing   Problem: Nutrition: Goal: Adequate nutrition will be maintained Outcome: Progressing   Problem: Coping: Goal: Level of anxiety will decrease Outcome: Progressing   Problem: Elimination: Goal: Will not experience complications related to bowel motility Outcome: Progressing Goal: Will not experience complications related to urinary retention Outcome: Progressing   Problem: Pain Managment: Goal: General experience of comfort will improve and/or be controlled Outcome: Progressing   Problem: Safety: Goal: Ability to remain free from injury will improve Outcome: Progressing   Problem: Skin Integrity: Goal: Risk for impaired skin integrity will decrease Outcome: Progressing   Problem: Activity: Goal: Ability to tolerate increased activity will improve Outcome: Progressing   Problem: Clinical Measurements: Goal: Ability to maintain a body temperature in the normal range will improve Outcome: Progressing   Problem: Respiratory: Goal: Ability to maintain adequate ventilation will improve Outcome: Progressing Goal: Ability to maintain a clear airway will improve Outcome: Progressing   Problem: Activity: Goal: Ability to tolerate increased activity will improve Outcome: Progressing   Problem: Respiratory: Goal: Ability to maintain a clear airway and adequate ventilation will improve Outcome: Progressing   Problem: Role Relationship: Goal: Method of communication will improve Outcome: Progressing   Problem: Education: Goal:  Ability to describe self-care measures that may prevent or decrease complications (Diabetes Survival Skills Education) will improve Outcome: Progressing Goal: Individualized Educational Video(s) Outcome: Progressing   Problem: Coping: Goal: Ability to adjust to condition or change in health will improve Outcome: Progressing   Problem: Fluid Volume: Goal: Ability to maintain a balanced intake and output will improve Outcome: Progressing   Problem: Health Behavior/Discharge Planning: Goal: Ability to identify and utilize available resources and services will improve Outcome: Progressing Goal: Ability to manage health-related needs will improve Outcome: Progressing   Problem: Metabolic: Goal: Ability to maintain appropriate glucose levels will improve Outcome: Progressing   Problem: Nutritional: Goal: Maintenance of adequate nutrition will improve Outcome: Progressing Goal: Progress toward achieving an optimal weight will improve Outcome: Progressing   Problem: Skin Integrity: Goal: Risk for impaired skin integrity will decrease Outcome: Progressing   Problem: Tissue Perfusion: Goal: Adequacy of tissue perfusion will improve Outcome: Progressing   Problem: Education: Goal: Knowledge of the prescribed therapeutic regimen will improve Outcome: Progressing   Problem: Coping: Goal: Ability to identify and develop effective coping behavior will improve Outcome: Progressing   Problem: Clinical Measurements: Goal: Quality of life will improve Outcome: Progressing   Problem: Respiratory: Goal: Verbalizations of increased ease of respirations will increase Outcome: Progressing   Problem: Role Relationship: Goal: Family's ability to cope with current situation will improve Outcome: Progressing Goal: Ability to verbalize concerns, feelings, and thoughts to partner or family member will improve Outcome: Progressing   Problem: Pain Management: Goal: Satisfaction with pain  management regimen will improve Outcome: Progressing

## 2023-09-22 DIAGNOSIS — J189 Pneumonia, unspecified organism: Secondary | ICD-10-CM | POA: Diagnosis not present

## 2023-09-22 DIAGNOSIS — I2699 Other pulmonary embolism without acute cor pulmonale: Secondary | ICD-10-CM | POA: Diagnosis not present

## 2023-09-22 DIAGNOSIS — E440 Moderate protein-calorie malnutrition: Secondary | ICD-10-CM | POA: Diagnosis not present

## 2023-09-22 DIAGNOSIS — J9621 Acute and chronic respiratory failure with hypoxia: Secondary | ICD-10-CM | POA: Diagnosis not present

## 2023-09-22 DIAGNOSIS — Z515 Encounter for palliative care: Secondary | ICD-10-CM | POA: Diagnosis not present

## 2023-09-22 DIAGNOSIS — A419 Sepsis, unspecified organism: Secondary | ICD-10-CM | POA: Diagnosis not present

## 2023-09-22 NOTE — Progress Notes (Signed)
                                                                                                                                                                                                           Daily Progress Note   Patient Name: Isley Burke       Date: 09/22/2023 DOB: 21-Jun-1955  Age: 68 y.o. MRN#: [id number] Attending Physician: Brenna Cam, MD Primary Care Physician: Meri Stammer, NP Admit Date: 08/31/2023  Reason for Consultation/Follow-up: Establishing goals of care  HPI/Brief Hospital Review: Mr. Hamid Haser is a 68 year old male with history of non-insulin -dependent diabetes mellitus, hyperlipidemia, neuropathy, intellectual learning difficulty/dementia, COPD, on baseline 4 L nasal cannula, heart failure preserved ejection fraction, who presents emergency department for chief concerns of respiratory distress noticed by nursing staff.    4/26 remains intubated and sedated Unable to tolerate heparin  due to bleeding, found to have PE, s/p bronch due to bilateral mucus plugging (4/24) Will require second bronchoscopy today   Successful extubation 5/1  Transitioned to CMO 5/7-inappropriate for [facility]  Palliative Medicine consulted for assisting with goals of care conversations.  Subjective: Extensive chart review has been completed prior to meeting patient including labs, vital signs, imaging, progress notes, orders, and available advanced directive documents from current and previous encounters.    Visited with Mr. Habeck at his bedside.  He is resting in bed with eyes closed but does open his eyes and acknowledge my presence to calling of his name.  Assessed symptoms, he denies acute pain or discomfort.  Easily drifts off to sleep again without constant redirection.  No family or visitors at bedside during time of visit.  Reviewed MAR, low symptom burden, no recommendations to make adjustments to current regimen at this time.  According to chart review, plan  remains for long-term care placement with hospice following at facility.  TOC actively engaged in bed search.  PMT to step away from daily visits but will remain available peripherally for needs or concerns as they arise.  Thank you for allowing the Palliative Medicine Team to assist in the care of this patient.  Total time:  25 minutes  Time spent includes: Detailed review of medical records (labs, imaging, vital signs), medically appropriate exam (mental status, respiratory, cardiac, skin), discussed with treatment team, counseling and educating patient, family and staff, documenting clinical information, medication management and coordination of care.  [name], DNP, AGNP-C Palliative Medicine   Please contact Palliative Medicine Team phone at [phone] for questions and concerns.

## 2023-09-22 NOTE — Progress Notes (Signed)
 [facility] Liaison Note   HL team will follow peripherally throughout hospital stay.  Hospice will follow patient at LTC facility when a bed is found.     Please call with any Hospice related questions or concerns.    [facility] Liaison [phone]

## 2023-09-22 NOTE — Progress Notes (Signed)
 PROGRESS NOTE    Paul Vaughan  WUJ:[id number] DOB: November 11, 1955 DOA: 08/31/2023 PCP: Meri Stammer, NP    Brief Narrative:  Paul Vaughan is a 68 year old male with history of non-insulin -dependent diabetes mellitus, hyperlipidemia, neuropathy, intellectual learning difficulty/dementia, COPD, on baseline 4 L nasal cannula, heart failure preserved ejection fraction, who presents emergency department for chief concerns of respiratory distress noticed by nursing staff.    Of note, he was recently admitted from 3/16 till 08/19/2023 when presented with generalized weakness, treated for pneumonia and rhabdo. Patient did had worsening respiratory status during that admission which was stabilizes and he was discharged on 4 L of oxygen to SNF.    5/2: Transferred to [facility] service.  Patient was successfully extubated to heated high flow nasal cannula on 5/1.  5/4: Oxygen status improving.  Weaned to 5 L nasal cannula 5/6: Hyperglycemia noted after tube feed rate was decreased 5/7: Palliative care consult.  Consideration for comfort care/hospice depending on discussion with legal guardian.  DC telemetry, transfer to MedSurg 5/8: Hospice home evaluation pending 5/9-5/11: TOC working on LTC placement with hospice  Assessment & Plan:   Principal Problem:   Severe sepsis with acute organ dysfunction (HCC) Active Problems:   Multifocal pneumonia   Acute on chronic hypoxic respiratory failure (HCC)   Acute pulmonary embolism (HCC)   AKI (acute kidney injury) (HCC)   Elevated LFTs   Essential (primary) hypertension   Chronic diastolic CHF (congestive heart failure) (HCC)   Adjustment disorder with mixed disturbance of emotions and conduct   Chronic depression   Hyperlipidemia, unspecified   Borderline intellectual disability   Malnutrition of moderate degree  #Acute Hypoxic Respiratory Failure  #Multifocal Pneumonia~Aspiration #Pulmonary Embolism #Pulmonary Edema & Pleural  effusion #Mucus plugging Hx: COPD on baseline 4L [place] CTA Chest on 4/20: revealed PE in the right superior pulmonary artery, along with patchy airspace opacities bilaterally with consolidate changes in the LUL consistent with multifocal pneumonia Patient successfully extubated to heated high flow nasal cannula on 5/1.   legal guardian is in agreement with hospice/comfort care  #Hypotension: septic shock +/- sedation related #Septic shock #Acute decompensated HFmrEF Echocardiogram 09/02/23: LVEF 45-50%, mild LVH, indeterminate diastolic parameters, RV not well visualized Shock physiology has resolved.  Blood pressure has improved.  Lactic acid normalized.  Troponin negative x 2. Comfort care now  #Acute blood loss anemia due to hemoptysis vs possible GI bleed ~improving  Hemoglobin stable but patient has had 2 episodes of bleeding after initiation of heparin  gtt. Comfort care  #Type II diabetes mellitus  #AKI~resolved  #Hypernatremia  #Hypophosphatemia~resolved    Nutrition Speech therapy evaluated and recommends dysphagia 1 with nectar thick liquids -He displays increased risk for aspiration mainly due to his mentation/lack of alertness is limiting his consumption of oral nutrition    DVT prophylaxis: Comfort care Code Status: DNR/comfort care Family Communication: Updated legal guardian on 5/9 they are on board with comfort care/hospice based on the discussion with palliative care team Disposition Plan: Status is: Inpatient Remains inpatient appropriate because: Multiple acute issues as above, waiting for placement   Level of care: Med-Surg  Consultants:  Palliative care  Procedures:  Bronchoscopy x 2  Antimicrobials:   Subjective:  No new issues.  Seems lethargic today Objective: Vitals:   09/21/23 1447 09/21/23 2100 09/22/23 0400 09/22/23 0750  BP: (!) 140/78 (!) 152/75 (!) 141/69 104/65  Pulse: (!) 104 98 89 (!) 108  Resp: 20 16 16 16   Temp: 99.3 F (37.4 C)  98 F (36.7 C) 98.9 F (37.2 C) 100 F (37.8 C)  TempSrc: Oral  Axillary   SpO2: 96%   95%  Weight:      Height:        Intake/Output Summary (Last 24 hours) at 09/22/2023 1119 Last data filed at 09/22/2023 1100 Gross per 24 hour  Intake 0 ml  Output 550 ml  Net -550 ml   Filed Weights   09/16/23 0426 09/17/23 0400 09/18/23 0323  Weight: 80.2 kg 77.3 kg 79.2 kg    Examination:  General exam: Lethargic.  Frail and chronically ill Respiratory system: Crackles at bases.  Poor respiratory effort.  4 L Cardiovascular system: S1-S2, regular rate and rhythm, no murmurs, no pedal edema Gastrointestinal system: Soft, NT/ND, normal bowel sounds Central nervous system: Lethargic, unable to assess orientation Extremities: Marked decreased power Skin: No rashes, lesions or ulcers Psychiatry: Judgment impaired.  Affect blunted.    Data Reviewed: I have personally reviewed following labs and imaging studies  CBC: Recent Labs  Lab 09/16/23 0310 09/17/23 0300 09/18/23 0518  WBC 12.0* 9.5 11.0*  HGB 10.6* 9.0* 10.3*  HCT 32.2* 26.9* 30.4*  MCV 98.5 97.5 97.1  PLT 304 262 285   Basic Metabolic Panel: Recent Labs  Lab 09/16/23 0831 09/17/23 0300 09/17/23 1231 09/18/23 0518 09/18/23 1423  NA 146*  --  141 142  --   K 3.0*  --  3.2* 2.9* 3.6  CL 99  --  98 100  --   CO2 38*  --  35* 33*  --   GLUCOSE 145*  --  111* 96  --   BUN 61*  --  51* 48*  --   CREATININE 0.76  --  0.68 0.65  --   CALCIUM  8.2*  --  8.3* 8.5*  --   MG  --  2.2  --   --   --   PHOS  --  3.1  --   --   --     No results for input(s): "AMMONIA" in the last 168 hours.   CBG: Recent Labs  Lab 09/17/23 2323 09/18/23 0317 09/18/23 0719 09/18/23 1106 09/18/23 1602  GLUCAP 133* 129* 99 147* 95       Scheduled Meds:  escitalopram   5 mg Oral QHS   gabapentin   100 mg Oral Q8H   mouth rinse  15 mL Mouth Rinse 4 times per day   QUEtiapine   150 mg Oral QHS   QUEtiapine   50 mg Oral 2 times per  day   valproic  acid  500 mg Oral TID   Continuous Infusions:     LOS: 22 days   Time spent 15  minutes  Brenna Cam, MD [facility]   If 7PM-7AM, please contact night-coverage  09/22/2023, 11:19 AM

## 2023-09-23 DIAGNOSIS — I2699 Other pulmonary embolism without acute cor pulmonale: Secondary | ICD-10-CM | POA: Diagnosis not present

## 2023-09-23 DIAGNOSIS — J9621 Acute and chronic respiratory failure with hypoxia: Secondary | ICD-10-CM | POA: Diagnosis not present

## 2023-09-23 DIAGNOSIS — A419 Sepsis, unspecified organism: Secondary | ICD-10-CM | POA: Diagnosis not present

## 2023-09-23 DIAGNOSIS — J189 Pneumonia, unspecified organism: Secondary | ICD-10-CM | POA: Diagnosis not present

## 2023-09-23 MED ORDER — LORAZEPAM 2 MG/ML PO CONC: 1.0000 mg | ORAL | Status: DC | PRN

## 2023-09-23 MED ORDER — LORAZEPAM 1 MG PO TABS: 1.0000 mg | ORAL_TABLET | ORAL | Status: DC | PRN

## 2023-09-23 MED ORDER — LORAZEPAM 2 MG/ML IJ SOLN: 1.0000 mg | INTRAMUSCULAR | Status: DC | PRN

## 2023-09-23 MED ORDER — MORPHINE 100MG IN NS 100ML (1MG/ML) PREMIX INFUSION
1.0000 mg/h | INTRAVENOUS | Status: DC
  Administered 2023-09-23: 2 mg/h via INTRAVENOUS
  Filled 2023-09-23: qty 100

## 2023-09-23 MED ORDER — MORPHINE BOLUS VIA INFUSION
1.0000 mg | INTRAVENOUS | Status: DC | PRN
  Administered 2023-09-24 (×3): 4 mg via INTRAVENOUS

## 2023-09-23 NOTE — Progress Notes (Signed)
 PROGRESS NOTE    Paul Vaughan  MVH:[id number] DOB: November 10, 1955 DOA: 08/31/2023 PCP: Meri Stammer, NP    Brief Narrative:  Paul Vaughan is a 68 year old male with history of non-insulin -dependent diabetes mellitus, hyperlipidemia, neuropathy, intellectual learning difficulty/dementia, COPD, on baseline 4 L nasal cannula, heart failure preserved ejection fraction, who presents emergency department for chief concerns of respiratory distress noticed by nursing staff.    Of note, he was recently admitted from 3/16 till 08/19/2023 when presented with generalized weakness, treated for pneumonia and rhabdo. Patient did had worsening respiratory status during that admission which was stabilizes and he was discharged on 4 L of oxygen to SNF.    5/2: Transferred to [facility] service.  Patient was successfully extubated to heated high flow nasal cannula on 5/1.  5/4: Oxygen status improving.  Weaned to 5 L nasal cannula 5/6: Hyperglycemia noted after tube feed rate was decreased 5/7: Palliative care consult.  Consideration for comfort care/hospice depending on discussion with legal guardian.  DC telemetry, transfer to MedSurg 5/8: Hospice home evaluation pending 5/9-5/12: TOC working on LTC placement with hospice  Assessment & Plan:   Principal Problem:   Severe sepsis with acute organ dysfunction (HCC) Active Problems:   Multifocal pneumonia   Acute on chronic hypoxic respiratory failure (HCC)   Acute pulmonary embolism (HCC)   AKI (acute kidney injury) (HCC)   Elevated LFTs   Essential (primary) hypertension   Chronic diastolic CHF (congestive heart failure) (HCC)   Adjustment disorder with mixed disturbance of emotions and conduct   Chronic depression   Hyperlipidemia, unspecified   Borderline intellectual disability   Malnutrition of moderate degree  #Acute Hypoxic Respiratory Failure  #Multifocal Pneumonia~Aspiration #Pulmonary Embolism #Pulmonary Edema & Pleural  effusion #Mucus plugging Hx: COPD on baseline 4L [place] CTA Chest on 4/20: revealed PE in the right superior pulmonary artery, along with patchy airspace opacities bilaterally with consolidate changes in the LUL consistent with multifocal pneumonia Patient successfully extubated to heated high flow nasal cannula on 5/1.   legal guardian is in agreement with hospice/comfort care He is developing low-grade fever with a Tmax of 100.5.  Will monitor and manage symptomatically for now  #Hypotension: septic shock +/- sedation related #Septic shock #Acute decompensated HFmrEF Echocardiogram 09/02/23: LVEF 45-50%, mild LVH, indeterminate diastolic parameters, RV not well visualized Shock physiology has resolved.  Blood pressure has improved.  Lactic acid normalized.  Troponin negative x 2. Comfort care now  #Acute blood loss anemia due to hemoptysis vs possible GI bleed ~improving  Hemoglobin stable but patient has had 2 episodes of bleeding after initiation of heparin  gtt. Comfort care  #Type II diabetes mellitus  #AKI~resolved  #Hypernatremia  #Hypophosphatemia~resolved    Nutrition Speech therapy evaluated and recommends dysphagia 1 with nectar thick liquids -He displays increased risk for aspiration mainly due to his mentation/lack of alertness is limiting his consumption of oral nutrition    DVT prophylaxis: Comfort care Code Status: DNR/comfort care Family Communication: Updated legal guardian on 5/9 they are on board with comfort care/hospice based on the discussion with palliative care team Disposition Plan: Status is: Inpatient Remains inpatient appropriate because: Multiple acute issues as above, waiting for placement   Level of care: Med-Surg  Consultants:  Palliative care  Procedures:  Bronchoscopy x 2  Antimicrobials:   Subjective:  No new issues.  Getting medications by nursing.  Has low-grade fever of 100.5  Objective: Vitals:   09/22/23 0750 09/22/23 1657  09/22/23 2128 09/23/23 0803  BP: 104/65  130/71 134/71 122/68  Pulse: (!) 108 (!) 108 (!) 108 (!) 105  Resp: 16 18 18 18   Temp: 100 F (37.8 C) 99.5 F (37.5 C) 99.1 F (37.3 C) (!) 100.5 F (38.1 C)  TempSrc:  Oral Oral Axillary  SpO2: 95% 90% 92% (!) 88%  Weight:      Height:        Intake/Output Summary (Last 24 hours) at 09/23/2023 1515 Last data filed at 09/23/2023 1436 Gross per 24 hour  Intake 130 ml  Output 775 ml  Net -645 ml   Filed Weights   09/16/23 0426 09/17/23 0400 09/18/23 0323  Weight: 80.2 kg 77.3 kg 79.2 kg    Examination:  General exam: 68 year old male looking frail and chronically ill Respiratory system: Crackles at bases.  Poor respiratory effort. 5 L Cardiovascular system: S1-S2, regular rate and rhythm, no murmurs, no pedal edema Gastrointestinal system: Soft, NT/ND, normal bowel sounds Central nervous system: Awake and alert, unable to assess orientation Extremities: Marked decreased power Skin: No rashes, lesions or ulcers Psychiatry: Judgment impaired.  Affect blunted.    Data Reviewed: I have personally reviewed following labs and imaging studies  CBC: Recent Labs  Lab 09/17/23 0300 09/18/23 0518  WBC 9.5 11.0*  HGB 9.0* 10.3*  HCT 26.9* 30.4*  MCV 97.5 97.1  PLT 262 285   Basic Metabolic Panel: Recent Labs  Lab 09/17/23 0300 09/17/23 1231 09/18/23 0518 09/18/23 1423  NA  --  141 142  --   K  --  3.2* 2.9* 3.6  CL  --  98 100  --   CO2  --  35* 33*  --   GLUCOSE  --  111* 96  --   BUN  --  51* 48*  --   CREATININE  --  0.68 0.65  --   CALCIUM   --  8.3* 8.5*  --   MG 2.2  --   --   --   PHOS 3.1  --   --   --     No results for input(s): "AMMONIA" in the last 168 hours.   CBG: Recent Labs  Lab 09/17/23 2323 09/18/23 0317 09/18/23 0719 09/18/23 1106 09/18/23 1602  GLUCAP 133* 129* 99 147* 95       Scheduled Meds:  escitalopram   5 mg Oral QHS   gabapentin   100 mg Oral Q8H   mouth rinse  15 mL Mouth  Rinse 4 times per day   QUEtiapine   150 mg Oral QHS   QUEtiapine   50 mg Oral 2 times per day   valproic  acid  500 mg Oral TID   Continuous Infusions:     LOS: 23 days   Time spent 15  minutes  Brenna Cam, MD [facility]   If 7PM-7AM, please contact night-coverage  09/23/2023, 3:15 PM

## 2023-09-23 NOTE — Plan of Care (Signed)
   Problem: Clinical Measurements: Goal: Will remain free from infection Outcome: Progressing Goal: Cardiovascular complication will be avoided Outcome: Progressing

## 2023-09-23 NOTE — Plan of Care (Signed)

## 2023-09-23 NOTE — TOC Progression Note (Signed)
 Transition of Care [facility]) - Progression Note    Patient Details  Name: Paul Vaughan MRN: [id number] Date of Birth: 11/02/1955  [facility] CM/SW Contact  Loman Risk, RN Phone Number: 09/23/2023, 12:04 PM  Clinical Narrative:    Per MD patient medically stable for discharge Call placed to [facility] with DSS, she states she has not received an update yet from her colleague on where they are at with regards to [facility] program change.  She is aware that patient is medically ready for discharge and states that she will have Wendi call me    Expected Discharge Plan: Skilled Nursing Facility Barriers to Discharge: Continued Medical Work up  Expected Discharge Plan and Services   Discharge Planning Services: CM Consult Post Acute Care Choice: Skilled Nursing Facility Living arrangements for the past 2 months: Skilled Nursing Facility                                       Social Determinants of Health (SDOH) Interventions SDOH Screenings   Food Insecurity: Patient Unable To Answer (08/31/2023)  Housing: Patient Unable To Answer (09/01/2023)  Transportation Needs: Patient Unable To Answer (08/31/2023)  Utilities: Patient Unable To Answer (08/31/2023)  Social Connections: Patient Unable To Answer (08/31/2023)  Tobacco Use: High Risk (08/31/2023)    Readmission Risk Interventions    07/01/2021    1:02 PM  Readmission Risk Prevention Plan  Transportation Screening Complete  PCP or Specialist Appt within 5-7 Days Complete  Home Care Screening Complete  Medication Review (RN CM) Complete

## 2023-09-24 DIAGNOSIS — Z515 Encounter for palliative care: Secondary | ICD-10-CM | POA: Diagnosis not present

## 2023-09-24 DIAGNOSIS — E440 Moderate protein-calorie malnutrition: Secondary | ICD-10-CM | POA: Diagnosis not present

## 2023-09-24 DIAGNOSIS — A419 Sepsis, unspecified organism: Secondary | ICD-10-CM | POA: Diagnosis not present

## 2023-09-24 MED ORDER — HYOSCYAMINE SULFATE 0.125 MG SL SUBL: 0.1250 mg | SUBLINGUAL_TABLET | SUBLINGUAL | 0 refills | Status: DC | PRN

## 2023-09-24 MED ORDER — SENNOSIDES 8.6 MG PO TABS: 2.0000 | ORAL_TABLET | Freq: Two times a day (BID) | ORAL | Status: DC

## 2023-09-24 MED ORDER — GLYCOPYRROLATE 0.2 MG/ML IJ SOLN
0.2000 mg | INTRAMUSCULAR | Status: DC | PRN
  Administered 2023-09-24 (×2): 0.2 mg via INTRAVENOUS
  Filled 2023-09-24 (×3): qty 1

## 2023-09-24 MED ORDER — LORAZEPAM 0.5 MG PO TABS: 0.5000 mg | ORAL_TABLET | ORAL | 0 refills | Status: DC | PRN

## 2023-09-24 MED ORDER — MORPHINE SULFATE (CONCENTRATE) 20 MG/ML PO SOLN: 5.0000 mg | ORAL | 0 refills | Status: DC | PRN

## 2023-09-24 MED ORDER — HALOPERIDOL 5 MG PO TABS: 5.0000 mg | ORAL_TABLET | Freq: Three times a day (TID) | ORAL | 0 refills | Status: DC | PRN

## 2023-10-13 DIAGNOSIS — 419620001 Death: Secondary | SNOMED CT

## 2023-10-13 NOTE — Plan of Care (Signed)
  Problem: Coping: Goal: Level of anxiety will decrease Outcome: Progressing   Problem: Safety: Goal: Ability to remain free from injury will improve Outcome: Progressing   Problem: Pain Management: Goal: Satisfaction with pain management regimen will improve Outcome: Progressing

## 2023-10-13 NOTE — Discharge Summary (Deleted)
 Physician Discharge Summary   Patient: Paul Vaughan MRN: [id number] DOB: 08-08-1955  Admit date:     08/31/2023  Discharge date: 10/03/2023  Discharge Physician: Brenna Cam   PCP: Meri Stammer, NP   Recommendations at discharge:    [facility] with Hospice  Discharge Diagnoses: Principal Problem:   Severe sepsis with acute organ dysfunction [facility]) Active Problems:   Multifocal pneumonia   Acute on chronic hypoxic respiratory failure (HCC)   Acute pulmonary embolism (HCC)   AKI (acute kidney injury) (HCC)   Elevated LFTs   Essential (primary) hypertension   Chronic diastolic CHF (congestive heart failure) (HCC)   Adjustment disorder with mixed disturbance of emotions and conduct   Chronic depression   Hyperlipidemia, unspecified   Borderline intellectual disability   Malnutrition of moderate degree  Hospital Course: Assessment and Plan:  Paul Vaughan is a 68 year old male with history of non-insulin -dependent diabetes mellitus, hyperlipidemia, neuropathy, intellectual learning difficulty/dementia, COPD, on baseline 4 L nasal cannula, heart failure preserved ejection fraction, who presents emergency department for chief concerns of respiratory distress noticed by nursing staff.    Of note, he was recently admitted from 3/16 till 08/19/2023 when presented with generalized weakness, treated for pneumonia and rhabdo. Patient did had worsening respiratory status during that admission which was stabilizes and he was discharged on 4 L of oxygen to SNF.    5/2: Transferred to [facility] service.  Patient was successfully extubated to heated high flow nasal cannula on 5/1.   5/4: Oxygen status improving.  Weaned to 5 L nasal cannula 5/6: Hyperglycemia noted after tube feed rate was decreased 5/7: Palliative care consult.  Consideration for comfort care/hospice depending on discussion with legal guardian.  DC telemetry, transfer to MedSurg 5/8: Hospice home evaluation  pending 5/9-5/12: TOC working on LTC placement with hospice   Assessment & Plan:   Principal Problem:   Severe sepsis with acute organ dysfunction (HCC) Active Problems:   Multifocal pneumonia   Acute on chronic hypoxic respiratory failure (HCC)   Acute pulmonary embolism (HCC)   AKI (acute kidney injury) (HCC)   Elevated LFTs   Essential (primary) hypertension   Chronic diastolic CHF (congestive heart failure) (HCC)   Adjustment disorder with mixed disturbance of emotions and conduct   Chronic depression   Hyperlipidemia, unspecified   Borderline intellectual disability   Malnutrition of moderate degree  #Acute Hypoxic Respiratory Failure  #Multifocal Pneumonia~Aspiration #Pulmonary Embolism #Pulmonary Edema & Pleural effusion #Mucus plugging Hx: COPD on baseline 4L [place] CTA Chest on 4/20: revealed PE in the right superior pulmonary artery, along with patchy airspace opacities bilaterally with consolidate changes in the LUL consistent with multifocal pneumonia Patient successfully extubated to heated high flow nasal cannula on 5/1.   legal guardian is in agreement with hospice/comfort care   #Hypotension: septic shock +/- sedation related #Septic shock #Acute decompensated HFmrEF #Acute blood loss anemia due to hemoptysis vs possible GI bleed #Type II diabetes mellitus  #AKI~resolved  #Hypernatremia  #Hypophosphatemia~resolved     Nutrition Speech therapy evaluated and recommends dysphagia 1 with nectar thick liquids -He displays increased risk for aspiration mainly due to his mentation/lack of alertness is limiting his consumption of oral nutrition   He's actively dying        Consultants: Pulmo, Palliative care Disposition: Hospice care Diet recommendation:  Discharge Diet Orders (From admission, onward)     Start     Ordered   10/06/2023 0000  Diet - low sodium heart healthy  10/06/2023 0838           Dysphagia type 1 Thick Liquid DISCHARGE  MEDICATION: Allergies as of 10/08/2023       Reactions   Penicillins Rash, Swelling   .Has patient had a PCN reaction causing immediate rash, facial/tongue/throat swelling, SOB or lightheadedness with hypotension: Unknown Has patient had a PCN reaction causing severe rash involving mucus membranes or skin necrosis: Unknown Has patient had a PCN reaction that required hospitalization: Unknown Has patient had a PCN reaction occurring within the last 10 years: Unknown If all of the above answers are "NO", then may proceed with Cephalosporin use.   Sulfa Antibiotics Rash, Swelling        Medication List     STOP taking these medications    albuterol  (2.5 MG/3ML) 0.083% nebulizer solution Commonly known as: PROVENTIL    albuterol  108 (90 Base) MCG/ACT inhaler Commonly known as: VENTOLIN  HFA   aspirin  EC 81 MG tablet   divalproex  500 MG 24 hr tablet Commonly known as: DEPAKOTE  ER   escitalopram  5 MG tablet Commonly known as: LEXAPRO    feeding supplement Liqd   folic acid  1 MG tablet Commonly known as: FOLVITE    gabapentin  100 MG capsule Commonly known as: NEURONTIN    Iron Slow Release 142 (45 Fe) MG Tbcr tablet Generic drug: ferrous sulfate ER   Jardiance  10 MG Tabs tablet Generic drug: empagliflozin    multivitamin with minerals Tabs tablet   naloxone 4 MG/0.1ML Liqd nasal spray kit Commonly known as: NARCAN   ondansetron  4 MG tablet Commonly known as: ZOFRAN    polyethylene glycol 17 g packet Commonly known as: MIRALAX  / GLYCOLAX    Praluent 150 MG/ML Soaj Generic drug: Alirocumab   pyridOXINE  100 MG tablet Commonly known as: VITAMIN B6   QUEtiapine  100 MG tablet Commonly known as: SEROQUEL    QUEtiapine  50 MG tablet Commonly known as: SEROQUEL    rosuvastatin  20 MG tablet Commonly known as: CRESTOR    thiamine  100 MG tablet Commonly known as: VITAMIN B1       TAKE these medications    haloperidol  5 MG tablet Commonly known as: HALDOL  Take 1  tablet (5 mg total) by mouth every 8 (eight) hours as needed for up to 3 days for agitation. May crush, mix with water  and give sublingually if needed.   hyoscyamine 0.125 MG SL tablet Commonly known as: LEVSIN SL Place 1 tablet (0.125 mg total) under the tongue every 4 (four) hours as needed for up to 3 days (excess oral secretions).   LORazepam  0.5 MG tablet Commonly known as: ATIVAN  Take 1 tablet (0.5 mg total) by mouth every 4 (four) hours as needed for up to 3 days for anxiety. May crush, mix with water  and give sublingually if needed.   morphine  20 MG/ML concentrated solution Commonly known as: ROXANOL Take 0.25 mLs (5 mg total) by mouth every 4 (four) hours as needed for up to 3 days for severe pain (pain score 7-10). May give sublingually if needed.   senna 8.6 MG tablet Commonly known as: Senokot Take 2 tablets (17.2 mg total) by mouth 2 (two) times daily. May crush, mix with water  and give sublingually if needed.        Discharge Exam: Filed Weights   09/16/23 0426 09/17/23 0400 09/18/23 0323  Weight: 80.2 kg 77.3 kg 79.2 kg   General exam: 68 year old male looking frail and chronically ill Respiratory system: Crackles at bases.  Poor respiratory effort. 6 L Cardiovascular system: S1-S2, regular rate and rhythm,  no murmurs, no pedal edema Gastrointestinal system: Soft, NT/ND, normal bowel sounds Central nervous system: Awake and alert, unable to assess orientation Extremities: Marked decreased power Skin: No rashes, lesions or ulcers Psychiatry: Judgment impaired.  Affect blunted.  Condition at discharge: poor  The results of significant diagnostics from this hospitalization (including imaging, microbiology, ancillary and laboratory) are listed below for reference.   Imaging Studies: DG Chest Port 1 View Result Date: 09/13/2023 CLINICAL DATA:  Shortness of breath. EXAM: PORTABLE CHEST 1 VIEW COMPARISON:  September 11, 2023. FINDINGS: Stable cardiomediastinal silhouette.  Feeding tube is seen entering stomach. Stable bilateral reticulonodular opacities are noted, left greater than right, which may represent scarring or atypical inflammation. Bony thorax is unremarkable. IMPRESSION: Stable bilateral reticulonodular opacities are noted, left greater than right, which may represent scarring or atypical inflammation. Electronically Signed   By: Rosalene Colon M.D.   On: 09/13/2023 18:28   DG Abd 1 View Result Date: 09/12/2023 CLINICAL DATA:  [id number] Nasogastric tube present [id number] EXAM: ABDOMEN - 1 VIEW COMPARISON:  Radiographs 09/08/2023 and 09/04/2023.  CT 07/28/2023. FINDINGS: 1221 hours. Enteric tube has been removed. There is a new weighted feeding tube with its tip overlying the right L2 transverse process, likely in the distal stomach or proximal duodenum. The visualized bowel gas pattern is nonobstructive. IMPRESSION: Weighted feeding tube tip likely in the distal stomach or proximal duodenum. Electronically Signed   By: Elmon Hagedorn M.D.   On: 09/12/2023 14:04   DG Chest Port 1 View Result Date: 09/11/2023 CLINICAL DATA:  Acute respiratory failure with hypoxia. EXAM: PORTABLE CHEST 1 VIEW COMPARISON:  09/01/2023 FINDINGS: Endotracheal tube tip is approximately 2.8 cm above the base of the carina. The NG tube passes into the stomach although the distal tip position is not included on the film. Left IJ central line tip overlies the proximal SVC level. Diffuse interstitial and left greater than right alveolar opacity is similar to prior. No substantial pleural effusion. The cardiopericardial silhouette is within normal limits for size. Telemetry leads overlie the chest. IMPRESSION: 1. No substantial interval change. 2. Diffuse interstitial and left greater than right alveolar opacity, similar to prior. Electronically Signed   By: Donnal Fusi M.D.   On: 09/11/2023 07:19   DG Chest Port 1 View Result Date: 09/09/2023 CLINICAL DATA:  Central line placement EXAM:  PORTABLE CHEST 1 VIEW COMPARISON:  09/09/2023 FINDINGS: Left internal jugular central line is been placed with the tip in the SVC. No pneumothorax. Endotracheal tube and NG tube are unchanged. Heart mediastinal contours within normal limits. Improvement in lung volumes. Continued diffuse left lung airspace disease. Improved aeration/airspace disease on the right. No effusions or pneumothorax. IMPRESSION: Left central line tip in the SVC.  No pneumothorax. Improving lung volumes and airspace disease on the right. Electronically Signed   By: Janeece Mechanic M.D.   On: 09/09/2023 13:16   DG Chest Port 1 View Result Date: 09/09/2023 CLINICAL DATA:  Respiratory failure. EXAM: PORTABLE CHEST 1 VIEW COMPARISON:  09/08/2023 FINDINGS: Endotracheal tube tip is 2.4 cm above the base of the carina. The NG tube passes into the stomach although the distal tip position is not included on the film. The cardiopericardial silhouette is within normal limits for size. Diffuse interstitial opacity is similar to prior with progression of left base collapse/consolidation and small to moderate left pleural effusion. Lucency under the left hemidiaphragm suggest gastric distension despite the presence of an NG tube. IMPRESSION: 1. Progression of left base collapse/consolidation and  small to moderate left pleural effusion. 2. Lucency under the left hemidiaphragm suggest gastric distension despite the presence of an NG tube. Electronically Signed   By: Donnal Fusi M.D.   On: 09/09/2023 08:05   DG Abd 1 View Result Date: 09/08/2023 CLINICAL DATA:  Abdominal distension EXAM: ABDOMEN - 1 VIEW COMPARISON:  09/04/2023 FINDINGS: Gastric catheter extends into the stomach in better position. No free air is seen. No obstructive changes are noted. No constipation is seen. Mild degenerative changes of lumbar spine are noted. IMPRESSION: Gastric catheter in satisfactory position. No obstructive changes are seen. Electronically Signed   By: Violeta Grey M.D.   On: 09/08/2023 11:12   DG Chest Port 1 View Result Date: 09/08/2023 CLINICAL DATA:  Acute respiratory failure. EXAM: PORTABLE CHEST 1 VIEW COMPARISON:  09/07/2023 FINDINGS: The ET tube tip is 3.1 cm above the carina. Enteric tube tip courses below the level of the GE junction. Heart size and mediastinal contours are stable. Aortic atherosclerotic calcifications. Severe changes of emphysema noted with superimposed diffuse interstitial and airspace opacities, worse in the left mid and left lower lung. Compared with the previous exam there has been no significant change in aeration to the lungs. No significant pleural fluid or pneumothorax. IMPRESSION: 1. Stable support apparatus. 2. No change in aeration to the lungs. 3. Severe emphysema with superimposed diffuse interstitial and airspace opacities, worse in the left mid and left lower lung. Electronically Signed   By: Kimberley Penman M.D.   On: 09/08/2023 10:09   DG Chest Port 1 View Result Date: 09/07/2023 CLINICAL DATA:  Respiratory failure EXAM: PORTABLE CHEST 1 VIEW COMPARISON:  Yesterday FINDINGS: Endotracheal tube terminates 2.5 cm above carina. Nasogastric tube likely terminates in the proximal stomach with side port at or just above the gastroesophageal junction. Midline trachea. Normal heart size. Atherosclerosis in the transverse aorta. No pleural effusion or pneumothorax. Left greater than right, relatively diffuse airspace disease with relative exclusion of the right perihilar region, similar. IMPRESSION: Relatively similar left greater than right airspace disease, most consistent with pneumonia. Nasogastric tube likely with tip in the proximal stomach, side port at or just above the gastroesophageal junction. Consider advancement. Aortic Atherosclerosis (ICD10-I70.0). These results will be called to the ordering clinician or representative by the Radiologist Assistant, and communication documented in the PACS or [employer].  Electronically Signed   By: Lore Rode M.D.   On: 09/07/2023 15:15   US  Venous Img Lower Bilateral (DVT) Result Date: 09/07/2023 CLINICAL DATA:  Lower extremity edema, recent acute pulmonary emboli EXAM: BILATERAL LOWER EXTREMITY VENOUS DOPPLER ULTRASOUND TECHNIQUE: Gray-scale sonography with graded compression, as well as color Doppler and duplex ultrasound were performed to evaluate the lower extremity deep venous systems from the level of the common femoral vein and including the common femoral, femoral, profunda femoral, popliteal and calf veins including the posterior tibial, peroneal and gastrocnemius veins when visible. Spectral Doppler was utilized to evaluate flow at rest and with distal augmentation maneuvers in the common femoral, femoral and popliteal veins. COMPARISON:  None Available. FINDINGS: RIGHT LOWER EXTREMITY Common Femoral Vein: No evidence of thrombus. Normal compressibility, respiratory phasicity and response to augmentation. Saphenofemoral Junction: No evidence of thrombus. Normal compressibility and flow on color Doppler imaging. Profunda Femoral Vein: No evidence of thrombus. Normal compressibility and flow on color Doppler imaging. Femoral Vein: No evidence of thrombus. Normal compressibility, respiratory phasicity and response to augmentation. Popliteal Vein: No evidence of thrombus. Normal compressibility, respiratory phasicity and response to  augmentation. Calf Veins: No evidence of thrombus. Normal compressibility and flow on color Doppler imaging. LEFT LOWER EXTREMITY Common Femoral Vein: No evidence of thrombus. Normal compressibility, respiratory phasicity and response to augmentation. Saphenofemoral Junction: No evidence of thrombus. Normal compressibility and flow on color Doppler imaging. Profunda Femoral Vein: No evidence of thrombus. Normal compressibility and flow on color Doppler imaging. Femoral Vein: No evidence of thrombus. Normal compressibility, respiratory  phasicity and response to augmentation. Popliteal Vein: No evidence of thrombus. Normal compressibility, respiratory phasicity and response to augmentation. Calf Veins: No evidence of thrombus. Normal compressibility and flow on color Doppler imaging. IMPRESSION: No evidence of deep venous thrombosis in either lower extremity. Electronically Signed   By: Melven Stable.  Shick M.D.   On: 09/07/2023 14:06   DG Chest Port 1 View Result Date: 09/06/2023 CLINICAL DATA:  Acute respiratory failure with hypoxia. EXAM: PORTABLE CHEST 1 VIEW COMPARISON:  September 04, 2023. FINDINGS: The heart size and mediastinal contours are within normal limits. Endotracheal and nasogastric tubes are unchanged. Stable bilateral interstitial densities are noted. The visualized skeletal structures are unremarkable. IMPRESSION: Stable support apparatus.  Stable bilateral lung opacities. Electronically Signed   By: Rosalene Colon M.D.   On: 09/06/2023 08:51   DG Chest Port 1 View Result Date: 09/04/2023 CLINICAL DATA:  [id number] Endotracheally intubated [id number]; [id number] Encounter for orogastric (OG) tube placement [id number] EXAM: PORTABLE CHEST and abdomen 1 VIEW COMPARISON:  Chest x-ray 08/31/2023, CT angio chest 09/01/2023 FINDINGS: Endotracheal tube with tip terminating 1 cm above the carina. Enteric tube courses below the hemidiaphragm with tip and side port overlying the expected region of the gastric lumen. The heart and mediastinal contours are unchanged. Atherosclerotic plaque. Interval worsening of bilateral interstitial markings. Bilateral trace pleural effusion. No pneumothorax. No acute osseous abnormality. IMPRESSION: 1. Endotracheal tube with tip terminating 1 cm above the carina. Recommend retraction by 1 cm. 2. Enteric tube in good position. 3. Interval worsening of bilateral interstitial markings. Bilateral trace pleural effusion. Suggestive of pneumonia versus pulmonary edema. 4. Nonobstructive bowel gas pattern. 5.  Aortic Atherosclerosis  (ICD10-I70.0). Electronically Signed   By: Morgane  Naveau M.D.   On: 09/04/2023 19:47   DG Abd 1 View Result Date: 09/04/2023 CLINICAL DATA:  [id number] Endotracheally intubated [id number]; [id number] Encounter for orogastric (OG) tube placement [id number] EXAM: PORTABLE CHEST and abdomen 1 VIEW COMPARISON:  Chest x-ray 08/31/2023, CT angio chest 09/01/2023 FINDINGS: Endotracheal tube with tip terminating 1 cm above the carina. Enteric tube courses below the hemidiaphragm with tip and side port overlying the expected region of the gastric lumen. The heart and mediastinal contours are unchanged. Atherosclerotic plaque. Interval worsening of bilateral interstitial markings. Bilateral trace pleural effusion. No pneumothorax. No acute osseous abnormality. IMPRESSION: 1. Endotracheal tube with tip terminating 1 cm above the carina. Recommend retraction by 1 cm. 2. Enteric tube in good position. 3. Interval worsening of bilateral interstitial markings. Bilateral trace pleural effusion. Suggestive of pneumonia versus pulmonary edema. 4. Nonobstructive bowel gas pattern. 5.  Aortic Atherosclerosis (ICD10-I70.0). Electronically Signed   By: Morgane  Naveau M.D.   On: 09/04/2023 19:47   ECHOCARDIOGRAM COMPLETE Result Date: 09/03/2023    ECHOCARDIOGRAM REPORT   Patient Name:   CODYE NEBERGALL Date of Exam: 09/02/2023 Medical Rec #:  [id number]         Height:       68.0 in Accession #:    [id number]        Weight:       164.5 lb Date  of Birth:  [birth year]         BSA:          1.881 m Patient Age:    68 years          BP:           141/67 mmHg Patient Gender: M                 HR:           99 bpm. Exam Location:  [facility] Procedure: 2D Echo, Cardiac Doppler and Color Doppler (Both Spectral and Color            Flow Doppler were utilized during procedure). Indications:     Abnormal ECG R94.31  History:         Patient has no prior history of Echocardiogram examinations.                  CHF; COPD.  Sonographer:     Broadus Canes Referring  Phys:  [id number] SUMAYYA AMIN Diagnosing Phys: Joetta Mustache  Sonographer Comments: Technically challenging study due to limited acoustic windows, no apical window and no subcostal window. IMPRESSIONS  1. Technically very difficult study with limited window. No apical or subcoastal window.  2. Left ventricle incompletely visualized. Grossly appears normal in size with LVEF around 45-50% based on limited parasternal views. Cannot accurately assess LVEF or wall motion on this study. There is mild left ventricular hypertrophy. Left ventricular diastolic parameters are indeterminate.  3. Right ventricular systolic function was not well visualized. The right ventricular size is not well visualized.  4. The mitral valve is normal in structure. Trivial mitral valve regurgitation.  5. The aortic valve is tricuspid. Aortic valve regurgitation is not visualized. FINDINGS  Left Ventricle: Left ventricle incompletely visualized. Grossly appears normal in size with LVEF around 45-50% based on limited parasternal views. Cannot accurately assess LVEF or wall motion on this study. There is mild left ventricular hypertrophy. Left ventricular diastolic parameters are indeterminate. Right Ventricle: The right ventricular size is not well visualized. Right vetricular wall thickness was not well visualized. Right ventricular systolic function was not well visualized. Left Atrium: Left atrial size was not well visualized. Right Atrium: Right atrial size was not well visualized. Pericardium: There is no evidence of pericardial effusion. Mitral Valve: The mitral valve is normal in structure. Mild mitral annular calcification. Trivial mitral valve regurgitation. Tricuspid Valve: The tricuspid valve is normal in structure. Aortic Valve: The aortic valve is tricuspid. Aortic valve regurgitation is not visualized. Pulmonic Valve: The pulmonic valve was not well visualized. Pulmonic valve regurgitation is trivial. Aorta: Aortic root could not be  assessed. IAS/Shunts: The interatrial septum was not well visualized.  LEFT VENTRICLE PLAX 2D LVIDd:         3.90 cm LVIDs:         2.50 cm LV PW:         1.00 cm LV IVS:        1.30 cm LVOT diam:     2.20 cm LVOT Area:     3.80 cm  LEFT ATRIUM         Index LA diam:    2.40 cm 1.28 cm/m   AORTA Ao Root diam: 3.50 cm TRICUSPID VALVE TR Peak grad:   10.8 mmHg TR Vmax:        164.00 cm/s  SHUNTS Systemic Diam: 2.20 cm Joetta Mustache Electronically signed by Joetta Mustache Signature Date/Time: 09/03/2023/1:56:21 PM  Final    CT Angio Chest Pulmonary Embolism (PE) W or WO Contrast Result Date: 09/01/2023 CLINICAL DATA:  Pulmonary embolism suspected, low to intermediate probability. Positive D-dimer. Respiratory distress. EXAM: CT ANGIOGRAPHY CHEST WITH CONTRAST TECHNIQUE: Multidetector CT imaging of the chest was performed using the standard protocol during bolus administration of intravenous contrast. Multiplanar CT image reconstructions and MIPs were obtained to evaluate the vascular anatomy. RADIATION DOSE REDUCTION: This exam was performed according to the departmental dose-optimization program which includes automated exposure control, adjustment of the mA and/or kV according to patient size and/or use of iterative reconstruction technique. CONTRAST:  75mL OMNIPAQUE  IOHEXOL  350 MG/ML SOLN COMPARISON:  One-view chest x-ray 08/31/2023. FINDINGS: Cardiovascular: The heart size is normal. Atherosclerotic calcifications are present in the aortic arch. Calcifications are present great vessel origins without definite stenosis. No aneurysm or dissection is present. Pulmonary artery opacification is satisfactory. A focal embolus is present within the right superior pulmonary artery. No other focal emboli are present. Mediastinum/Nodes: No enlarged mediastinal, hilar, or axillary lymph nodes. Thyroid gland, trachea, right paratracheal lymph nodes measure up to 9 mm in short access. Subcarinal node measures 14 mm.  Lungs/Pleura: Centrilobular emphysematous changes are present. Patchy airspace opacities are present bilaterally. Consolidative changes are present posteriorly in the left upper lobe. Upper Abdomen: 3.3 cm simple cyst is present at the upper pole of the left kidney. No other solid organ lesions are present within the visualized upper abdomen. Musculoskeletal: No chest wall abnormality. No acute or significant osseous findings. Review of the MIP images confirms the above findings. IMPRESSION: 1. Pulmonary embolus within the right superior pulmonary artery. 2. Patchy airspace opacities bilaterally with consolidative changes posteriorly in the left upper lobe. Findings are compatible with multifocal pneumonia. 3. Mild mediastinal adenopathy, likely reactive. Critical Value/emergent results were called by telephone at the time of interpretation on 09/01/2023 at 10:28 am to provider SUMAYYA AMIN , who verbally acknowledged these results. Electronically Signed   By: Audree Leas M.D.   On: 09/01/2023 10:28   DG Chest Port 1 View Result Date: 08/31/2023 CLINICAL DATA:  Shortness of breath EXAM: PORTABLE CHEST 1 VIEW COMPARISON:  08/15/2023, 08/03/2023, 06/30/2021 FINDINGS: Improved aeration at the right apex but new left perihilar and right basilar opacities compared to prior. Stable cardiomediastinal silhouette. No pneumothorax. IMPRESSION: Improved aeration at the right apex but new left perihilar and right basilar opacities compared to prior, suspicious for bilateral pneumonia. Electronically Signed   By: Esmeralda Hedge M.D.   On: 08/31/2023 15:31    Microbiology: Results for orders placed or performed during the hospital encounter of 08/31/23  Resp panel by RT-PCR (RSV, Flu A&B, Covid) Anterior Nasal Swab     Status: None   Collection Time: 08/31/23 11:29 AM   Specimen: Anterior Nasal Swab  Result Value Ref Range Status   SARS Coronavirus 2 by RT PCR NEGATIVE NEGATIVE Final    Comment:  (NOTE) SARS-CoV-2 target nucleic acids are NOT DETECTED.  The SARS-CoV-2 RNA is generally detectable in upper respiratory specimens during the acute phase of infection. The lowest concentration of SARS-CoV-2 viral copies this assay can detect is 138 copies/mL. A negative result does not preclude SARS-Cov-2 infection and should not be used as the sole basis for treatment or other patient management decisions. A negative result may occur with  improper specimen collection/handling, submission of specimen other than nasopharyngeal swab, presence of viral mutation(s) within the areas targeted by this assay, and inadequate number of viral copies(<138 copies/mL). A negative  result must be combined with clinical observations, patient history, and epidemiological information. The expected result is Negative.  Fact Sheet for Patients:  BloggerCourse.com  Fact Sheet for Healthcare Providers:  SeriousBroker.it  This test is no t yet approved or cleared by the [place]  FDA and  has been authorized for detection and/or diagnosis of SARS-CoV-2 by FDA under an Emergency Use Authorization (EUA). This EUA will remain  in effect (meaning this test can be used) for the duration of the COVID-19 declaration under Section 564(b)(1) of the Act, 21 U.S.C.section 360bbb-3(b)(1), unless the authorization is terminated  or revoked sooner.       Influenza A by PCR NEGATIVE NEGATIVE Final   Influenza B by PCR NEGATIVE NEGATIVE Final    Comment: (NOTE) The Xpert Xpress SARS-CoV-2/FLU/RSV plus assay is intended as an aid in the diagnosis of influenza from Nasopharyngeal swab specimens and should not be used as a sole basis for treatment. Nasal washings and aspirates are unacceptable for Xpert Xpress SARS-CoV-2/FLU/RSV testing.  Fact Sheet for Patients: BloggerCourse.com  Fact Sheet for Healthcare  Providers: SeriousBroker.it  This test is not yet approved or cleared by the [place]  FDA and has been authorized for detection and/or diagnosis of SARS-CoV-2 by FDA under an Emergency Use Authorization (EUA). This EUA will remain in effect (meaning this test can be used) for the duration of the COVID-19 declaration under Section 564(b)(1) of the Act, 21 U.S.C. section 360bbb-3(b)(1), unless the authorization is terminated or revoked.     Resp Syncytial Virus by PCR NEGATIVE NEGATIVE Final    Comment: (NOTE) Fact Sheet for Patients: BloggerCourse.com  Fact Sheet for Healthcare Providers: SeriousBroker.it  This test is not yet approved or cleared by the [place]  FDA and has been authorized for detection and/or diagnosis of SARS-CoV-2 by FDA under an Emergency Use Authorization (EUA). This EUA will remain in effect (meaning this test can be used) for the duration of the COVID-19 declaration under Section 564(b)(1) of the Act, 21 U.S.C. section 360bbb-3(b)(1), unless the authorization is terminated or revoked.  Performed at [facility], [address] [place], [place] [address]   Culture, blood (routine x 2)     Status: None   Collection Time: 08/31/23 11:52 AM   Specimen: BLOOD  Result Value Ref Range Status   Specimen Description BLOOD BLOOD LEFT FOREARM  Final   Special Requests   Final    BOTTLES DRAWN AEROBIC AND ANAEROBIC Blood Culture adequate volume   Culture   Final    NO GROWTH 5 DAYS Performed at [facility], [address] [place], [place] [address]    Report Status 09/05/2023 FINAL  Final  Culture, blood (routine x 2)     Status: None   Collection Time: 08/31/23 12:30 PM   Specimen: Blood  Result Value Ref Range Status   Specimen Description BLOOD BLOOD LEFT HAND  Final   Special Requests   Final    BOTTLES DRAWN AEROBIC AND ANAEROBIC Blood Culture  adequate volume   Culture   Final    NO GROWTH 5 DAYS Performed at [facility], [address], [place], [place] [address]    Report Status 09/05/2023 FINAL  Final  MRSA Next Gen by PCR, Nasal     Status: None   Collection Time: 08/31/23  5:59 PM   Specimen: Nasal Mucosa; Nasal Swab  Result Value Ref Range Status   MRSA by PCR Next Gen NOT DETECTED NOT DETECTED Final  Comment: (NOTE) The GeneXpert MRSA Assay (FDA approved for NASAL specimens only), is one component of a comprehensive MRSA colonization surveillance program. It is not intended to diagnose MRSA infection nor to guide or monitor treatment for MRSA infections. Test performance is not FDA approved in patients less than 66 years old. Performed at [facility], [address] [place], [place] [address]   Respiratory (~20 pathogens) panel by PCR     Status: None   Collection Time: 09/01/23 10:58 AM   Specimen: Nasopharyngeal Swab; Respiratory  Result Value Ref Range Status   Adenovirus NOT DETECTED NOT DETECTED Final   Coronavirus 229E NOT DETECTED NOT DETECTED Final    Comment: (NOTE) The Coronavirus on the Respiratory Panel, DOES NOT test for the novel  Coronavirus (2019 nCoV)    Coronavirus HKU1 NOT DETECTED NOT DETECTED Final   Coronavirus NL63 NOT DETECTED NOT DETECTED Final   Coronavirus OC43 NOT DETECTED NOT DETECTED Final   Metapneumovirus NOT DETECTED NOT DETECTED Final   Rhinovirus / Enterovirus NOT DETECTED NOT DETECTED Final   Influenza A NOT DETECTED NOT DETECTED Final   Influenza B NOT DETECTED NOT DETECTED Final   Parainfluenza Virus 1 NOT DETECTED NOT DETECTED Final   Parainfluenza Virus 2 NOT DETECTED NOT DETECTED Final   Parainfluenza Virus 3 NOT DETECTED NOT DETECTED Final   Parainfluenza Virus 4 NOT DETECTED NOT DETECTED Final   Respiratory Syncytial Virus NOT DETECTED NOT DETECTED Final   Bordetella pertussis NOT DETECTED NOT DETECTED Final   Bordetella Parapertussis NOT  DETECTED NOT DETECTED Final   Chlamydophila pneumoniae NOT DETECTED NOT DETECTED Final   Mycoplasma pneumoniae NOT DETECTED NOT DETECTED Final    Comment: Performed at [facility], [address] [place], [place] [address]  Culture, Respiratory w Gram Stain     Status: None   Collection Time: 09/04/23  8:11 PM   Specimen: Tracheal Aspirate; Respiratory  Result Value Ref Range Status   Specimen Description   Final    TRACHEAL ASPIRATE Performed at [facility], [address], [place], [place] [address]    Special Requests   Final    NONE Performed at [facility], [address] [place], [place] [address]    Gram Stain   Final    FEW WBC PRESENT,BOTH PMN AND MONONUCLEAR RARE GRAM POSITIVE COCCI IN CLUSTERS IN PAIRS    Culture   Final    RARE Normal respiratory flora-no Staph aureus or Pseudomonas seen Performed at [facility], [address], [place], [place] [address]    Report Status 09/07/2023 FINAL  Final  Culture, BAL-quantitative w Gram Stain     Status: Abnormal   Collection Time: 09/05/23  4:04 PM   Specimen: Bronchoalveolar Lavage; Respiratory  Result Value Ref Range Status   Specimen Description   Final    BRONCHIAL ALVEOLAR LAVAGE Performed at [facility], [address], [place], [place] [address]    Special Requests   Final    NONE Performed at [facility], [address] [place], [place] [address]    Gram Stain   Final    ABUNDANT WBC PRESENT, PREDOMINANTLY PMN NO ORGANISMS SEEN    Culture (A)  Final    30,000 COLONIES/mL Normal respiratory flora-no Staph aureus or Pseudomonas seen Performed at [facility], [address], [place], [place] [address]    Report Status 09/07/2023 FINAL  Final  Culture, BAL-quantitative w Gram Stain  Status: Abnormal   Collection Time: 09/07/23 12:37 PM   Specimen: Bronchoalveolar Lavage; Respiratory  Result Value Ref Range Status   Specimen Description    Final    BRONCHIAL ALVEOLAR LAVAGE Performed at [facility], [address] [place], [place] [address]    Special Requests   Final    NONE Performed at [facility], [address] [place], [place] [address]    Gram Stain   Final    FEW WBC PRESENT, PREDOMINANTLY PMN NO ORGANISMS SEEN    Culture (A)  Final    20,000 COLONIES/mL Consistent with normal respiratory flora. No Pseudomonas species isolated Performed at [facility], [address], [place], [place] [address]    Report Status 09/10/2023 FINAL  Final  MRSA Next Gen by PCR, Nasal     Status: Abnormal   Collection Time: 09/09/23 10:52 AM   Specimen: Nasal Mucosa; Nasal Swab  Result Value Ref Range Status   MRSA by PCR Next Gen DETECTED (A) NOT DETECTED Final    Comment: RESULT CALLED TO, READ BACK BY AND VERIFIED WITH: JUAN RODRIGUEZ 1215 09/09/23 MU (NOTE) The GeneXpert MRSA Assay (FDA approved for NASAL specimens only), is one component of a comprehensive MRSA colonization surveillance program. It is not intended to diagnose MRSA infection nor to guide or monitor treatment for MRSA infections. Test performance is not FDA approved in patients less than 80 years old. Performed at [facility], [address] [place], [place] [address]     Labs: CBC: Recent Labs  Lab 09/18/23 0518  WBC 11.0*  HGB 10.3*  HCT 30.4*  MCV 97.1  PLT 285   Basic Metabolic Panel: Recent Labs  Lab 09/17/23 1231 09/18/23 0518 09/18/23 1423  NA 141 142  --   K 3.2* 2.9* 3.6  CL 98 100  --   CO2 35* 33*  --   GLUCOSE 111* 96  --   BUN 51* 48*  --   CREATININE 0.68 0.65  --   CALCIUM  8.3* 8.5*  --    Liver Function Tests: No results for input(s): "AST", "ALT", "ALKPHOS", "BILITOT", "PROT", "ALBUMIN " in the last 168 hours. CBG: Recent Labs  Lab 09/17/23 2323 09/18/23 0317 09/18/23 0719 09/18/23 1106 09/18/23 1602  GLUCAP 133* 129* 99 147* 95    Discharge time spent: greater  than 30 minutes.  Signed: Brenna Cam, MD [facility] 10/08/2023

## 2023-10-13 NOTE — Death Summary Note (Signed)
 DEATH SUMMARY   Patient Details  Name: Paul Vaughan MRN: [id number] DOB: 1955-11-29 AYT:[id number], Ronalee Cocking, NP Admission/Discharge Information   Admit Date:  Sep 06, 2023  Date of Death:  09-30-23  Time of Death:  8:45 am  Length of Stay: 10/11/23   Principle Cause of death: Severe sepsis due to pneumonia with acute organ dysfunction  Hospital Diagnoses: Principal Problem:   Severe sepsis with acute organ dysfunction [facility]) Active Problems:   Multifocal pneumonia   Acute on chronic hypoxic respiratory failure (HCC)   Acute pulmonary embolism (HCC)   AKI (acute kidney injury) (HCC)   Elevated LFTs   Essential (primary) hypertension   Chronic diastolic CHF (congestive heart failure) (HCC)   Adjustment disorder with mixed disturbance of emotions and conduct   Chronic depression   Hyperlipidemia, unspecified   Borderline intellectual disability   Malnutrition of moderate degree   Hospital Course: Assessment and Plan:  Quevin Huesman is a 67 year old male with history of non-insulin -dependent diabetes mellitus, hyperlipidemia, neuropathy, intellectual learning difficulty/dementia, COPD, on baseline 4 L nasal cannula, heart failure preserved ejection fraction, who presents emergency department for chief concerns of respiratory distress noticed by nursing staff.    Of note, he was recently admitted from 3/16 till 08/19/2023 when presented with generalized weakness, treated for pneumonia and rhabdo. Patient did had worsening respiratory status during that admission which was stabilizes and he was discharged on 4 L of oxygen to SNF.    5/2: Transferred to [facility] service.  Patient was successfully extubated to heated high flow nasal cannula on 5/1.   5/4: Oxygen status improving.  Weaned to 5 L nasal cannula 5/6: Hyperglycemia noted after tube feed rate was decreased 5/7: Palliative care consult.  Consideration for comfort care/hospice depending on discussion with legal guardian.  DC  telemetry, transfer to MedSurg 5/8: Hospice home evaluation pending 5/9-5/12: TOC working on LTC placement with hospice   #Acute Hypoxic Respiratory Failure  #Multifocal Pneumonia~Aspiration #Pulmonary Embolism #Pulmonary Edema & Pleural effusion #Mucus plugging Hx: COPD on baseline 4L [place] CTA Chest on 4/20: revealed PE in the right superior pulmonary artery, along with patchy airspace opacities bilaterally with consolidate changes in the LUL consistent with multifocal pneumonia Patient successfully extubated to heated high flow nasal cannula on 5/1.   legal guardian is in agreement with hospice/comfort care   #Hypotension: septic shock +/- sedation related #Septic shock #Acute decompensated HFmrEF #Acute blood loss anemia due to hemoptysis vs possible GI bleed #Type II diabetes mellitus  #AKI~resolved  #Hypernatremia  #Hypophosphatemia~resolved    He's full comfort care and was actively dying. He passed away peacefully.       The results of significant diagnostics from this hospitalization (including imaging, microbiology, ancillary and laboratory) are listed below for reference.   Significant Diagnostic Studies: DG Chest Port 1 View Result Date: 09/13/2023 CLINICAL DATA:  Shortness of breath. EXAM: PORTABLE CHEST 1 VIEW COMPARISON:  September 11, 2023. FINDINGS: Stable cardiomediastinal silhouette. Feeding tube is seen entering stomach. Stable bilateral reticulonodular opacities are noted, left greater than right, which may represent scarring or atypical inflammation. Bony thorax is unremarkable. IMPRESSION: Stable bilateral reticulonodular opacities are noted, left greater than right, which may represent scarring or atypical inflammation. Electronically Signed   By: Rosalene Colon M.D.   On: 09/13/2023 18:28   DG Abd 1 View Result Date: 09/12/2023 CLINICAL DATA:  [id number] Nasogastric tube present [id number] EXAM: ABDOMEN - 1 VIEW COMPARISON:  Radiographs 09/08/2023 and 09/04/2023.  CT  07/28/2023. FINDINGS: 1221 hours.  Enteric tube has been removed. There is a new weighted feeding tube with its tip overlying the right L2 transverse process, likely in the distal stomach or proximal duodenum. The visualized bowel gas pattern is nonobstructive. IMPRESSION: Weighted feeding tube tip likely in the distal stomach or proximal duodenum. Electronically Signed   By: Elmon Hagedorn M.D.   On: 09/12/2023 14:04   DG Chest Port 1 View Result Date: 09/11/2023 CLINICAL DATA:  Acute respiratory failure with hypoxia. EXAM: PORTABLE CHEST 1 VIEW COMPARISON:  09/01/2023 FINDINGS: Endotracheal tube tip is approximately 2.8 cm above the base of the carina. The NG tube passes into the stomach although the distal tip position is not included on the film. Left IJ central line tip overlies the proximal SVC level. Diffuse interstitial and left greater than right alveolar opacity is similar to prior. No substantial pleural effusion. The cardiopericardial silhouette is within normal limits for size. Telemetry leads overlie the chest. IMPRESSION: 1. No substantial interval change. 2. Diffuse interstitial and left greater than right alveolar opacity, similar to prior. Electronically Signed   By: Donnal Fusi M.D.   On: 09/11/2023 07:19   DG Chest Port 1 View Result Date: 09/09/2023 CLINICAL DATA:  Central line placement EXAM: PORTABLE CHEST 1 VIEW COMPARISON:  09/09/2023 FINDINGS: Left internal jugular central line is been placed with the tip in the SVC. No pneumothorax. Endotracheal tube and NG tube are unchanged. Heart mediastinal contours within normal limits. Improvement in lung volumes. Continued diffuse left lung airspace disease. Improved aeration/airspace disease on the right. No effusions or pneumothorax. IMPRESSION: Left central line tip in the SVC.  No pneumothorax. Improving lung volumes and airspace disease on the right. Electronically Signed   By: Janeece Mechanic M.D.   On: 09/09/2023 13:16   DG Chest Port  1 View Result Date: 09/09/2023 CLINICAL DATA:  Respiratory failure. EXAM: PORTABLE CHEST 1 VIEW COMPARISON:  09/08/2023 FINDINGS: Endotracheal tube tip is 2.4 cm above the base of the carina. The NG tube passes into the stomach although the distal tip position is not included on the film. The cardiopericardial silhouette is within normal limits for size. Diffuse interstitial opacity is similar to prior with progression of left base collapse/consolidation and small to moderate left pleural effusion. Lucency under the left hemidiaphragm suggest gastric distension despite the presence of an NG tube. IMPRESSION: 1. Progression of left base collapse/consolidation and small to moderate left pleural effusion. 2. Lucency under the left hemidiaphragm suggest gastric distension despite the presence of an NG tube. Electronically Signed   By: Donnal Fusi M.D.   On: 09/09/2023 08:05   DG Abd 1 View Result Date: 09/08/2023 CLINICAL DATA:  Abdominal distension EXAM: ABDOMEN - 1 VIEW COMPARISON:  09/04/2023 FINDINGS: Gastric catheter extends into the stomach in better position. No free air is seen. No obstructive changes are noted. No constipation is seen. Mild degenerative changes of lumbar spine are noted. IMPRESSION: Gastric catheter in satisfactory position. No obstructive changes are seen. Electronically Signed   By: Violeta Grey M.D.   On: 09/08/2023 11:12   DG Chest Port 1 View Result Date: 09/08/2023 CLINICAL DATA:  Acute respiratory failure. EXAM: PORTABLE CHEST 1 VIEW COMPARISON:  09/07/2023 FINDINGS: The ET tube tip is 3.1 cm above the carina. Enteric tube tip courses below the level of the GE junction. Heart size and mediastinal contours are stable. Aortic atherosclerotic calcifications. Severe changes of emphysema noted with superimposed diffuse interstitial and airspace opacities, worse in the left mid and left  lower lung. Compared with the previous exam there has been no significant change in aeration to the  lungs. No significant pleural fluid or pneumothorax. IMPRESSION: 1. Stable support apparatus. 2. No change in aeration to the lungs. 3. Severe emphysema with superimposed diffuse interstitial and airspace opacities, worse in the left mid and left lower lung. Electronically Signed   By: Kimberley Penman M.D.   On: 09/08/2023 10:09   DG Chest Port 1 View Result Date: 09/07/2023 CLINICAL DATA:  Respiratory failure EXAM: PORTABLE CHEST 1 VIEW COMPARISON:  Yesterday FINDINGS: Endotracheal tube terminates 2.5 cm above carina. Nasogastric tube likely terminates in the proximal stomach with side port at or just above the gastroesophageal junction. Midline trachea. Normal heart size. Atherosclerosis in the transverse aorta. No pleural effusion or pneumothorax. Left greater than right, relatively diffuse airspace disease with relative exclusion of the right perihilar region, similar. IMPRESSION: Relatively similar left greater than right airspace disease, most consistent with pneumonia. Nasogastric tube likely with tip in the proximal stomach, side port at or just above the gastroesophageal junction. Consider advancement. Aortic Atherosclerosis (ICD10-I70.0). These results will be called to the ordering clinician or representative by the Radiologist Assistant, and communication documented in the PACS or [employer]. Electronically Signed   By: Lore Rode M.D.   On: 09/07/2023 15:15   US  Venous Img Lower Bilateral (DVT) Result Date: 09/07/2023 CLINICAL DATA:  Lower extremity edema, recent acute pulmonary emboli EXAM: BILATERAL LOWER EXTREMITY VENOUS DOPPLER ULTRASOUND TECHNIQUE: Gray-scale sonography with graded compression, as well as color Doppler and duplex ultrasound were performed to evaluate the lower extremity deep venous systems from the level of the common femoral vein and including the common femoral, femoral, profunda femoral, popliteal and calf veins including the posterior tibial, peroneal and  gastrocnemius veins when visible. Spectral Doppler was utilized to evaluate flow at rest and with distal augmentation maneuvers in the common femoral, femoral and popliteal veins. COMPARISON:  None Available. FINDINGS: RIGHT LOWER EXTREMITY Common Femoral Vein: No evidence of thrombus. Normal compressibility, respiratory phasicity and response to augmentation. Saphenofemoral Junction: No evidence of thrombus. Normal compressibility and flow on color Doppler imaging. Profunda Femoral Vein: No evidence of thrombus. Normal compressibility and flow on color Doppler imaging. Femoral Vein: No evidence of thrombus. Normal compressibility, respiratory phasicity and response to augmentation. Popliteal Vein: No evidence of thrombus. Normal compressibility, respiratory phasicity and response to augmentation. Calf Veins: No evidence of thrombus. Normal compressibility and flow on color Doppler imaging. LEFT LOWER EXTREMITY Common Femoral Vein: No evidence of thrombus. Normal compressibility, respiratory phasicity and response to augmentation. Saphenofemoral Junction: No evidence of thrombus. Normal compressibility and flow on color Doppler imaging. Profunda Femoral Vein: No evidence of thrombus. Normal compressibility and flow on color Doppler imaging. Femoral Vein: No evidence of thrombus. Normal compressibility, respiratory phasicity and response to augmentation. Popliteal Vein: No evidence of thrombus. Normal compressibility, respiratory phasicity and response to augmentation. Calf Veins: No evidence of thrombus. Normal compressibility and flow on color Doppler imaging. IMPRESSION: No evidence of deep venous thrombosis in either lower extremity. Electronically Signed   By: Melven Stable.  Shick M.D.   On: 09/07/2023 14:06   DG Chest Port 1 View Result Date: 09/06/2023 CLINICAL DATA:  Acute respiratory failure with hypoxia. EXAM: PORTABLE CHEST 1 VIEW COMPARISON:  September 04, 2023. FINDINGS: The heart size and mediastinal contours are  within normal limits. Endotracheal and nasogastric tubes are unchanged. Stable bilateral interstitial densities are noted. The visualized skeletal structures are unremarkable. IMPRESSION: Stable support  apparatus.  Stable bilateral lung opacities. Electronically Signed   By: Rosalene Colon M.D.   On: 09/06/2023 08:51   DG Chest Port 1 View Result Date: 09/04/2023 CLINICAL DATA:  [id number] Endotracheally intubated [id number]; [id number] Encounter for orogastric (OG) tube placement [id number] EXAM: PORTABLE CHEST and abdomen 1 VIEW COMPARISON:  Chest x-ray 08/31/2023, CT angio chest 09/01/2023 FINDINGS: Endotracheal tube with tip terminating 1 cm above the carina. Enteric tube courses below the hemidiaphragm with tip and side port overlying the expected region of the gastric lumen. The heart and mediastinal contours are unchanged. Atherosclerotic plaque. Interval worsening of bilateral interstitial markings. Bilateral trace pleural effusion. No pneumothorax. No acute osseous abnormality. IMPRESSION: 1. Endotracheal tube with tip terminating 1 cm above the carina. Recommend retraction by 1 cm. 2. Enteric tube in good position. 3. Interval worsening of bilateral interstitial markings. Bilateral trace pleural effusion. Suggestive of pneumonia versus pulmonary edema. 4. Nonobstructive bowel gas pattern. 5.  Aortic Atherosclerosis (ICD10-I70.0). Electronically Signed   By: Morgane  Naveau M.D.   On: 09/04/2023 19:47   DG Abd 1 View Result Date: 09/04/2023 CLINICAL DATA:  [id number] Endotracheally intubated [id number]; [id number] Encounter for orogastric (OG) tube placement [id number] EXAM: PORTABLE CHEST and abdomen 1 VIEW COMPARISON:  Chest x-ray 08/31/2023, CT angio chest 09/01/2023 FINDINGS: Endotracheal tube with tip terminating 1 cm above the carina. Enteric tube courses below the hemidiaphragm with tip and side port overlying the expected region of the gastric lumen. The heart and mediastinal contours are unchanged. Atherosclerotic  plaque. Interval worsening of bilateral interstitial markings. Bilateral trace pleural effusion. No pneumothorax. No acute osseous abnormality. IMPRESSION: 1. Endotracheal tube with tip terminating 1 cm above the carina. Recommend retraction by 1 cm. 2. Enteric tube in good position. 3. Interval worsening of bilateral interstitial markings. Bilateral trace pleural effusion. Suggestive of pneumonia versus pulmonary edema. 4. Nonobstructive bowel gas pattern. 5.  Aortic Atherosclerosis (ICD10-I70.0). Electronically Signed   By: Morgane  Naveau M.D.   On: 09/04/2023 19:47   ECHOCARDIOGRAM COMPLETE Result Date: 09/03/2023    ECHOCARDIOGRAM REPORT   Patient Name:   TAJ BENARD Date of Exam: 09/02/2023 Medical Rec #:  [id number]         Height:       68.0 in Accession #:    [id number]        Weight:       164.5 lb Date of Birth:  [birth year]         BSA:          1.881 m Patient Age:    68 years          BP:           141/67 mmHg Patient Gender: M                 HR:           99 bpm. Exam Location:  [facility] Procedure: 2D Echo, Cardiac Doppler and Color Doppler (Both Spectral and Color            Flow Doppler were utilized during procedure). Indications:     Abnormal ECG R94.31  History:         Patient has no prior history of Echocardiogram examinations.                  CHF; COPD.  Sonographer:     Broadus Canes Referring Phys:  [id number] SUMAYYA AMIN Diagnosing Phys: Joetta Mustache  Sonographer Comments: Technically challenging study due to limited  acoustic windows, no apical window and no subcostal window. IMPRESSIONS  1. Technically very difficult study with limited window. No apical or subcoastal window.  2. Left ventricle incompletely visualized. Grossly appears normal in size with LVEF around 45-50% based on limited parasternal views. Cannot accurately assess LVEF or wall motion on this study. There is mild left ventricular hypertrophy. Left ventricular diastolic parameters are indeterminate.  3. Right ventricular  systolic function was not well visualized. The right ventricular size is not well visualized.  4. The mitral valve is normal in structure. Trivial mitral valve regurgitation.  5. The aortic valve is tricuspid. Aortic valve regurgitation is not visualized. FINDINGS  Left Ventricle: Left ventricle incompletely visualized. Grossly appears normal in size with LVEF around 45-50% based on limited parasternal views. Cannot accurately assess LVEF or wall motion on this study. There is mild left ventricular hypertrophy. Left ventricular diastolic parameters are indeterminate. Right Ventricle: The right ventricular size is not well visualized. Right vetricular wall thickness was not well visualized. Right ventricular systolic function was not well visualized. Left Atrium: Left atrial size was not well visualized. Right Atrium: Right atrial size was not well visualized. Pericardium: There is no evidence of pericardial effusion. Mitral Valve: The mitral valve is normal in structure. Mild mitral annular calcification. Trivial mitral valve regurgitation. Tricuspid Valve: The tricuspid valve is normal in structure. Aortic Valve: The aortic valve is tricuspid. Aortic valve regurgitation is not visualized. Pulmonic Valve: The pulmonic valve was not well visualized. Pulmonic valve regurgitation is trivial. Aorta: Aortic root could not be assessed. IAS/Shunts: The interatrial septum was not well visualized.  LEFT VENTRICLE PLAX 2D LVIDd:         3.90 cm LVIDs:         2.50 cm LV PW:         1.00 cm LV IVS:        1.30 cm LVOT diam:     2.20 cm LVOT Area:     3.80 cm  LEFT ATRIUM         Index LA diam:    2.40 cm 1.28 cm/m   AORTA Ao Root diam: 3.50 cm TRICUSPID VALVE TR Peak grad:   10.8 mmHg TR Vmax:        164.00 cm/s  SHUNTS Systemic Diam: 2.20 cm Joetta Mustache Electronically signed by Joetta Mustache Signature Date/Time: 09/03/2023/1:56:21 PM    Final    CT Angio Chest Pulmonary Embolism (PE) W or WO Contrast Result Date:  09/01/2023 CLINICAL DATA:  Pulmonary embolism suspected, low to intermediate probability. Positive D-dimer. Respiratory distress. EXAM: CT ANGIOGRAPHY CHEST WITH CONTRAST TECHNIQUE: Multidetector CT imaging of the chest was performed using the standard protocol during bolus administration of intravenous contrast. Multiplanar CT image reconstructions and MIPs were obtained to evaluate the vascular anatomy. RADIATION DOSE REDUCTION: This exam was performed according to the departmental dose-optimization program which includes automated exposure control, adjustment of the mA and/or kV according to patient size and/or use of iterative reconstruction technique. CONTRAST:  75mL OMNIPAQUE  IOHEXOL  350 MG/ML SOLN COMPARISON:  One-view chest x-ray 08/31/2023. FINDINGS: Cardiovascular: The heart size is normal. Atherosclerotic calcifications are present in the aortic arch. Calcifications are present great vessel origins without definite stenosis. No aneurysm or dissection is present. Pulmonary artery opacification is satisfactory. A focal embolus is present within the right superior pulmonary artery. No other focal emboli are present. Mediastinum/Nodes: No enlarged mediastinal, hilar, or axillary lymph nodes. Thyroid gland, trachea, right paratracheal lymph nodes measure up to 9  mm in short access. Subcarinal node measures 14 mm. Lungs/Pleura: Centrilobular emphysematous changes are present. Patchy airspace opacities are present bilaterally. Consolidative changes are present posteriorly in the left upper lobe. Upper Abdomen: 3.3 cm simple cyst is present at the upper pole of the left kidney. No other solid organ lesions are present within the visualized upper abdomen. Musculoskeletal: No chest wall abnormality. No acute or significant osseous findings. Review of the MIP images confirms the above findings. IMPRESSION: 1. Pulmonary embolus within the right superior pulmonary artery. 2. Patchy airspace opacities bilaterally with  consolidative changes posteriorly in the left upper lobe. Findings are compatible with multifocal pneumonia. 3. Mild mediastinal adenopathy, likely reactive. Critical Value/emergent results were called by telephone at the time of interpretation on 09/01/2023 at 10:28 am to provider SUMAYYA AMIN , who verbally acknowledged these results. Electronically Signed   By: Audree Leas M.D.   On: 09/01/2023 10:28   DG Chest Port 1 View Result Date: 08/31/2023 CLINICAL DATA:  Shortness of breath EXAM: PORTABLE CHEST 1 VIEW COMPARISON:  08/15/2023, 08/03/2023, 06/30/2021 FINDINGS: Improved aeration at the right apex but new left perihilar and right basilar opacities compared to prior. Stable cardiomediastinal silhouette. No pneumothorax. IMPRESSION: Improved aeration at the right apex but new left perihilar and right basilar opacities compared to prior, suspicious for bilateral pneumonia. Electronically Signed   By: Esmeralda Hedge M.D.   On: 08/31/2023 15:31    Microbiology: No results found for this or any previous visit (from the past 240 hours).  Time spent: 35 minutes  Signed: Brenna Cam, MD 09/19/2023

## 2023-10-13 NOTE — Progress Notes (Signed)
 Spoke with Debara Faden at DSS.

## 2023-10-13 NOTE — TOC Transition Note (Signed)
 Transition of Care [facility]) - Discharge Note   Patient Details  Name: Paul Vaughan MRN: [id number] Date of Birth: 1955/08/07  Transition of [facility] CM/SW Contact:  Loman Risk, RN Phone Number: 09/25/2023, 8:54 AM   Clinical Narrative:     Received notification from Gena at [facility] that they received completed DSS packet and patient can return today with hospice  Update:  Notified by RN that patient has expired.  Time of death 845.  Voicemail left for [employer] at DSS.  Alexander Iba at [facility] notified   Final next level of care: Skilled Nursing Facility Barriers to Discharge: Continued Medical Work up   Patient Goals and CMS Choice            Discharge Placement                       Discharge Plan and Services Additional resources added to the After Visit Summary for     Discharge Planning Services: CM Consult Post Acute Care Choice: Skilled Nursing Facility                               Social Drivers of Health (SDOH) Interventions SDOH Screenings   Food Insecurity: Patient Unable To Answer (08/31/2023)  Housing: Patient Unable To Answer (09/01/2023)  Transportation Needs: Patient Unable To Answer (08/31/2023)  Utilities: Patient Unable To Answer (08/31/2023)  Social Connections: Patient Unable To Answer (08/31/2023)  Tobacco Use: High Risk (08/31/2023)     Readmission Risk Interventions    07/01/2021    1:02 PM  Readmission Risk Prevention Plan  Transportation Screening Complete  PCP or Specialist Appt within 5-7 Days Complete  Home Care Screening Complete  Medication Review (RN CM) Complete

## 2023-10-13 DEATH — deceased

## 2024-01-11 IMAGING — DX DG CHEST 1V PORT
1 series · 1 of 1 positions shown · non-contrast
Comparison: Prior chest radiographs 12/13/2019 and earlier.

CLINICAL DATA: Provided history: Questionable sepsis-evaluate for
abnormality. Additional history provided: Increasing weakness and
gait difficulty. History of dementia.

EXAM:
PORTABLE CHEST 1 VIEW

[chest ap]
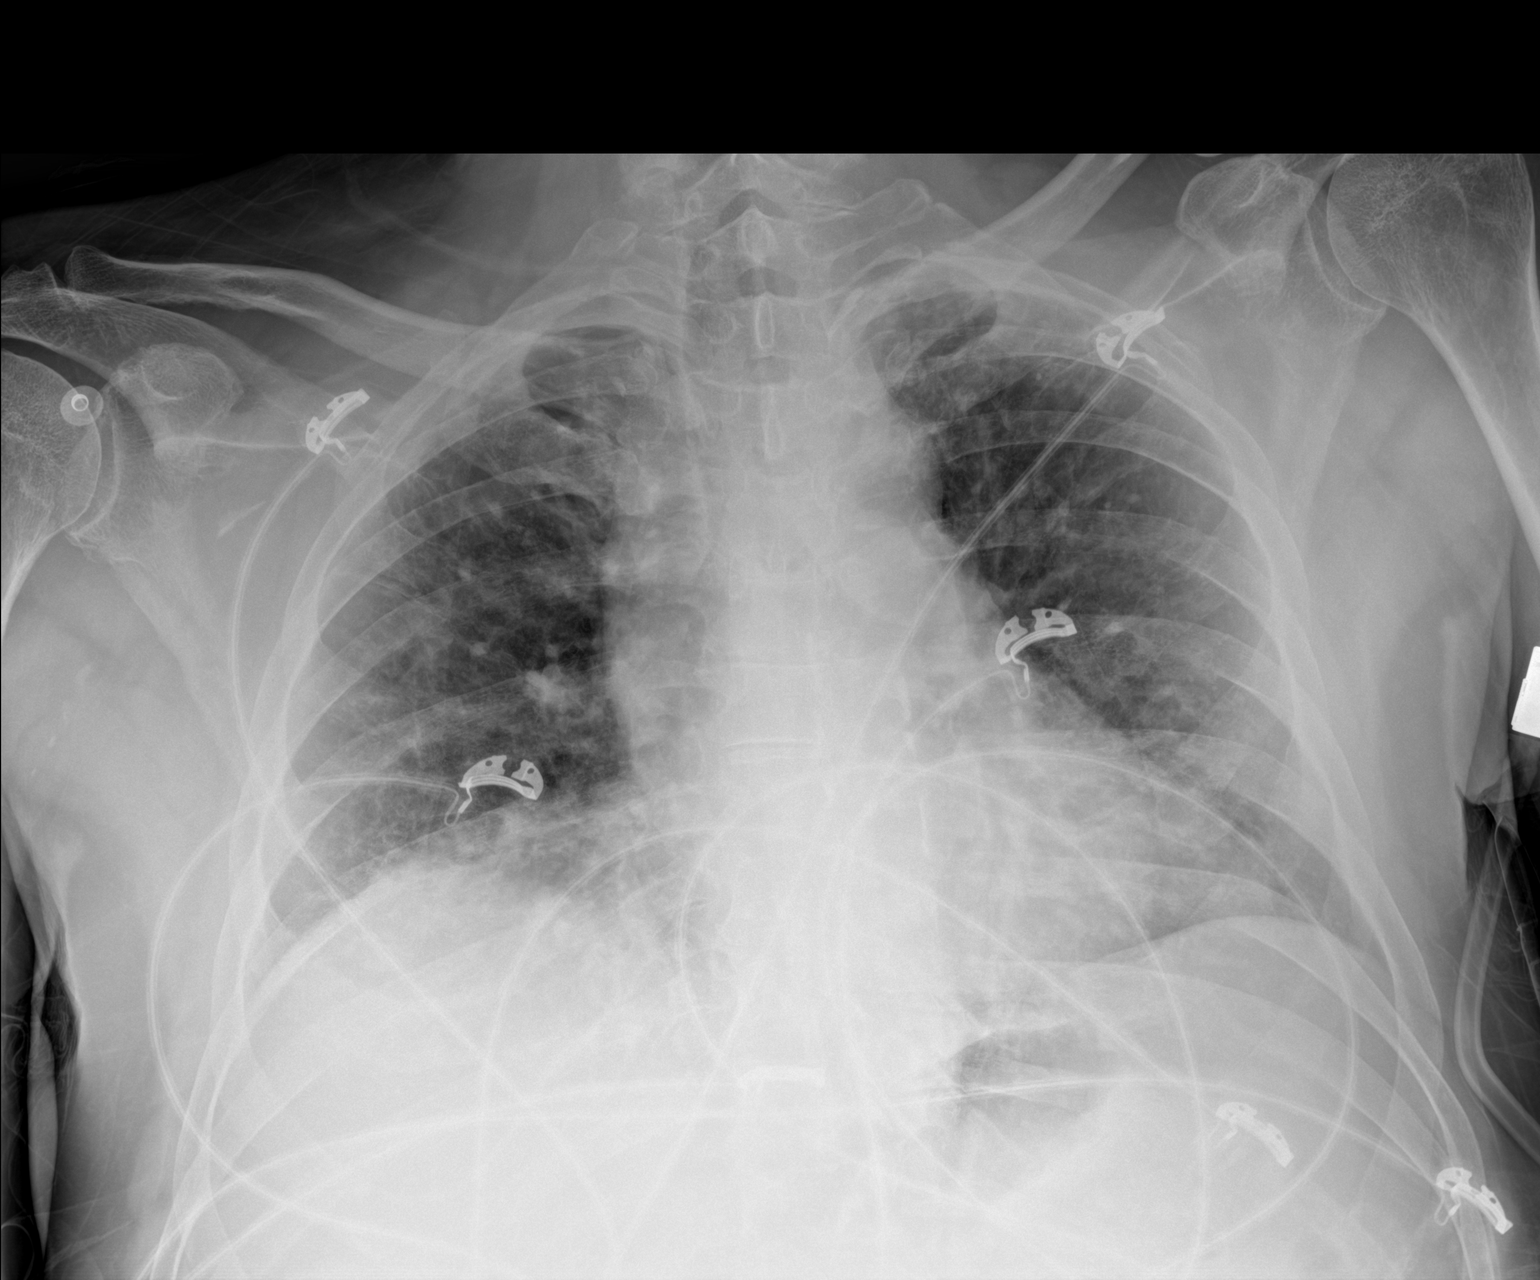

[1 of 1 positions shown; findings below may reference images not displayed]

FINDINGS: Shallow inspiration radiograph. Heart size within normal limits.
Aortic atherosclerosis. Mild elevation of the right hemidiaphragm.
Ill-defined opacity within the medial right lung base. No
appreciable airspace consolidation within the left lung. No evidence
of pleural effusion or pneumothorax. No acute bony abnormality
identified.
IMPRESSION: Shallow inspiration radiograph.

Ill-defined opacity within the medial right lung base, which may
reflect atelectasis or pneumonia.

Mild elevation of the right hemidiaphragm, new from the prior chest
radiograph of 12/13/2019.

Aortic Atherosclerosis ([id number]).
